# Patient Record
Sex: Female | Born: 1945
Health system: Southern US, Community
[De-identification: ages and names within clinical notes are randomized; demographics above are authoritative.]

## PROBLEM LIST (undated history)

## (undated) DIAGNOSIS — K649 Unspecified hemorrhoids: Secondary | ICD-10-CM

## (undated) DIAGNOSIS — T148XXA Other injury of unspecified body region, initial encounter: Secondary | ICD-10-CM

## (undated) DIAGNOSIS — M549 Dorsalgia, unspecified: Secondary | ICD-10-CM

## (undated) DIAGNOSIS — I10 Essential (primary) hypertension: Secondary | ICD-10-CM

## (undated) DIAGNOSIS — I639 Cerebral infarction, unspecified: Secondary | ICD-10-CM

## (undated) DIAGNOSIS — Z9889 Other specified postprocedural states: Secondary | ICD-10-CM

## (undated) DIAGNOSIS — E785 Hyperlipidemia, unspecified: Secondary | ICD-10-CM

## (undated) DIAGNOSIS — I959 Hypotension, unspecified: Secondary | ICD-10-CM

## (undated) DIAGNOSIS — R52 Pain, unspecified: Secondary | ICD-10-CM

## (undated) DIAGNOSIS — Z9289 Personal history of other medical treatment: Secondary | ICD-10-CM

## (undated) DIAGNOSIS — Z950 Presence of cardiac pacemaker: Secondary | ICD-10-CM

## (undated) DIAGNOSIS — S42309A Unspecified fracture of shaft of humerus, unspecified arm, initial encounter for closed fracture: Secondary | ICD-10-CM

## (undated) DIAGNOSIS — K8020 Calculus of gallbladder without cholecystitis without obstruction: Secondary | ICD-10-CM

## (undated) DIAGNOSIS — O24419 Gestational diabetes mellitus in pregnancy, unspecified control: Secondary | ICD-10-CM

## (undated) DIAGNOSIS — I447 Left bundle-branch block, unspecified: Secondary | ICD-10-CM

## (undated) HISTORY — DX: Hyperlipidemia, unspecified: E78.5

## (undated) HISTORY — DX: Unspecified hemorrhoids: K64.9

## (undated) HISTORY — PX: GALLBLADDER SURGERY: SHX652

## (undated) HISTORY — PX: COLONOSCOPY: SHX174

## (undated) HISTORY — DX: Essential (primary) hypertension: I10

## (undated) HISTORY — DX: Calculus of gallbladder without cholecystitis without obstruction: K80.20

---

## 2005-03-24 ENCOUNTER — Ambulatory Visit: Payer: Self-pay | Admitting: Obstetrics and Gynecology

## 2005-03-24 ENCOUNTER — Other Ambulatory Visit: Payer: Self-pay

## 2005-03-25 ENCOUNTER — Ambulatory Visit: Payer: Self-pay | Admitting: Obstetrics and Gynecology

## 2005-03-31 ENCOUNTER — Ambulatory Visit: Payer: Self-pay

## 2010-12-11 ENCOUNTER — Emergency Department (HOSPITAL_COMMUNITY)
Admission: EM | Admit: 2010-12-11 | Discharge: 2010-12-11 | Disposition: A | Discharge status: Home / Self Care | Payer: Medicare Other | Attending: Emergency Medicine | Admitting: Emergency Medicine

## 2010-12-11 DIAGNOSIS — I10 Essential (primary) hypertension: Secondary | ICD-10-CM | POA: Insufficient documentation

## 2010-12-11 DIAGNOSIS — H9209 Otalgia, unspecified ear: Secondary | ICD-10-CM | POA: Insufficient documentation

## 2010-12-11 DIAGNOSIS — K089 Disorder of teeth and supporting structures, unspecified: Secondary | ICD-10-CM | POA: Insufficient documentation

## 2013-05-30 ENCOUNTER — Ambulatory Visit (INDEPENDENT_AMBULATORY_CARE_PROVIDER_SITE_OTHER): Payer: Commercial Managed Care - HMO | Admitting: Cardiovascular Disease

## 2013-05-30 ENCOUNTER — Encounter: Payer: Self-pay | Admitting: Cardiovascular Disease

## 2013-05-30 VITALS — BP 132/78 | HR 74 | Ht 62.0 in | Wt 189.8 lb

## 2013-05-30 DIAGNOSIS — R079 Chest pain, unspecified: Secondary | ICD-10-CM | POA: Insufficient documentation

## 2013-05-30 DIAGNOSIS — R9431 Abnormal electrocardiogram [ECG] [EKG]: Secondary | ICD-10-CM | POA: Insufficient documentation

## 2013-05-30 DIAGNOSIS — R011 Cardiac murmur, unspecified: Secondary | ICD-10-CM

## 2013-05-30 DIAGNOSIS — I447 Left bundle-branch block, unspecified: Secondary | ICD-10-CM

## 2013-05-30 DIAGNOSIS — E785 Hyperlipidemia, unspecified: Secondary | ICD-10-CM

## 2013-05-30 DIAGNOSIS — I10 Essential (primary) hypertension: Secondary | ICD-10-CM | POA: Insufficient documentation

## 2013-05-30 NOTE — Patient Instructions (Addendum)
You are doing well. No medication changes were made.  We will schedule a lexiscan myoview for abn EKG and chest pain. Hold amlodipine the night before and morning of the test No food the morning of the test  Please call us if you have new issues that need to be addressed before your next appt.  We will call you with the results  [facility] MYOVIEW  Your caregiver has ordered a Stress Test with nuclear imaging. The purpose of this test is to evaluate the blood supply to your heart muscle. This procedure is referred to as a "Non-Invasive Stress Test." This is because other than having an IV started in your vein, nothing is inserted or "invades" your body. Cardiac stress tests are done to find areas of poor blood flow to the heart by determining the extent of coronary artery disease (CAD). Some patients exercise on a treadmill, which naturally increases the blood flow to your heart, while others who are  unable to walk on a treadmill due to physical limitations have a pharmacologic/chemical stress agent called Lexiscan . This medicine will mimic walking on a treadmill by temporarily increasing your coronary blood flow.   Please note: these test may take anywhere between 2-4 hours to complete  PLEASE REPORT TO [facility]TR[facility]  THE VOLUNTEERS AT THE FIRST DESK WILL DIRECT YOU WHERE TO GO  Date of Procedure:___________Tuesday, Nov 25__________________________  Arrival Time for Procedure:__________7:30____________________  Hold amlodipine the night before and morning of the test  PLEASE NOTIFY THE OFFICE AT LEAST 24 HOURS IN ADVANCE IF YOU ARE UNABLE TO KEEP YOUR APPOINTMENT.  [phone] AND  PLEASE NOTIFY NUCLEAR MEDICINE AT [facility] AT LEAST 24 HOURS IN ADVANCE IF YOU ARE UNABLE TO KEEP YOUR APPOINTMENT. [phone]  How to prepare for your Myoview test:  1. Do not eat or drink after midnight 2. No caffeine for 24 hours prior to test 3. No smoking 24 hours prior to test. 4. Your  medication may be taken with water.  If your doctor stopped a medication because of this test, do not take that medication. 5. Ladies, please do not wear dresses.  Skirts or pants are appropriate. Please wear a short sleeve shirt. 6. No perfume, cologne or lotion. 7. Wear comfortable walking shoes. No heels!

## 2013-05-30 NOTE — Progress Notes (Signed)
Patient ID: Elaine Ford, female    DOB: [birth year], 67 y.o.   MRN: [id number]  HPI Comments: Ms Ecklund is a pleasant 67 year old woman with history of hypertension, strong family history of CAD, presenting by referral for symptoms of chest pain.  She reports that for the past several months, she has been waking at nighttime with left-sided chest pain. It is a deep, burning pain lasting for several minutes at a time. Pain is 6/10 in intensity. Sometimes symptoms occurred during the daytime. Predominantly at night. She has a recliner/hospital bed. Sometimes if she lays flat, rolls on her side symptoms might have some relief. She's not particularly active at baseline but able to do her ADLs. No PND or orthopnea. No lightheadedness or dizziness.  She denies any smoking history, no diabetes She reports her father had coronary disease and bypass in his 22s, brother also with CAD  EKG done by Dr. Lahoma Rocker shows normal sinus rhythm with rate 72 beats per minute, left bundle branch block EKG today shows normal sinus rhythm with rate 74 beats per minute, intraventricular conduction delay, ST and T wave abnormality Prior EKG in September 2006 was essentially normal, narrow complex rhythm   Outpatient Encounter Prescriptions as of 05/30/2013  Medication Sig  . amLODipine (NORVASC) 5 MG tablet Take 5 mg by mouth daily.  Marland Kitchen aspirin 81 MG tablet Take 81 mg by mouth daily.  Marland Kitchen losartan-hydrochlorothiazide (HYZAAR) 100-25 MG per tablet Take 1 tablet by mouth daily.  Marland Kitchen lovastatin (MEVACOR) 20 MG tablet Take 20 mg by mouth at bedtime.  . nitroGLYCERIN (NITROSTAT) 0.4 MG SL tablet Place 0.4 mg under the tongue every 5 (five) minutes as needed for chest pain.  Marland Kitchen nystatin cream (MYCOSTATIN) Apply 1 application topically as needed.   Marland Kitchen PROCTOSOL HC 2.5 % rectal cream Place 1 application rectally as needed.      Review of Systems  Constitutional: Negative.   HENT: Negative.   Eyes: Negative.   Respiratory:  Negative.   Cardiovascular: Positive for chest pain.  Gastrointestinal: Negative.   Endocrine: Negative.   Musculoskeletal: Negative.   Skin: Negative.   Allergic/Immunologic: Negative.   Neurological: Negative.   Hematological: Negative.   Psychiatric/Behavioral: Negative.   All other systems reviewed and are negative.    BP 132/78  Pulse 74  Ht 5\' 2"  (1.575 m)  Wt 189 lb 12 oz (86.07 kg)  BMI 34.70 kg/m2  Physical Exam  Nursing note and vitals reviewed. Constitutional: She is oriented to person, place, and time. She appears well-developed and well-nourished.  HENT:  Head: Normocephalic.  Nose: Nose normal.  Mouth/Throat: Oropharynx is clear and moist.  Eyes: Conjunctivae are normal. Pupils are equal, round, and reactive to light.  Neck: Normal range of motion. Neck supple. No JVD present.  Cardiovascular: Normal rate, regular rhythm, S1 normal, S2 normal and intact distal pulses.  Exam reveals no gallop and no friction rub.   Murmur heard.  Systolic murmur is present with a grade of 2/6  Pulmonary/Chest: Effort normal and breath sounds normal. No respiratory distress. She has no wheezes. She has no rales. She exhibits no tenderness.  Abdominal: Soft. Bowel sounds are normal. She exhibits no distension. There is no tenderness.  Musculoskeletal: Normal range of motion. She exhibits no edema and no tenderness.  Lymphadenopathy:    She has no cervical adenopathy.  Neurological: She is alert and oriented to person, place, and time. Coordination normal.  Skin: Skin is warm and dry. No rash noted. No  erythema.  Psychiatric: She has a normal mood and affect. Her behavior is normal. Judgment and thought content normal.    Assessment and Plan

## 2013-05-30 NOTE — Assessment & Plan Note (Signed)
Encouraged her to stay on her statin 

## 2013-05-30 NOTE — Assessment & Plan Note (Signed)
Etiology of her chest pain is unclear. She's not a good candidate for treadmill testing given underlying intraventricular conduction delay/left bundle branch block seen on EKG. Given this, pharmacologic Myoview will be ordered

## 2013-05-30 NOTE — Assessment & Plan Note (Signed)
Blood pressure is well controlled on today's visit. No changes made to the medications. 

## 2013-05-30 NOTE — Assessment & Plan Note (Signed)
EKG a primary care showed up on the right block. Our EKG today shows intraventricular conduction delay.

## 2013-05-30 NOTE — Assessment & Plan Note (Signed)
Suspect she has underlying aortic valve sclerosis. Would monitor for now. If murmur gets louder, echocardiogram could be ordered

## 2013-06-04 ENCOUNTER — Ambulatory Visit: Payer: Self-pay | Admitting: Cardiovascular Disease

## 2013-06-04 DIAGNOSIS — R079 Chest pain, unspecified: Secondary | ICD-10-CM

## 2013-06-14 ENCOUNTER — Telehealth: Payer: Self-pay

## 2013-06-14 NOTE — Telephone Encounter (Signed)
Message copied by Marilynne Halsted on Fri Jun 14, 2013 11:07 AM ------      Message from: Antonieta Iba      Created: Thu Jun 13, 2013 11:14 PM       Normal stress test ------

## 2013-06-14 NOTE — Telephone Encounter (Signed)
Left detailed message on pt's voicemail w/ results. Asked pt to call with any questions or concerns.

## 2013-06-26 ENCOUNTER — Encounter: Payer: Self-pay | Admitting: *Deleted

## 2014-01-22 ENCOUNTER — Ambulatory Visit (INDEPENDENT_AMBULATORY_CARE_PROVIDER_SITE_OTHER): Payer: Commercial Managed Care - HMO | Admitting: Cardiovascular Disease

## 2014-01-22 ENCOUNTER — Encounter: Payer: Self-pay | Admitting: Cardiovascular Disease

## 2014-01-22 VITALS — BP 140/80 | HR 59 | Ht 64.0 in | Wt 193.2 lb

## 2014-01-22 DIAGNOSIS — E669 Obesity, unspecified: Secondary | ICD-10-CM

## 2014-01-22 DIAGNOSIS — E785 Hyperlipidemia, unspecified: Secondary | ICD-10-CM

## 2014-01-22 DIAGNOSIS — I10 Essential (primary) hypertension: Secondary | ICD-10-CM | POA: Diagnosis not present

## 2014-01-22 DIAGNOSIS — R011 Cardiac murmur, unspecified: Secondary | ICD-10-CM | POA: Diagnosis not present

## 2014-01-22 DIAGNOSIS — I447 Left bundle-branch block, unspecified: Secondary | ICD-10-CM | POA: Diagnosis not present

## 2014-01-22 NOTE — Assessment & Plan Note (Signed)
Currently not on lovastatin. Recommended she follow up with primary care with annual lab work

## 2014-01-22 NOTE — Progress Notes (Addendum)
   Patient ID: Elaine Ford, female    DOB: 1946-01-21, 68 y.o.   MRN: [id number]  HPI Comments: Elaine Ford is a pleasant 68 year old woman with history of hypertension, obesity, strong family history of CAD, previous symptoms of chest pain who presents for routine followup.  In followup today, she reports that many of her medications have been changed, but she is taking atenolol 25 mg daily. Several of the medications were held secondary to headache .She was previously on amlodipine and losartan HCTZ. Also on lovastatin. Now no longer taking these. She does not check her blood pressure. She tries to stay active, but does not do any regular exercise. Weight has been a major issue. She denies any significant chest pain or shortness of breath with exertion. Overall she feels well  No PND or orthopnea. No lightheadedness or dizziness.  She denies smoking.  She reports her father had coronary disease and bypass in his 97s, brother also with CAD  EKG today shows normal sinus rhythm with rate 59 beats per minute, left bundle branch block    Outpatient Encounter Prescriptions as of 01/22/2014  Medication Sig  . aspirin 81 MG tablet Take 81 mg by mouth daily.  Marland Kitchen atenolol (TENORMIN) 25 MG tablet Take 25 mg by mouth daily.   . nitroGLYCERIN (NITROSTAT) 0.4 MG SL tablet Place 0.4 mg under the tongue every 5 (five) minutes as needed for chest pain.    Review of Systems  Constitutional: Negative.   HENT: Negative.   Eyes: Negative.   Respiratory: Negative.   Cardiovascular: Negative.   Gastrointestinal: Negative.   Endocrine: Negative.   Musculoskeletal: Negative.   Skin: Negative.   Allergic/Immunologic: Negative.   Neurological: Negative.   Hematological: Negative.   Psychiatric/Behavioral: Negative.   All other systems reviewed and are negative.   BP 140/80  Pulse 59  Ht 5\' 4"  (1.626 m)  Wt 193 lb 4 oz (87.658 kg)  BMI 33.16 kg/m2  Physical Exam  Nursing note and vitals  reviewed. Constitutional: She is oriented to person, place, and time. She appears well-developed and well-nourished.  HENT:  Head: Normocephalic.  Nose: Nose normal.  Mouth/Throat: Oropharynx is clear and moist.  Eyes: Conjunctivae are normal. Pupils are equal, round, and reactive to light.  Neck: Normal range of motion. Neck supple. No JVD present.  Cardiovascular: Normal rate, regular rhythm, S1 normal, S2 normal and intact distal pulses.  Exam reveals no gallop and no friction rub.   Murmur heard.  Systolic murmur is present with a grade of 2/6  Pulmonary/Chest: Effort normal and breath sounds normal. No respiratory distress. She has no wheezes. She has no rales. She exhibits no tenderness.  Abdominal: Soft. Bowel sounds are normal. She exhibits no distension. There is no tenderness.  Musculoskeletal: Normal range of motion. She exhibits no edema and no tenderness.  Lymphadenopathy:    She has no cervical adenopathy.  Neurological: She is alert and oriented to person, place, and time. Coordination normal.  Skin: Skin is warm and dry. No rash noted. No erythema.  Psychiatric: She has a normal mood and affect. Her behavior is normal. Judgment and thought content normal.    Assessment and Plan

## 2014-01-22 NOTE — Assessment & Plan Note (Signed)
Likely has aortic valve sclerosis. If murmur gets louder, we'll order echocardiogram

## 2014-01-22 NOTE — Patient Instructions (Signed)
You are doing well. No medication changes were made.  Please start a walking program   Please call us if you have new issues that need to be addressed before your next appt.  Your physician wants you to follow-up in: 75months.  You will receive a reminder letter in the mail two months in advance. If you don't receive a letter, please call our office to schedule the follow-up appointment.

## 2014-01-22 NOTE — Assessment & Plan Note (Signed)
Prior stress test at the end of 2014 showing no ischemia. No further workup needed at this time

## 2014-01-22 NOTE — Assessment & Plan Note (Signed)
Recommended she closely monitor her diet, start a regular walking program. She reports that she has access to a gym membership

## 2014-01-22 NOTE — Assessment & Plan Note (Addendum)
Blood pressure borderline elevated. We have suggested she start to monitor her blood pressure closely at home and call our office if this runs high. Parameters given for blood pressure control. Recommended diet and exercise

## 2014-07-11 DIAGNOSIS — Z950 Presence of cardiac pacemaker: Secondary | ICD-10-CM

## 2014-07-11 HISTORY — DX: Presence of cardiac pacemaker: Z95.0

## 2014-07-22 ENCOUNTER — Ambulatory Visit: Payer: Self-pay

## 2014-07-22 ENCOUNTER — Inpatient Hospital Stay: Payer: Self-pay | Admitting: Internal Medicine

## 2014-07-22 DIAGNOSIS — I252 Old myocardial infarction: Secondary | ICD-10-CM | POA: Diagnosis not present

## 2014-07-22 DIAGNOSIS — R740 Nonspecific elevation of levels of transaminase and lactic acid dehydrogenase [LDH]: Secondary | ICD-10-CM | POA: Diagnosis not present

## 2014-07-22 DIAGNOSIS — I447 Left bundle-branch block, unspecified: Secondary | ICD-10-CM | POA: Diagnosis not present

## 2014-07-22 DIAGNOSIS — R079 Chest pain, unspecified: Secondary | ICD-10-CM | POA: Diagnosis not present

## 2014-07-22 DIAGNOSIS — I442 Atrioventricular block, complete: Secondary | ICD-10-CM | POA: Diagnosis not present

## 2014-07-22 DIAGNOSIS — R0602 Shortness of breath: Secondary | ICD-10-CM | POA: Diagnosis not present

## 2014-07-22 DIAGNOSIS — I10 Essential (primary) hypertension: Secondary | ICD-10-CM | POA: Diagnosis not present

## 2014-07-22 DIAGNOSIS — F419 Anxiety disorder, unspecified: Secondary | ICD-10-CM | POA: Diagnosis not present

## 2014-07-22 DIAGNOSIS — R001 Bradycardia, unspecified: Secondary | ICD-10-CM | POA: Diagnosis not present

## 2014-07-22 DIAGNOSIS — I351 Nonrheumatic aortic (valve) insufficiency: Secondary | ICD-10-CM | POA: Diagnosis not present

## 2014-07-22 DIAGNOSIS — Z7982 Long term (current) use of aspirin: Secondary | ICD-10-CM | POA: Diagnosis not present

## 2014-07-22 DIAGNOSIS — Z9049 Acquired absence of other specified parts of digestive tract: Secondary | ICD-10-CM | POA: Diagnosis not present

## 2014-07-22 DIAGNOSIS — M179 Osteoarthritis of knee, unspecified: Secondary | ICD-10-CM | POA: Diagnosis not present

## 2014-07-22 DIAGNOSIS — R0789 Other chest pain: Secondary | ICD-10-CM | POA: Diagnosis not present

## 2014-07-22 DIAGNOSIS — Z79899 Other long term (current) drug therapy: Secondary | ICD-10-CM | POA: Diagnosis not present

## 2014-07-22 DIAGNOSIS — R9431 Abnormal electrocardiogram [ECG] [EKG]: Secondary | ICD-10-CM | POA: Diagnosis not present

## 2014-07-22 LAB — CBC WITH DIFFERENTIAL/PLATELET
BASOS ABS: 0.1 10*3/uL (ref 0.0–0.1)
Basophil %: 0.6 %
Eosinophil #: 0 10*3/uL (ref 0.0–0.7)
Eosinophil %: 0.2 %
HCT: 43.2 % (ref 35.0–47.0)
HGB: 14 g/dL (ref 12.0–16.0)
LYMPHS ABS: 1.6 10*3/uL (ref 1.0–3.6)
LYMPHS PCT: 15.6 %
MCH: 27.5 pg (ref 26.0–34.0)
MCHC: 32.5 g/dL (ref 32.0–36.0)
MCV: 85 fL (ref 80–100)
MONO ABS: 0.5 x10 3/mm (ref 0.2–0.9)
Monocyte %: 5.2 %
Neutrophil #: 7.9 10*3/uL — ABNORMAL HIGH (ref 1.4–6.5)
Neutrophil %: 78.4 %
Platelet: 261 10*3/uL (ref 150–440)
RBC: 5.12 10*6/uL (ref 3.80–5.20)
RDW: 14.1 % (ref 11.5–14.5)
WBC: 10.1 10*3/uL (ref 3.6–11.0)

## 2014-07-22 LAB — LIPASE, BLOOD: Lipase: 62 U/L — ABNORMAL LOW (ref 73–393)

## 2014-07-22 LAB — COMPREHENSIVE METABOLIC PANEL
ALK PHOS: 83 U/L
ALT: 123 U/L — AB
Albumin: 3.1 g/dL — ABNORMAL LOW (ref 3.4–5.0)
Anion Gap: 11 (ref 7–16)
BILIRUBIN TOTAL: 0.7 mg/dL (ref 0.2–1.0)
BUN: 11 mg/dL (ref 7–18)
CHLORIDE: 105 mmol/L (ref 98–107)
Calcium, Total: 8.8 mg/dL (ref 8.5–10.1)
Co2: 22 mmol/L (ref 21–32)
Creatinine: 0.87 mg/dL (ref 0.60–1.30)
EGFR (African American): >60
EGFR (Non-African Amer.): >60
GLUCOSE: 119 mg/dL — AB (ref 65–99)
OSMOLALITY: 276 (ref 275–301)
Potassium: 4.4 mmol/L (ref 3.5–5.1)
SGOT(AST): 89 U/L — ABNORMAL HIGH (ref 15–37)
SODIUM: 138 mmol/L (ref 136–145)
TOTAL PROTEIN: 7.8 g/dL (ref 6.4–8.2)

## 2014-07-22 LAB — CK TOTAL AND CKMB (NOT AT ARMC)
CK, Total: 87 U/L (ref 26–192)
CK, Total: 51 U/L (ref 26–192)
CK-MB: 0.7 ng/mL (ref 0.5–3.6)
CK-MB: 0.7 ng/mL (ref 0.5–3.6)

## 2014-07-22 LAB — TROPONIN I: Troponin-I: <0.02 ng/mL

## 2014-07-22 LAB — URINALYSIS, COMPLETE
BILIRUBIN, UR: NEGATIVE
Blood: NEGATIVE
GLUCOSE, UR: NEGATIVE mg/dL (ref 0–75)
KETONE: NEGATIVE
Leukocyte Esterase: NEGATIVE
Nitrite: NEGATIVE
PROTEIN: NEGATIVE
Ph: 8 (ref 4.5–8.0)
Specific Gravity: 1.005 (ref 1.003–1.030)
Squamous Epithelial: 1

## 2014-07-22 LAB — APTT: ACTIVATED PTT: 31.7 s (ref 23.6–35.9)

## 2014-07-22 LAB — TSH: Thyroid Stimulating Horm: 3.43 u[IU]/mL

## 2014-07-22 LAB — MAGNESIUM: Magnesium: 2.1 mg/dL

## 2014-07-22 LAB — ETHANOL: Ethanol: <3 mg/dL

## 2014-07-22 LAB — HEMOGLOBIN A1C: Hemoglobin A1C: 5.6 % (ref 4.2–6.3)

## 2014-07-22 LAB — PROTIME-INR
INR: 1.1
PROTHROMBIN TIME: 13.5 s (ref 11.5–14.7)

## 2014-07-23 DIAGNOSIS — I10 Essential (primary) hypertension: Secondary | ICD-10-CM

## 2014-07-23 DIAGNOSIS — I351 Nonrheumatic aortic (valve) insufficiency: Secondary | ICD-10-CM

## 2014-07-23 DIAGNOSIS — R001 Bradycardia, unspecified: Secondary | ICD-10-CM

## 2014-07-23 LAB — BASIC METABOLIC PANEL
ANION GAP: 9 (ref 7–16)
BUN: 12 mg/dL (ref 7–18)
CALCIUM: 8.6 mg/dL (ref 8.5–10.1)
CHLORIDE: 106 mmol/L (ref 98–107)
CO2: 26 mmol/L (ref 21–32)
CREATININE: 0.87 mg/dL (ref 0.60–1.30)
EGFR (African American): >60
EGFR (Non-African Amer.): >60
Glucose: 87 mg/dL (ref 65–99)
OSMOLALITY: 280 (ref 275–301)
Potassium: 3.9 mmol/L (ref 3.5–5.1)
Sodium: 141 mmol/L (ref 136–145)

## 2014-07-23 LAB — CBC WITH DIFFERENTIAL/PLATELET
BASOS ABS: 0.1 10*3/uL (ref 0.0–0.1)
BASOS PCT: 0.6 %
EOS ABS: 0 10*3/uL (ref 0.0–0.7)
Eosinophil %: 0.5 %
HCT: 42.7 % (ref 35.0–47.0)
HGB: 13.6 g/dL (ref 12.0–16.0)
Lymphocyte #: 1.9 10*3/uL (ref 1.0–3.6)
Lymphocyte %: 21.5 %
MCH: 27.2 pg (ref 26.0–34.0)
MCHC: 31.9 g/dL — AB (ref 32.0–36.0)
MCV: 85 fL (ref 80–100)
Monocyte #: 0.5 x10 3/mm (ref 0.2–0.9)
Monocyte %: 6 %
NEUTROS PCT: 71.4 %
Neutrophil #: 6.4 10*3/uL (ref 1.4–6.5)
Platelet: 246 10*3/uL (ref 150–440)
RBC: 5.01 10*6/uL (ref 3.80–5.20)
RDW: 14.2 % (ref 11.5–14.5)
WBC: 8.9 10*3/uL (ref 3.6–11.0)

## 2014-07-23 LAB — TROPONIN I: Troponin-I: <0.02 ng/mL

## 2014-07-23 LAB — CK TOTAL AND CKMB (NOT AT ARMC)
CK, Total: 46 U/L (ref 26–192)
CK-MB: 0.7 ng/mL (ref 0.5–3.6)

## 2014-07-23 LAB — LIPID PANEL
Cholesterol: 147 mg/dL (ref 0–200)
HDL: 38 mg/dL — AB (ref 40–60)
Ldl Cholesterol, Calc: 84 mg/dL (ref 0–100)
Triglycerides: 127 mg/dL (ref 0–200)
VLDL Cholesterol, Calc: 25 mg/dL (ref 5–40)

## 2014-07-24 ENCOUNTER — Encounter (HOSPITAL_COMMUNITY)
Admission: EM | Disposition: A | Discharge status: Home / Self Care | Payer: Self-pay | Source: Other Acute Inpatient Hospital | Attending: Internal Medicine

## 2014-07-24 ENCOUNTER — Inpatient Hospital Stay (HOSPITAL_COMMUNITY)
Admission: EM | Admit: 2014-07-24 | Discharge: 2014-07-27 | DRG: 243 | Disposition: A | Discharge status: Home / Self Care | Payer: Commercial Managed Care - HMO | Source: Other Acute Inpatient Hospital | Attending: Internal Medicine | Admitting: Internal Medicine

## 2014-07-24 ENCOUNTER — Encounter (HOSPITAL_COMMUNITY): Payer: Self-pay | Admitting: *Deleted

## 2014-07-24 DIAGNOSIS — Z7982 Long term (current) use of aspirin: Secondary | ICD-10-CM

## 2014-07-24 DIAGNOSIS — E785 Hyperlipidemia, unspecified: Secondary | ICD-10-CM | POA: Diagnosis present

## 2014-07-24 DIAGNOSIS — T82120A Displacement of cardiac electrode, initial encounter: Secondary | ICD-10-CM | POA: Diagnosis not present

## 2014-07-24 DIAGNOSIS — I447 Left bundle-branch block, unspecified: Secondary | ICD-10-CM | POA: Diagnosis present

## 2014-07-24 DIAGNOSIS — I10 Essential (primary) hypertension: Secondary | ICD-10-CM | POA: Diagnosis not present

## 2014-07-24 DIAGNOSIS — I442 Atrioventricular block, complete: Secondary | ICD-10-CM | POA: Diagnosis not present

## 2014-07-24 DIAGNOSIS — Z6836 Body mass index (BMI) 36.0-36.9, adult: Secondary | ICD-10-CM | POA: Diagnosis not present

## 2014-07-24 DIAGNOSIS — R001 Bradycardia, unspecified: Secondary | ICD-10-CM | POA: Diagnosis not present

## 2014-07-24 DIAGNOSIS — Z8249 Family history of ischemic heart disease and other diseases of the circulatory system: Secondary | ICD-10-CM

## 2014-07-24 DIAGNOSIS — T82110A Breakdown (mechanical) of cardiac electrode, initial encounter: Secondary | ICD-10-CM | POA: Diagnosis not present

## 2014-07-24 DIAGNOSIS — Y838 Other surgical procedures as the cause of abnormal reaction of the patient, or of later complication, without mention of misadventure at the time of the procedure: Secondary | ICD-10-CM | POA: Diagnosis not present

## 2014-07-24 DIAGNOSIS — T82190A Other mechanical complication of cardiac electrode, initial encounter: Secondary | ICD-10-CM | POA: Diagnosis not present

## 2014-07-24 DIAGNOSIS — I209 Angina pectoris, unspecified: Secondary | ICD-10-CM

## 2014-07-24 DIAGNOSIS — Z452 Encounter for adjustment and management of vascular access device: Secondary | ICD-10-CM | POA: Diagnosis not present

## 2014-07-24 DIAGNOSIS — Z4502 Encounter for adjustment and management of automatic implantable cardiac defibrillator: Secondary | ICD-10-CM | POA: Diagnosis not present

## 2014-07-24 DIAGNOSIS — Z959 Presence of cardiac and vascular implant and graft, unspecified: Secondary | ICD-10-CM

## 2014-07-24 DIAGNOSIS — Z950 Presence of cardiac pacemaker: Secondary | ICD-10-CM

## 2014-07-24 HISTORY — DX: Left bundle-branch block, unspecified: I44.7

## 2014-07-24 HISTORY — DX: Personal history of other medical treatment: Z92.89

## 2014-07-24 HISTORY — DX: Other specified postprocedural states: Z98.890

## 2014-07-24 HISTORY — DX: Presence of cardiac pacemaker: Z95.0

## 2014-07-24 HISTORY — PX: PERMANENT PACEMAKER INSERTION: SHX5480

## 2014-07-24 LAB — CBC
HCT: 40.6 % (ref 36.0–46.0)
Hemoglobin: 13.5 g/dL (ref 12.0–15.0)
MCH: 27.5 pg (ref 26.0–34.0)
MCHC: 33.3 g/dL (ref 30.0–36.0)
MCV: 82.7 fL (ref 78.0–100.0)
Platelets: 247 10*3/uL (ref 150–400)
RBC: 4.91 MIL/uL (ref 3.87–5.11)
RDW: 13.9 % (ref 11.5–15.5)
WBC: 10.5 10*3/uL (ref 4.0–10.5)

## 2014-07-24 LAB — CREATININE, SERUM
CREATININE: 0.81 mg/dL (ref 0.50–1.10)
GFR calc Af Amer: 85 mL/min — ABNORMAL LOW (ref >90)
GFR, EST NON AFRICAN AMERICAN: 73 mL/min — AB (ref >90)

## 2014-07-24 LAB — MRSA PCR SCREENING: MRSA by PCR: NEGATIVE

## 2014-07-24 SURGERY — PERMANENT PACEMAKER INSERTION
Anesthesia: LOCAL

## 2014-07-24 MED ORDER — CHLORHEXIDINE GLUCONATE 4 % EX LIQD
60.0000 mL | Freq: Once | CUTANEOUS | Status: AC
  Administered 2014-07-24: 4 via TOPICAL

## 2014-07-24 MED ORDER — ONDANSETRON HCL 4 MG/2ML IJ SOLN
4.0000 mg | Freq: Four times a day (QID) | INTRAMUSCULAR | Status: DC | PRN
Start: 2014-07-24 — End: 2014-07-24

## 2014-07-24 MED ORDER — ONDANSETRON HCL 4 MG/2ML IJ SOLN: 4.0000 mg | Freq: Four times a day (QID) | INTRAMUSCULAR | Status: DC | PRN

## 2014-07-24 MED ORDER — SODIUM CHLORIDE 0.9 % IJ SOLN: 3.0000 mL | INTRAMUSCULAR | Status: DC | PRN

## 2014-07-24 MED ORDER — ASPIRIN EC 81 MG PO TBEC
81.0000 mg | DELAYED_RELEASE_TABLET | Freq: Every day | ORAL | Status: DC
  Administered 2014-07-25 – 2014-07-27 (×3): 81 mg via ORAL
  Filled 2014-07-24 (×3): qty 1

## 2014-07-24 MED ORDER — CEFAZOLIN SODIUM 1-5 GM-% IV SOLN
1.0000 g | Freq: Four times a day (QID) | INTRAVENOUS | Status: AC
  Administered 2014-07-24 – 2014-07-25 (×3): 1 g via INTRAVENOUS
  Filled 2014-07-24 (×3): qty 50

## 2014-07-24 MED ORDER — CEFAZOLIN SODIUM-DEXTROSE 2-3 GM-% IV SOLR
2.0000 g | INTRAVENOUS | Status: AC
  Administered 2014-07-24: 2 g via INTRAVENOUS

## 2014-07-24 MED ORDER — SODIUM CHLORIDE 0.9 % IV SOLN: 250.0000 mL | INTRAVENOUS | Status: DC | PRN

## 2014-07-24 MED ORDER — SODIUM CHLORIDE 0.9 % IR SOLN
80.0000 mg | Status: AC
  Administered 2014-07-24: 80 mg

## 2014-07-24 MED ORDER — NITROGLYCERIN 0.4 MG SL SUBL
0.4000 mg | SUBLINGUAL_TABLET | SUBLINGUAL | Status: DC | PRN
Start: 2014-07-24 — End: 2014-07-24

## 2014-07-24 MED ORDER — HEPARIN SODIUM (PORCINE) 5000 UNIT/ML IJ SOLN
5000.0000 [IU] | Freq: Three times a day (TID) | INTRAMUSCULAR | Status: DC
  Administered 2014-07-24 – 2014-07-27 (×6): 5000 [IU] via SUBCUTANEOUS
  Filled 2014-07-24 (×11): qty 1

## 2014-07-24 MED ORDER — SODIUM CHLORIDE 0.9 % IJ SOLN
3.0000 mL | Freq: Two times a day (BID) | INTRAMUSCULAR | Status: DC
  Administered 2014-07-26 (×2): 3 mL via INTRAVENOUS

## 2014-07-24 MED ORDER — ATORVASTATIN CALCIUM 40 MG PO TABS
40.0000 mg | ORAL_TABLET | Freq: Every day | ORAL | Status: DC
  Administered 2014-07-24 – 2014-07-26 (×3): 40 mg via ORAL
  Filled 2014-07-24 (×4): qty 1

## 2014-07-24 MED ORDER — HEPARIN (PORCINE) IN NACL 2-0.9 UNIT/ML-% IJ SOLN
INTRAMUSCULAR | Status: AC
  Filled 2014-07-24: qty 500

## 2014-07-24 MED ORDER — SODIUM CHLORIDE 0.9 % IR SOLN
Status: DC
  Filled 2014-07-24: qty 2

## 2014-07-24 MED ORDER — ACETAMINOPHEN 325 MG PO TABS: 325.0000 mg | ORAL_TABLET | ORAL | Status: DC | PRN

## 2014-07-24 MED ORDER — FENTANYL CITRATE 0.05 MG/ML IJ SOLN
INTRAMUSCULAR | Status: AC
  Filled 2014-07-24: qty 2

## 2014-07-24 MED ORDER — MIDAZOLAM HCL 5 MG/5ML IJ SOLN
INTRAMUSCULAR | Status: AC
  Filled 2014-07-24: qty 5

## 2014-07-24 MED ORDER — ACETAMINOPHEN 325 MG PO TABS
650.0000 mg | ORAL_TABLET | ORAL | Status: DC | PRN
  Administered 2014-07-24 – 2014-07-26 (×3): 650 mg via ORAL
  Filled 2014-07-24 (×3): qty 2

## 2014-07-24 MED ORDER — NITROGLYCERIN 0.4 MG SL SUBL: 0.4000 mg | SUBLINGUAL_TABLET | SUBLINGUAL | Status: DC | PRN

## 2014-07-24 MED ORDER — SODIUM CHLORIDE 0.9 % IV SOLN
INTRAVENOUS | Status: DC
  Administered 2014-07-24 – 2014-07-25 (×2): via INTRAVENOUS

## 2014-07-24 MED ORDER — CARVEDILOL 12.5 MG PO TABS
12.5000 mg | ORAL_TABLET | Freq: Two times a day (BID) | ORAL | Status: DC
  Administered 2014-07-24 – 2014-07-26 (×4): 12.5 mg via ORAL
  Filled 2014-07-24 (×6): qty 1

## 2014-07-24 MED ORDER — CEFAZOLIN SODIUM-DEXTROSE 2-3 GM-% IV SOLR
INTRAVENOUS | Status: AC
  Filled 2014-07-24: qty 50

## 2014-07-24 MED ORDER — HYDRALAZINE HCL 20 MG/ML IJ SOLN
10.0000 mg | Freq: Once | INTRAMUSCULAR | Status: AC
  Administered 2014-07-24: 10 mg via INTRAVENOUS
  Filled 2014-07-24: qty 1

## 2014-07-24 MED ORDER — LIDOCAINE HCL (PF) 1 % IJ SOLN
INTRAMUSCULAR | Status: AC
  Filled 2014-07-24: qty 30

## 2014-07-24 NOTE — Consult Note (Addendum)
ELECTROPHYSIOLOGY CONSULT NOTE    Patient ID: Elaine Ford MRN: [id number], DOB/AGE: 1945-10-17 69 y.o.  Date of Consult: 07/24/2014  Primary Physician: LADA, Satira Anis, MD Primary Cardiologist: Rockey Situ  Reason for Consultation: heart block  HPI:  Elaine Ford is a 69 y.o. female with a past medical history significant for hypertension, hyperlipidemia, obesity, and LBBB.  She presented to [facility] on 07-21-14 with a 4 day history of intermittent fatigue, chest pain, and headaches. She was found to be in 2:1 AV block with ventricular rates in the 40's.  Home medications included Atenolol which has been held for >48 hours without improvement in heart rates.   Cardiac catheterization at [facility] demonstrated normal coronaries with normal EF.  Echocardiogram 07-23-14 demonstrated EF 65-70%, mildly dilated LA, mild MR, mild AR.  She currently denies recent fevers, chills, night sweats, orthopnea, palpitations, shortness of breath, cough, nausea or vomiting.  She has persistent fatigue and chest pain along with headache.   Lab work including TSH is reviewed and unremarkable for reversible causes of AV block.   She has been transferred to [facility] for EP evaluation.  ROS is negative except as outlined above.  Her first language is Spanish, but she understands some [place]. She requests an interpreter for physician conversations.    Past Medical History  Diagnosis Date  . Hypertension   . Hyperlipidemia   . Hemorrhoid   . Gall bladder stones      Surgical History:  Past Surgical History  Procedure Laterality Date  . Gallbladder surgery       Prescriptions prior to admission  Medication Sig Dispense Refill Last Dose  . aspirin 81 MG tablet Take 81 mg by mouth daily.   Taking  . atenolol (TENORMIN) 25 MG tablet Take 25 mg by mouth daily.    Taking  . nitroGLYCERIN (NITROSTAT) 0.4 MG SL tablet Place 0.4 mg under the tongue every 5 (five) minutes as needed for chest pain.   Taking     Inpatient Medications:   Allergies:  Allergies  Allergen Reactions  . Codeine     History   Social History  . Marital Status: Married    Spouse Name: N/A    Number of Children: N/A  . Years of Education: N/A   Occupational History  . Not on file.   Social History Main Topics  . Smoking status: Never Smoker   . Smokeless tobacco: Not on file  . Alcohol Use: No  . Drug Use: No  . Sexual Activity: Not on file   Other Topics Concern  . Not on file   Social History Narrative  . No narrative on file     Family History  Problem Relation Age of Onset  . Heart attack Father   . Heart disease Brother     Physical Exam Well appearing 69 yo woman, looking younger than stated age, NAD HEENT: Unremarkable,[place], AT Neck:  7 cm JVD, no thyromegally Back:  No CVA tenderness Lungs:  Clear with no wheezes, rales, or rhonchi HEART:  Regular rate rhythm, no murmurs, no rubs, no clicks Abd:  Soft, obese positive bowel sounds, no organomegally, no rebound, no guarding Ext:  2 plus pulses, no,edema, no cyanosis, no clubbing Skin:  No rashes no nodules Neuro:  CN II through XII intact, motor grossly intact    Labs: See paper chart, labs reviewed from [facility]   Radiology/Studies: see paper chart from [facility]  DJT:TSVXB brady with CHB, ventricular rate 34  TELEMETRY:  2:1 heart block, ventricular rate 34  A/P 1. Symptomatic 2:1 AV block 2. LBBB 3. HTN - she may go back on her beta blocker after PPM 4. Dyslipidemia - she needs to lose weight. Rec: I have discussed the treatment options with the patient and her husband via the interpreter. The risk/benefits/goals/expectations of PPM insertion have been reviewed and she wishes to proceed.  Mikle Bosworth.D.

## 2014-07-24 NOTE — Care Management Note (Signed)
    Page 1 of 1   07/24/2014     2:12:31 PM CARE MANAGEMENT NOTE 07/24/2014  Patient:  Elaine Ford, Elaine Ford   Account Number:  [id number]  Date Initiated:  07/24/2014  Documentation initiated by:  Elissa Hefty  Subjective/Objective Assessment:   adm w heart block and bradycardia     Action/Plan:   lives w husband, pcp dr Cinda Quest lada   Anticipated DC Date:     Anticipated DC Plan:  HOME/SELF CARE         Choice offered to / List presented to:             Status of service:   Medicare Important Message given?   (If response is "NO", the following Medicare IM given date fields will be blank) Date Medicare IM given:   Medicare IM given by:   Date Additional Medicare IM given:   Additional Medicare IM given by:    Discharge Disposition:    Per UR Regulation:  Reviewed for med. necessity/level of care/duration of stay  If discussed at [place] of Stay Meetings, dates discussed:    Comments:

## 2014-07-24 NOTE — H&P (Signed)
History and Physical  Patient ID: Elaine Ford MRN: [id number], DOB: 10-19-1945 Date of Encounter: 08/14/14, 10:57 AM Primary Physician: LADA, Satira Anis, MD Primary Cardiologist: Dr. Rockey Situ, MD  Chief Complaint: Fatigue, malaise, headache, and chest pain x 4 days Reason for Admission: Complete heart block with junctional escape, normal coronaries on cardiac cath 2014-08-14, HTN  HPI: 69 y.o. female with h/o HTN, HLD, LBBB, and morbid obesity who presented to [facility] on 07/22/14 with 4 day history of increased fatigue, intermittent chest pain, and headaches, on atenolol 25 mg and aspirin 81 mg daily, who was found to have bradycardia with rate of 30 and hypertension of 170s-200s/50s upon her admission.   Patient was initially seen by Dr. Rockey Situ in 05/2013 in with several month history of intermittent chest pain at that time. She has significant family history of CAD with a father and brother that have both had CAD s/p MI/CABG. She underwent a Lexiscan Myoview at that time that showed no significant ischemia, EF 69%. She was on Norvasc, lovastatin, and Hyzaar at that time. She did not follow up until 01/2014. At that time several of her medications had been changed by outside provider. She was now on atenolol, no longer on Hyzaar, Norvasc, or lovastatin. Pulse was 59, EKG was with LBBB at that time. She was doing well at that time.  Over the past 4 days she has noted increased fatigue, headaches and intermittent chest pains. She checks her BP daily at home and over the past 4 days her home BP cuff has not been able to get a pulse reading. Her BP readings were elevated, but she cannot reacall the readings. She continued to take both her aspirin 81 mg and atenolol 25 mg daily. She denies taking any extra atenolol. She called her PCP but they could not see her. She presented to a local urgent care nad was found to have a pulse of 30. EKG with AV dissociasion. She was sent to [facility] for further evaluation.    Upon her arrival to [facility] she was found to be bradycardic with HR in the 30s, BP initially in the 170s, followed by 200s/50s. Pacer pads in place. K+ 4.4-->3.9. Troponin negative. She was given HCTZ 25 mg by IM and enalapril 1.25 mg IV. Though want to keep SBP above 160 for perfusion given her bradycardia. She is asymptomatic currently and resting comfortably in the bed. Atrial rate is in the 60s with a ventricular rate in the 30s currently. EKG with 2:1 AV block.   This morning on telemetry she was found to have intermittent episodes of complete heart block on the monitor. Currently in 2:1 AVB with rates 20-40s. Pacer pads in place. She underwent diagnostic cardiac cath 08-14-2022 that showed normal coronary arteries. See detailed report below. Blood pressures remain stable around 892 systolic.   Past Medical History  Diagnosis Date  . Hypertension   . Hyperlipidemia   . Hemorrhoid   . Gall bladder stones      Most Recent Cardiac Studies: Cardiac catheterization 08-14-2014:   CORONARY CIRCULATION: The coronary circulation is co-dominant. Normal coronaries. She had severe radial artery spasm and thus could not cross the aortic valve. I was able to finish coronary angiography. Left main: Normal. LAD: The vessel was normal sized. Angiography showed no evidence of disease. 1st diagonal: The vessel was normal sized. Angiography showed no evidence of disease. 2nd diagonal: The vessel was very small sized. 3rd diagonal: The vessel was very small sized. Circumflex: The  vessel was normal sized. Angiography showed no evidence of disease. Left AV groove artery: The vessel was normal sized. Angiography showed no evidence of disease. 1st left posterolateral: The vessel was small sized. Angiography showed minor luminal irregularities. 2nd left posterolateral: The vessel was small sized. Angiography showed minor luminal irregularities. RCA: The vessel was normal sized. Angiography showed no evidence of  disease. Right PDA: The vessel was normal sized. Angiography showed no evidence of disease.   VENTRICLES: EF was not assessed and EF by echo was 60 %. RECOMMENDATIONS: -Recommend pacemaker placement for complete heart block.   Echo 07/23/2014:   Summary:  1. Left ventricular ejection fraction, by visual estimation, is 65 to  70%.  2. Normal global left ventricular systolic function.  3. Impaired relaxation pattern of LV diastolic filling.  4. Mildly dilated left atrium.  5. Mild mitral valve regurgitation.  6. Mild aortic regurgitation.  7. Mildly elevated pulmonary artery systolic pressure.  8. Not visualized inferior vena cava. 2D AND M-MODE MEASUREMENTS (normal ranges within parentheses): Left Ventricle:         Normal IVSd (2D):      1.15 cm (0.7-1.1) LVPWd (2D):     1.12 cm (0.7-1.1) Aorta/LA:                 Normal LVIDd (2D):     4.25 cm (3.4-5.7) Aortic Root (2D): 2.80 cm (2.4-3.7) LVIDs (2D):     3.06 cm           Left Atrium (2D): 3.90 cm (1.9-4.0) LV FS (2D):     28.0 %  (>25%) LV EF (2D):     54.5 %  (>50%)                                   Right Ventricle:                                   RVd (2D):        3.78 cm LV DIASTOLIC FUNCTION: MV Peak E: 1.13 m/s Decel Time: 187 msec MV Peak A: 1.50 m/s E/A Ratio: 0.75 SPECTRAL DOPPLER ANALYSIS (where applicable): Mitral Valve: MV P1/2 Time: 54.23 msec MV Area, PHT: 4.06 cm Aortic Valve: AoV Max Vel: 1.75 m/s AoV Peak PG: 12.2 mmHg AoV Mean PG: LVOT Vmax: 1.18 m/s LVOT VTI:  LVOT Diameter: 2.00 cm AoV Area, Vmax: 2.12 cm AoV Area, VTI:  AoV Area, Vmn: Tricuspid Valve and PA/RV Systolic Pressure: TR Max Velocity: 3.13 m/s RA  Pressure: 5 mmHg RVSP/PASP: 44.2 mmHg Pulmonic Valve: PV Max Velocity: 1.35 m/s PV Max PG: 7.3 mmHg PV Mean PG:   Surgical History:  Past Surgical History  Procedure Laterality Date  . Gallbladder surgery       Home Meds: Prior to Admission medications   Medication Sig Start Date End  Date Taking? Authorizing Provider  aspirin 81 MG tablet Take 81 mg by mouth daily.    Historical Provider, MD  atenolol (TENORMIN) 25 MG tablet Take 25 mg by mouth daily.  01/08/14   Historical Provider, MD  nitroGLYCERIN (NITROSTAT) 0.4 MG SL tablet Place 0.4 mg under the tongue every 5 (five) minutes as needed for chest pain.    Historical Provider, MD    Allergies:  Allergies  Allergen Reactions  . Codeine     History   Social  History  . Marital Status: Married    Spouse Name: N/A    Number of Children: N/A  . Years of Education: N/A   Occupational History  . Not on file.   Social History Main Topics  . Smoking status: Never Smoker   . Smokeless tobacco: Not on file  . Alcohol Use: No  . Drug Use: No  . Sexual Activity: Not on file   Other Topics Concern  . Not on file   Social History Narrative  . No narrative on file     Family History  Problem Relation Age of Onset  . Heart attack Father   . Heart disease Brother     Review of Systems: General: positive for fatigue. negative for chills, fever, night sweats or weight changes.  Cardiovascular: positive for chest pains. negative for edema, orthopnea, palpitations, paroxysmal nocturnal dyspnea, shortness of breath or dyspnea on exertion Dermatological: negative for rash Respiratory: negative for cough or wheezing Urologic: negative for hematuria Abdominal: negative for nausea, vomiting, diarrhea, bright red blood per rectum, melena, or hematemesis Neurologic: positive for headache. negative for visual changes, syncope, or dizziness All other systems reviewed and are otherwise negative except as noted above.  Labs: See packet  Radiology/Studies:  See packet   EKG: sinus rhythm with 2nd degree AV block, 38 bpm, right axis deviation   Physical Exam: BP: 232/60, Pulse: 34, Temp: 98.6, Resp: 20, Pulse ox: 99% on 2L nasal cannula:  General: Well developed, well nourished, in no acute distress. Head:  Normocephalic, atraumatic, sclera non-icteric, no xanthomas, nares are without discharge.  Neck: Negative for carotid bruits. JVD not elevated. Lungs: Clear bilaterally to auscultation without wheezes, rales, or rhonchi. Breathing is unlabored. Heart: Bradycardic with S1 S2. 2/6 systolic murmur, rubs, or gallops appreciated. Abdomen: Soft, non-tender, non-distended with normoactive bowel sounds. No hepatomegaly. No rebound/guarding. No obvious abdominal masses. Msk:  Strength and tone appear normal for age. Extremities: No clubbing or cyanosis. No edema.  Distal pedal pulses are 2+ and equal bilaterally. Neuro: Alert and oriented X 3. No focal deficit. No facial asymmetry. Moves all extremities spontaneously. Psych:  Responds to questions appropriately with a normal affect.    ASSESSMENT AND PLAN:  1. Bradycardia: -Currently with 2:1 AV block with HR varying between mid 20s-40s, with intermittent complete heart block with junctional escape rhythm, asymptomatic -Pacer pads are in place -Should she decline would need to either tanscutaneously pace or take to cath lab for venous temp wire placement -Continue to hold all rate limting agents -Cardiac cath as above (normal coronaries) -Dr. Fletcher Anon has discussed the case with Dr. Lovena Le for placement of PPM today given the above  -Transfer to [facility] for the above  2. HTN: -This is compensatory given her significant bradycardia (see pulse pressures) -Would use afterload reducing agents (ACEi or hydralazine) to keep SBP around 160-180 as she needs this for perfusion  Signed, Catrina Fellenz PA-C 07/24/2014, 10:57 AM

## 2014-07-24 NOTE — CV Procedure (Signed)
SURGEON:  Cristopher Peru, MD     PREPROCEDURE DIAGNOSIS:  Symptomatic Bradycardia due to complete heart block    POSTPROCEDURE DIAGNOSIS:  Same as preprocedure diagnosis     PROCEDURES:   1. Pacemaker implantation.     INTRODUCTION: Elaine Ford is a 69 y.o. female  with a history of bradycardia who presents today for pacemaker implantation.  The patient reports intermittent episodes of dizziness over the past few months.  No reversible causes have been identified.  The patient therefore presents today for pacemaker implantation.     DESCRIPTION OF PROCEDURE:  Informed written consent was obtained, and   the patient was brought to the electrophysiology lab in a fasting state.  The patient required no sedation for the procedure today.  The patients left chest was prepped and draped in the usual sterile fashion by the EP lab staff. The skin overlying the left deltopectoral region was infiltrated with lidocaine for local analgesia.  A 4-cm incision was made over the left deltopectoral region.  A left subcutaneous pacemaker pocket was fashioned using a combination of sharp and blunt dissection. Electrocautery was required to assure hemostasis.     RA/RV Lead Placement: The left axillary vein was therefore directly visualized and cannulated.  Through the left axillary vein, a St. Jude [id number] (serial number [id number]) right atrial lead and a St. Jude (serial number [id number]) right ventricular lead were advanced with fluoroscopic visualization into the right atrial appendage and right ventricular apical septal positions respectively.  Initial atrial lead P- waves measured 3 mV with impedance of 523 ohms and a threshold of 1.4 V at 0.5 msec.  Right ventricular lead R-waves measured 7 mV with an impedance of 622 ohms and a threshold of 1 V at 0.5 msec.  Both leads were secured to the pectoralis fascia using #2-0 silk over the suture sleeves.   Device Placement:  The leads were then connected to a St. Jude  (serial number [id number]) pacemaker.  The pocket was irrigated with copious gentamicin solution.  The pacemaker was then placed into the pocket.  The pocket was then closed in 2 layers with 2.0 Vicryl suture for the subcutaneous and subcuticular layers.  Steri-Strips and a sterile dressing were then applied.  There were no early apparent complications.     CONCLUSIONS:   1. Successful implantation of a St. Jude dual-chamber pacemaker for symptomatic bradycardia due to complete heart block  2. No early apparent complications.           Cristopher Peru, MD 07/24/2014 4:39 PM

## 2014-07-25 ENCOUNTER — Inpatient Hospital Stay (HOSPITAL_COMMUNITY): Payer: Commercial Managed Care - HMO

## 2014-07-25 ENCOUNTER — Encounter (HOSPITAL_COMMUNITY): Payer: Self-pay | Admitting: Internal Medicine

## 2014-07-25 ENCOUNTER — Encounter (HOSPITAL_COMMUNITY)
Admission: EM | Disposition: A | Discharge status: Home / Self Care | Payer: Self-pay | Source: Other Acute Inpatient Hospital | Attending: Internal Medicine

## 2014-07-25 DIAGNOSIS — T82110A Breakdown (mechanical) of cardiac electrode, initial encounter: Secondary | ICD-10-CM

## 2014-07-25 DIAGNOSIS — T82120A Displacement of cardiac electrode, initial encounter: Secondary | ICD-10-CM

## 2014-07-25 DIAGNOSIS — I442 Atrioventricular block, complete: Secondary | ICD-10-CM

## 2014-07-25 HISTORY — PX: LEAD REVISION: SHX5945

## 2014-07-25 LAB — CBC WITH DIFFERENTIAL/PLATELET
BASOS ABS: 0 10*3/uL (ref 0.0–0.1)
BASOS PCT: 0 % (ref 0–1)
Eosinophils Absolute: 0.1 10*3/uL (ref 0.0–0.7)
Eosinophils Relative: 2 % (ref 0–5)
HEMATOCRIT: 40 % (ref 36.0–46.0)
Hemoglobin: 13.2 g/dL (ref 12.0–15.0)
LYMPHS PCT: 16 % (ref 12–46)
Lymphs Abs: 1.2 10*3/uL (ref 0.7–4.0)
MCH: 27.7 pg (ref 26.0–34.0)
MCHC: 33 g/dL (ref 30.0–36.0)
MCV: 83.9 fL (ref 78.0–100.0)
Monocytes Absolute: 0.5 10*3/uL (ref 0.1–1.0)
Monocytes Relative: 7 % (ref 3–12)
NEUTROS ABS: 5.6 10*3/uL (ref 1.7–7.7)
Neutrophils Relative %: 75 % (ref 43–77)
Platelets: 249 10*3/uL (ref 150–400)
RBC: 4.77 MIL/uL (ref 3.87–5.11)
RDW: 14.1 % (ref 11.5–15.5)
WBC: 7.3 10*3/uL (ref 4.0–10.5)

## 2014-07-25 LAB — TSH: TSH: 4.577 u[IU]/mL — ABNORMAL HIGH (ref 0.350–4.500)

## 2014-07-25 LAB — COMPREHENSIVE METABOLIC PANEL
ALT: 39 U/L — ABNORMAL HIGH (ref 0–35)
ANION GAP: 6 (ref 5–15)
AST: 32 U/L (ref 0–37)
Albumin: 3 g/dL — ABNORMAL LOW (ref 3.5–5.2)
Alkaline Phosphatase: 71 U/L (ref 39–117)
BILIRUBIN TOTAL: 1.2 mg/dL (ref 0.3–1.2)
BUN: 13 mg/dL (ref 6–23)
CO2: 26 mmol/L (ref 19–32)
CREATININE: 1.01 mg/dL (ref 0.50–1.10)
Calcium: 8.5 mg/dL (ref 8.4–10.5)
Chloride: 106 mEq/L (ref 96–112)
GFR, EST AFRICAN AMERICAN: 65 mL/min — AB (ref >90)
GFR, EST NON AFRICAN AMERICAN: 56 mL/min — AB (ref >90)
Glucose, Bld: 101 mg/dL — ABNORMAL HIGH (ref 70–99)
Potassium: 3.5 mmol/L (ref 3.5–5.1)
Sodium: 138 mmol/L (ref 135–145)
Total Protein: 6.6 g/dL (ref 6.0–8.3)

## 2014-07-25 LAB — MAGNESIUM: MAGNESIUM: 2 mg/dL (ref 1.5–2.5)

## 2014-07-25 SURGERY — LEAD REVISION
Anesthesia: LOCAL

## 2014-07-25 MED ORDER — CEFAZOLIN SODIUM-DEXTROSE 2-3 GM-% IV SOLR
2.0000 g | INTRAVENOUS | Status: DC
  Filled 2014-07-25: qty 50

## 2014-07-25 MED ORDER — CHLORHEXIDINE GLUCONATE 4 % EX LIQD
60.0000 mL | Freq: Once | CUTANEOUS | Status: AC
  Administered 2014-07-25: 4 via TOPICAL

## 2014-07-25 MED ORDER — SODIUM CHLORIDE 0.9 % IV SOLN
INTRAVENOUS | Status: DC
  Administered 2014-07-25: 50 mL/h via INTRAVENOUS

## 2014-07-25 MED ORDER — FENTANYL CITRATE 0.05 MG/ML IJ SOLN
INTRAMUSCULAR | Status: AC
  Filled 2014-07-25: qty 2

## 2014-07-25 MED ORDER — SODIUM CHLORIDE 0.9 % IV SOLN
INTRAVENOUS | Status: DC
Start: 2014-07-25 — End: 2014-07-25
  Administered 2014-07-25: 50 mL/h via INTRAVENOUS

## 2014-07-25 MED ORDER — MIDAZOLAM HCL 5 MG/5ML IJ SOLN
INTRAMUSCULAR | Status: AC
  Filled 2014-07-25: qty 5

## 2014-07-25 MED ORDER — TRAMADOL HCL 50 MG PO TABS
50.0000 mg | ORAL_TABLET | Freq: Four times a day (QID) | ORAL | Status: DC | PRN
Start: 2014-07-25 — End: 2014-07-27
  Administered 2014-07-25 – 2014-07-26 (×2): 50 mg via ORAL
  Filled 2014-07-25 (×2): qty 1

## 2014-07-25 MED ORDER — DIAZEPAM 5 MG PO TABS
5.0000 mg | ORAL_TABLET | ORAL | Status: AC
  Filled 2014-07-25: qty 1

## 2014-07-25 MED ORDER — ALPRAZOLAM 0.25 MG PO TABS
0.2500 mg | ORAL_TABLET | Freq: Two times a day (BID) | ORAL | Status: DC | PRN
  Administered 2014-07-25: 0.25 mg via ORAL
  Filled 2014-07-25: qty 1

## 2014-07-25 MED ORDER — BENZONATATE 100 MG PO CAPS
200.0000 mg | ORAL_CAPSULE | Freq: Three times a day (TID) | ORAL | Status: DC | PRN
  Administered 2014-07-25 – 2014-07-26 (×3): 200 mg via ORAL
  Filled 2014-07-25 (×3): qty 2

## 2014-07-25 MED ORDER — ONDANSETRON HCL 4 MG/2ML IJ SOLN: 4.0000 mg | Freq: Four times a day (QID) | INTRAMUSCULAR | Status: DC | PRN

## 2014-07-25 MED ORDER — SODIUM CHLORIDE 0.9 % IV SOLN
INTRAVENOUS | Status: AC
  Administered 2014-07-25: 19:00:00 via INTRAVENOUS

## 2014-07-25 MED ORDER — DIAZEPAM 5 MG PO TABS
5.0000 mg | ORAL_TABLET | ORAL | Status: AC
  Administered 2014-07-25: 5 mg via ORAL

## 2014-07-25 MED ORDER — ZOLPIDEM TARTRATE 5 MG PO TABS
5.0000 mg | ORAL_TABLET | Freq: Every evening | ORAL | Status: DC | PRN
Start: 2014-07-25 — End: 2014-07-27
  Administered 2014-07-25: 5 mg via ORAL
  Filled 2014-07-25: qty 1

## 2014-07-25 MED ORDER — ACETAMINOPHEN 325 MG PO TABS: 325.0000 mg | ORAL_TABLET | ORAL | Status: DC | PRN

## 2014-07-25 MED ORDER — CEFAZOLIN SODIUM 1-5 GM-% IV SOLN
1.0000 g | Freq: Four times a day (QID) | INTRAVENOUS | Status: AC
  Administered 2014-07-25 – 2014-07-26 (×3): 1 g via INTRAVENOUS
  Filled 2014-07-25 (×3): qty 50

## 2014-07-25 MED ORDER — LIDOCAINE HCL (PF) 1 % IJ SOLN
INTRAMUSCULAR | Status: AC
  Filled 2014-07-25: qty 60

## 2014-07-25 MED ORDER — SODIUM CHLORIDE 0.9 % IR SOLN
80.0000 mg | Status: DC
  Filled 2014-07-25: qty 2

## 2014-07-25 NOTE — CV Procedure (Signed)
Elaine Ford [id number]  [id number]  Preop Dx: microperforation  Lead failure Postop Dx same/   Procedure: implant new RV lead, repositioning of RA lead, explantation of RV lead repair of RA  lead  Cx: None   EBL: Minimal    Dictation number [id number]  Virl Axe, MD 07/25/2014 6:26 PM

## 2014-07-25 NOTE — Progress Notes (Addendum)
       Patient Name: Elaine Ford      SUBJECTIVE: complaining of pleuritic pain both with cough and breathing, developed during the night and became worse folowing CXR  No difficulty with breathing  Past Medical History  Diagnosis Date  . Hypertension   . Hyperlipidemia   . Hemorrhoid   . Gall bladder stones     Scheduled Meds:  Scheduled Meds: . aspirin EC  81 mg Oral Daily  . atorvastatin  40 mg Oral q1800  . carvedilol  12.5 mg Oral BID WC  .  ceFAZolin (ANCEF) IV  1 g Intravenous Q6H  . heparin  5,000 Units Subcutaneous 3 times per day  . sodium chloride  3 mL Intravenous Q12H   Continuous Infusions: . sodium chloride 50 mL/hr at 07/25/14 0647   sodium chloride, acetaminophen, nitroGLYCERIN, ondansetron (ZOFRAN) IV, sodium chloride   Medication List    ASK your doctor about these medications        aspirin 81 MG tablet  Take 81 mg by mouth daily.     atenolol 25 MG tablet  Commonly known as:  TENORMIN  Take 25 mg by mouth daily.     nitroGLYCERIN 0.4 MG SL tablet  Commonly known as:  NITROSTAT  Place 0.4 mg under the tongue every 5 (five) minutes as needed for chest pain.         PHYSICAL EXAM Filed Vitals:   07/25/14 0200 07/25/14 0300 07/25/14 0400 07/25/14 0500  BP: 148/46 158/56 143/43 149/60  Pulse: 65 63 59 60  Temp:   99 F (37.2 C)   TempSrc:   Oral   Resp: 26 25 16 19   SpO2: 93% 93% 94% 91%    Well developed and nourished in no acute distress HENT normal Neck supple with JVP-flat No rub NO hematoma Clear Regular rate and rhythm, no murmurs or gallops Abd-soft with active BS No Clubbing cyanosis edema Skin-warm and dry A & Oriented  Grossly normal sensory and motor function   TELEMETRY: Reviewed telemetry pt in  P-synchronous/ AV  pacing :    Intake/Output Summary (Last 24 hours) at 07/25/14 0737 Last data filed at 07/25/14 0700  Gross per 24 hour  Intake   1000 ml  Output      0 ml  Net   1000 ml     LABS: Basic Metabolic Panel:  Recent Labs Lab 07/24/14 1935  CREATININE 0.81   Cardiac Enzymes: No results for input(s): CKTOTAL, CKMB, CKMBINDEX, TROPONINI in the last 72 hours. CBC:  Recent Labs Lab 07/24/14 1935  WBC 10.5  HGB 13.5  HCT 40.6  MCV 82.7  PLT 247     Device Interrogation normla device function CXR good lead position without PTX  ASSESSMENT AND PLAN:  Active Problems:   Essential hypertension   Complete heart block  Post pacemaker implantation pleuritic pain most consistent with microperforation  Its midline location makes it difficult to know the source so will need to move both leads  She is device dependent so will favor putting in new v lead and repositioning old Atrial lead BP elevated will resume atenolol and increase to 5o mg at discharge  Hemodynamically stable so no evidence of tamponade  Reviewed with family  Signed, Virl Axe MD  07/25/2014

## 2014-07-26 ENCOUNTER — Encounter (HOSPITAL_COMMUNITY): Payer: Self-pay | Admitting: Internal Medicine

## 2014-07-26 DIAGNOSIS — I10 Essential (primary) hypertension: Secondary | ICD-10-CM

## 2014-07-26 DIAGNOSIS — T82110A Breakdown (mechanical) of cardiac electrode, initial encounter: Secondary | ICD-10-CM

## 2014-07-26 DIAGNOSIS — I442 Atrioventricular block, complete: Principal | ICD-10-CM

## 2014-07-26 DIAGNOSIS — Z950 Presence of cardiac pacemaker: Secondary | ICD-10-CM

## 2014-07-26 MED ORDER — LABETALOL HCL 200 MG PO TABS
200.0000 mg | ORAL_TABLET | Freq: Two times a day (BID) | ORAL | Status: DC
  Administered 2014-07-26 – 2014-07-27 (×2): 200 mg via ORAL
  Filled 2014-07-26 (×3): qty 1

## 2014-07-26 MED ORDER — LABETALOL HCL 5 MG/ML IV SOLN
20.0000 mg | Freq: Once | INTRAVENOUS | Status: AC
  Administered 2014-07-26: 20 mg via INTRAVENOUS
  Filled 2014-07-26: qty 4

## 2014-07-26 MED ORDER — HYDROCORTISONE 1 % EX CREA
TOPICAL_CREAM | Freq: Four times a day (QID) | CUTANEOUS | Status: DC | PRN
  Filled 2014-07-26: qty 28

## 2014-07-26 MED ORDER — LISINOPRIL 10 MG PO TABS
10.0000 mg | ORAL_TABLET | Freq: Every day | ORAL | Status: DC
  Administered 2014-07-26 – 2014-07-27 (×2): 10 mg via ORAL
  Filled 2014-07-26 (×2): qty 1

## 2014-07-26 MED ORDER — LABETALOL HCL 5 MG/ML IV SOLN
40.0000 mg | Freq: Once | INTRAVENOUS | Status: AC
  Administered 2014-07-26: 40 mg via INTRAVENOUS
  Filled 2014-07-26: qty 8

## 2014-07-26 MED ORDER — HYDROCHLOROTHIAZIDE 12.5 MG PO CAPS
12.5000 mg | ORAL_CAPSULE | Freq: Every day | ORAL | Status: DC
  Administered 2014-07-26 – 2014-07-27 (×2): 12.5 mg via ORAL
  Filled 2014-07-26 (×2): qty 1

## 2014-07-26 NOTE — Op Note (Signed)
Elaine Ford, Elaine Ford                ACCOUNT NO.:  [id number]  MEDICAL RECORD NO.:  [id number]  LOCATION:  2H13C                        FACILITY:  [facility]  PHYSICIAN:  Deboraha Sprang, MD, FACCDATE OF BIRTH:  [birth year]  DATE OF PROCEDURE:  07/25/2014 DATE OF DISCHARGE:                              OPERATIVE REPORT   PREOPERATIVE DIAGNOSIS:  Complete heart block, previously implanted pacemaker with micro perforation manifested by pleuritic chest pain.  POSTOPERATIVE DIAGNOSIS:  Complete heart block, previously implanted pacemaker with micro perforation manifested by pleuritic chest pain.  PROCEDURE:  Implantation of a new right ventricular lead, explantation of the previous right ventricular lead, repositioning of the right atrial lead, repair of the right atrial lead.  PROCEDURE DESCRIPTION:  Following obtaining consent, the patient was brought to electrophysiology laboratory and placed on the fluoroscopic table in supine position.  After routine prep and drape, lidocaine was infiltrated in the line of the previous incision and carried down to the layer of device pocket using sharp dissection.  The pocket was opened. The leads were left in position.  At this point, we attempted to gain access to the extrathoracic left subclavian vein which was accomplished without difficulty without the aspiration or puncture of the artery. This having been accomplished, we placed a 7-French sheath and through this, passed a St. Jude 1688 TC 52-cm lead, serial # [id number].  Under fluoroscopic guidance, to the right ventricular apex where there was no intrinsic sensed beats but the sensed beats from the previously implanted paced leads were about 10 mV, the current of injury was brisk, the impedance was 932 with threshold 1 V at 0.4 milliseconds, current threshold 1.0 mA.  This lead was secured to the prepectoral fascia.  We then loosened up the anchoring sutures for the atrial and ventricular lead,  temporarily secured the ventricular lead as the lead had been secured in tandem.  We then were able to free up the atrial lead with little difficulty with only about 4 rotations.  We then re-deployed the atrial lead and this took about 7 rotations.  This lead was secured to the prepectoral fascia also.  Because of the way the leads were sewn there was a suture against the lead and without the anchoring suture it was my concern that when I nicked the suture around the lead I may have disrupted the insulation of the atrial lead and so medical adhesive was placed over the span of the atrial lead to cover this area.  At this point, a straight stylet was placed into the right ventricular lead and the right ventricular lead was removed without difficulty.  The patient was hemodynamically stable.  The pocket was copiously irrigated with antibiotic containing saline solution.  Hemostasis was assured. Fluoroscopy 15 minutes after the procedure was completed demonstrated good lateral margin movement.  The pocket was copiously irrigated with antibiotic containing saline solution.  Hemostasis was assured.  The leads and the pulse generator were placed in the pocket and secured to the prepectoral fascia. Surgicel was placed along the posterior to cephalad and lateral ramus. The wound was closed in 3 layers in normal fashion.  The wound was washed, dried, and a  Dermabond dressing was applied.  Needle counts, sponge counts, and instrument counts were correct at the end of the procedure according to the staff.  The patient tolerated the procedure without apparent complication apart from some discomfort.     Deboraha Sprang, MD, [facility]     SCK/MEDQ  D:  07/25/2014  T:  07/26/2014  Job:  [id number]

## 2014-07-26 NOTE — Progress Notes (Addendum)
Patient Name: Elaine Ford      SUBJECTIVE:cough resolved some soreness but superficial as opposed to deep Past Medical History  Diagnosis Date  . Hypertension   . Hyperlipidemia   . Hemorrhoid   . Gall bladder stones   . Pacemaker-St Jude 07/26/2014    Scheduled Meds:  Scheduled Meds: . aspirin EC  81 mg Oral Daily  . atorvastatin  40 mg Oral q1800  . carvedilol  12.5 mg Oral BID WC  .  ceFAZolin (ANCEF) IV  1 g Intravenous Q6H  . heparin  5,000 Units Subcutaneous 3 times per day  . sodium chloride  3 mL Intravenous Q12H   Continuous Infusions:  sodium chloride, acetaminophen, ALPRAZolam, benzonatate, nitroGLYCERIN, ondansetron (ZOFRAN) IV, sodium chloride, traMADol, zolpidem    PHYSICAL EXAM Filed Vitals:   07/26/14 0600 07/26/14 0700 07/26/14 0750 07/26/14 0800  BP: 170/62 223/69 229/80 212/65  Pulse: 60 59 67 62  Temp:   98.7 F (37.1 C)   TempSrc:   Oral   Resp: 14 15 21 22   SpO2: 97% 97% 97% 96%   Well developed and nourished in no acute distress HENT normal Neck supple with JVP-flat Clear pocet without hematoma Regular rate and rhythm, no murmurs or gallops Abd-soft with active BS No Clubbing cyanosis edema Skin-warm and dry A & Oriented  Grossly normal sensory and motor function  TELEMETRY: Reviewed telemetry pt in *P-synchronous/ AV  pacing     Intake/Output Summary (Last 24 hours) at 07/26/14 0856 Last data filed at 07/26/14 0800  Gross per 24 hour  Intake   1380 ml  Output    852 ml  Net    528 ml    LABS: Basic Metabolic Panel:  Recent Labs Lab 07/24/14 1935 07/25/14 1626  NA  --  138  K  --  3.5  CL  --  106  CO2  --  26  GLUCOSE  --  101*  BUN  --  13  CREATININE 0.81 1.01  CALCIUM  --  8.5  MG  --  2.0   Cardiac Enzymes: No results for input(s): CKTOTAL, CKMB, CKMBINDEX, TROPONINI in the last 72 hours. CBC:  Recent Labs Lab 07/24/14 1935 07/25/14 1626  WBC 10.5 7.3  NEUTROABS  --  5.6  HGB 13.5  13.2  HCT 40.6 40.0  MCV 82.7 83.9  PLT 247 249   PROTIME: No results for input(s): LABPROT, INR in the last 72 hours. Liver Function Tests:  Recent Labs  07/25/14 1626  AST 32  ALT 39*  ALKPHOS 71  BILITOT 1.2  PROT 6.6  ALBUMIN 3.0*   No results for input(s): LIPASE, AMYLASE in the last 72 hours. BNP: BNP (last 3 results) No results for input(s): PROBNP in the last 8760 hours. D-Dimer: No results for input(s): DDIMER in the last 72 hours. Hemoglobin A1C: No results for input(s): HGBA1C in the last 72 hours. Fasting Lipid Panel: No results for input(s): CHOL, HDL, LDLCALC, TRIG, CHOLHDL, LDLDIRECT in the last 72 hours. Thyroid Function Tests:  Recent Labs  07/25/14 1625  TSH 4.577*   Anemia Panel: No results for input(s): VITAMINB12, FOLATE, FERRITIN, TIBC, IRON, RETICCTPCT in the last 72 hours.   Device Interrogation normal device function   ASSESSMENT AND PLAN:  Active Problems:   Essential hypertension   Complete heart block   Pacemaker-St Jude   Pacemaker lead microperforation   Poorly controlled hypertenison  Will change carvedilol to labetolol  and add lisinopril 10/12.5  Transfer to floor hiome in am  F/u GT  Signed, Virl Axe MD  07/26/2014

## 2014-07-27 ENCOUNTER — Encounter (HOSPITAL_COMMUNITY): Payer: Self-pay | Admitting: Physician Assistant

## 2014-07-27 ENCOUNTER — Other Ambulatory Visit: Payer: Self-pay | Admitting: Physician Assistant

## 2014-07-27 DIAGNOSIS — I10 Essential (primary) hypertension: Secondary | ICD-10-CM

## 2014-07-27 DIAGNOSIS — Z950 Presence of cardiac pacemaker: Secondary | ICD-10-CM

## 2014-07-27 MED ORDER — LISINOPRIL-HYDROCHLOROTHIAZIDE 20-12.5 MG PO TABS: 1.0000 | ORAL_TABLET | Freq: Every day | ORAL | Status: DC

## 2014-07-27 MED ORDER — TRAMADOL HCL 50 MG PO TABS: 50.0000 mg | ORAL_TABLET | Freq: Two times a day (BID) | ORAL | Status: DC | PRN

## 2014-07-27 MED ORDER — BENZONATATE 200 MG PO CAPS: 200.0000 mg | ORAL_CAPSULE | Freq: Three times a day (TID) | ORAL | Status: DC | PRN

## 2014-07-27 MED ORDER — LABETALOL HCL 200 MG PO TABS: 200.0000 mg | ORAL_TABLET | Freq: Two times a day (BID) | ORAL | Status: DC

## 2014-07-27 MED ORDER — LISINOPRIL 20 MG PO TABS: 20.0000 mg | ORAL_TABLET | Freq: Every day | ORAL | Status: DC

## 2014-07-27 MED ORDER — ACETAMINOPHEN 325 MG PO TABS: 650.0000 mg | ORAL_TABLET | ORAL | Status: DC | PRN

## 2014-07-27 NOTE — Discharge Instructions (Signed)
° ° °  Supplemental Discharge Instructions for  Pacemaker Patients  Activity No heavy lifting or vigorous activity with your left  arm for 6 to 8 weeks.  Do not raise your left  arm above your head for one week.  Gradually raise your affected arm as drawn below.          1/23                          1/24                            1/25                     1/26       NO DRIVING for   2 weeks   ; you may begin driving on  6/55/37        . WOUND CARE - Keep the wound area clean and dry.  Do not get this area wet for one week. No showers for one week; you may shower on    1/23          . - The tape/steri-strips on your wound will fall off; do not pull them off.  No bandage is needed on the site.  DO  NOT apply any creams, oils, or ointments to the wound area. - If you notice any drainage or discharge from the wound, any swelling or bruising at the site, or you develop a fever > 101? F after you are discharged home, call the office at once.  Special Instructions - You are still able to use cellular telephones; use the ear opposite the side where you have your pacemaker/defibrillator.  Avoid carrying your cellular phone near your device. - When traveling through airports, show security personnel your identification card to avoid being screened in the metal detectors.  Ask the security personnel to use the hand wand. - Avoid arc welding equipment, MRI testing (magnetic resonance imaging), TENS units (transcutaneous nerve stimulators).  Call the office for questions about other devices. - Avoid electrical appliances that are in poor condition or are not properly grounded. - Microwave ovens are safe to be near or to operate.  Additional information for defibrillator patients should your device go off: - If your device goes off ONCE and you feel fine afterward, notify the device clinic nurses. - If your device goes off ONCE and you do not feel well afterward, call 911. - If your device goes off  TWICE, call 911. - If your device goes off THREE times in one day, call 911.  DO NOT DRIVE YOURSELF OR A FAMILY MEMBER WITH A DEFIBRILLATOR TO THE HOSPITAL--CALL 911.

## 2014-07-27 NOTE — Discharge Summary (Signed)
Discharge Summary   Patient ID: JANERA PEUGH, MRN: [id number], DOB/AGE: 69-Nov-1947 69 y.o.  Admit date: 07/24/2014 Discharge date: 07/27/2014   Primary Care Physician:  Arnetha Courser   Primary Cardiologist:  Dr. Ida Rogue >> prefers to FU in [facility] Primary Electrophysiologist:  Dr. Cristopher Peru   Reason for Admission:  Symptomatic Bradycardia.   Primary Discharge Diagnoses:  Principal Problem:   Complete heart block Active Problems:   Pacemaker lead microperforation    Pacemaker-St Jude   Essential hypertension     Wt Readings from Last 3 Encounters:  07/27/14 211 lb 11.2 oz (96.026 kg)  01/22/14 193 lb 4 oz (87.658 kg)  05/30/13 189 lb 12 oz (86.07 kg)    Secondary Discharge Diagnoses:   Past Medical History  Diagnosis Date  . Hypertension   . Hyperlipidemia   . Hemorrhoid   . Gall bladder stones   . Pacemaker-St Jude 07/26/2014  . Hx of cardiovascular stress test     Myoview 11/14:  No ischemia  . Hx of cardiac cath     [facility] 07/24/14 at [facility] - no significant CAD  . Hx of echocardiogram     Echo 07/23/14 - at [facility]:  EF 65-70%, impaired relaxation, mild LAE, mild MR, mild AI, mild elevated PASP (44.2 mmHg)  . S/P cardiac pacemaker procedure 07/2014    St. Jude - Dr. Lovena Le  . LBBB (left bundle branch block)       Allergies:    Allergies  Allergen Reactions  . Codeine     unknown      Procedures Performed This Admission:    1.  Dual chamber Pacemaker implantation by Dr. Lovena Le 07/24/14 2.  Explantation of RV lead with implant of new RV lead and repositioning of our a lead secondary to microperforation and lead failure by Dr. Caryl Comes 07/25/14   Hospital Course:  HETVI SHAWHAN is a 69 y.o. female with a history of hypertension, Hyperlipidemia, LBBB. She was admitted to [facility] with symptomatic 2:1 high grade AV heart block. Cardiac catheterization demonstrated normal coronary arteries. Echocardiogram demonstrated normal  LV function. She was transferred to [facility] for pacemaker implantation. She underwent implantation of a dual-chamber pacemaker by Dr. Lovena Le 07/24/14. Procedure was, complicated by microperforation. The patient underwent explantation of the RV lead with implantation of a new RV lead and repositioning of RA lead by Dr. Caryl Comes on 07/25/14. Her symptoms of cough and pleuritic chest pain improved after her second procedure. She had difficulty with uncontrolled blood pressure. Her medications were adjusted to include lisinopril/HCTZ 20/12.5 mg daily, labetalol 200 mg twice a day.  She was seen by Dr. Marlou Porch this morning and felt to be stable for discharge to home. She prefers to follow-up in our [place] office.  She will need close follow-up with her primary care physician for blood pressure. A BMET will be drawn at her wound check.    Discharge Vitals:   Blood pressure 172/60, pulse 64, temperature 98.6 F (37 C), temperature source Oral, resp. rate 18, height 5\' 4"  (1.626 m), weight 211 lb 11.2 oz (96.026 kg), SpO2 93 %.   Labs:   Recent Labs  07/24/14 1935 07/25/14 1626  WBC 10.5 7.3  HGB 13.5 13.2  HCT 40.6 40.0  MCV 82.7 83.9  PLT 247 249     Recent Labs  07/24/14 1935 07/25/14 1626  NA  --  138  K  --  3.5  CL  --  106  CO2  --  26  BUN  --  13  CREATININE 0.81 1.01  CALCIUM  --  8.5  PROT  --  6.6  BILITOT  --  1.2  ALKPHOS  --  71  ALT  --  39*  AST  --  32    No results for input(s): CKTOTAL, CKMB, TROPONINI in the last 72 hours.  No results found for: CHOL, HDL, LDLCALC, TRIG  No results found for: DDIMER  Lab Results  Component Value Date   TSH 4.577* 07/25/2014    No results for input(s): INR in the last 72 hours.   Diagnostic Procedures and Studies:  Dg Chest 2 View  07/25/2014   CLINICAL DATA:  Initial encounter for permanent pacemaker placement  EXAM: CHEST  2 VIEW  COMPARISON:  None.  FINDINGS: Left-sided duly permanent pacemaker  placement is evident with lead tips overlying the expected location of the right atrial appendage and right ventricular apex. No evidence for pneumothorax. Interstitial markings are diffusely coarsened with chronic features. No focal airspace consolidation or substantial pleural effusion. The cardio pericardial silhouette is enlarged. Telemetry leads overlie the chest.  IMPRESSION: No evidence for pneumothorax or substantial pleural effusion after permanent pacemaker placement.   Electronically Signed   By: Misty Stanley M.D.   On: 07/25/2014 08:06   Dg Chest Port 1 View  07/25/2014   CLINICAL DATA:  Postop, lead revision of cardiac device.  EXAM: PORTABLE CHEST - 1 VIEW  COMPARISON:  Frontal and lateral views earlier this day.  FINDINGS: Left-sided pacemaker remains in place, leads projecting over the right atrium and ventricle. There are multiple overlying monitoring devices. Cardiomediastinal contours are unchanged with stable cardiomegaly. Coarse interstitial markings are stable from prior. There is no pleural effusion or pneumothorax.  IMPRESSION: Left-sided pacemaker in place. No acute process. There are multiple overlying monitoring devices in place.   Electronically Signed   By: Jeb Levering M.D.   On: 07/25/2014 21:24    Disposition:   Pt is being discharged home today in good condition.  Follow-up Plans & Appointments      Follow-up Information    Follow up with [facility] On 08/05/2014.   Specialty:  Cardiology   Why:  For wound re-check - we will try to get this rescheduled for [facility]; you will need labs drawn at this visit: BMET   Contact information:   [address], [place] [phone]      Follow up with Virl Axe, MD On 10/28/2014.   Specialty:  Cardiology   Why:  we will try to get this rescheduled for Dr. Lovena Le in [facility] information:   [place]  [id number] [phone]       Follow up with LADA, MELINDA P, MD In 1 week.   Specialty:  Family Medicine   Why:  please arrange follow up for a BP check   Contact information:   [place] [address] [phone]       Discharge Medications    Medication List    STOP taking these medications        atenolol 25 MG tablet  Commonly known as:  TENORMIN     nitroGLYCERIN 0.4 MG SL tablet  Commonly known as:  NITROSTAT      TAKE these medications        acetaminophen 325 MG tablet  Commonly known  as:  TYLENOL  Take 2 tablets (650 mg total) by mouth every 4 (four) hours as needed for headache or mild pain.     aspirin 81 MG tablet  Take 81 mg by mouth daily.     benzonatate 200 MG capsule  Commonly known as:  TESSALON  Take 1 capsule (200 mg total) by mouth 3 (three) times daily as needed for cough.     labetalol 200 MG tablet  Commonly known as:  NORMODYNE  Take 1 tablet (200 mg total) by mouth 2 (two) times daily.     lisinopril-hydrochlorothiazide 20-12.5 MG per tablet  Commonly known as:  PRINZIDE,ZESTORETIC  Take 1 tablet by mouth daily.     traMADol 50 MG tablet  Commonly known as:  ULTRAM  Take 1 tablet (50 mg total) by mouth every 12 (twelve) hours as needed for moderate pain or severe pain.         Outstanding Labs/Studies  1. BMET at wound check on 08/05/14  Duration of Discharge Encounter: Greater than 30 minutes including physician and PA time.  Signed, Richardson Dopp, PA-C   07/27/2014 11:18 AM

## 2014-07-27 NOTE — Progress Notes (Signed)
Primary cardiologist:  Dr. Ida Rogue   Subjective:    69 year old female admitted to [facility] with symptomatic 2:1 high-grade AV block. Cardiac catheterization demonstrated normal coronary arteries.  Echocardiogram demonstrated normal LV function. She was transferred to [facility] for pacemaker implantation.   She underwent implantation of a dual-chamber pacemaker by Dr. Lovena Le 07/24/14.   patient developed pleuritic chest pain post procedure. Chest x-ray demonstrated no pneumothorax. She was seen by Dr. Caryl Comes and noted to have microperforation lead failure. She therefore underwent new RV lead implantation and repositioning of RA lead and explantation of the old RV lead.  Seen by Dr. Caryl Comes yesterday. Cough and pleuritic chest discomfort were improved. Anticipated discharge 07/27/14.   This morning, she denies any further pleuritic chest pain. She continues to note a cough. However, this is improved. She is ready for discharge.  Objective:   Temp:  [98.2 F (36.8 C)-99.2 F (37.3 C)] 98.6 F (37 C) (01/17 0518) Pulse Rate:  [60-72] 65 (01/17 0518) Resp:  [13-28] 18 (01/17 0518) BP: (135-229)/(48-75) 135/52 mmHg (01/17 0518) SpO2:  [88 %-96 %] 93 % (01/17 0518) Weight:  [211 lb 11.2 oz (96.026 kg)] 211 lb 11.2 oz (96.026 kg) (01/17 0600) Last BM Date: 07/26/14  Filed Weights   07/27/14 0600  Weight: 211 lb 11.2 oz (96.026 kg)    Intake/Output Summary (Last 24 hours) at 07/27/14 0853 Last data filed at 07/26/14 2157  Gross per 24 hour  Intake    653 ml  Output      0 ml  Net    653 ml    Telemetry:  AV paced.  Exam:  General:  NAD  HEENT:  unremarkable  Lungs:  CTA  Cardiac:  RRR, no JVD, no edema  Chest:  Pacer site dressing dry and intact  Abdomen:  Soft, NT  MSK:  No deformity  Neuro:  No deficit  Psych:  Normal affect  Lab Results:  Basic Metabolic Panel:  Recent Labs Lab 07/24/14 1935 07/25/14 1626  NA  --  138  K  --  3.5  CL  --   106  CO2  --  26  GLUCOSE  --  101*  BUN  --  13  CREATININE 0.81 1.01  CALCIUM  --  8.5  MG  --  2.0    Liver Function Tests:  Recent Labs Lab 07/25/14 1626  AST 32  ALT 39*  ALKPHOS 71  BILITOT 1.2  PROT 6.6  ALBUMIN 3.0*    CBC:  Recent Labs Lab 07/24/14 1935 07/25/14 1626  WBC 10.5 7.3  HGB 13.5 13.2  HCT 40.6 40.0  MCV 82.7 83.9  PLT 247 249    Cardiac Enzymes: No results for input(s): CKTOTAL, CKMB, CKMBINDEX, TROPONINI in the last 168 hours.  BNP: No results for input(s): PROBNP in the last 8760 hours.  Coagulation: No results for input(s): INR in the last 168 hours.   Medications:   Scheduled Medications: . aspirin EC  81 mg Oral Daily  . atorvastatin  40 mg Oral q1800  . heparin  5,000 Units Subcutaneous 3 times per day  . hydrochlorothiazide  12.5 mg Oral Daily  . labetalol  200 mg Oral BID  . lisinopril  10 mg Oral Daily  . sodium chloride  3 mL Intravenous Q12H     Infusions:     PRN Medications:  sodium chloride, acetaminophen, ALPRAZolam, benzonatate, hydrocortisone cream, nitroGLYCERIN, ondansetron (ZOFRAN) IV, sodium chloride, traMADol, zolpidem  Assessment:   Principal Problem:   Complete heart block Active Problems:   Pacemaker lead microperforation    Pacemaker-St Jude   Essential hypertension      Plan/Discussion:    Pleuritic chest pain is improved. She continues to have a cough. However, this is overall improved. Blood pressure uncontrolled. However, it is much better this morning. Adjust lisinopril to 20/12.5 mg daily. Patient prefers to follow-up in [place]. Appointments have already been made in [place]. I will ask the office to change her appointments to [facility]. I have asked her to follow-up with her primary care physician the next 1-2 weeks to recheck her blood pressure. She will need a BMET when she comes in for her wound check. Anticipate discharge once seen by attending cardiologist.  Richardson Dopp, PA-C   07/27/2014 8:53 AM Pager [phone]     Personally seen and examined. Agree with above. Pain improved BP meds adjusted as above She is ready to go No Rub on exam.   Candee Furbish, MD

## 2014-07-29 ENCOUNTER — Encounter: Payer: Self-pay | Admitting: Cardiovascular Disease

## 2014-07-30 ENCOUNTER — Telehealth: Payer: Self-pay | Admitting: *Deleted

## 2014-07-30 DIAGNOSIS — I10 Essential (primary) hypertension: Secondary | ICD-10-CM

## 2014-07-30 NOTE — Telephone Encounter (Signed)
----- Message from Rise Mu, PA-C sent at 07/28/2014  5:02 PM EST ----- Regarding: RE: post hosp follow up Below is the in basket message I got from [employer] over the weekend. We just need to know where she is going to follow up at. Her only scheduled follow up so far is with Dr. Caryl Comes in April. If she plans to follow up with [place] that's great and we can schedule her. If she wants to follow up in [place] then she needs to be scheduled with one of the APPs there within 1-2 weeks.   Thanks,   Ryan   Patient has follow-up with the EP clinic for wound check 1/26 in [place] and Dr. Caryl Comes 4/19 in [place].  She prefers to follow-up in [place].  Please try to reschedule her appointments in [place] if possible.  She would need to see Dr. Lovena Le in [place].  Please call patient to notify her when her appointments will be.  She needs a BMET drawn at the time of her wound check.  If possible, schedule a FU with the PA or NP on the day of her wound check to FU on her BP.  ----- Message -----    From: Tracie Harrier, RN    Sent: 07/28/2014   2:53 PM      To: Rise Mu, PA-C Subject: RE: post hosp follow up                        Guerry Bruin sent me this and Im happy yo help but im not 100% what you needed. Please advise.  ----- Message -----    From: Stana Bunting, RN    Sent: 07/28/2014  12:16 PM      To: Tracie Harrier, RN Subject: FW: post hosp follow up                        Not sure if Ryan sent this to you. Caryl Comes.Marland KitchenMarland KitchenGreensboro...what???? ; )  ----- Message -----    From: Rise Mu, PA-C    Sent: 07/28/2014  12:09 PM      To: Stana Bunting, RN Subject: FW: post hosp follow up                        It appears she is going to follow up in [place], though only appointment scheduled so far is for Dr. Caryl Comes in April. Ca we find out what her plans are and schedule her accordingly?  Thanks,   Ryan ----- Message -----    From: Liliane Shi, PA-C    Sent: 07/27/2014    9:58 AM      To: Liliane Shi, PA-C, Rise Mu, PA-C, # Subject: post hosp follow up                            Patient has follow-up with the EP clinic for wound check 1/26 in [place] and Dr. Caryl Comes 4/19 in [place].  She prefers to follow-up in [place].  Please try to reschedule her appointments in [place] if possible. She would need to see Dr. Lovena Le in [place].  Please call patient to notify her when her appointments will be.  She needs a BMET drawn at the time of her wound check.  If possible, schedule a FU with the PA or NP on  the day of her wound check to FU on her BP. Richardson Dopp, PA-C   07/27/2014 10:00 AM

## 2014-07-30 NOTE — Telephone Encounter (Signed)
Patients husband stated they are not monitoring blood pressure or heart rate at home He wanted to know if they need to go purchase a home bp machine or if the nurse visit on 08/04/14 will be sufficient

## 2014-07-30 NOTE — Telephone Encounter (Signed)
[place] for patient to have nurse visit on 1/26 with follow up the following week if BP and pulse are ok at home. If she is still noting SBP in the 230s that was present prior to transfer to [facility] she needs follow up sooner. She should not be bradycardic at this time as she now has dual chamber PPM.

## 2014-07-30 NOTE — Telephone Encounter (Signed)
Scheduled patient for lab after her would check on 1/26

## 2014-07-31 NOTE — Telephone Encounter (Signed)
Patient's SBP was in the 230s while at [facility]. Discharge SBP was 172. Yes, please have her buy a BP cuff to monitor at home.

## 2014-07-31 NOTE — Telephone Encounter (Signed)
Instructed patient and patients husband to purchase blood pressure cuff and start monitoring blood pressure today  They verbalized understanding  He stated they also have appt with PCP tomorrow to check blood pressure  I instructed patient and husband to call with blood pressure tomorrow  They verbalized understanding   I called [facility] and scheduled patient for lab and BP check at the time of her pacer check  Patient is aware

## 2014-08-04 ENCOUNTER — Ambulatory Visit: Payer: Commercial Managed Care - HMO

## 2014-08-05 ENCOUNTER — Ambulatory Visit: Payer: Commercial Managed Care - HMO

## 2014-08-05 ENCOUNTER — Other Ambulatory Visit: Payer: Commercial Managed Care - HMO

## 2014-08-06 ENCOUNTER — Ambulatory Visit (INDEPENDENT_AMBULATORY_CARE_PROVIDER_SITE_OTHER): Payer: Commercial Managed Care - HMO | Admitting: *Deleted

## 2014-08-06 ENCOUNTER — Ambulatory Visit: Payer: Commercial Managed Care - HMO | Admitting: Physician Assistant

## 2014-08-06 ENCOUNTER — Other Ambulatory Visit (INDEPENDENT_AMBULATORY_CARE_PROVIDER_SITE_OTHER): Payer: Commercial Managed Care - HMO | Admitting: *Deleted

## 2014-08-06 DIAGNOSIS — Z950 Presence of cardiac pacemaker: Secondary | ICD-10-CM

## 2014-08-06 DIAGNOSIS — I442 Atrioventricular block, complete: Secondary | ICD-10-CM | POA: Diagnosis not present

## 2014-08-06 DIAGNOSIS — I10 Essential (primary) hypertension: Secondary | ICD-10-CM | POA: Diagnosis not present

## 2014-08-06 LAB — BASIC METABOLIC PANEL
BUN: 17 mg/dL (ref 6–23)
CO2: 29 meq/L (ref 19–32)
Calcium: 9.7 mg/dL (ref 8.4–10.5)
Chloride: 100 mEq/L (ref 96–112)
Creatinine, Ser: 0.87 mg/dL (ref 0.40–1.20)
GFR: 68.66 mL/min (ref >60.00)
Glucose, Bld: 106 mg/dL — ABNORMAL HIGH (ref 70–99)
Potassium: 4.7 mEq/L (ref 3.5–5.1)
Sodium: 137 mEq/L (ref 135–145)

## 2014-08-06 LAB — MDC_IDC_ENUM_SESS_TYPE_INCLINIC
Battery Voltage: 3.01 V
Brady Statistic RA Percent Paced: 63 %
Brady Statistic RV Percent Paced: 99.98 %
Date Time Interrogation Session: 20160127163900
Implantable Pulse Generator Model: 2240
Implantable Pulse Generator Serial Number: 7706772
Lead Channel Impedance Value: 575 Ohm
Lead Channel Pacing Threshold Amplitude: 0.75 V
Lead Channel Pacing Threshold Amplitude: 1.5 V
Lead Channel Pacing Threshold Pulse Width: 0.4 ms
Lead Channel Setting Pacing Amplitude: 1.75 V
Lead Channel Setting Pacing Amplitude: 3.5 V
Lead Channel Setting Pacing Pulse Width: 0.4 ms
Lead Channel Setting Sensing Sensitivity: 4 mV
MDC IDC MSMT BATTERY REMAINING LONGEVITY: 99.6 mo
MDC IDC MSMT LEADCHNL RA IMPEDANCE VALUE: 562.5 Ohm
MDC IDC MSMT LEADCHNL RA PACING THRESHOLD AMPLITUDE: 0.75 V
MDC IDC MSMT LEADCHNL RA PACING THRESHOLD PULSEWIDTH: 0.4 ms
MDC IDC MSMT LEADCHNL RA PACING THRESHOLD PULSEWIDTH: 0.4 ms
MDC IDC MSMT LEADCHNL RA SENSING INTR AMPL: 5 mV

## 2014-08-06 NOTE — Progress Notes (Signed)
Wound check appointment. Removed tegaderm. No steri-strips, dermabond present. Wound without redness or edema (redness only at point of contact w/ adhesive). Incision edges approximated, wound well healed. Normal device function. Thresholds, sensing, and impedances consistent with implant measurements. Device programmed at 3.5V/auto capture programmed on for extra safety margin until 3 month visit---turned Acap confirm from off to monitor. Histogram distribution appropriate for patient and level of activity. No mode switches or high ventricular rates noted. Patient educated about wound care, arm mobility, lifting restrictions. ROV w/ Dr. Lovena Le 10/28/14.

## 2014-08-06 NOTE — Telephone Encounter (Signed)
LVM to follow up with patient in her blood pressure 08/06/14

## 2014-08-06 NOTE — Addendum Note (Signed)
Addended by: Eulis Foster on: 08/06/2014 02:57 PM   Modules accepted: Orders

## 2014-08-07 ENCOUNTER — Telehealth: Payer: Self-pay | Admitting: *Deleted

## 2014-08-07 NOTE — Telephone Encounter (Signed)
Noted  

## 2014-08-07 NOTE — Telephone Encounter (Signed)
both pt and husband have been notified of lab results with verbal understanding. Confirmed 4/19 appt w/Dr. Lovena Le.

## 2014-08-07 NOTE — Telephone Encounter (Signed)
Patients husband stated that patient does not wish to talk with me at this time  He stated she declines follow up at our practice  He stated that patient will keep following up with Dr. Lovena Le

## 2014-08-20 ENCOUNTER — Encounter: Payer: Self-pay | Admitting: Internal Medicine

## 2014-08-26 DIAGNOSIS — R079 Chest pain, unspecified: Secondary | ICD-10-CM | POA: Diagnosis not present

## 2014-08-29 ENCOUNTER — Encounter (HOSPITAL_COMMUNITY): Payer: Self-pay

## 2014-08-29 ENCOUNTER — Emergency Department (HOSPITAL_COMMUNITY)
Admission: EM | Admit: 2014-08-29 | Discharge: 2014-08-29 | Disposition: A | Discharge status: Home / Self Care | Payer: Commercial Managed Care - HMO | Source: Home / Self Care | Attending: Family Medicine | Admitting: Family Medicine

## 2014-08-29 ENCOUNTER — Emergency Department (HOSPITAL_COMMUNITY)
Admission: EM | Admit: 2014-08-29 | Discharge: 2014-08-29 | Discharge status: Absent without leave | Payer: Commercial Managed Care - HMO | Source: Home / Self Care

## 2014-08-29 DIAGNOSIS — I10 Essential (primary) hypertension: Secondary | ICD-10-CM

## 2014-08-29 MED ORDER — LABETALOL HCL 200 MG PO TABS: 200.0000 mg | ORAL_TABLET | Freq: Two times a day (BID) | ORAL | Status: DC

## 2014-08-29 NOTE — Discharge Instructions (Signed)
Hipertensin (Hypertension) La hipertensin, conocida comnmente como presin arterial alta, se produce cuando la sangre bombea en las arterias con mucha fuerza. Las arterias son los vasos sanguneos que transportan la sangre desde el corazn hacia todas las partes del cuerpo. Una lectura de la presin arterial consiste en un nmero ms alto sobre un nmero ms bajo, por ejemplo, 110/72. El nmero ms alto (presin sistlica) corresponde a la presin interna de las arterias cuando el corazn [place]. El nmero ms bajo (presin diastlica) corresponde a la presin interna de las arterias cuando el corazn se relaja. En condiciones ideales, la presin arterial debe ser inferior a 120/80. La hipertensin fuerza al corazn a trabajar ms para Herbalist. Las arterias pueden estrecharse o ponerse rgidas. La hipertensin conlleva el riesgo de enfermedad cardaca, ictus y otros problemas.  Solis de riesgo de hipertensin son controlables, pero otros no lo son.  [employer] factores de riesgo que usted no puede Chief Technology Officer, se incluyen:   Manufacturing systems engineer. El riesgo es mayor para las Retail banker.  La edad. Los riesgos aumentan con la edad.  El sexo. Antes de los 45aos, los hombres corren ms [employer]. Despus de los 65aos, las mujeres corren ms [employer]. Entre los factores de riesgo que usted puede Chief Technology Officer, se incluyen:  No hacer la cantidad suficiente de actividad fsica o ejercicio.  Tener sobrepeso.  Consumir mucha grasa, azcar, caloras o sal en la dieta.  Beber alcohol en exceso. SIGNOS Y SNTOMAS Por lo general, la hipertensin no causa signos o sntomas. La hipertensin demasiado alta (crisis hipertensiva) puede causar dolor de cabeza, ansiedad, falta de aire y hemorragia nasal. DIAGNSTICO  Para detectar si usted tiene hipertensin, el mdico le medir la presin arterial mientras est sentado, con el brazo  levantado a la altura del corazn. Debe medirla al [facility] veces en el mismo brazo. Determinadas condiciones pueden causar una diferencia de presin arterial entre el brazo izquierdo y Insurance underwriter. El hecho de tener una sola lectura de la presin arterial ms alta que lo normal no significa que Stage manager. En el caso de tener una lectura de la presin arterial con un valor alto, pdale al mdico que la verifique nuevamente. [employer] hipertensin arterial incluye hacer cambios en el estilo de vida y, posiblemente, tomar medicamentos. Un estilo de vida saludable puede ayudar a bajar la presin arterial alta. Quiz deba cambiar algunos hbitos. Los [employer] en el estilo de vida pueden incluir:  Seguir la dieta DASH. Esta dieta tiene un alto contenido de frutas, verduras y Psychologist, prison and probation services. Incluye poca cantidad de sal, carnes rojas y azcares agregados.  Hacer al menos 2horas de actividad fsica enrgica todas las semanas.  Perder peso, si es necesario.  No fumar.  Limitar el consumo de bebidas alcohlicas.  Aprender formas de reducir el estrs. Si los cambios en el estilo de vida no son suficientes para Child psychotherapist la presin arterial, el mdico puede recetarle medicamentos. Quiz necesite tomar ms de uno. Trabaje en conjunto con su mdico para comprender los riesgos y los beneficios. INSTRUCCIONES PARA EL CUIDADO EN EL HOGAR  Haga que le midan de nuevo la presin arterial segn las indicaciones del [place] los medicamentos solamente como se lo haya indicado el mdico. Siga cuidadosamente las indicaciones. Los medicamentos para la presin arterial deben tomarse segn las indicaciones. Los medicamentos pierden eficacia al omitir las dosis. El hecho de omitir  las dosis también aumenta el riesgo de otros problemas. °· No fume. °· Contrólese la presión arterial en su casa según las indicaciones del médico. °SOLICITE ATENCIÓN MÉDICA SI:  °· Piensa  que tiene una reacción alérgica a los medicamentos. °· Tiene mareos o dolores de cabeza con recurrencia. °· Tiene hinchazón en los tobillos. °· Tiene problemas de visión. °SOLICITE ATENCIÓN MÉDICA DE INMEDIATO SI: °· Siente un dolor de cabeza intenso o confusión. °· Siente debilidad inusual, adormecimiento o que se desmayará. °· Siente dolor intenso en el pecho o en el abdomen. °· Vomita repetidas veces. °· Tiene dificultad para respirar. °ASEGÚRESE DE QUE:  °· Comprende estas instrucciones. °· Controlará su afección. °· Recibirá ayuda de inmediato si no mejora o si empeora. °Document Released: 06/27/2005 Document Revised: 11/11/2013 °ExitCare® Patient Information ©2015 [employer]. This information is not intended to replace advice given to you by your health care provider. Make sure you discuss any questions you have with your health care provider. ° °

## 2014-08-29 NOTE — ED Provider Notes (Signed)
CSN: [id number]     Arrival date & time 08/29/14  1522 History   First MD Initiated Contact with Patient 08/29/14 1601     Chief Complaint  Patient presents with  . Medication Refill   (Consider location/radiation/quality/duration/timing/severity/associated sxs/prior Treatment) HPI Comments: Patient was recently scheduled to see her cardiologist, however, on the day of her appointment the office was closed for inclement weather and her appointment has now been rescheduled for April 2015 and she has run out of her blood pressure medication. Comes to [facility] requesting Rx for BP med until she is able to see her cardiologist on 10/28/2014. Needs Rx for Labetalol 200 mg po BID.   The history is provided by the patient and the spouse.    Past Medical History  Diagnosis Date  . Hypertension   . Hyperlipidemia   . Hemorrhoid   . Gall bladder stones   . Pacemaker-St Jude 07/26/2014  . Hx of cardiovascular stress test     Myoview 11/14:  No ischemia  . Hx of cardiac cath     [facility] 07/24/14 at [facility] - no significant CAD  . Hx of echocardiogram     Echo 07/23/14 - at [facility]:  EF 65-70%, impaired relaxation, mild LAE, mild MR, mild AI, mild elevated PASP (44.2 mmHg)  . S/P cardiac pacemaker procedure 07/2014    St. Jude - Dr. Lovena Le  . LBBB (left bundle branch block)    Past Surgical History  Procedure Laterality Date  . Gallbladder surgery    . Permanent pacemaker insertion N/A 07/24/2014    Procedure: PERMANENT PACEMAKER INSERTION;  Surgeon: [name], MD;  Location: [facility] CATH LAB;  Service: Cardiovascular;  Laterality: N/A;  . Lead revision N/A 07/25/2014    Procedure: LEAD REVISION;  Surgeon: Deboraha Sprang, MD;  Location: [facility] CATH LAB;  Service: Cardiovascular;  Laterality: N/A;   Family History  Problem Relation Age of Onset  . Heart attack Father   . Heart disease Brother   . Stroke Father    History  Substance Use Topics  . Smoking status: Never Smoker   . Smokeless tobacco: Not on file   . Alcohol Use: No   OB History    No data available     Review of Systems  All other systems reviewed and are negative.   Allergies  Codeine  Home Medications   Prior to Admission medications   Medication Sig Start Date End Date Taking? Authorizing Provider  acetaminophen (TYLENOL) 325 MG tablet Take 2 tablets (650 mg total) by mouth every 4 (four) hours as needed for headache or mild pain. 07/27/14   Liliane Shi, PA-C  aspirin 81 MG tablet Take 81 mg by mouth daily.    Historical Provider, MD  benzonatate (TESSALON) 200 MG capsule Take 1 capsule (200 mg total) by mouth 3 (three) times daily as needed for cough. 07/27/14   Liliane Shi, PA-C  labetalol (NORMODYNE) 200 MG tablet Take 1 tablet (200 mg total) by mouth 2 (two) times daily. 08/29/14   Audelia Hives Jordell Outten, PA  lisinopril-hydrochlorothiazide (PRINZIDE,ZESTORETIC) 20-12.5 MG per tablet Take 1 tablet by mouth daily. 07/27/14   Liliane Shi, PA-C  traMADol (ULTRAM) 50 MG tablet Take 1 tablet (50 mg total) by mouth every 12 (twelve) hours as needed for moderate pain or severe pain. 07/27/14   Liliane Shi, PA-C   BP 187/103 mmHg  Pulse 70  Temp(Src) 99.3 F (37.4 C) (Oral)  Resp 18  SpO2 96% Physical Exam  Constitutional: She is oriented to person, place, and time. She appears well-developed and well-nourished. No distress.  HENT:  Head: Normocephalic and atraumatic.  Cardiovascular: Normal rate, regular rhythm and normal heart sounds.   Pulmonary/Chest: Effort normal and breath sounds normal.  Musculoskeletal: Normal range of motion.  Neurological: She is alert and oriented to person, place, and time.  Skin: Skin is warm and dry.  Psychiatric: She has a normal mood and affect. Her behavior is normal.  Nursing note and vitals reviewed.   ED Course  Procedures (including critical care time) Labs Review Labs Reviewed - No data to display  Imaging Review No results found.   MDM   1. Essential  hypertension   Rx for two month supply of labetalol provided to patient.    Lutricia Feil, [place] 08/29/14 [phone]

## 2014-08-29 NOTE — ED Notes (Signed)
Came to [facility] for new rx, as she was not able to get into her regular MD office today. She has taken the last dose of her BP pill earlier today. States she has a better BP reading at home, and it always goes up when she is at MD office. Husband concerned about her cough, and thinks it may be due to her lisinopril

## 2014-10-28 ENCOUNTER — Encounter: Payer: Commercial Managed Care - HMO | Admitting: Internal Medicine

## 2014-10-28 ENCOUNTER — Encounter: Payer: Self-pay | Admitting: Internal Medicine

## 2014-10-28 ENCOUNTER — Ambulatory Visit (INDEPENDENT_AMBULATORY_CARE_PROVIDER_SITE_OTHER): Payer: Commercial Managed Care - HMO | Admitting: Internal Medicine

## 2014-10-28 VITALS — BP 132/80 | HR 68 | Ht 64.0 in | Wt 204.0 lb

## 2014-10-28 DIAGNOSIS — I442 Atrioventricular block, complete: Secondary | ICD-10-CM

## 2014-10-28 DIAGNOSIS — Z950 Presence of cardiac pacemaker: Secondary | ICD-10-CM

## 2014-10-28 DIAGNOSIS — I447 Left bundle-branch block, unspecified: Secondary | ICD-10-CM | POA: Diagnosis not present

## 2014-10-28 DIAGNOSIS — E669 Obesity, unspecified: Secondary | ICD-10-CM | POA: Diagnosis not present

## 2014-10-28 DIAGNOSIS — I10 Essential (primary) hypertension: Secondary | ICD-10-CM

## 2014-10-28 LAB — MDC_IDC_ENUM_SESS_TYPE_INCLINIC
Date Time Interrogation Session: 20160419170752
Lead Channel Impedance Value: 750 Ohm
Lead Channel Pacing Threshold Amplitude: 0.5 V
Lead Channel Pacing Threshold Amplitude: 0.75 V
Lead Channel Pacing Threshold Pulse Width: 0.4 ms
Lead Channel Pacing Threshold Pulse Width: 0.4 ms
Lead Channel Sensing Intrinsic Amplitude: 5 mV
Lead Channel Setting Pacing Pulse Width: 0.4 ms
Lead Channel Setting Sensing Sensitivity: 4 mV
MDC IDC MSMT BATTERY REMAINING LONGEVITY: 132 mo
MDC IDC MSMT BATTERY VOLTAGE: 3.01 V
MDC IDC MSMT LEADCHNL RA IMPEDANCE VALUE: 587.5 Ohm
MDC IDC PG SERIAL: 7706772
MDC IDC SET LEADCHNL RA PACING AMPLITUDE: 1.5 V
MDC IDC SET LEADCHNL RV PACING AMPLITUDE: 1 V
MDC IDC STAT BRADY RA PERCENT PACED: 49 %
MDC IDC STAT BRADY RV PERCENT PACED: 99.94 %

## 2014-10-28 NOTE — Assessment & Plan Note (Signed)
Through the help of an interpreter, we discussed the causes of weight gain, and what she might do to lose weight. I've encouraged her to eat less and exercise more.

## 2014-10-28 NOTE — Progress Notes (Signed)
HPI Elaine Ford returns today for followup. She is a very pleasant 69 year old woman with a history of hypertension and complete heart block, status post permanent pacemaker insertion. Postoperatively, she has been bothered by pain around her pacemaker insertion site. She also notes very mild swelling of her upper left arm. She denies chest pain or shortness of breath otherwise. No syncope. Allergies  Allergen Reactions  . Codeine     unknown  . Lisinopril-Hydrochlorothiazide Cough     Current Outpatient Prescriptions  Medication Sig Dispense Refill  . acetaminophen (TYLENOL) 325 MG tablet Take 2 tablets (650 mg total) by mouth every 4 (four) hours as needed for headache or mild pain.    Marland Kitchen aspirin 81 MG tablet Take 81 mg by mouth daily.    Marland Kitchen labetalol (NORMODYNE) 200 MG tablet Take 1 tablet (200 mg total) by mouth 2 (two) times daily. 60 tablet 1  . benzonatate (TESSALON) 200 MG capsule Take 1 capsule (200 mg total) by mouth 3 (three) times daily as needed for cough. (Patient not taking: Reported on 10/28/2014) 20 capsule 1  . traMADol (ULTRAM) 50 MG tablet Take 1 tablet (50 mg total) by mouth every 12 (twelve) hours as needed for moderate pain or severe pain. (Patient not taking: Reported on 10/28/2014) 30 tablet 0   No current facility-administered medications for this visit.     Past Medical History  Diagnosis Date  . Hypertension   . Hyperlipidemia   . Hemorrhoid   . Gall bladder stones   . Pacemaker-St Jude 07/26/2014  . Hx of cardiovascular stress test     Myoview 11/14:  No ischemia  . Hx of cardiac cath     [facility] 07/24/14 at [facility] - no significant CAD  . Hx of echocardiogram     Echo 07/23/14 - at [facility]:  EF 65-70%, impaired relaxation, mild LAE, mild MR, mild AI, mild elevated PASP (44.2 mmHg)  . S/P cardiac pacemaker procedure 07/2014    St. Jude - Dr. Lovena Le  . LBBB (left bundle branch block)     ROS:   All systems reviewed and negative except as noted in the  HPI.   Past Surgical History  Procedure Laterality Date  . Gallbladder surgery    . Permanent pacemaker insertion N/A 07/24/2014    Procedure: PERMANENT PACEMAKER INSERTION;  Surgeon: [name], MD;  Location: [facility] CATH LAB;  Service: Cardiovascular;  Laterality: N/A;  . Lead revision N/A 07/25/2014    Procedure: LEAD REVISION;  Surgeon: Deboraha Sprang, MD;  Location: [facility] CATH LAB;  Service: Cardiovascular;  Laterality: N/A;     Family History  Problem Relation Age of Onset  . Heart attack Father   . Heart disease Brother   . Stroke Father      History   Social History  . Marital Status: Married    Spouse Name: N/A  . Number of Children: N/A  . Years of Education: N/A   Occupational History  . Not on file.   Social History Main Topics  . Smoking status: Never Smoker   . Smokeless tobacco: Not on file  . Alcohol Use: No  . Drug Use: No  . Sexual Activity: Yes    Birth Control/ Protection: Post-menopausal   Other Topics Concern  . Not on file   Social History Narrative     BP 132/80 mmHg  Pulse 68  Ht 5\' 4"  (1.626 m)  Wt 204 lb (92.534 kg)  BMI 35.00  kg/m2  Physical Exam:  Well appearing 69 year old woman, NAD HEENT: Unremarkable Neck:  No JVD, no thyromegally Back:  No CVA tenderness Lungs:  Clear, with no wheezes, rales, or rhonchi. Pacemaker incision is well healed HEART:  Regular rate rhythm, no murmurs, no rubs, no clicks Abd:  soft, positive bowel sounds, no organomegally, no rebound, no guarding Ext:  2 plus pulses, no edema, no cyanosis, no clubbing Skin:  No rashes no nodules Neuro:  CN II through XII intact, motor grossly intact   DEVICE  Normal device function.  See PaceArt for details.   Assess/Plan:

## 2014-10-28 NOTE — Assessment & Plan Note (Signed)
Her St. Jude dual-chamber pacemaker is working normally. We'll plan to recheck in several months. 

## 2014-10-28 NOTE — Patient Instructions (Addendum)
Your physician recommends that you continue on your current medications as directed. Please refer to the Current Medication list given to you today.  Remote monitoring is used to monitor your Pacemaker of ICD from home. This monitoring reduces the number of office visits required to check your device to one time per year. It allows [place] to keep an eye on the functioning of your device to ensure it is working properly. You are scheduled for a device check from home on 01/27/2015. You may send your transmission at any time that day. If you have a wireless device, the transmission will be sent automatically. After your physician reviews your transmission, you will receive a postcard with your next transmission date.  Your physician wants you to follow-up in: January 2016 WITH DR Lovena Le.  You will receive a reminder letter in the mail two months in advance. If you don't receive a letter, please call our office to schedule the follow-up appointment.

## 2014-10-28 NOTE — Assessment & Plan Note (Signed)
Her blood pressure is well controlled. She is encouraged to lose weight and maintain a low-sodium diet. No change in medications today.

## 2014-11-09 NOTE — Discharge Summary (Signed)
PATIENT NAME:  Elaine Ford, Elaine Ford MR#:  [id number] DATE OF BIRTH:  1945-07-29  DATE OF ADMISSION:  07/22/2014 DATE OF DISCHARGE:    DISPOSITION: To [facility].   REASON FOR TRANSFER TO [place]: The patient had a complete heart block and needs to have a permanent pacemaker placement as soon as possible. The patient is going to [facility], accepting physician is Dr. Lovena Le and Dr. Fletcher Anon has arranged the transfer.   DISCHARGE DIAGNOSES: 1.  Third degree arteriovenous block with a junctional escape rhythm.  2.  Hypertension.   HOSPITAL COURSE: A 69 year old female with hypertension, hyperlipidemia, morbid obesity comes in because of 4 day history of fatigue, intermittent chest pain, and headache. The patient was on atenolol 25 mg at home. She went to urgent care because of the fatigue and she was found to have a heart rate of 30s. The patient found to have a second degree AV block and she was admitted to ICU for bradycardia. The patient continued to have a 2:1 AV block with heart rate ranging 20s to 40s. The patient's  troponins have been negative. We monitored in the ICU for a day and has her pacer (pads stand by,we, stopped the atenolol. The patient was taken to cardiac catheterization by Dr. Fletcher Anon and cardiac catheterization was normal without any ischemia. The patient found to be in complete heart block at this time. The patient is going to [facility] for a permanent pacemaker placement. The patient's other diagnosis include hypertension. The patient does have hypertension at this time with BP in 160s/80s with compensatory mechanism. The patient's blood pressure has been evaluated by Dr. Fletcher Anon and he is not concerned about the blood pressure at this time because of the compensatory mechanism. She is going to [facility] for a  permanent pacemaker and EP studies.    PERTINENT LABORATORY DATA: Echocardiogram showed EF 65% to 70%  with normal LV function. The patient's LDL was 85. WBC 8.9, hemoglobin 13.6, hematocrit 42.7,  platelets 246,000. Electrolytes: Sodium is 141, potassium 3.9, chloride 106, bicarbonate 26, BUN 12, creatinine 0.87, glucose 87. The patient's troponins have been negative x 3. The patient's UA was clear.   TIME SPENT ON THIS DISCHARGE SUMMARY: More than 30 minutes.   The patient will go to [facility] and the arrangements are made.    ____________________________ Epifanio Lesches, MD sk:at D: 07/24/2014 10:45:30 ET T: 07/24/2014 10:54:36 ET JOB#: [id number]  cc: Epifanio Lesches, MD, <Dictator> Epifanio Lesches MD ELECTRONICALLY SIGNED 08/12/2014 12:01

## 2014-11-09 NOTE — Consult Note (Signed)
General Aspect Primary Cardiologist: Dr. Rockey Situ, MD ________________  69 year old female with history of HTN, HLD, LBBB, and morbid obesity who presented to [facility] on 07/22/14 with 4 day history of increased fatigue, intermittent chest pain, and headaches, on atenolol 25 mg and aspirin 81 mg daily, who was found to have bradycardia of 30 and hypertension of 170s-200s/50s upon her admission.  ________________ PMH: 1. HTN 2. HLD 3. LBBB 4. Morbid obesity 5. Family history of CAD ________________   Present Illness 69 year old female with the above problem list who presented to [facility] on 07/22/14 with 4 day history of increased fatigue, intermittent chest pain, and headaches, on atenolol 25 mg and aspirin 81 mg daily, who was found to have bradycardia of 30 and hypertension of 170s-200s/50s upon her admission.   Patient was initially seen by Dr. Rockey Situ in 05/2013 in with several month history of intermittent chest pain at that time. She has significant family history of CAD with a father and brother that have both had CAD s/p MI/CABG. She underwent a Lexiscan Myoview at that time that showed no significant ischemia, EF 69%. She was on Norvasc, lovastatin, and Hyzaar at that time. She did not follow up until 01/2014. At that time several of her medications had been changed by outside provider. She was now on atenolol, no longer on Hyzaar, Norvasc, or lovastatin. Pulse was 59, EKG was with LBBB at that time. She was doing well at that time.  Over the past 4 days she has noted increased fatigue, headaches and intermittent chest pains. She checks her BP daily at home and over the past 4 days her home BP cuff has not been able to get a pulse reading. Her BP readings were elevated, but she cannot reacall the readings. She continued to take both her aspirin 81 mg and atenolol 25 mg daily. She denies taking any extra atenolol. She called her PCP but they could not see her. She presented to a local urgent care nad  was found to have a pulse of 30. EKG with AV dissociasion. She was sent to [facility] for further evaluation.   Upon her arrival to [facility] she was found to be bradycardic with HR in the 30s, BP initially in the 170s, currently in the 200s/50s. Pacer pads in place. K+ 4.4-->3.9. Troponin negative. She was given HCTZ 25 mg by IM and enalapril 1.25 mg IV. She is asymptomatic currently and resting comfortably in the bed. Atrial rate is in the 60s with a ventricular rate in the 30s currently. EKG with 2:1 AV block.   Physical Exam:  GEN no acute distress, obese   HEENT hearing intact to voice   RESP normal resp effort  clear BS   CARD Bradycardic  Murmur   Murmur Systolic   ABD denies tenderness  soft   EXTR negative edema   SKIN normal to palpation   NEURO cranial nerves intact   PSYCH alert   Review of Systems:  General: Fatigue   Skin: No Complaints   ENT: No Complaints   Eyes: No Complaints   Neck: No Complaints   Respiratory: No Complaints   Cardiovascular: Chest pain or discomfort   Gastrointestinal: No Complaints   Genitourinary: No Complaints   Vascular: No Complaints   Musculoskeletal: No Complaints   Neurologic: [place]  Headache   Hematologic: No Complaints   Endocrine: No Complaints   Psychiatric: No Complaints   Review of Systems: All other systems were reviewed and found  to be negative   Medications/Allergies Reviewed Medications/Allergies reviewed   Family & Social History:  Family and Social History:  Family History father: CAD s/p CABG; brother: CAD, mother: good health   Social History negative tobacco, negative ETOH, negative Illicit drugs   Place of Living Home     HTN:    Cholecystectomy:     ECG, -Interpreted by-heart station physician-Reason for ecg -Chest Pain, [place], 23-Jul-2014, Performed, Standard  Home Medications: Medication Instructions Status  aspirin 81 mg oral tablet 1 tab(s) orally once a day Active  atenolol 25 mg  oral tablet 1 tab(s) orally once a day Active   Lab Results:  Routine Chem:  12-Jan-16 17:34   Creatinine (comp) 0.87  Potassium, Serum 4.4  13-Jan-16 02:00   Cholesterol, Serum 147  Triglycerides, Serum 127  HDL (INHOUSE)  38  VLDL Cholesterol Calculated 25  LDL Cholesterol Calculated 84 (Result(s) reported on 23 Jul 2014 at 03:10AM.)  Glucose, Serum 87  BUN 12  Creatinine (comp) 0.87  Sodium, Serum 141  Potassium, Serum 3.9  Chloride, Serum 106  CO2, Serum 26  Calcium (Total), Serum 8.6  Anion Gap 9  Osmolality (calc) 280  eGFR (African American) >60  eGFR (Non-African American) >60 (eGFR values <74m/min/1.73 m2 may be an indication of chronic kidney disease (CKD). Calculated eGFR, using the MRDR Study equation, is useful in  patients with stable renal function. The eGFR calculation will not be reliable in acutely ill patients when serum creatinine is changing rapidly. It is not useful in patients on dialysis. The eGFR calculation may not be applicable to patients at the low and high extremes of body sizes, pregnant women, and vegetarians.)  Cardiac:  12-Jan-16 17:34   Troponin I < 0.02 (0.00-0.05 0.05 ng/mL or less: NEGATIVE  Repeat testing in 3-6 hrs  if clinically indicated. >0.05 ng/mL: POTENTIAL  MYOCARDIAL INJURY. Repeat  testing in 3-6 hrs if  clinically indicated. NOTE: An increase or decrease  of 30% or more on serial  testing suggests a  clinically important change)    21:44   Troponin I < 0.02 (0.00-0.05 0.05 ng/mL or less: NEGATIVE  Repeat testing in 3-6 hrs  if clinically indicated. >0.05 ng/mL: POTENTIAL  MYOCARDIAL INJURY. Repeat  testing in 3-6 hrs if  clinically indicated. NOTE: An increase or decrease  of 30% or more on serial  testing suggests a  clinically important change)  13-Jan-16 02:00   Troponin I < 0.02 (0.00-0.05 0.05 ng/mL or less: NEGATIVE  Repeat testing in 3-6 hrs  if clinically indicated. >0.05 ng/mL: POTENTIAL   MYOCARDIAL INJURY. Repeat  testing in 3-6 hrs if  clinically indicated. NOTE: An increase or decrease  of 30% or more on serial  testing suggests a  clinically important change)  CK, Total 46  CPK-MB, Serum 0.7 (Result(s) reported on 23 Jul 2014 at 03:04AM.)  Routine Hem:  13-Jan-16 02:00   WBC (CBC) 8.9  RBC (CBC) 5.01  Hemoglobin (CBC) 13.6  Hematocrit (CBC) 42.7  Platelet Count (CBC) 246  MCV 85  MCH 27.2  MCHC  31.9  RDW 14.2  Neutrophil % 71.4  Lymphocyte % 21.5  Monocyte % 6.0  Eosinophil % 0.5  Basophil % 0.6  Neutrophil # 6.4  Lymphocyte # 1.9  Monocyte # 0.5  Eosinophil # 0.0  Basophil # 0.1 (Result(s) reported on 23 Jul 2014 at 02:49AM.)   Radiology Results: XRay:    12-Jan-16 18:28, Chest Portable Single View  Chest Portable Single View  REASON FOR EXAM:    chest pain, bradycardia  COMMENTS:       PROCEDURE: DXR - DXR PORTABLE CHEST SINGLE VIEW  - Jul 22 2014  6:28PM     CLINICAL DATA:  Shortness of breath, chest pain.    EXAM:  PORTABLE CHEST - 1 VIEW    COMPARISON:  None.    FINDINGS:  The heart size and mediastinal contours are within normal limits.  Both lungs are clear. No pneumothorax or pleural effusion is noted.  Defibrillator pads overlie the left chest. The visualized skeletal  structures are unremarkable.     IMPRESSION:  No acute cardiopulmonary abnormality seen.      Electronically Signed    By: Sabino Dick M.D.    On: 07/22/2014 18:30         Verified By: Marveen Reeks, M.D.,    No Known Allergies:   Vital Signs/Nurse's Notes: **Vital Signs.:   13-Jan-16 09:00  Vital Signs Type Routine  Pulse Pulse 34  Pulse source if not from Vital Sign Device per cardiac monitor  Respirations Respirations 20  Systolic BP Systolic BP 063  Diastolic BP (mmHg) Diastolic BP (mmHg) 60  Mean BP 117  BP Source  if not from Vital Sign Device non-invasive  Pulse Ox % Pulse Ox % 99  Pulse Ox Activity Level  At rest  Oxygen Delivery  2L; Nasal Cannula  Pulse Ox Heart Rate 79    Impression 69 year old female with history of HTN, HLD, LBBB, and morbid obesity who presented to [facility] on 07/22/14 with 4 day history of increased fatigue, intermittent chest pain, and headaches, on atenolol 25 mg and aspirin 81 mg daily, who was found to have bradycardia of 30 and hypertension of 170s-200s/50s upon her admission.   1. Bradycardia: -Currently with 2:1 AV block with HR varying between mid 30s-40s, asymptomatic -Pacer pads are in place -Should she decline would need to either tanscutaneously pace or take to cath lab for venous temp wire placement -Continue to hold all rate limting agents -Discuss case with Dr. Rockey Situ for possible cardiac cath on 07/24/14 to evaluate for ischemia (RCA) as a possible etiology of her bradycardia -Continue to watch her over the next 24 hours hours, if cath is pursued and nondiagnostic and HR remains bradycardic would need transfer to [facility] for EP evaluation for possible PPM  2. HTN: -This is compensatory given her significant bradycardia (see pulse pressures) -Would use afterload reducing agents to keep SBP around 160-180 as she needs this for perfusion   Electronic Signatures for Addendum Section:  Leonie Man (MD) (Signed Addendum 13-Jan-16 19:08)  I have seen & examined the patient this AM & discussed with Mr. Idolina Primer (as well as Dr. Mortimer Fries from [facility]).  Very pleasant woman with a relatively negative cardiac work-up to date who is on Atenolol for HTN.  Admitted with HTN & 2:1 AV Block -- cannot determine Type 1 or 2 2nd Degree block.   At this point, cannot determine if this is BB related or a primary AV nodal block.  HTN with high systolic pressure may well be compensatory for 2:1 AVB --would not be overly aggressive with Rx (target SBP < 180).  For now, continue to hold BB (use ACE-I or Hydralazine for BP control) -- if rate recovers, then would simply hold BB. However, if she remains in 2:1 AVB - would  need to consider primary bradyarrythmia.  Would then need to consider excluding an ischemic etiology &  then likely EP consultation for PPM.  DH   Electronic Signatures: Rise Mu (PA-C)  (Signed 13-Jan-16 10:59)  Authored: General Aspect/Present Illness, History and Physical Exam, Review of System, Family & Social History, Past Medical History, Orders, Home Medications, Labs, EKG , Radiology, Allergies, Vital Signs/Nurse's Notes, Impression/Plan Leonie Man (MD)  (Signed 13-Jan-16 19:08)  Co-Signer: General Aspect/Present Illness, History and Physical Exam, Review of System, Family & Social History, Past Medical History, Orders, Home Medications, Labs, EKG , Radiology, Allergies, Vital Signs/Nurse's Notes, Impression/Plan   Last Updated: 13-Jan-16 19:08 by Leonie Man (MD)

## 2014-11-09 NOTE — H&P (Signed)
PATIENT NAME:  Elaine Ford, Elaine Ford MR#:  [id number] DATE OF BIRTH:  1945-11-26  DATE OF ADMISSION:  07/22/2014  REFERRING EMERGENCY ROOM PHYSICIAN: Hinda Kehr, MD.    PRIMARY CARE PHYSICIAN: Enid Derry, MD.   PRIMARY CARDIOLOGIST: Ida Rogue, MD.    CHIEF COMPLAINT: Fatigue.   HISTORY OF PRESENT ILLNESS: This very pleasant 69 year old woman with past medical history of hypertension presents today at from the [facility] urgent care clinic with bradycardia. She reports that she has been feeling poorly for the past 4 days. She has had a headache, mild chest pain, numbness in both hands, and a dry cough with sore throat. She denies fevers, has had occasional chills. No nausea, vomiting, or diarrhea. She describes a chest pressure which feels like a band around her lower chest which is present at all times, nothing makes it better or worse. She does have insomnia chronically, but recently has had to sit up to sleep. She has been feeling very weak, no presyncope or syncope. Her home blood pressure cuff has been reading normal to high blood pressures with heart rates too low to read for the past 2-3 days. On presentation to the Emergency Room her heart rate is in the 30s. Hospitalist services are asked to admit for further evaluation and treatment.   PAST MEDICAL HISTORY:  1.  Hypertension.  2.  Osteoarthritis of the right knee.   SOCIAL HISTORY: The patient lives with her husband. She does not smoke cigarettes or drink alcohol or use illicit substances. She does not use a cane, walker, or supplemental oxygen at home.   FAMILY MEDICAL HISTORY: Positive for coronary artery disease in her father and multiple uncles. Positive for stroke in her father.   ALLERGIES: No known allergies.   HOME MEDICATIONS:  1.  Atenolol 25 mg 1 tablet daily.  2.  Aspirin 81 mg 1 tablet daily.   REVIEW OF SYSTEMS:  CONSTITUTIONAL: Positive for weakness, fatigue. No fever, pain, change in weight.  HEENT: Negative for  change in her hearing or vision, no pain in eyes or ears. No sinus congestion or difficulty swallowing. She does have a sore throat from coughing.  RESPIRATORY: Positive for cough, no wheezing, hemoptysis, or asthma, no painful respirations or COPD.   CARDIOVASCULAR: Positive for chest pain as described above, positive for orthopnea, mild edema, arrhythmia. No syncope.  GASTROINTESTINAL: Negative for nausea, vomiting, diarrhea, abdominal pain, hematemesis, melena, constipation, or reflux.  GENITOURINARY: Negative for dysuria or frequency.  HEMATOLOGIC: No easy bruising or bleeding.  SKIN: No recent rash, skin lesions, or skin changes.  MUSCULOSKELETAL: No new pain in the neck, back, shoulders, knees, or hips, she does have arthritis in the right knee, no gout.  No recent joint swelling.  NEUROLOGIC: No focal numbness or weakness, no seizures, stroke, dementia or memory loss. Positive for headache.  PSYCHIATRIC: No bipolar disorder, schizophrenia, she does have white coat hypertension and anxiety in healthcare setting.   PHYSICAL EXAMINATION:  VITAL SIGNS: Temperature 98.8, pulse 35, respirations 16, blood pressure 184/59, oxygenation 98% on room air.  GENERAL: No acute distress.  HEENT: Pupils are equal, round, and reactive, she does have some haziness possibly suggesting bilateral cataracts, conjunctivae are clear, extraocular motion is intact, oral mucous membranes are pink and moist, posterior oropharynx is clear with no exudate, edema, or erythema, good dentition, neck is supple, no cervical lymphadenopathy, trachea midline, thyroid nontender.  RESPIRATORY: Lungs are clear to auscultation bilaterally with good air movement.  CARDIOVASCULAR: Bradycardic with a rate  in the 30s, no murmurs, rubs, or gallops, no carotid bruit, there is trace pitting edema bilaterally, peripheral pulses are 2 +.  ABDOMEN: Soft, nontender, nondistended, no hepatosplenomegaly, no guarding, no rebound, bowel sounds are  normal.  MUSCULOSKELETAL: No joint effusions, range of motion is normal, strength is 5 out of 5 throughout.  SKIN: No rashes, diaphoresis, open wounds, or lesions.  NEUROLOGIC: Cranial nerves II through XII grossly intact, strength and sensation intact, tone is normal.  PSYCHIATRIC: The patient is very anxious at this time, she is alert and oriented x 4 with fairly good insight into her clinical condition.   LABORATORY DATA: Sodium 138, potassium 4.4, chloride 105, bicarbonate 22, BUN 11, creatinine 0.87, glucose 119, calcium 8.8, magnesium 2.0, lipase 62. Ethanol less than 3. Total protein 7.8, albumin 3.1, bilirubin 0.7, alkaline phosphatase 83, AST is 89, ALT is 123. Troponin less than 0.02. White blood cells 10.1, hemoglobin 14.0, platelets 261,000, MCV 85. INR is 1.1. UA is negative for signs of infection, no proteinuria.   EKG shows sinus bradycardia with second degree AV block, 2:1 AV conduction. Right axis deviation, left bundle branch block.   Chest x-ray, no acute cardiopulmonary abnormality seen.    ASSESSMENT AND PLAN:  1.  Bradycardia: Second degree AV block with 2:1 conduction and a left bundle branch block. Dr. Karma Greaser has discussed this case with Dr. Percival Spanish.  For now the patient has Zoll pads in place. Her blood pressure is robust, she is mentating well and has no overt laboratory abnormalities. We will cycle cardiac enzymes. We will admit to the CCU.  Should she become symptomatic due to bradycardia would start a dopamine drip. We will order a 2-D echocardiogram. We will certainly hold her beta blocker and hopefully this will improve her bradycardia somewhat.  2.  Hypertension: Her blood pressure is fairly elevated. Stop atenolol. Would not treat her blood pressure at this time as she needs the added pressure for perfusion in the setting of profound bradycardia. Once her bradycardia has improved she would benefit from an additional antihypertensive such as lisinopril.  3.   Transaminitis: We will add a hepatitis panel to her laboratories. Once she is more stable she will need an abdominal ultrasound for further evaluation. She is not having any right upper quadrant pain to suggest acute cholecystitis at this time.  4.  Prophylaxis: Heparin for DVT prophylaxis. No GI prophylaxis at this time.  5.  Anxiety: The patient reports that she has significant anxiety in healthcare settings. At this time I will not prescribe any anxiolytics as I would not want to sedate this patient.  TIME SPENT ON ADMISSION: 50 minutes.   ____________________________ Earleen Newport. Volanda Napoleon, MD cpw:bu D: 07/22/2014 19:41:08 ET T: 07/22/2014 20:15:34 ET JOB#: [id number]  cc: Barnetta Chapel P. Volanda Napoleon, MD, <Dictator> Aldean Jewett MD ELECTRONICALLY SIGNED 07/22/2014 22:20

## 2014-12-15 ENCOUNTER — Encounter: Payer: Self-pay | Admitting: Family Medicine

## 2015-01-27 ENCOUNTER — Telehealth: Payer: Self-pay | Admitting: Cardiology

## 2015-01-27 ENCOUNTER — Ambulatory Visit (INDEPENDENT_AMBULATORY_CARE_PROVIDER_SITE_OTHER): Payer: Commercial Managed Care - HMO | Admitting: *Deleted

## 2015-01-27 DIAGNOSIS — I442 Atrioventricular block, complete: Secondary | ICD-10-CM | POA: Diagnosis not present

## 2015-01-27 NOTE — Telephone Encounter (Signed)
Confirmed remote transmission w/ pt husband.   

## 2015-01-27 NOTE — Progress Notes (Signed)
Remote pacemaker transmission.   

## 2015-02-11 LAB — CUP PACEART REMOTE DEVICE CHECK
Battery Remaining Longevity: 128 mo
Battery Voltage: 3.01 V
Brady Statistic AP VS Percent: 1 %
Brady Statistic AS VP Percent: 85 %
Brady Statistic RA Percent Paced: 15 %
Lead Channel Pacing Threshold Amplitude: 0.375 V
Lead Channel Pacing Threshold Amplitude: 0.75 V
Lead Channel Pacing Threshold Pulse Width: 0.4 ms
Lead Channel Sensing Intrinsic Amplitude: 11 mV
Lead Channel Setting Pacing Amplitude: 1 V
Lead Channel Setting Pacing Amplitude: 1.375
Lead Channel Setting Sensing Sensitivity: 4 mV
MDC IDC MSMT BATTERY REMAINING PERCENTAGE: 95.5 %
MDC IDC MSMT LEADCHNL RA IMPEDANCE VALUE: 460 Ohm
MDC IDC MSMT LEADCHNL RA SENSING INTR AMPL: 5 mV
MDC IDC MSMT LEADCHNL RV IMPEDANCE VALUE: 740 Ohm
MDC IDC MSMT LEADCHNL RV PACING THRESHOLD PULSEWIDTH: 0.4 ms
MDC IDC SESS DTM: 20160719172115
MDC IDC SET LEADCHNL RV PACING PULSEWIDTH: 0.4 ms
MDC IDC STAT BRADY AP VP PERCENT: 15 %
MDC IDC STAT BRADY AS VS PERCENT: 1 %
MDC IDC STAT BRADY RV PERCENT PACED: 99 %
Pulse Gen Model: 2240
Pulse Gen Serial Number: 7706772

## 2015-02-24 ENCOUNTER — Encounter: Payer: Self-pay | Admitting: Cardiology

## 2015-03-05 ENCOUNTER — Encounter: Payer: Self-pay | Admitting: Internal Medicine

## 2015-05-04 ENCOUNTER — Ambulatory Visit (INDEPENDENT_AMBULATORY_CARE_PROVIDER_SITE_OTHER): Payer: Commercial Managed Care - HMO | Admitting: *Deleted

## 2015-05-04 DIAGNOSIS — I442 Atrioventricular block, complete: Secondary | ICD-10-CM | POA: Diagnosis not present

## 2015-05-04 NOTE — Progress Notes (Signed)
Remote pacemaker transmission.   

## 2015-05-07 ENCOUNTER — Encounter: Payer: Self-pay | Admitting: Cardiology

## 2015-05-07 LAB — CUP PACEART REMOTE DEVICE CHECK
Battery Remaining Longevity: 127 mo
Battery Remaining Percentage: 95.5 %
Battery Voltage: 3.01 V
Brady Statistic AP VS Percent: 1 %
Brady Statistic AS VS Percent: 1 %
Brady Statistic RV Percent Paced: 99 %
Implantable Lead Implant Date: 20160114
Implantable Lead Implant Date: 20160114
Implantable Lead Location: 753859
Lead Channel Impedance Value: 480 Ohm
Lead Channel Pacing Threshold Amplitude: 0.375 V
Lead Channel Pacing Threshold Amplitude: 0.75 V
Lead Channel Pacing Threshold Pulse Width: 0.4 ms
Lead Channel Sensing Intrinsic Amplitude: 5 mV
Lead Channel Setting Pacing Amplitude: 1 V
Lead Channel Setting Pacing Pulse Width: 0.4 ms
Lead Channel Setting Sensing Sensitivity: 4 mV
MDC IDC LEAD LOCATION: 753860
MDC IDC MSMT LEADCHNL RA PACING THRESHOLD PULSEWIDTH: 0.4 ms
MDC IDC MSMT LEADCHNL RV IMPEDANCE VALUE: 740 Ohm
MDC IDC MSMT LEADCHNL RV SENSING INTR AMPL: 11 mV
MDC IDC PG SERIAL: 7706772
MDC IDC SESS DTM: 20161024065836
MDC IDC SET LEADCHNL RA PACING AMPLITUDE: 1.375
MDC IDC STAT BRADY AP VP PERCENT: 21 %
MDC IDC STAT BRADY AS VP PERCENT: 79 %
MDC IDC STAT BRADY RA PERCENT PACED: 21 %

## 2015-05-15 DIAGNOSIS — I10 Essential (primary) hypertension: Secondary | ICD-10-CM | POA: Diagnosis not present

## 2015-05-15 DIAGNOSIS — E785 Hyperlipidemia, unspecified: Secondary | ICD-10-CM | POA: Diagnosis not present

## 2015-05-15 DIAGNOSIS — I459 Conduction disorder, unspecified: Secondary | ICD-10-CM | POA: Diagnosis not present

## 2015-05-15 DIAGNOSIS — L309 Dermatitis, unspecified: Secondary | ICD-10-CM | POA: Diagnosis not present

## 2015-07-28 ENCOUNTER — Encounter: Payer: Self-pay | Admitting: Internal Medicine

## 2015-07-28 ENCOUNTER — Ambulatory Visit (INDEPENDENT_AMBULATORY_CARE_PROVIDER_SITE_OTHER): Payer: Commercial Managed Care - HMO | Admitting: Internal Medicine

## 2015-07-28 VITALS — BP 230/90 | HR 80 | Ht 64.0 in | Wt 196.8 lb

## 2015-07-28 DIAGNOSIS — E669 Obesity, unspecified: Secondary | ICD-10-CM

## 2015-07-28 DIAGNOSIS — I442 Atrioventricular block, complete: Secondary | ICD-10-CM | POA: Diagnosis not present

## 2015-07-28 DIAGNOSIS — Z950 Presence of cardiac pacemaker: Secondary | ICD-10-CM | POA: Diagnosis not present

## 2015-07-28 LAB — CUP PACEART INCLINIC DEVICE CHECK
Battery Voltage: 3.01 V
Brady Statistic RA Percent Paced: 24 %
Brady Statistic RV Percent Paced: 99.99 %
Implantable Lead Implant Date: 20160114
Implantable Lead Implant Date: 20160114
Implantable Lead Location: 753859
Lead Channel Impedance Value: 450 Ohm
Lead Channel Impedance Value: 737.5 Ohm
Lead Channel Pacing Threshold Amplitude: 0.5 V
Lead Channel Pacing Threshold Amplitude: 0.75 V
Lead Channel Pacing Threshold Pulse Width: 0.4 ms
Lead Channel Pacing Threshold Pulse Width: 0.4 ms
Lead Channel Pacing Threshold Pulse Width: 0.4 ms
Lead Channel Setting Pacing Amplitude: 1 V
Lead Channel Setting Pacing Amplitude: 1.375
Lead Channel Setting Pacing Pulse Width: 0.4 ms
Lead Channel Setting Sensing Sensitivity: 4 mV
MDC IDC LEAD LOCATION: 753860
MDC IDC MSMT BATTERY REMAINING LONGEVITY: 124.8
MDC IDC MSMT LEADCHNL RA PACING THRESHOLD AMPLITUDE: 0.5 V
MDC IDC MSMT LEADCHNL RA SENSING INTR AMPL: 4.5 mV
MDC IDC MSMT LEADCHNL RV PACING THRESHOLD AMPLITUDE: 0.75 V
MDC IDC MSMT LEADCHNL RV PACING THRESHOLD PULSEWIDTH: 0.4 ms
MDC IDC SESS DTM: 20170117160927
Pulse Gen Serial Number: 7706772

## 2015-07-28 MED ORDER — CARVEDILOL 25 MG PO TABS: 25.0000 mg | ORAL_TABLET | Freq: Two times a day (BID) | ORAL | Status: DC

## 2015-07-28 NOTE — Progress Notes (Signed)
HPI Elaine Ford returns today for followup. She is a very pleasant 70 year old woman with a history of hypertension and complete heart block, status post permanent pacemaker insertion. She had some very mild swelling of her upper left arm. She denies chest pain or shortness of breath otherwise. No syncope. Her blood pressure has been a little high at home but she also has white coat syndrome. She c/o numbness in the fingers of her right hand which has been present since her cardiac cath which was performed from the right radial approach.  Allergies  Allergen Reactions  . Codeine     unknown  . Lisinopril-Hydrochlorothiazide Cough     Current Outpatient Prescriptions  Medication Sig Dispense Refill  . acetaminophen (TYLENOL) 325 MG tablet Take 2 tablets (650 mg total) by mouth every 4 (four) hours as needed for headache or mild pain.    Marland Kitchen aspirin 81 MG tablet Take 81 mg by mouth daily.    . benzonatate (TESSALON) 200 MG capsule Take 1 capsule (200 mg total) by mouth 3 (three) times daily as needed for cough. 20 capsule 1  . labetalol (NORMODYNE) 200 MG tablet Take 1 tablet (200 mg total) by mouth 2 (two) times daily. 60 tablet 1  . traMADol (ULTRAM) 50 MG tablet Take 1 tablet (50 mg total) by mouth every 12 (twelve) hours as needed for moderate pain or severe pain. 30 tablet 0   No current facility-administered medications for this visit.     Past Medical History  Diagnosis Date  . Hypertension   . Hyperlipidemia   . Hemorrhoid   . Gall bladder stones   . Pacemaker-St Jude 07/26/2014  . Hx of cardiovascular stress test     Myoview 11/14:  No ischemia  . Hx of cardiac cath     [facility] 07/24/14 at [facility] - no significant CAD  . Hx of echocardiogram     Echo 07/23/14 - at [facility]:  EF 65-70%, impaired relaxation, mild LAE, mild MR, mild AI, mild elevated PASP (44.2 mmHg)  . S/P cardiac pacemaker procedure 07/2014    [employer]. Jude - Dr. Lovena Le  . LBBB (left bundle branch block)     ROS:   All systems reviewed and negative except as noted in the HPI.   Past Surgical History  Procedure Laterality Date  . Gallbladder surgery    . Permanent pacemaker insertion N/A 07/24/2014    Procedure: PERMANENT PACEMAKER INSERTION;  Surgeon: [name], MD;  Location: [facility] CATH LAB;  Service: Cardiovascular;  Laterality: N/A;  . Lead revision N/A 07/25/2014    Procedure: LEAD REVISION;  Surgeon: Deboraha Sprang, MD;  Location: [facility] CATH LAB;  Service: Cardiovascular;  Laterality: N/A;     Family History  Problem Relation Age of Onset  . Heart attack Father   . Heart disease Brother   . Stroke Father      Social History   Social History  . Marital Status: Married    Spouse Name: N/A  . Number of Children: N/A  . Years of Education: N/A   Occupational History  . Not on file.   Social History Main Topics  . Smoking status: Never Smoker   . Smokeless tobacco: Not on file  . Alcohol Use: No  . Drug Use: No  . Sexual Activity: Yes    Birth Control/ Protection: Post-menopausal   Other Topics Concern  . Not on file   Social History Narrative     BP  230/90 mmHg  Pulse 80  Ht 5\' 4"  (1.626 m)  Wt 196 lb 12.8 oz (89.268 kg)  BMI 33.76 kg/m2 204/92 by me Physical Exam:  Well appearing 70 year old woman, NAD HEENT: Unremarkable Neck:  7 cm JVD, no thyromegally Back:  No CVA tenderness Lungs:  Clear, with no wheezes, rales, or rhonchi. Pacemaker incision is well healed HEART:  Regular rate rhythm, no murmurs, no rubs, no clicks, soft S4 Abd:  soft, positive bowel sounds, no organomegally, no rebound, no guarding Ext:  2 plus pulses, trace peripheral edema, no cyanosis, no clubbing Skin:  No rashes no nodules Neuro:  CN II through XII intact, motor grossly intact   DEVICE  Normal device function.  See PaceArt for details.   Assess/Plan:

## 2015-07-28 NOTE — Assessment & Plan Note (Signed)
Her blood pressure is not well controlled but she also has a component of white coat HTN. I have asked the patient to switch to coreg from labetolol. She is instructed to chest her bp at home and to return for a bp check in the office in 2 weeks.

## 2015-07-28 NOTE — Assessment & Plan Note (Signed)
She is encouraged to lose weight.  

## 2015-07-28 NOTE — Assessment & Plan Note (Signed)
Her St. Jude DDD PM is working normally. Will recheck in several months.  

## 2015-07-28 NOTE — Patient Instructions (Signed)
Medication Instructions:  Your physician has recommended you make the following change in your medication:  1) Stop Labetalol  2) Start Carvedilol 25 mg twice daily   Labwork: None ordered   Testing/Procedures: None ordered   Follow-Up: Your physician recommends that you schedule a follow-up appointment in: 2 weeks for a BP check in nurse room  Your physician wants you to follow-up in: 12 months with Dr Knox Saliva will receive a reminder letter in the mail two months in advance. If you don't receive a letter, please call our office to schedule the follow-up appointment.    Any Other Special Instructions Will Be Listed Below (If Applicable).     If you need a refill on your cardiac medications before your next appointment, please call your pharmacy.

## 2015-08-11 ENCOUNTER — Ambulatory Visit (INDEPENDENT_AMBULATORY_CARE_PROVIDER_SITE_OTHER): Payer: Commercial Managed Care - HMO | Admitting: Pharmacist

## 2015-08-11 VITALS — BP 142/84 | HR 63

## 2015-08-11 DIAGNOSIS — I10 Essential (primary) hypertension: Secondary | ICD-10-CM

## 2015-08-11 NOTE — Progress Notes (Signed)
Patient ID: LACANDICE GOGA                 DOB: 04-28-2046, 70 yo                         MRN: [id number]     HPI: Elaine Ford is a 70 y.o. female referred by Dr. Lovena Le to HTN clinic. PMH is significant for HTN and complete heart block s/p permanent pacemaker insertion. Pt has white coat HTN and at last visit with Dr. Lovena Le 2 weeks ago, BP was elevated to 230/90. Her labetalol was switched to carvedilol at that time. Pt presents today for BP f/u with her husband and a [place] interpreter.  Pt reports that she has been tolerating her Coreg well. She had some dizziness for a day or so early on but this has resolved. Her husband reports that she also had a headache for a few days as she got used to the carvedilol but that this is gone as well.  She checks her BP daily at home using a wrist cuff which she brings to clinic today along with a list of home BP readings from the past 2 weeks since med change: Most systolic readings are [id number] with an occasional reading in the 140s. Diastolic readings stable in the 60s, HR stable in the 60s.  Calibrated home BP cuff with clinic cuff: Home reading: 138/76, HR 61. Clinic reading: 142/84, HR 63 - wrist cuff more accurate than expected.  Current HTN meds: Coreg 25mg  BID  Previously tried: lisinopril-HCTZ - cough d/t ACEi BP goal: <150/37mmHg  Family History: Father with heart attack and stroke, brother with heart disease.  Social History: Pt reports that she does not smoke cigarettes, drink alcohol, or use illicit drugs.   Wt Readings from Last 3 Encounters:  07/28/15 196 lb 12.8 oz (89.268 kg)  10/28/14 204 lb (92.534 kg)  07/27/14 211 lb 11.2 oz (96.026 kg)   BP Readings from Last 3 Encounters:  07/28/15 230/90  10/28/14 132/80  08/29/14 121/83   Pulse Readings from Last 3 Encounters:  07/28/15 80  10/28/14 68  08/29/14 67    Renal function: CrCl cannot be calculated (Unknown ideal weight.).  Past Medical History  Diagnosis Date    . Hypertension   . Hyperlipidemia   . Hemorrhoid   . Gall bladder stones   . Pacemaker-St Jude 07/26/2014  . Hx of cardiovascular stress test     Myoview 11/14:  No ischemia  . Hx of cardiac cath     [facility] 07/24/14 at [facility] - no significant CAD  . Hx of echocardiogram     Echo 07/23/14 - at [facility]:  EF 65-70%, impaired relaxation, mild LAE, mild MR, mild AI, mild elevated PASP (44.2 mmHg)  . S/P cardiac pacemaker procedure 07/2014    [employer]. Jude - Dr. Lovena Le  . LBBB (left bundle branch block)     Current Outpatient Prescriptions on File Prior to Visit  Medication Sig Dispense Refill  . acetaminophen (TYLENOL) 325 MG tablet Take 2 tablets (650 mg total) by mouth every 4 (four) hours as needed for headache or mild pain.    Marland Kitchen aspirin 81 MG tablet Take 81 mg by mouth daily.    . benzonatate (TESSALON) 200 MG capsule Take 1 capsule (200 mg total) by mouth 3 (three) times daily as needed for cough. 20 capsule 1  . carvedilol (COREG) 25 MG tablet Take 1 tablet (25 mg  total) by mouth 2 (two) times daily. 180 tablet 3  . traMADol (ULTRAM) 50 MG tablet Take 1 tablet (50 mg total) by mouth every 12 (twelve) hours as needed for moderate pain or severe pain. 30 tablet 0   No current facility-administered medications on file prior to visit.    Allergies  Allergen Reactions  . Codeine     unknown  . Lisinopril-Hydrochlorothiazide Cough     Assessment/Plan:  1. Hypertension - BP much improved at today's visit and office BP plus all home BP readings are at goal <150/55mmHg. Will continue on carvedilol 25mg  BID. Advised pt that if she starts to see her BP consistently elevated above 150 to call clinic. Otherwise, she will plan to follow up with Dr. Lovena Le in a year.   Shoni Quijas E. Karsen Fellows, PharmD, [place] [id number] [address] [place], [place] [address] Phone: [phone]; Fax: [phone] 08/11/2015 4:20 PM

## 2015-08-21 DIAGNOSIS — Z1231 Encounter for screening mammogram for malignant neoplasm of breast: Secondary | ICD-10-CM | POA: Diagnosis not present

## 2015-08-25 ENCOUNTER — Encounter: Payer: Self-pay | Admitting: Family Medicine

## 2015-08-25 ENCOUNTER — Ambulatory Visit (INDEPENDENT_AMBULATORY_CARE_PROVIDER_SITE_OTHER): Payer: Commercial Managed Care - HMO | Admitting: Family Medicine

## 2015-08-25 VITALS — Ht 64.0 in | Wt 197.8 lb

## 2015-08-25 DIAGNOSIS — E785 Hyperlipidemia, unspecified: Secondary | ICD-10-CM | POA: Diagnosis not present

## 2015-08-25 DIAGNOSIS — I10 Essential (primary) hypertension: Secondary | ICD-10-CM

## 2015-08-25 DIAGNOSIS — E669 Obesity, unspecified: Secondary | ICD-10-CM

## 2015-08-25 NOTE — Patient Instructions (Addendum)
-   Check the dose of vitamin A you are using, and email [email or web address].   - Ask your ophthalmologist for her suggestion for a dietary supplement for eye health.   - If you are sleep-deprived, losing weight will be MUCH harder for you.  You may want to see Sleep Medicine specialist Dr. Carlena Sax.   - Cereal:  Look for ones that have at least 5 grams of fiber per serving.  - TASTE PREFERENCES ARE LEARNED.  This means that it will get easier to choose foods you know are good for you if you are exposed to them enough.    Goals: 1. Eat at least 3 REAL meals and 1-2 snacks per day.  Aim for no more than 5 hours between eating.  Eat breakfast within one hour of getting up.   - A REAL meal includes protein, starch, and vegetables and/or fruit.  (Some of the best fruits - low in sugar and calories, and high in fiber - are berries, peaches, plums, pears, apples, cantaloupe).  2. At least 48 oz water per day.  Reduce intake of juice; try using a smaller glass; mix half and half with seltzer.  Aim for no more than 8 oz juice per day for next 4 weeks; then decrease to 4 oz per day.  3. Physical activity:  At least 20 min 3 times a week for the next 4 weeks.  Check into downloading some podcasts onto your smartphone to help with this.    Bring your Goals Sheet to follow-up.

## 2015-08-25 NOTE — Progress Notes (Signed)
Medical Nutrition Therapy:  Appt start time: 1430 end time:  [id number]. PCP:  [name], MD; Sadie Haber at [place]:  Marijean Niemann  Assessment:  Primary concerns today: Weight management and cardiac health.    Ms. Ridens would like to lose weight, and was referred by Dr. [name] for wt mgmt and heart healthy diet.  She has struggled with wt loss, despite making strong efforts at times.  Ms. Kirschbaum has a pacemaker, following complete heart block a little more than a yr ago.  Also has hyperlipidemia, hypertension, and osteoporosis.    Ms. Laidler has been taking vit A supplement for years; not sure of dosage.  She takes it for eye health, but I asked that they send me the dosage info; excessive vitamin A can raise risk of osteoporosis.    Also concerning for Ms. Gabrielsen's weight (and BP) management is her insomnia.    Learning Readiness: Ready  Usual eating pattern includes 2 meals and 1-2 snacks per day. Frequent foods and beverages include 30 oz fruit juices/d, tea, seltzer/ginger ale, veg's, brn rice, beans, nuts, bananas, grapes, berries, chs.  Avoided foods include most desserts, beef, most fish, collards, grn beans.   Usual physical activity includes none currently.  24-hr recall: (Up at 6 AM; bk to bed at 10 AM) B ( AM)-   --- Snk ( AM)-   --- L (12 PM)-  1 c cornflakes w/ almonds, 1/2 c soymilk, 2 slc toast, lemon tea, 1 tsp sugar, 10 oz o.j.  Snk (4 PM)-  1 banana, 10 oz cranberry jc D (6:30 PM)-  1 c spaghetti, primavera sauce, 2 pcs garlic toast, 10 oz juice Snk (10:30)-  1 grpfruit Typical day? Yes.    Progress Towards Goal(s):  In progress.   Nutritional Diagnosis:  [place]-3.3 Overweight/obesity As related to energy imbalance.  As evidenced by BMI >30.    Intervention:  Nutrition education.  Handouts given during visit include:  AVS  Goals Sheet  Demonstrated degree of understanding via:  Teach Back  Barriers to learning/adherence to lifestyle change: Long h/o being  sedentary; also making wt loss more difficult is poor sleep.  Monitoring/Evaluation:  Dietary intake, exercise, and body weight in 4 week(s).

## 2015-09-22 ENCOUNTER — Ambulatory Visit (INDEPENDENT_AMBULATORY_CARE_PROVIDER_SITE_OTHER): Payer: Commercial Managed Care - HMO | Admitting: Family Medicine

## 2015-09-22 ENCOUNTER — Encounter: Payer: Self-pay | Admitting: Family Medicine

## 2015-09-22 VITALS — Ht 64.0 in | Wt 195.4 lb

## 2015-09-22 DIAGNOSIS — E785 Hyperlipidemia, unspecified: Secondary | ICD-10-CM | POA: Diagnosis not present

## 2015-09-22 DIAGNOSIS — E669 Obesity, unspecified: Secondary | ICD-10-CM | POA: Diagnosis not present

## 2015-09-22 DIAGNOSIS — I10 Essential (primary) hypertension: Secondary | ICD-10-CM | POA: Diagnosis not present

## 2015-09-22 DIAGNOSIS — Z713 Dietary counseling and surveillance: Secondary | ICD-10-CM

## 2015-09-22 NOTE — Progress Notes (Signed)
Medical Nutrition Therapy:  Appt start time: 1430 end time:  [id number]. PCP:  [name], MD; Sadie Haber at [place]:  Marijean Niemann  Assessment:  Primary concerns today: Weight management and cardiac health.   Ms. Hilde found that eating 3 meals, which she did on 4 days, was too much; she felt too full.  On most days, she gets up very early, prays for a period of time, then goes back to bed until late morning.  This is b/c she seldom sleeps well at night, although she did say sleep has improved slightly, speculating that exercise has been helpful.  Ilena has been walking ~20 min on the TM 1-3 X wk; started 25 min on TM this week.  She has reduced juice intake to 8 oz per day, and has been getting 32 oz of water most days, with 48 oz sometimes.  Has been eating only cereals with at least 6 g fiber per serving.    24-hr recall:  (Up at 5 AM) B (11 AM)-  Lemon water, 1 c Shr Wht n Bran, 1 banana, 1/2 c blueberries, 1 c almond milk Snk ( AM)-  --- L ( PM)-  --- Snk ( PM)-  --- D (5:30 PM)-  4 oz sauteed chx, bell pep, onion, 1 c brn rice, 1c  Lentils, 1 c blndr drink: apple jc, cranber jc w/ celery, strwber,  Snk ( PM)-  5 oz Grk blueberry yogurt, 1 grapefruit Typical day? Yes.     Progress Towards Goal(s):  In progress.   Nutritional Diagnosis:  [place]-3.3 Overweight/obesity As related to energy imbalance.  As evidenced by BMI >30.    Intervention:  Nutrition education.  Handouts given during visit include:  AVS  Goals Sheet  Demonstrated degree of understanding via:  Teach Back  Barriers to learning/adherence to lifestyle change: Long h/o being sedentary; also making wt loss more difficult is poor sleep.  Monitoring/Evaluation:  Dietary intake, exercise, and body weight in 4 week(s).

## 2015-09-22 NOTE — Patient Instructions (Addendum)
-   Reminder:  Ask your ophthalmologist about the vitamin A supplement.  The concern about high dosage is that too much vitamin A is bad for your bones.    - Ask her if she has a recommendation for a supplement that provides Beta-carotene in lieu of vitamin A.   Goals: 1. Eat at least 3 REAL meals and 1-2 snacks per day.  Aim for no more than 5 hours between eating.  Eat breakfast within one hour of getting up.   - Try having a smaller breakfast than you had before, i.e., 1 egg, 4 oz o.j.  - For lunch, try a half-turkey sandwich with carrots.   2. At least 48 oz water per day.  Continue to mix juice half and half with seltzer: No more than 4 oz juice per day.    - Drink some water (at least 12 oz) first thing in the morning.   3. Physical activity:  At least 25 min 4 times a week for the next 4 weeks.

## 2015-10-27 ENCOUNTER — Ambulatory Visit (INDEPENDENT_AMBULATORY_CARE_PROVIDER_SITE_OTHER): Payer: Commercial Managed Care - HMO | Admitting: *Deleted

## 2015-10-27 DIAGNOSIS — I442 Atrioventricular block, complete: Secondary | ICD-10-CM

## 2015-10-27 NOTE — Progress Notes (Signed)
Remote pacemaker transmission.   

## 2015-11-04 DIAGNOSIS — Z1211 Encounter for screening for malignant neoplasm of colon: Secondary | ICD-10-CM | POA: Diagnosis not present

## 2015-11-10 ENCOUNTER — Ambulatory Visit: Payer: Commercial Managed Care - HMO | Admitting: Family Medicine

## 2015-11-17 DIAGNOSIS — Z0000 Encounter for general adult medical examination without abnormal findings: Secondary | ICD-10-CM | POA: Diagnosis not present

## 2015-11-17 DIAGNOSIS — Z950 Presence of cardiac pacemaker: Secondary | ICD-10-CM | POA: Diagnosis not present

## 2015-11-17 DIAGNOSIS — I459 Conduction disorder, unspecified: Secondary | ICD-10-CM | POA: Diagnosis not present

## 2015-11-17 DIAGNOSIS — L309 Dermatitis, unspecified: Secondary | ICD-10-CM | POA: Diagnosis not present

## 2015-11-17 DIAGNOSIS — E785 Hyperlipidemia, unspecified: Secondary | ICD-10-CM | POA: Diagnosis not present

## 2015-11-17 DIAGNOSIS — E669 Obesity, unspecified: Secondary | ICD-10-CM | POA: Diagnosis not present

## 2015-11-17 DIAGNOSIS — I10 Essential (primary) hypertension: Secondary | ICD-10-CM | POA: Diagnosis not present

## 2015-11-17 DIAGNOSIS — Z6835 Body mass index (BMI) 35.0-35.9, adult: Secondary | ICD-10-CM | POA: Diagnosis not present

## 2015-12-04 ENCOUNTER — Encounter: Payer: Self-pay | Admitting: Cardiology

## 2015-12-04 LAB — CUP PACEART REMOTE DEVICE CHECK
Battery Voltage: 3.01 V
Brady Statistic AP VP Percent: 41 %
Brady Statistic AS VP Percent: 59 %
Brady Statistic AS VS Percent: 1 %
Date Time Interrogation Session: 20170418080013
Implantable Lead Implant Date: 20160114
Implantable Lead Location: 753859
Implantable Lead Location: 753860
Lead Channel Impedance Value: 740 Ohm
Lead Channel Pacing Threshold Amplitude: 0.75 V
Lead Channel Pacing Threshold Pulse Width: 0.4 ms
Lead Channel Setting Pacing Pulse Width: 0.4 ms
Lead Channel Setting Sensing Sensitivity: 4 mV
MDC IDC LEAD IMPLANT DT: 20160114
MDC IDC MSMT BATTERY REMAINING LONGEVITY: 132 mo
MDC IDC MSMT BATTERY REMAINING PERCENTAGE: 95.5 %
MDC IDC MSMT LEADCHNL RA IMPEDANCE VALUE: 490 Ohm
MDC IDC MSMT LEADCHNL RA PACING THRESHOLD AMPLITUDE: 0.5 V
MDC IDC MSMT LEADCHNL RA PACING THRESHOLD PULSEWIDTH: 0.4 ms
MDC IDC MSMT LEADCHNL RA SENSING INTR AMPL: 5 mV
MDC IDC SET LEADCHNL RA PACING AMPLITUDE: 1.5 V
MDC IDC SET LEADCHNL RV PACING AMPLITUDE: 1 V
MDC IDC STAT BRADY AP VS PERCENT: 1 %
MDC IDC STAT BRADY RA PERCENT PACED: 41 %
MDC IDC STAT BRADY RV PERCENT PACED: 99 %
Pulse Gen Model: 2240
Pulse Gen Serial Number: 7706772

## 2015-12-09 DIAGNOSIS — D122 Benign neoplasm of ascending colon: Secondary | ICD-10-CM | POA: Diagnosis not present

## 2015-12-09 DIAGNOSIS — D126 Benign neoplasm of colon, unspecified: Secondary | ICD-10-CM | POA: Diagnosis not present

## 2015-12-09 DIAGNOSIS — D123 Benign neoplasm of transverse colon: Secondary | ICD-10-CM | POA: Diagnosis not present

## 2015-12-09 DIAGNOSIS — Z1211 Encounter for screening for malignant neoplasm of colon: Secondary | ICD-10-CM | POA: Diagnosis not present

## 2015-12-09 DIAGNOSIS — D490 Neoplasm of unspecified behavior of digestive system: Secondary | ICD-10-CM | POA: Diagnosis not present

## 2015-12-09 DIAGNOSIS — K5730 Diverticulosis of large intestine without perforation or abscess without bleeding: Secondary | ICD-10-CM | POA: Diagnosis not present

## 2015-12-15 DIAGNOSIS — Z780 Asymptomatic menopausal state: Secondary | ICD-10-CM | POA: Diagnosis not present

## 2015-12-15 DIAGNOSIS — M8589 Other specified disorders of bone density and structure, multiple sites: Secondary | ICD-10-CM | POA: Diagnosis not present

## 2016-01-04 ENCOUNTER — Ambulatory Visit: Payer: Self-pay | Admitting: Surgery

## 2016-01-04 DIAGNOSIS — Z01818 Encounter for other preprocedural examination: Secondary | ICD-10-CM | POA: Diagnosis not present

## 2016-01-04 DIAGNOSIS — K642 Third degree hemorrhoids: Secondary | ICD-10-CM | POA: Diagnosis not present

## 2016-01-04 DIAGNOSIS — K643 Fourth degree hemorrhoids: Secondary | ICD-10-CM | POA: Diagnosis not present

## 2016-01-04 NOTE — H&P (Signed)
Elaine Ford 01/04/2016 3:15 PM Location: [facility] Patient #: [id number] DOB: 1946/06/25 Married / Language: Elaine Ford / Race: White Female  Patient Care Team: [name], Ford as PCP - General (Family Medicine) [name], RD as Dietitian (Family Medicine) [name], Ford as Consulting Physician (Cardiology) [name], Ford as Consulting Physician (General Surgery) [name], Ford as Consulting Physician (Gastroenterology)   History of Present Illness Elaine Ford; 01/04/2016 5:18 PM) The patient is a 70 year old female who presents with anal lesions. Note for "Anal lesions": Patient sent for surgical consultation by her gastroenterologist, Dr. [name] with [facility] gastroenterology. Concern for renal mass.  Pleasant elderly woman. Originally from [place]. Here today with her husband. Colonoscopy this year. Multiple tubular adenomas removed. Prolapse anal canal mass noted. Surgical consultation requested. Patient and she gets some bleeding in anal pain and discomfort, especially with sitting. Occasionally feels a lump when she wipes. Usually moves her bowels twice a day. Had an open cholecystectomy decades ago but no other surgery. Can walk a half hour without difficulty. She does have a St Jude pacemaker for complete heart block followed by Dr. Lovena Le with the [facility]. she does have chronic soreness at the pacemaker site and skin sensitivity. Note she has a small lump along her right subcostal incision. Occasionally has drained but has not done for decades. Does not hurt. No fall or trauma.  No personal nor family history of GI/colon cancer, inflammatory bowel disease, irritable bowel syndrome, allergy such as Celiac Sprue, dietary/dairy problems, colitis, ulcers nor gastritis. No recent sick contacts/gastroenteritis. No travel outside the country. No changes in diet. No dysphagia to solids or liquids. No significant heartburn or  reflux. No hematochezia, hematemesis, coffee ground emesis. No evidence of prior gastric/peptic ulceration.   Other Problems Elaine Ford, CMA; 01/04/2016 3:15 PM) Back Pain Cerebrovascular Accident Cholelithiasis Congestive Heart Failure High blood pressure  Past Surgical History Elaine Ford, CMA; 01/04/2016 3:15 PM) Colon Polyp Removal - Colonoscopy Gallbladder Surgery - Open  Diagnostic Studies History Elaine Ford, CMA; 01/04/2016 3:15 PM) Colonoscopy within last year Mammogram within last year Pap Smear 1-5 years ago  Allergies Elaine Ford, CMA; 01/04/2016 3:16 PM) Codeine Sulfate *ANALGESICS - OPIOID* Hydrocodone-Acetaminophen *ANALGESICS - OPIOID* Lisinopril *CHEMICALS* Losartan Potassium-HCTZ *ANTIHYPERTENSIVES* OxyCODONE HCl *ANALGESICS - OPIOID*  Medication History Elaine Ford, CMA; 01/04/2016 3:16 PM) Carvedilol (25MG  Tablet, Oral) Active. Medications Reconciled  Family History Elaine Ford, [place]; 01/04/2016 3:15 PM) Heart Disease Brother, Father. Hypertension Brother, Mother, Son.  Pregnancy / Birth History Elaine Ford, CMA; 01/04/2016 3:15 PM) Age at menarche 62 years. Age of menopause 53-60 Gravida 74 Maternal age 68-25 Para 2 Regular periods     Review of Systems Elaine Ford CMA; 01/04/2016 3:15 PM) General Not Present- Appetite Loss, Chills, Fatigue, Fever, Night Sweats, Weight Gain and Weight Loss. Skin Present- Non-Healing Wounds. Not Present- Change in Wart/Mole, Dryness, Hives, Jaundice, New Lesions, Rash and Ulcer. HEENT Present- Wears glasses/contact lenses. Not Present- Earache, Hearing Loss, Hoarseness, Nose Bleed, Oral Ulcers, Ringing in the Ears, Seasonal Allergies, Sinus Pain, Sore Throat, Visual Disturbances and Yellow Eyes. Respiratory Present- Snoring. Not Present- Bloody sputum, Chronic Cough, Difficulty Breathing and Wheezing. Breast Not Present- Breast Mass, Breast Pain, Nipple Discharge and Skin  Changes. Cardiovascular Present- Swelling of Extremities. Not Present- Chest Pain, Difficulty Breathing Lying Down, Leg Cramps, Palpitations, Rapid Heart Rate and Shortness of Breath. Gastrointestinal Present- Abdominal Pain and Rectal Pain. Not Present- Bloating, Bloody Stool, Change in  Bowel Habits, Chronic diarrhea, Constipation, Difficulty Swallowing, Excessive gas, Gets full quickly at meals, Hemorrhoids, Indigestion, Nausea and Vomiting. Female Genitourinary Present- Frequency and Nocturia. Not Present- Painful Urination, Pelvic Pain and Urgency. Musculoskeletal Present- Back Pain. Not Present- Joint Pain, Joint Stiffness, Muscle Pain, Muscle Weakness and Swelling of Extremities. Neurological Present- Numbness. Not Present- Decreased Memory, Fainting, Headaches, Seizures, Tingling, Tremor, Trouble walking and Weakness. Psychiatric Not Present- Anxiety, Bipolar, Change in Sleep Pattern, Depression, Fearful and Frequent crying. Endocrine Not Present- Cold Intolerance, Excessive Hunger, Hair Changes, Heat Intolerance, Hot flashes and New Diabetes. Hematology Present- Easy Bruising. Not Present- Excessive bleeding, Gland problems, HIV and Persistent Infections.  Vitals Elaine Ford CMA; 01/04/2016 3:17 PM) 01/04/2016 3:16 PM Weight: 190 lb Height: 64in Body Surface Area: 1.91 m Body Mass Index: 32.61 kg/m  Temp.: 97.46F(Temporal)  Pulse: 68 (Regular)  BP: 158/92 (Sitting, Left Arm, Standard)      Physical Exam Elaine Ford; 01/04/2016 3:43 PM)  General Mental Status-Alert. General Appearance-Not in acute distress, Not Sickly. Orientation-Oriented X3. Hydration-Well hydrated. Voice-Normal.  Integumentary Global Assessment Upon inspection and palpation of skin surfaces of the - Axillae: non-tender, no inflammation or ulceration, no drainage. and Distribution of scalp and body hair is normal. General Characteristics Temperature - normal warmth is  noted.  Head and Neck Head-normocephalic, atraumatic with no lesions or palpable masses. Face Global Assessment - atraumatic, no absence of expression. Neck Global Assessment - no abnormal movements, no bruit auscultated on the right, no bruit auscultated on the left, no decreased range of motion, non-tender. Trachea-midline. Thyroid Gland Characteristics - non-tender.  Eye Eyeball - Left-Extraocular movements intact, No Nystagmus. Eyeball - Right-Extraocular movements intact, No Nystagmus. Cornea - Left-No Hazy. Cornea - Right-No Hazy. Sclera/Conjunctiva - Left-No scleral icterus, No Discharge. Sclera/Conjunctiva - Right-No scleral icterus, No Discharge. Pupil - Left-Direct reaction to light normal. Pupil - Right-Direct reaction to light normal.  ENMT Ears Pinna - Left - no drainage observed, no generalized tenderness observed. Right - no drainage observed, no generalized tenderness observed. Nose and Sinuses External Inspection of the Nose - no destructive lesion observed. Inspection of the nares - Left - quiet respiration. Right - quiet respiration. Mouth and Throat Lips - Upper Lip - no fissures observed, no pallor noted. Lower Lip - no fissures observed, no pallor noted. Nasopharynx - no discharge present. Oral Cavity/Oropharynx - Tongue - no dryness observed. Oral Mucosa - no cyanosis observed. Hypopharynx - no evidence of airway distress observed.  Chest and Lung Exam Inspection Movements - Normal and Symmetrical. Accessory muscles - No use of accessory muscles in breathing. Palpation Palpation of the chest reveals - Non-tender. Auscultation Breath sounds - Normal and Clear. Note: Left infraclavicular pacemaker on anterior chest wall. No infection but sensitive to light and deep touch. No cellulitis.  Cardiovascular Auscultation Rhythm - Regular. Murmurs & Other Heart Sounds - Auscultation of the heart reveals - No Murmurs and No Systolic  Clicks.  Abdomen Inspection Inspection of the abdomen reveals - No Visible peristalsis and No Abnormal pulsations. Umbilicus - No Bleeding, No Urine drainage. Palpation/Percussion Palpation and Percussion of the abdomen reveal - Soft, Non Tender, No Rebound tenderness, No Rigidity (guarding) and No Cutaneous hyperesthesia. Note: Obese but soft. Right subcostal incision. 3 x 3 cm subdermal subcutaneous mass consistent with sebaceous cyst. Soft. Not painful. Not inflamed.  Female Genitourinary Sexual Maturity Tanner 5 - Adult hair pattern. Note: No vaginal bleeding nor discharge  Rectal Note: Chronically Prolapsed left lateral hemorrhoid = Grade 4.  Inflamed right anterior internal hemorrhoid grade 3 pile. Right posterior more Gr2.    Exam done with assistance of female Medical Assistant in the room. Perianal skin clean with good hygiene. mild pruritis ani. No pilonidal disease. No fissure. No abscess/fistula. Normal sphincter tone. Tolerates digital and anoscopic rectal exam. No other rectal masses.  Peripheral Vascular Upper Extremity Inspection - Left - No Cyanotic nailbeds, Not Ischemic. Right - No Cyanotic nailbeds, Not Ischemic.  Neurologic Neurologic evaluation reveals -normal attention span and ability to concentrate, able to name objects and repeat phrases. Appropriate fund of knowledge , normal sensation and normal coordination. Mental Status Affect - not angry, not paranoid. Cranial Nerves-Normal Bilaterally. Gait-Normal.  Neuropsychiatric Mental status exam performed with findings of-able to articulate well with normal speech/language, rate, volume and coherence, thought content normal with ability to perform basic computations and apply abstract reasoning and no evidence of hallucinations, delusions, obsessions or homicidal/suicidal ideation.  Musculoskeletal Global Assessment Spine, Ribs and Pelvis - no instability, subluxation or laxity. Right Upper  Extremity - no instability, subluxation or laxity.  Lymphatic Head & Neck  General Head & Neck Lymphatics: Bilateral - Description - No Localized lymphadenopathy. Axillary  General Axillary Region: Bilateral - Description - No Localized lymphadenopathy. Femoral & Inguinal  Generalized Femoral & Inguinal Lymphatics: Left - Description - No Localized lymphadenopathy. Right - Description - No Localized lymphadenopathy.   Results Elaine Ford; 01/04/2016 5:21 PM) Procedures  Name Value Date Hemorrhoids Procedure Anal exam: prolapse Internal exam: Internal Hemorroids ( non-bleeding) prolapse Other: Please see rectal examination section of physical exam. Left lateral chronically prolapsed grade 2 internal hemorrhoid. Right anterior grade 3. Right posterior grade 2.  Performed: 01/04/2016 3:44 PM    Assessment & Plan Elaine Ford; 01/04/2016 4:01 PM)  PROLAPSED INTERNAL HEMORRHOIDS, GRADE 4 (K64.3) Impression: Chronically prolapsed painful and occasionally bleeding hemorrhoid. Requires removal. I would do internal hemorrhoidal ligation of remaining piles as well. Probably do not need to [facility] system, noticed absorbable 2-0 Vicryl suture. Surgery. She definitely wants anesthesia to be comfortable.  Would like cardiac clearance from Dr. Lovena Le. Has a pacemaker for complete heart block. Most likely won't be much of an issue. Make sure there are no special interventions aside from placing a magnet over the [facility]. Jude 1688 TC 52-cm lead) pacemaker during the time of surgery.  Current Plans ANOSCOPY, DIAGNOSTIC [id number]) Pt Education - [facility] Hemorrhoids (Salisa Broz): discussed with patient and provided information. Pt Education - Pamphlet Given - The Hemorrhoid Book: discussed with patient and provided information. PROLAPSED INTERNAL HEMORRHOIDS, GRADE 3 (K64.2)  ENCOUNTER FOR PREOPERATIVE EXAMINATION FOR GENERAL SURGICAL PROCEDURE (Z01.818)  Current Plans You are  being scheduled for surgery - Our schedulers will call you.  You should hear from our office's scheduling department within 5 working days about the location, date, and time of surgery. We try to make accommodations for patient's preferences in scheduling surgery, but sometimes the OR schedule or the surgeon's schedule prevents [place] from making those accommodations.  If you have not heard from our office [phone]) in 5 working days, call the office and ask for your surgeon's nurse.  If you have other questions about your diagnosis, plan, or surgery, call the office and ask for your surgeon's nurse.  Pt Education - CCS Rectal Prep for Anorectal outpatient/office surgery: discussed with patient and provided information. Pt Education - [facility] Rectal Surgery HCI (Jalaya Sarver): discussed with patient and provided information. Pt Education - CCS Pelvic Floor Exercises (Kegels) and  Dysfunction HCI (Dyanna Seiter) I recommended obtaining preoperative cardiac clearance make sure no special interventions for the pacemaker. I am concerned about the health of the patient and the ability to tolerate the operation. Therefore, we will request clearance by cardiology to better assess operative risk & see if a reevaluation, further workup, etc is needed. Also recommendations on how medications such as for anticoagulation and blood pressure should be managed/held/restarted after surgery.  Elaine Hector, M.D., F.A.C.S. Gastrointestinal and Minimally Invasive Surgery [facility] [address], [place], [place] [address] [phone] Main / Paging

## 2016-01-05 ENCOUNTER — Ambulatory Visit: Payer: Self-pay | Admitting: Surgery

## 2016-01-06 ENCOUNTER — Other Ambulatory Visit: Payer: Self-pay | Admitting: Family Medicine

## 2016-01-06 ENCOUNTER — Other Ambulatory Visit (HOSPITAL_COMMUNITY)
Admission: RE | Admit: 2016-01-06 | Discharge: 2016-01-06 | Disposition: A | Discharge status: Home / Self Care | Payer: Commercial Managed Care - HMO | Source: Ambulatory Visit | Attending: Family Medicine | Admitting: Family Medicine

## 2016-01-06 DIAGNOSIS — R102 Pelvic and perineal pain: Secondary | ICD-10-CM

## 2016-01-06 DIAGNOSIS — N950 Postmenopausal bleeding: Secondary | ICD-10-CM

## 2016-01-06 DIAGNOSIS — Z01411 Encounter for gynecological examination (general) (routine) with abnormal findings: Secondary | ICD-10-CM | POA: Diagnosis not present

## 2016-01-08 ENCOUNTER — Ambulatory Visit
Admission: RE | Admit: 2016-01-08 | Discharge: 2016-01-08 | Disposition: A | Discharge status: Home / Self Care | Payer: Commercial Managed Care - HMO | Source: Ambulatory Visit | Attending: Family Medicine | Admitting: Family Medicine

## 2016-01-08 DIAGNOSIS — R102 Pelvic and perineal pain: Secondary | ICD-10-CM

## 2016-01-08 DIAGNOSIS — N950 Postmenopausal bleeding: Secondary | ICD-10-CM

## 2016-01-08 DIAGNOSIS — N858 Other specified noninflammatory disorders of uterus: Secondary | ICD-10-CM | POA: Diagnosis not present

## 2016-01-13 LAB — CYTOLOGY - PAP

## 2016-01-14 ENCOUNTER — Other Ambulatory Visit: Payer: Self-pay | Admitting: Obstetrics & Gynecology

## 2016-01-14 DIAGNOSIS — N8501 Benign endometrial hyperplasia: Secondary | ICD-10-CM | POA: Diagnosis not present

## 2016-01-14 DIAGNOSIS — N7689 Other specified inflammation of vagina and vulva: Secondary | ICD-10-CM | POA: Diagnosis not present

## 2016-01-14 DIAGNOSIS — N840 Polyp of corpus uteri: Secondary | ICD-10-CM | POA: Diagnosis not present

## 2016-01-14 DIAGNOSIS — N950 Postmenopausal bleeding: Secondary | ICD-10-CM | POA: Diagnosis not present

## 2016-01-26 ENCOUNTER — Ambulatory Visit (INDEPENDENT_AMBULATORY_CARE_PROVIDER_SITE_OTHER): Payer: Commercial Managed Care - HMO | Admitting: *Deleted

## 2016-01-26 ENCOUNTER — Telehealth: Payer: Self-pay | Admitting: Cardiology

## 2016-01-26 DIAGNOSIS — I442 Atrioventricular block, complete: Secondary | ICD-10-CM | POA: Diagnosis not present

## 2016-01-26 NOTE — Telephone Encounter (Signed)
LMOVM reminding pt to send remote transmission.   

## 2016-01-27 NOTE — Progress Notes (Signed)
Remote pacemaker transmission.   

## 2016-01-28 ENCOUNTER — Encounter: Payer: Self-pay | Admitting: Cardiology

## 2016-01-29 LAB — CUP PACEART REMOTE DEVICE CHECK
Battery Remaining Longevity: 132 mo
Brady Statistic AP VP Percent: 42 %
Brady Statistic AP VS Percent: 1 %
Brady Statistic AS VS Percent: 1 %
Brady Statistic RV Percent Paced: 99 %
Date Time Interrogation Session: 20170719085522
Implantable Lead Implant Date: 20160114
Implantable Lead Location: 753859
Lead Channel Impedance Value: 450 Ohm
Lead Channel Pacing Threshold Amplitude: 0.75 V
Lead Channel Pacing Threshold Pulse Width: 0.4 ms
Lead Channel Sensing Intrinsic Amplitude: 4.6 mV
Lead Channel Setting Pacing Amplitude: 1 V
Lead Channel Setting Pacing Pulse Width: 0.4 ms
Lead Channel Setting Sensing Sensitivity: 4 mV
MDC IDC LEAD IMPLANT DT: 20160114
MDC IDC LEAD LOCATION: 753860
MDC IDC MSMT BATTERY REMAINING PERCENTAGE: 95.5 %
MDC IDC MSMT BATTERY VOLTAGE: 3.01 V
MDC IDC MSMT LEADCHNL RA PACING THRESHOLD AMPLITUDE: 0.375 V
MDC IDC MSMT LEADCHNL RA PACING THRESHOLD PULSEWIDTH: 0.4 ms
MDC IDC MSMT LEADCHNL RV IMPEDANCE VALUE: 680 Ohm
MDC IDC MSMT LEADCHNL RV SENSING INTR AMPL: 12 mV
MDC IDC SET LEADCHNL RA PACING AMPLITUDE: 1.375
MDC IDC STAT BRADY AS VP PERCENT: 58 %
MDC IDC STAT BRADY RA PERCENT PACED: 42 %
Pulse Gen Model: 2240
Pulse Gen Serial Number: 7706772

## 2016-02-23 ENCOUNTER — Encounter (HOSPITAL_COMMUNITY): Payer: Self-pay

## 2016-02-23 ENCOUNTER — Encounter (HOSPITAL_COMMUNITY)
Admission: RE | Admit: 2016-02-23 | Discharge: 2016-02-23 | Disposition: A | Discharge status: Home / Self Care | Payer: Commercial Managed Care - HMO | Source: Ambulatory Visit | Attending: Obstetrics & Gynecology | Admitting: Obstetrics & Gynecology

## 2016-02-23 ENCOUNTER — Other Ambulatory Visit: Payer: Self-pay

## 2016-02-23 DIAGNOSIS — Z01818 Encounter for other preprocedural examination: Secondary | ICD-10-CM | POA: Insufficient documentation

## 2016-02-23 HISTORY — DX: Cerebral infarction, unspecified: I63.9

## 2016-02-23 HISTORY — DX: Gestational diabetes mellitus in pregnancy, unspecified control: O24.419

## 2016-02-23 HISTORY — DX: Dorsalgia, unspecified: M54.9

## 2016-02-23 HISTORY — DX: Hypotension, unspecified: I95.9

## 2016-02-23 HISTORY — DX: Unspecified fracture of shaft of humerus, unspecified arm, initial encounter for closed fracture: S42.309A

## 2016-02-23 HISTORY — DX: Other injury of unspecified body region, initial encounter: T14.8XXA

## 2016-02-23 HISTORY — DX: Pain, unspecified: R52

## 2016-02-23 LAB — CBC
HCT: 42.3 % (ref 36.0–46.0)
HEMOGLOBIN: 14.5 g/dL (ref 12.0–15.0)
MCH: 28 pg (ref 26.0–34.0)
MCHC: 34.3 g/dL (ref 30.0–36.0)
MCV: 81.8 fL (ref 78.0–100.0)
PLATELETS: 280 10*3/uL (ref 150–400)
RBC: 5.17 MIL/uL — AB (ref 3.87–5.11)
RDW: 14.4 % (ref 11.5–15.5)
WBC: 8.7 10*3/uL (ref 4.0–10.5)

## 2016-02-23 LAB — BASIC METABOLIC PANEL
ANION GAP: 7 (ref 5–15)
BUN: 12 mg/dL (ref 6–20)
CALCIUM: 8.9 mg/dL (ref 8.9–10.3)
CO2: 26 mmol/L (ref 22–32)
CREATININE: 0.87 mg/dL (ref 0.44–1.00)
Chloride: 103 mmol/L (ref 101–111)
Glucose, Bld: 94 mg/dL (ref 65–99)
Potassium: 4.8 mmol/L (ref 3.5–5.1)
Sodium: 136 mmol/L (ref 135–145)

## 2016-02-23 NOTE — Patient Instructions (Signed)
Your procedure is scheduled on:  Monday, February 29, 2016  Enter through the Main Entrance of [facility] at:  11:00 am  Pick up the phone at the desk and dial [phone].  Call this number if you have problems the morning of surgery: [phone].  Remember: Do NOT eat food:  After Midnight Sunday  Do NOT drink clear liquids after:  8:30 AM day of surgery  Take these medicines the morning of surgery with a SIP OF WATER:  Carvedilol  Stop taking Vitamin E and Naproxen  Do NOT wear jewelry (body piercing), metal hair clips/bobby pins, make-up, or nail polish. Do NOT wear lotions, powders, or perfumes.  You may wear deodorant. Do NOT shave for 48 hours prior to surgery. Do NOT bring valuables to the hospital. Contacts, dentures, or bridgework may not be worn into surgery.  Have a responsible adult drive you home and stay with you for 24 hours after your procedure

## 2016-02-23 NOTE — Pre-Procedure Instructions (Signed)
Dr. Jillyn Hidden reviewed Elaine Ford's EKG and medical history.  No further information requested for the surgery.  Dr. Jillyn Hidden cleared Elaine Ford for surgery February 29, 2016

## 2016-02-24 DIAGNOSIS — N950 Postmenopausal bleeding: Secondary | ICD-10-CM | POA: Diagnosis not present

## 2016-02-24 DIAGNOSIS — R102 Pelvic and perineal pain: Secondary | ICD-10-CM | POA: Diagnosis not present

## 2016-02-24 DIAGNOSIS — Z01818 Encounter for other preprocedural examination: Secondary | ICD-10-CM | POA: Diagnosis not present

## 2016-02-24 NOTE — Pre-Procedure Instructions (Signed)
I notified Elaine Ford and Dr. Neoma Laming of [name]'s day of surgery, patient's pacemaker and that orders where on the patient's chart from cardiologist.

## 2016-02-28 MED ORDER — METRONIDAZOLE IN NACL 5-0.79 MG/ML-% IV SOLN
500.0000 mg | INTRAVENOUS | Status: AC
Start: 2016-02-29 — End: 2016-02-29
  Administered 2016-02-29: 500 mg via INTRAVENOUS
  Filled 2016-02-28: qty 100

## 2016-02-28 MED ORDER — GABAPENTIN 300 MG PO CAPS
300.0000 mg | ORAL_CAPSULE | ORAL | Status: AC
  Administered 2016-02-29: 300 mg via ORAL

## 2016-02-28 MED ORDER — CEFAZOLIN SODIUM-DEXTROSE 2-4 GM/100ML-% IV SOLN: 2.0000 g | INTRAVENOUS | Status: DC

## 2016-02-28 NOTE — H&P (Signed)
70yo PM female who presents for [facility], D&C, possible myosure polypectomy due to simple and complex hyperplasia with worsening bleeding. She has been taking progesterone, which initially improved her bleeding; however, the bleeding returned. She reports old dark blood that requires about 8-10 pads per day. She also notes significant bilaterally pelvic discomfort. She takes tylenol and Naproxen which does help, but without the NSAID (due to upcoming surgery) she is miserable. Pain is intermittent and describes it as significant cramping. Denies nausea/vomiting. No fever or chills. No other acute complaints. She does reports occasional SUI, denies urgency or nocturia. Denies vaginal discharge, itching or irritation.  Work up has included the following: US performed on 6/30: 5.6x6.9x12.4cm uterus with 81mm endometrial lining and focal echogenic mass measuring about 2cm surrounded by fluid suggestive of polyp. Normal ovaries bilaterally.  EMB 01/14/16: Simple and complex hyperplasia no atypia.   Current Medications  Taking   Carvedilol 25 MG Tablet 1 tablet Orally twice a day   Not-Taking/PRN   Tylenol(APAP) 325 MG Tablet 2 tablets as needed Orally   Triamcinolone Acetonide 0.1 % Ointment 1 application to affected area Externally Daily   Discontinued   MedroxyPROGESTERone Acetate 10 MG Tablet 1 tablet with food Orally Once a day   Medication List reviewed and reconciled with the patient      Past Medical History:  Diagnosis Date  . Back pain   . Broken arm    age 70 years old brokee right arm after a fall  . Gall bladder stones   . Gestational diabetes   . Hemorrhoid   . Hx of cardiac cath    [facility] 07/24/14 at [facility] - no significant CAD  . Hx of cardiovascular stress test    Myoview 11/14:  No ischemia  . Hx of echocardiogram    Echo 07/23/14 - at [facility]:  EF 65-70%, impaired relaxation, mild LAE, mild MR, mild AI, mild elevated PASP (44.2 mmHg)  . Hyperlipidemia   . Hypertension   . LBBB (left  bundle branch block)   . Low blood pressure   . Nerve damage    right arm  . Pacemaker-St Jude 07/26/2014  . Pain    Left upper chest s/p pacer maker insertion site 07/2014  . S/P cardiac pacemaker procedure 07/2014   St. Jude - Dr. Lovena Le  . Stroke [facility])    patient was unaware noted prior to pacermaker placement unknown when it occurred  . Wound of skin    old gallbladder surgical site, raised area on skin, wound drainage periodically    Past Surgical History:  Procedure Laterality Date  . COLONOSCOPY    . GALLBLADDER SURGERY    . LEAD REVISION N/A 07/25/2014   Procedure: LEAD REVISION;  Surgeon: Deboraha Sprang, MD;  Location[facility] C[facility] LAB;  Service: Cardiovascular;  Laterality: N/A;  . PERMANENT PACEMAKER INSERTION N/A 07/24/2014   Procedure: PERMANENT PACEMAKER INSERTION;  Surgeon: [name], MD;  Location: [facility] CATH LAB;  Service: Cardiovascular;  Laterality: N/A;    Family History  Problem Relation Age of Onset  . Heart attack Father   . Stroke Father   . Heart disease Brother     Family History  Father: deceased 31 yrs, CAD, CABG, CVA,dyslipidemia, diagnosed with CVA, CAD  Mother: alive, well  Paternal [place] Mother: Diabetes, diagnosed with DM  Brother 1: CAD, diagnosed with CAD  Sister 36: well  Paternal uncle: Diabetes, diagnosed with DM  2 brother(s) , 1 sister(s) . 1 son(s) , 1  daughter(s) .   neg fam hx for colon ca./polys and liver Dz?Marland Kitchen   Social History  General:  Tobacco use  cigarettes: Never smoked Tobacco history last updated 02/24/2016 no EXPOSURE TO PASSIVE SMOKE.  no Alcohol.  no Caffeine.  no Recreational drug use.  Exercise: yes, regularly, treadmill.    Gyn History  Sexual activity not currently sexually active.  Periods : postmenopausal.  Denies H/O LMP.  Denies H/O Birth control.  Last pap smear date 01/06/2016 Negative.  Last mammogram date 08/21/2015.    OB History  Number of pregnancies 5.  Pregnancy # 1 miscarriage.  Pregnancy #  2 miscarriage.  Pregnancy # 3 stillborn.  Pregnancy # 4: vaginal delivery.  Pregnancy # 5: vaginal delivery.    Allergies  Vicodin ES: dizzy : Allergy  Percocet: N/V: Allergy  ACE inhibitor: cough: Side Effects   Hospitalization/Major Diagnostic Procedure  No Hospitalization History.   Review of Systems  CONSTITUTIONAL:  no Chills. no Fever. no Night sweats.  HEENT:  Blurrred vision no. no Double vision.  CARDIOLOGY:  no Chest pain. Edema yes.  RESPIRATORY:  no Shortness of breath. no Cough.  UROLOGY:  no Urinary frequency. no Urinary incontinence. no Urinary urgency.  GASTROENTEROLOGY:  Abdominal pain yes, see HPI. no Appetite change. no Change in bowel movements.  FEMALE REPRODUCTIVE:  no Breast lumps or discharge. no Breast pain.  NEUROLOGY:  no Dizziness. no Headache. no Loss of consciousness.  PSYCHOLOGY:  no Anxiety. no Depression. no Confusion.  SKIN:  no Rash. no Hives.  HEMATOLOGY/LYMPH:  no Anemia. no Fatigue. Using Blood Thinners no.     Vital Signs  Wt 194, Wt change 1 lb, Ht 62, BMI 35.48, Pulse sitting 72, BP sitting 168/85.   Examination  General Examination: GENERAL APPEARANCE well developed, well nourished .  SKIN: warm and dry, no rashes .  NECK: supple, normal appearance .  LUNGS: regular breathing rate and effort .  HEART: no murmurs, regular rate and rhythm.  ABDOMEN: obese, soft and diffuse tenderness in lower abdomen, no rebound, no guarding .  FEMALE GENITOURINARY: normal external genitalia, labia - unremarkable, vagina - pink moist mucosa, no lesions, ~20cc of dark blood noted in vault cervix - no lesions or CMT, no active bleeding noted, adnexa - no masses or tenderness, uterus - nontender and normal size on palpation .  MUSCULOSKELETAL no calf tenderness bilaterally .  EXTREMITIES: 1+ edema .  PSYCH appropriate mood and affect .     A/P: 70 yo PM female who presents for hysteroscopy, D&C, possible myosure polypectomy due to  postmenopausal bleeding and simple/complex hyperplasia -NPO -LR @ 125cc/hr -SCDs to OR -Risk/benefit and alternatives reviewed with patient and her husband including risk of bleeding, infection and potential injury including uterine perforation.  Pt aware and wishes to proceed.

## 2016-02-29 ENCOUNTER — Encounter (HOSPITAL_COMMUNITY): Payer: Self-pay | Admitting: *Deleted

## 2016-02-29 ENCOUNTER — Ambulatory Visit (HOSPITAL_COMMUNITY): Payer: Commercial Managed Care - HMO | Admitting: Anesthesiology

## 2016-02-29 ENCOUNTER — Encounter (HOSPITAL_COMMUNITY)
Admission: RE | Disposition: A | Discharge status: Home / Self Care | Payer: Self-pay | Source: Ambulatory Visit | Attending: Obstetrics & Gynecology

## 2016-02-29 ENCOUNTER — Ambulatory Visit (HOSPITAL_COMMUNITY)
Admission: RE | Admit: 2016-02-29 | Discharge: 2016-02-29 | Disposition: A | Discharge status: Home / Self Care | Payer: Commercial Managed Care - HMO | Source: Ambulatory Visit | Attending: Obstetrics & Gynecology | Admitting: Obstetrics & Gynecology

## 2016-02-29 DIAGNOSIS — Z8249 Family history of ischemic heart disease and other diseases of the circulatory system: Secondary | ICD-10-CM | POA: Diagnosis not present

## 2016-02-29 DIAGNOSIS — N950 Postmenopausal bleeding: Secondary | ICD-10-CM | POA: Diagnosis not present

## 2016-02-29 DIAGNOSIS — I447 Left bundle-branch block, unspecified: Secondary | ICD-10-CM | POA: Insufficient documentation

## 2016-02-29 DIAGNOSIS — I10 Essential (primary) hypertension: Secondary | ICD-10-CM | POA: Diagnosis not present

## 2016-02-29 DIAGNOSIS — Z823 Family history of stroke: Secondary | ICD-10-CM | POA: Diagnosis not present

## 2016-02-29 DIAGNOSIS — N840 Polyp of corpus uteri: Secondary | ICD-10-CM | POA: Diagnosis not present

## 2016-02-29 DIAGNOSIS — Z8719 Personal history of other diseases of the digestive system: Secondary | ICD-10-CM | POA: Insufficient documentation

## 2016-02-29 DIAGNOSIS — N8501 Benign endometrial hyperplasia: Secondary | ICD-10-CM | POA: Insufficient documentation

## 2016-02-29 DIAGNOSIS — Z950 Presence of cardiac pacemaker: Secondary | ICD-10-CM | POA: Diagnosis not present

## 2016-02-29 DIAGNOSIS — Z8673 Personal history of transient ischemic attack (TIA), and cerebral infarction without residual deficits: Secondary | ICD-10-CM | POA: Insufficient documentation

## 2016-02-29 DIAGNOSIS — N8500 Endometrial hyperplasia, unspecified: Secondary | ICD-10-CM | POA: Diagnosis not present

## 2016-02-29 HISTORY — PX: DILATATION & CURETTAGE/HYSTEROSCOPY WITH MYOSURE: SHX6511

## 2016-02-29 SURGERY — DILATATION & CURETTAGE/HYSTEROSCOPY WITH MYOSURE
Anesthesia: General | Site: Vagina

## 2016-02-29 MED ORDER — ACETAMINOPHEN 500 MG PO TABS
ORAL_TABLET | ORAL | Status: AC
  Filled 2016-02-29: qty 1

## 2016-02-29 MED ORDER — EPHEDRINE SULFATE 50 MG/ML IJ SOLN
INTRAMUSCULAR | Status: DC | PRN
  Administered 2016-02-29: 10 mg via INTRAVENOUS

## 2016-02-29 MED ORDER — FENTANYL CITRATE (PF) 100 MCG/2ML IJ SOLN
INTRAMUSCULAR | Status: AC
  Filled 2016-02-29: qty 2

## 2016-02-29 MED ORDER — LACTATED RINGERS IV SOLN
INTRAVENOUS | Status: DC
  Administered 2016-02-29: 1000 mL via INTRAVENOUS

## 2016-02-29 MED ORDER — SODIUM CHLORIDE 0.9 % IR SOLN
Status: DC | PRN
  Administered 2016-02-29: 3000 mL

## 2016-02-29 MED ORDER — BUPIVACAINE LIPOSOME 1.3 % IJ SUSP
20.0000 mL | INTRAMUSCULAR | Status: DC
  Filled 2016-02-29: qty 20

## 2016-02-29 MED ORDER — ONDANSETRON HCL 4 MG/2ML IJ SOLN
INTRAMUSCULAR | Status: AC
  Filled 2016-02-29: qty 2

## 2016-02-29 MED ORDER — CHLORHEXIDINE GLUCONATE CLOTH 2 % EX PADS: 6.0000 | MEDICATED_PAD | Freq: Once | CUTANEOUS | Status: DC

## 2016-02-29 MED ORDER — GLYCOPYRROLATE 0.2 MG/ML IJ SOLN
INTRAMUSCULAR | Status: AC
  Filled 2016-02-29: qty 1

## 2016-02-29 MED ORDER — DEXAMETHASONE SODIUM PHOSPHATE 4 MG/ML IJ SOLN
INTRAMUSCULAR | Status: AC
  Filled 2016-02-29: qty 1

## 2016-02-29 MED ORDER — LIDOCAINE HCL (CARDIAC) 20 MG/ML IV SOLN
INTRAVENOUS | Status: AC
  Filled 2016-02-29: qty 5

## 2016-02-29 MED ORDER — PROPOFOL 10 MG/ML IV BOLUS
INTRAVENOUS | Status: DC | PRN
  Administered 2016-02-29: 120 mg via INTRAVENOUS
  Administered 2016-02-29: 30 mg via INTRAVENOUS

## 2016-02-29 MED ORDER — PROPOFOL 10 MG/ML IV BOLUS
INTRAVENOUS | Status: AC
  Filled 2016-02-29: qty 20

## 2016-02-29 MED ORDER — LIDOCAINE HCL (CARDIAC) 20 MG/ML IV SOLN
INTRAVENOUS | Status: DC | PRN
  Administered 2016-02-29: 60 mg via INTRAVENOUS

## 2016-02-29 MED ORDER — KETOROLAC TROMETHAMINE 30 MG/ML IJ SOLN
INTRAMUSCULAR | Status: AC
  Filled 2016-02-29: qty 1

## 2016-02-29 MED ORDER — LACTATED RINGERS IV SOLN: INTRAVENOUS | Status: DC

## 2016-02-29 MED ORDER — FENTANYL CITRATE (PF) 100 MCG/2ML IJ SOLN
INTRAMUSCULAR | Status: DC | PRN
  Administered 2016-02-29 (×2): 25 ug via INTRAVENOUS
  Administered 2016-02-29: 50 ug via INTRAVENOUS

## 2016-02-29 MED ORDER — ONDANSETRON HCL 4 MG/2ML IJ SOLN
INTRAMUSCULAR | Status: DC | PRN
  Administered 2016-02-29: 4 mg via INTRAVENOUS

## 2016-02-29 MED ORDER — ACETAMINOPHEN 500 MG PO TABS
1000.0000 mg | ORAL_TABLET | ORAL | Status: AC
  Administered 2016-02-29: 1000 mg via ORAL

## 2016-02-29 SURGICAL SUPPLY — 17 items
CANISTERS HI-FLOW 3000CC (CANNISTER) ×3 IMPLANT
CATH ROBINSON RED A/P 16FR (CATHETERS) ×3 IMPLANT
CLOTH BEACON ORANGE TIMEOUT ST (SAFETY) ×3 IMPLANT
CONTAINER PREFILL 10% NBF 60ML (FORM) ×6 IMPLANT
DEVICE MYOSURE LITE (MISCELLANEOUS) IMPLANT
DEVICE MYOSURE REACH (MISCELLANEOUS) IMPLANT
DILATOR CANAL MILEX (MISCELLANEOUS) IMPLANT
GLOVE BIOGEL PI IND STRL 6.5 (GLOVE) ×2 IMPLANT
GLOVE BIOGEL PI IND STRL 7.0 (GLOVE) ×1 IMPLANT
GLOVE BIOGEL PI INDICATOR 6.5 (GLOVE) ×4
GLOVE BIOGEL PI INDICATOR 7.0 (GLOVE) ×2
GLOVE ECLIPSE 6.5 STRL STRAW (GLOVE) ×3 IMPLANT
GOWN STRL REUS W/TWL LRG LVL3 (GOWN DISPOSABLE) ×6 IMPLANT
PACK VAGINAL MINOR WOMEN LF (CUSTOM PROCEDURE TRAY) ×3 IMPLANT
PAD OB MATERNITY 4.3X12.25 (PERSONAL CARE ITEMS) ×3 IMPLANT
SEAL ROD LENS SCOPE MYOSURE (ABLATOR) ×3 IMPLANT
TOWEL OR 17X24 6PK STRL BLUE (TOWEL DISPOSABLE) ×6 IMPLANT

## 2016-02-29 NOTE — Anesthesia Procedure Notes (Signed)
Procedure Name: LMA Insertion Date/Time: 02/29/2016 12:48 PM Performed by: Lyndle Herrlich Pre-anesthesia Checklist: Patient identified, Emergency Drugs available, Suction available, Patient being monitored and Timeout performed Patient Re-evaluated:Patient Re-evaluated prior to inductionOxygen Delivery Method: Circle system utilized Preoxygenation: Pre-oxygenation with 100% oxygen Intubation Type: IV induction Ventilation: Mask ventilation without difficulty LMA: LMA inserted LMA Size: 4.0 Number of attempts: 1 Placement Confirmation: positive ETCO2 and breath sounds checked- equal and bilateral

## 2016-02-29 NOTE — Discharge Instructions (Addendum)
°  Post Anesthesia Home Care Instructions  Activity: Get plenty of rest for the remainder of the day. A responsible adult should stay with you for 24 hours following the procedure.  For the next 24 hours, DO NOT: -Drive a car -Paediatric nurse -Drink alcoholic beverages -Take any medication unless instructed by your physician -Make any legal decisions or sign important papers.  Meals: Start with liquid foods such as gelatin or soup. Progress to regular foods as tolerated. Avoid greasy, spicy, heavy foods. If nausea and/or vomiting occur, drink only clear liquids until the nausea and/or vomiting subsides. Call your physician if vomiting continues.  Special Instructions/Symptoms: Your throat may feel dry or sore from the anesthesia or the breathing tube placed in your throat during surgery. If this causes discomfort, gargle with warm salt water. The discomfort should disappear within 24 hours.  If you had a scopolamine patch placed behind your ear for the management of post- operative nausea and/or vomiting:  1. The medication in the patch is effective for 72 hours, after which it should be removed.  Wrap patch in a tissue and discard in the trash. Wash hands thoroughly with soap and water. 2. You may remove the patch earlier than 72 hours if you experience unpleasant side effects which may include dry mouth, dizziness or visual disturbances. 3. Avoid touching the patch. Wash your hands with soap and water after contact with the patch.   HOME INSTRUCTIONS  Please note any unusual or excessive bleeding, pain, swelling. Mild dizziness or drowsiness are normal for about 24 hours after surgery.   Shower when comfortable  Restrictions: No driving for 24 hours or while taking pain medications.  Activity:  No heavy lifting (> 10 lbs), nothing in vagina (no tampons, douching, or intercourse) x 2 weeks; no tub baths for 2 weeks Vaginal spotting is expected but if your bleeding is heavy, period  like,  please call the office    Diet:  You may return to your regular diet.  Do not eat large meals.  Eat small frequent meals throughout the day.  Continue to drink a good amount of water at least 6-8 glasses of water per day, hydration is very important for the healing process.  Pain Management: Take Motrin and/or Tylenol as prescribed/needed for pain.  Always take prescription pain medication with food, it may cause constipation, increase fluids and fiber and you may want to take an over-the-counter stool softener like Colace as needed up to 2x a day.    Alcohol -- Avoid for 24 hours and while taking pain medications.  Nausea: Take sips of ginger ale or soda  Fever -- Call physician if temperature over 101 degrees  Follow up:  If you do not already have a follow up appointment scheduled, please call the office at [phone].  If you experience fever (a temperature greater than 100.4), pain unrelieved by pain medication, shortness of breath, swelling of a single leg, or any other symptoms which are concerning to you please the office immediately.

## 2016-02-29 NOTE — Transfer of Care (Signed)
Immediate Anesthesia Transfer of Care Note  Patient: Elaine Ford  Procedure(s) Performed: Procedure(s) with comments: DILATATION & CURETTAGE/HYSTEROSCOPY WITH MYOSURE (N/A) - patient has a pacemaker   Patient Location: PACU  Anesthesia Type:General  Level of Consciousness: awake, alert  and oriented  Airway & Oxygen Therapy: Patient Spontanous Breathing and Patient connected to nasal cannula oxygen  Post-op Assessment: Report given to RN, Post -op Vital signs reviewed and stable and Patient moving all extremities X 4  Post vital signs: Reviewed and stable  Last Vitals:  Vitals:   02/29/16 1129  BP: (!) 186/77  Pulse: 66  Resp: 16  Temp: 37.6 C    Last Pain:  Vitals:   02/29/16 1129  TempSrc: Oral  PainSc: 3       Patients Stated Pain Goal: 4 ([id number])  Complications: No apparent anesthesia complications

## 2016-02-29 NOTE — Anesthesia Preprocedure Evaluation (Addendum)
Anesthesia Evaluation  Patient identified by MRN, date of birth, ID band Patient awake    Reviewed: Allergy & Precautions, H&P , Patient's Chart, lab work & pertinent test results, reviewed documented beta blocker date and time   Airway Mallampati: IV  TM Distance: >3 FB Neck ROM: full    Dental no notable dental hx. (+)    Pulmonary    Pulmonary exam normal breath sounds clear to auscultation       Cardiovascular hypertension,  Rhythm:regular Rate:Normal     Neuro/Psych    GI/Hepatic   Endo/Other  diabetes  Renal/GU      Musculoskeletal   Abdominal   Peds  Hematology   Anesthesia Other Findings Hypertension       Pacemaker-St Jude 07/26/2014   Hx of cardiovascular stress test   Myoview 11/14: No ischemia Hx of cardiac cath   [facility] 07/24/14 at [facility] - no significant CAD Hx of echocardiogram   Echo 07/23/14 - at [facility]: EF 65-70%, impaired relaxation, mild LAE, mild MR, mild AI, mild elevated PASP (44.2 mmHg) S/P cardiac pacemaker procedure  07/2014 St. Jude - Dr. Lovena Le LBBB (left bundle branch block)   Stroke [facility])  patient was unaware noted prior to pacermaker placement  Gestational diabetes   Back pain         Reproductive/Obstetrics                            Anesthesia Physical Anesthesia Plan  ASA: III  Anesthesia Plan:    Post-op Pain Management:    Induction: Intravenous  Airway Management Planned: LMA  Additional Equipment:   Intra-op Plan:   Post-operative Plan:   Informed Consent: I have reviewed the patients History and Physical, chart, labs and discussed the procedure including the risks, benefits and alternatives for the proposed anesthesia with the patient or authorized representative who has indicated his/her understanding and acceptance.   Dental Advisory Given and Dental advisory given  Plan Discussed with: CRNA and Surgeon  Anesthesia Plan Comments:  (Discussed GA with LMA, possible sore throat, potential need to switch to ETT, N/V, pulmonary aspiration. Questions answered. )        Anesthesia Quick Evaluation

## 2016-02-29 NOTE — Op Note (Signed)
Operative Report  PreOp: Postmenopausal bleeding, Simple and complex uterine hyperplasia PostOp: same Procedure:  Hysteroscopy, Dilation and Curettage Surgeon: Dr. [name] Anesthesia: General Complications:none EBL: [id number] UOP: 50cc IVF:600cc  Findings:Anteverted uterus with proliferative endometrium, both ostia visualized  Specimens: 1) endocervical curettings 2) endometrial curettings  Procedure: The patient was taken to the operating room where she underwent general anesthesia without difficulty. The patient was placed in a low lithotomy position using Allen stirrups. The patient was examined with the findings as noted above.  She was then prepped and draped in the normal sterile fashion. The bladder was drained using a red rubber urethral catheter. A sterile speculum was inserted into the vagina. A single tooth tenaculum was placed on the anterior lip of the cervix. The endocervical canal was then serially dilated to 16French using Hank dilators.  The diagnostic hysteroscope was then inserted without difficulty and noted to have the findings as listed above. Visualization was achieved using NS as a distending medium. The hysteroscope was removed and sharp curettage was performed. The tissue was sent to pathology. The hysteroscope was reinserted, no perforation was seen.  All instrument were then removed. Hemostasis was observed at the cervical site. The patient was repositioned to the supine position. The patient tolerated the procedure without any complications and taken to recovery in stable condition.   [name], DO [phone] (pager) [phone] (office)

## 2016-02-29 NOTE — Interval H&P Note (Signed)
History and Physical Interval Note:  02/29/2016 12:09 PM  Elaine Ford  has presented today for surgery, with the diagnosis of  Uterine Polyp Endometrial Hyperplasia PMB  The various methods of treatment have been discussed with the patient and family. After consideration of risks, benefits and other options for treatment, the patient has consented to  Procedure(s) with comments: Klemme (N/A) - patient has a pacemaker  as a surgical intervention .  The patient's history has been reviewed, patient examined, no change in status, stable for surgery.  I have reviewed the patient's chart and labs.  Questions were answered to the patient's satisfaction.     [name], M

## 2016-02-29 NOTE — Anesthesia Postprocedure Evaluation (Signed)
Anesthesia Post Note  Patient: CORENA DEBOSE  Procedure(s) Performed: Procedure(s) (LRB): DILATATION & CURETTAGE/HYSTEROSCOPY WITH MYOSURE (N/A)  Patient location during evaluation: PACU Anesthesia Type: General Level of consciousness: sedated Pain management: satisfactory to patient Vital Signs Assessment: post-procedure vital signs reviewed and stable Respiratory status: spontaneous breathing Cardiovascular status: stable Anesthetic complications: no     Last Vitals:  Vitals:   02/29/16 1500 02/29/16 1515  BP: (!) 144/64 (!) 146/65  Pulse: 60 62  Resp: 15 (!) 21  Temp:  37.5 C    Last Pain:  Vitals:   02/29/16 1500  TempSrc:   PainSc: 0-No pain   Pain Goal: Patients Stated Pain Goal: 4 (02/29/16 1129)               Lyndle Herrlich EDWARD

## 2016-03-01 ENCOUNTER — Encounter (HOSPITAL_COMMUNITY): Payer: Self-pay | Admitting: Obstetrics & Gynecology

## 2016-03-16 DIAGNOSIS — R102 Pelvic and perineal pain: Secondary | ICD-10-CM | POA: Diagnosis not present

## 2016-03-16 DIAGNOSIS — I10 Essential (primary) hypertension: Secondary | ICD-10-CM | POA: Diagnosis not present

## 2016-03-16 DIAGNOSIS — N950 Postmenopausal bleeding: Secondary | ICD-10-CM | POA: Diagnosis not present

## 2016-03-16 DIAGNOSIS — I459 Conduction disorder, unspecified: Secondary | ICD-10-CM | POA: Diagnosis not present

## 2016-03-17 ENCOUNTER — Telehealth: Payer: Self-pay | Admitting: Internal Medicine

## 2016-03-17 NOTE — Telephone Encounter (Signed)
New message  Diane wants to know if the pt needs another appt besides the one scheduled in October for the clearance. Please call.     Request for surgical clearance:  What type of surgery is being performed? hysteretomy 1. When is this surgery scheduled? no  2. Are there any medications that need to be held prior to surgery and how long? not sure  3. Name of physician performing surgery? Dr. Nelda Marseille  What is your office phone and fax number? [phone], [phone]

## 2016-03-17 NOTE — Telephone Encounter (Signed)
May proceed with surgery per Dr Lovena Le.  Low risk

## 2016-03-17 NOTE — Telephone Encounter (Signed)
Follow up call   Beverlee Nims from Dr. Nelda Marseille is wanting to do the surgery by end of September , beginning of October

## 2016-03-31 ENCOUNTER — Inpatient Hospital Stay (HOSPITAL_COMMUNITY)
Admission: AD | Admit: 2016-03-31 | Discharge: 2016-03-31 | Disposition: A | Discharge status: Home / Self Care | Payer: Commercial Managed Care - HMO | Source: Ambulatory Visit | Attending: Obstetrics & Gynecology | Admitting: Obstetrics & Gynecology

## 2016-03-31 ENCOUNTER — Encounter (HOSPITAL_COMMUNITY): Payer: Self-pay

## 2016-03-31 DIAGNOSIS — Z9889 Other specified postprocedural states: Secondary | ICD-10-CM | POA: Insufficient documentation

## 2016-03-31 DIAGNOSIS — Z888 Allergy status to other drugs, medicaments and biological substances status: Secondary | ICD-10-CM | POA: Insufficient documentation

## 2016-03-31 DIAGNOSIS — Z79899 Other long term (current) drug therapy: Secondary | ICD-10-CM | POA: Insufficient documentation

## 2016-03-31 DIAGNOSIS — I10 Essential (primary) hypertension: Secondary | ICD-10-CM | POA: Diagnosis not present

## 2016-03-31 DIAGNOSIS — I447 Left bundle-branch block, unspecified: Secondary | ICD-10-CM | POA: Insufficient documentation

## 2016-03-31 DIAGNOSIS — N950 Postmenopausal bleeding: Secondary | ICD-10-CM | POA: Diagnosis not present

## 2016-03-31 DIAGNOSIS — N939 Abnormal uterine and vaginal bleeding, unspecified: Secondary | ICD-10-CM | POA: Diagnosis not present

## 2016-03-31 DIAGNOSIS — Z8673 Personal history of transient ischemic attack (TIA), and cerebral infarction without residual deficits: Secondary | ICD-10-CM | POA: Insufficient documentation

## 2016-03-31 DIAGNOSIS — Z950 Presence of cardiac pacemaker: Secondary | ICD-10-CM | POA: Diagnosis not present

## 2016-03-31 LAB — CBC
HCT: 37.5 % (ref 36.0–46.0)
HEMOGLOBIN: 12.4 g/dL (ref 12.0–15.0)
MCH: 27.1 pg (ref 26.0–34.0)
MCHC: 33.1 g/dL (ref 30.0–36.0)
MCV: 82.1 fL (ref 78.0–100.0)
PLATELETS: 316 10*3/uL (ref 150–400)
RBC: 4.57 MIL/uL (ref 3.87–5.11)
RDW: 14.3 % (ref 11.5–15.5)
WBC: 7.2 10*3/uL (ref 4.0–10.5)

## 2016-03-31 MED ORDER — NORETHINDRONE ACETATE 5 MG PO TABS: 10.0000 mg | ORAL_TABLET | Freq: Every day | ORAL | 0 refills | Status: DC

## 2016-03-31 NOTE — MAU Note (Signed)
Pt reports she is having increased vaginal bleeding. Had D&c on 8/21. Given  Tranexamic Acid to help control bleeding. Finished Rx yesterday and still bleeding.

## 2016-03-31 NOTE — MAU Provider Note (Signed)
History     CSN: [id number]  Arrival date and time: 03/31/16 1352   First Provider Initiated Contact with Patient 03/31/16 1708      Chief Complaint  Patient presents with  . Vaginal Bleeding   HPI Elaine Ford is a 70 y.o. female who presents with vaginal bleeding. Patient being managed by Dr. Nelda Marseille for post menopausal bleeding. Is scheduled for hysterectomy next month. Just finished tranexamic acid yesterday and reports increase in vaginal bleeding since then. Has not saturated pads and no clots. States they called her PCP & gyn, were told to come to MAU.  PMH significant for hypertension. Pt normally takes Coreg but hasn't taken it in 3 days d/t low BPs at home. Spouse took BP today at home, 130/80. Denies chest pain or SOB.   Past Medical History:  Diagnosis Date  . Back pain   . Broken arm    age 58 years old broke right arm after a fall  . Gall bladder stones   . Gestational diabetes   . Hemorrhoid   . Hx of cardiac cath    [facility] 07/24/14 at [facility] - no significant CAD  . Hx of cardiovascular stress test    Myoview 11/14:  No ischemia  . Hx of echocardiogram    Echo 07/23/14 - at [facility]:  EF 65-70%, impaired relaxation, mild LAE, mild MR, mild AI, mild elevated PASP (44.2 mmHg)  . Hyperlipidemia   . Hypertension   . LBBB (left bundle branch block)   . Low blood pressure   . Nerve damage    right arm  . Pain    Left upper chest s/p pacer maker insertion site 07/2014  . S/P cardiac pacemaker procedure 07/2014   St. Jude - Dr. Lovena Le  . Stroke [facility])    patient was unaware noted prior to pacermaker placement unknown when it occurred    Past Surgical History:  Procedure Laterality Date  . COLONOSCOPY    . DILATATION & CURETTAGE/HYSTEROSCOPY WITH MYOSURE N/A 02/29/2016   Procedure: DILATATION & CURETTAGE/HYSTEROSCOPY WITH MYOSURE;  Surgeon: [name], DO;  Location: [facility];  Service: Gynecology;  Laterality: N/A;  patient has a pacemaker   . GALLBLADDER SURGERY    . LEAD  REVISION N/A 07/25/2014   Procedure: LEAD REVISION;  Surgeon: Deboraha Sprang, MD;  Location: [facility] C[facility] LAB;  Service: Cardiovascular;  Laterality: N/A;  . PERMANENT PACEMAKER INSERTION N/A 07/24/2014   Procedure: PERMANENT PACEMAKER INSERTION;  Surgeon: [name], MD;  Location: [facility] CATH LAB;  Service: Cardiovascular;  Laterality: N/A;    Family History  Problem Relation Age of Onset  . Heart attack Father   . Stroke Father   . Heart disease Brother     Social History  Substance Use Topics  . Smoking status: Never Smoker  . Smokeless tobacco: Never Used  . Alcohol use No    Allergies:  Allergies  Allergen Reactions  . Ace Inhibitors Cough  . Codeine     unknown  . Hydrocodone-Acetaminophen Other (See Comments)  . Lisinopril-Hydrochlorothiazide Cough  . Oxycodone-Acetaminophen Nausea And Vomiting    Prescriptions Prior to Admission  Medication Sig Dispense Refill Last Dose  . acetaminophen (TYLENOL) 500 MG tablet Take 500 mg by mouth 2 (two) times daily as needed for moderate pain.   03/30/2016 at Unknown time  . calcium-vitamin D 250-100 MG-UNIT tablet Take 1 tablet by mouth 2 (two) times daily.   Past Week at Unknown time  . carvedilol (COREG)  25 MG tablet Take 1 tablet (25 mg total) by mouth 2 (two) times daily. 180 tablet 3 Past Week at Unknown time  . Coenzyme Q10 (COQ-10 PO) Take 1 capsule by mouth daily.    Past Week at Unknown time  . Multiple Vitamins-Minerals (MULTIVITAMIN & MINERAL PO) Take by mouth.   Past Week at Unknown time  . tranexamic acid (LYSTEDA) 650 MG TABS tablet Take 2 tablets by mouth 3 (three) times daily.   03/31/2016 at Unknown time  . VITAMIN E PO Take 1 tablet by mouth daily.    Past Week at Unknown time  . aspirin 81 MG tablet Take 81 mg by mouth daily.   july    Review of Systems  Constitutional: Negative for chills and fever.  Respiratory: Negative for shortness of breath.   Cardiovascular: Negative for chest pain.  Gastrointestinal:  Positive for abdominal pain.  Genitourinary:       Vaginal bleeding   Physical Exam   Blood pressure 175/66, pulse 100, temperature 99.3 F (37.4 C), temperature source Oral, resp. rate 16, height 5\' 3"  (1.6 m), weight 187 lb 1.6 oz (84.9 kg), SpO2 100 %.  Physical Exam  Nursing note and vitals reviewed. Constitutional: She is oriented to person, place, and time. She appears well-developed and well-nourished. No distress.  HENT:  Head: Normocephalic and atraumatic.  Eyes: Conjunctivae are normal. Right eye exhibits no discharge. Left eye exhibits no discharge. No scleral icterus.  Neck: Normal range of motion.  Cardiovascular: Normal rate, regular rhythm and normal heart sounds.   No murmur heard. Respiratory: Effort normal and breath sounds normal. No respiratory distress. She has no wheezes.  GI: Soft. There is no tenderness.  Genitourinary:  Genitourinary Comments: Pad that pt put on this morning had area of bright red blood ~3x5 in.  Current pad has smear of bright red blood ~1x3 in.   Neurological: She is alert and oriented to person, place, and time.  Skin: Skin is warm and dry. She is not diaphoretic.  Psychiatric: She has a normal mood and affect. Her behavior is normal. Judgment and thought content normal.    MAU Course  Procedures Results for orders placed or performed during the hospital encounter of 03/31/16 (from the past 24 hour(s))  CBC     Status: None   Collection Time: 03/31/16  4:08 PM  Result Value Ref Range   WBC 7.2 4.0 - 10.5 K/uL   RBC 4.57 3.87 - 5.11 MIL/uL   Hemoglobin 12.4 12.0 - 15.0 g/dL   HCT 37.5 36.0 - 46.0 %   MCV 82.1 78.0 - 100.0 fL   MCH 27.1 26.0 - 34.0 pg   MCHC 33.1 30.0 - 36.0 g/dL   RDW 14.3 11.5 - 15.5 %   Platelets 316 150 - 400 K/uL    MDM CBC -- hemoglobin stable S/w Dr. Landry Mellow regarding patient -- will start on aygestin 10 mg daily. Pt to call office tomorrow for f/u with Dr. Nelda Marseille  Assessment and Plan  A; 1.  Postmenopausal bleeding     P; Discharge home Rx aygestin 10 mg PO daily Call office in AM to schedule f/u with Dr. Nelda Marseille Continue taking Coreg as prescribed  Jorje Guild 03/31/2016, 5:55 PM

## 2016-03-31 NOTE — MAU Note (Signed)
Urine in lab 

## 2016-03-31 NOTE — Discharge Instructions (Signed)
Abnormal Uterine Bleeding Abnormal uterine bleeding means bleeding from the vagina that is not your normal menstrual period. This can be:  Bleeding or spotting between periods.  Bleeding after sex (sexual intercourse).  Bleeding that is heavier or more than normal.  Periods that last longer than usual.  Bleeding after menopause. There are many problems that may cause this. Treatment will depend on the cause of the bleeding. Any kind of bleeding that is not normal should be reviewed by your doctor.  HOME CARE Watch your condition for any changes. These actions may lessen any discomfort you are having:  Do not use tampons or douches as told by your doctor.  Change your pads often. You should get regular pelvic exams and Pap tests. Keep all appointments for tests as told by your doctor. GET HELP IF:  You are bleeding for more than 1 week.  You feel dizzy at times. GET HELP RIGHT AWAY IF:   You pass out.  You have to change pads every 15 to 30 minutes.  You have belly pain.  You have a fever.  You become sweaty or weak.  You are passing large blood clots from the vagina.  You feel sick to your stomach (nauseous) and throw up (vomit). MAKE SURE YOU:  Understand these instructions.  Will watch your condition.  Will get help right away if you are not doing well or get worse.   This information is not intended to replace advice given to you by your health care provider. Make sure you discuss any questions you have with your health care provider.   Document Released: 04/24/2009 Document Revised: 07/02/2013 Document Reviewed: 01/24/2013 Elsevier Interactive Patient Education [employer].

## 2016-04-06 NOTE — Patient Instructions (Signed)
Your procedure is scheduled on:  Wednesday, Oct. 11, 2017  Enter through the [employer] [facility] at:  10:15 AM  Pick up the phone at the desk and dial [phone].  Call this number if you have problems the morning of surgery: [phone].  Remember: Do NOT eat food or drink after:  Midnight Tuesday, Oct. 10, 2017  Take these medicines the morning of surgery with a SIP OF WATER:  Carvedilol  Stop taking Vitamin E at this time  Do NOT wear jewelry (body piercing), metal hair clips/bobby pins, make-up, or nail polish. Do NOT wear lotions, powders, or perfumes.  You may wear deodorant. Do NOT shave for 48 hours prior to surgery. Do NOT bring valuables to the hospital. Contacts, dentures, or bridgework may not be worn into surgery.  Leave suitcase in car.  After surgery it may be brought to your room.  For patients admitted to the hospital, checkout time is 11:00 AM the day of discharge.

## 2016-04-07 ENCOUNTER — Encounter (HOSPITAL_COMMUNITY): Payer: Self-pay

## 2016-04-07 ENCOUNTER — Encounter (HOSPITAL_COMMUNITY)
Admission: RE | Admit: 2016-04-07 | Discharge: 2016-04-07 | Disposition: A | Discharge status: Home / Self Care | Payer: Commercial Managed Care - HMO | Source: Ambulatory Visit | Attending: Obstetrics & Gynecology | Admitting: Obstetrics & Gynecology

## 2016-04-07 DIAGNOSIS — I10 Essential (primary) hypertension: Secondary | ICD-10-CM | POA: Insufficient documentation

## 2016-04-07 DIAGNOSIS — Z79899 Other long term (current) drug therapy: Secondary | ICD-10-CM | POA: Insufficient documentation

## 2016-04-07 DIAGNOSIS — Z01812 Encounter for preprocedural laboratory examination: Secondary | ICD-10-CM | POA: Diagnosis not present

## 2016-04-07 DIAGNOSIS — I459 Conduction disorder, unspecified: Secondary | ICD-10-CM | POA: Diagnosis not present

## 2016-04-07 LAB — COMPREHENSIVE METABOLIC PANEL
ALBUMIN: 4 g/dL (ref 3.5–5.0)
ALT: 40 U/L (ref 14–54)
ANION GAP: 8 (ref 5–15)
AST: 31 U/L (ref 15–41)
Alkaline Phosphatase: 49 U/L (ref 38–126)
BUN: 6 mg/dL (ref 6–20)
CHLORIDE: 108 mmol/L (ref 101–111)
CO2: 24 mmol/L (ref 22–32)
Calcium: 9.4 mg/dL (ref 8.9–10.3)
Creatinine, Ser: 0.73 mg/dL (ref 0.44–1.00)
GFR calc Af Amer: >60 mL/min (ref >60)
GFR calc non Af Amer: >60 mL/min (ref >60)
GLUCOSE: 143 mg/dL — AB (ref 65–99)
POTASSIUM: 3.7 mmol/L (ref 3.5–5.1)
SODIUM: 140 mmol/L (ref 135–145)
TOTAL PROTEIN: 7.8 g/dL (ref 6.5–8.1)
Total Bilirubin: 0.4 mg/dL (ref 0.3–1.2)

## 2016-04-07 LAB — CBC
HCT: 38 % (ref 36.0–46.0)
Hemoglobin: 12.2 g/dL (ref 12.0–15.0)
MCH: 27.2 pg (ref 26.0–34.0)
MCHC: 32.1 g/dL (ref 30.0–36.0)
MCV: 84.8 fL (ref 78.0–100.0)
PLATELETS: 319 10*3/uL (ref 150–400)
RBC: 4.48 MIL/uL (ref 3.87–5.11)
RDW: 14.8 % (ref 11.5–15.5)
WBC: 6.3 10*3/uL (ref 4.0–10.5)

## 2016-04-18 DIAGNOSIS — I10 Essential (primary) hypertension: Secondary | ICD-10-CM | POA: Diagnosis not present

## 2016-04-18 DIAGNOSIS — Z01818 Encounter for other preprocedural examination: Secondary | ICD-10-CM | POA: Diagnosis not present

## 2016-04-18 DIAGNOSIS — N950 Postmenopausal bleeding: Secondary | ICD-10-CM | POA: Diagnosis not present

## 2016-04-18 DIAGNOSIS — I459 Conduction disorder, unspecified: Secondary | ICD-10-CM | POA: Diagnosis not present

## 2016-04-18 DIAGNOSIS — R102 Pelvic and perineal pain: Secondary | ICD-10-CM | POA: Diagnosis not present

## 2016-04-18 NOTE — H&P (Signed)
70yo PM female who presents for preop appt for TVH, BSO, possible LAVH due to recurrent PMB and pelvic pain. In review, initial EMB showed simple and complex hyperplasia with a follow up [facility], D&C with benign pathology. She has been treated with several types of oral progesterone with intermittent improvement of her bleeding. Sometimes it would improve, but then some weeks it would come back like a regular period. At this point, she wishes to proceed with surgical intervention. Since her last visit, the bleeding finally stopped on the 27th; however, she is is still having intermittent pelvic pain. Work up has included the following: US performed on 6/30: 5.6x6.9x12.4cm uterus with 63mm endometrial lining and focal echogenic mass measuring about 2cm surrounded by fluid suggestive of polyp. Normal ovaries bilaterally.  EMB 01/14/16: Simple and complex hyperplasia no atypia 8/21: Final surgical pathology- negative for hyperplasia or malignancy. Benign endometriod-type polyp.   Current Medications  Taking   Tylenol(APAP) 325 MG Tablet 2 tablets as needed Orally   Carvedilol 25 MG Tablet 1 tablet Orally twice a day   Triamcinolone Acetonide 0.1 % Ointment 1 application to affected area Externally Daily   Norethindrone Acetate 5 MG Tablet 1 tablet Orally daily   Discontinued   Megestrol Acetate 40 MG Tablet 1 tablet Orally Once a day   Lysteda 650 MG Tablet 2 tablets Orally Three times daily   Medication List reviewed and reconciled with the patient    Past Medical History  heart block - has pacer 07/2014 per Dr. Lovena Le.   Hypertension.   Obesity.   Dyslipidemia.   Insomnia.   Hemorrhoids.   Heart murmur.   Osteoporosis.           Surgical History  D&C 2005  cholecystectomy 1976  Pacemaker 07/25/2014  colonoscopy 11/2015  hysteroscopy for Postmenopausal bleeding 02/29/2016   Family History  Father: deceased 50 yrs, CAD, CABG, CVA,dyslipidemia, diagnosed with CVA, CAD  Mother:  alive, well  Paternal [place] Mother: Diabetes, diagnosed with DM  Brother 1: CAD, diagnosed with CAD  Sister 44: well  Paternal uncle: Diabetes, diagnosed with DM  2 brother(s) , 1 sister(s) . 1 son(s) , 1 daughter(s) .   neg fam hx for colon ca./polys and liver Dz?Marland Kitchen   Social History  General:  Tobacco use  cigarettes: Never smoked Tobacco history last updated 04/18/2016 no EXPOSURE TO PASSIVE SMOKE.  no Alcohol.  no Caffeine.  no Recreational drug use.  Exercise: yes, regularly, treadmill.    Gyn History  Sexual activity not currently sexually active.  Periods : postmenopausal.  Denies H/O LMP.  Denies H/O Birth control.  Last pap smear date 01/06/2016 Negative.  Last mammogram date 08/21/2015.    OB History  Number of pregnancies 5.  Pregnancy # 1 miscarriage.  Pregnancy # 2 miscarriage.  Pregnancy # 3 stillborn.  Pregnancy # 4: vaginal delivery.  Pregnancy # 5: vaginal delivery.    Allergies  Vicodin ES: dizzy : Allergy  Percocet: N/V: Allergy  ACE inhibitor: cough: Side Effects   Hospitalization/Major Diagnostic Procedure  No Hospitalization History.   Review of Systems  CONSTITUTIONAL:  no Chills. no Fever. no Night sweats.  HEENT:  Blurrred vision no. no Double vision.  CARDIOLOGY:  no Chest pain. Edema yes.  RESPIRATORY:  no Shortness of breath. no Cough.  UROLOGY:  no Urinary urgency. no Urinary frequency. Urinary incontinence yes, see HPI.  GASTROENTEROLOGY:  Abdominal pain yes, see HPI. no Appetite change. no Change in bowel movements.  FEMALE REPRODUCTIVE:  no Breast lumps or discharge. no Breast pain.  NEUROLOGY:  no Dizziness. no Headache. no Loss of consciousness.  PSYCHOLOGY:  no Anxiety. no Depression. no Confusion.  SKIN:  no Rash. no Hives.  HEMATOLOGY/LYMPH:  no Anemia. no Fatigue. Using Blood Thinners no.     Vital Signs  Wt 196, Wt change 5 lb, Ht 62, BMI 35.84, Pulse sitting 63, BP sitting 164/75.   Examination  General  Examination: GENERAL APPEARANCE well developed, well nourished .  SKIN: warm and dry, no rashes .  NECK: supple, normal appearance .  LUNGS: regular breathing rate and effort .  HEART: regular rate and rhythm.  ABDOMEN: obese, soft and diffuse tenderness in lower abdomen, no rebound, no guarding .  FEMALE GENITOURINARY: deferred.  MUSCULOSKELETAL no calf tenderness bilaterally .  EXTREMITIES: 1+ edema .  PSYCH appropriate mood and affect .     A/P: 70yo PM female with recurrent postmenopausal bleeding and endometrial hyperplasia -NPO -LR @ 125cc/hr -Ancef 2g IV -SCDs to OR -Prior cardiac clearance obtained -Pt scheduled for TVH, BSO on Wednesday with plans for possible LAVH to ensure removal of adnexal. Discussed R/B and indications for surgery. Reviewed risk of bleeding infection and injury to surrounding organs. Reviewed recovery and hospital stay. Questions and concerns were addressed and pt wishes to proceed  Elaine Pupa, DO [phone] (pager) [phone] (office)

## 2016-04-20 ENCOUNTER — Observation Stay (HOSPITAL_COMMUNITY)
Admission: RE | Admit: 2016-04-20 | Discharge: 2016-04-21 | Disposition: A | Discharge status: Home / Self Care | Payer: Commercial Managed Care - HMO | Source: Ambulatory Visit | Attending: Obstetrics & Gynecology | Admitting: Obstetrics & Gynecology

## 2016-04-20 ENCOUNTER — Encounter (HOSPITAL_COMMUNITY): Payer: Self-pay

## 2016-04-20 ENCOUNTER — Ambulatory Visit (HOSPITAL_COMMUNITY): Payer: Commercial Managed Care - HMO | Admitting: Anesthesiology

## 2016-04-20 ENCOUNTER — Encounter (HOSPITAL_COMMUNITY)
Admission: RE | Disposition: A | Discharge status: Home / Self Care | Payer: Self-pay | Source: Ambulatory Visit | Attending: Obstetrics & Gynecology

## 2016-04-20 DIAGNOSIS — Y836 Removal of other organ (partial) (total) as the cause of abnormal reaction of the patient, or of later complication, without mention of misadventure at the time of the procedure: Secondary | ICD-10-CM | POA: Insufficient documentation

## 2016-04-20 DIAGNOSIS — Z6834 Body mass index (BMI) 34.0-34.9, adult: Secondary | ICD-10-CM | POA: Insufficient documentation

## 2016-04-20 DIAGNOSIS — D251 Intramural leiomyoma of uterus: Secondary | ICD-10-CM | POA: Diagnosis not present

## 2016-04-20 DIAGNOSIS — N840 Polyp of corpus uteri: Secondary | ICD-10-CM | POA: Diagnosis not present

## 2016-04-20 DIAGNOSIS — I10 Essential (primary) hypertension: Secondary | ICD-10-CM | POA: Insufficient documentation

## 2016-04-20 DIAGNOSIS — N800 Endometriosis of uterus: Secondary | ICD-10-CM | POA: Diagnosis not present

## 2016-04-20 DIAGNOSIS — M810 Age-related osteoporosis without current pathological fracture: Secondary | ICD-10-CM | POA: Insufficient documentation

## 2016-04-20 DIAGNOSIS — Z888 Allergy status to other drugs, medicaments and biological substances status: Secondary | ICD-10-CM | POA: Insufficient documentation

## 2016-04-20 DIAGNOSIS — E669 Obesity, unspecified: Secondary | ICD-10-CM | POA: Diagnosis not present

## 2016-04-20 DIAGNOSIS — N950 Postmenopausal bleeding: Secondary | ICD-10-CM | POA: Diagnosis not present

## 2016-04-20 DIAGNOSIS — E119 Type 2 diabetes mellitus without complications: Secondary | ICD-10-CM | POA: Insufficient documentation

## 2016-04-20 DIAGNOSIS — R011 Cardiac murmur, unspecified: Secondary | ICD-10-CM | POA: Diagnosis not present

## 2016-04-20 DIAGNOSIS — N9971 Accidental puncture and laceration of a genitourinary system organ or structure during a genitourinary system procedure: Secondary | ICD-10-CM | POA: Insufficient documentation

## 2016-04-20 DIAGNOSIS — Z950 Presence of cardiac pacemaker: Secondary | ICD-10-CM | POA: Diagnosis not present

## 2016-04-20 DIAGNOSIS — I459 Conduction disorder, unspecified: Secondary | ICD-10-CM | POA: Insufficient documentation

## 2016-04-20 DIAGNOSIS — Z885 Allergy status to narcotic agent status: Secondary | ICD-10-CM | POA: Insufficient documentation

## 2016-04-20 DIAGNOSIS — E785 Hyperlipidemia, unspecified: Secondary | ICD-10-CM | POA: Insufficient documentation

## 2016-04-20 DIAGNOSIS — R102 Pelvic and perineal pain: Secondary | ICD-10-CM | POA: Diagnosis not present

## 2016-04-20 DIAGNOSIS — D259 Leiomyoma of uterus, unspecified: Secondary | ICD-10-CM | POA: Diagnosis not present

## 2016-04-20 HISTORY — PX: VAGINAL HYSTERECTOMY: SHX2639

## 2016-04-20 HISTORY — PX: LAPAROSCOPIC BILATERAL SALPINGO OOPHERECTOMY: SHX5890

## 2016-04-20 LAB — TYPE AND SCREEN
ABO/RH(D): O POS
Antibody Screen: NEGATIVE

## 2016-04-20 LAB — ABO/RH: ABO/RH(D): O POS

## 2016-04-20 LAB — GLUCOSE, CAPILLARY: GLUCOSE-CAPILLARY: 111 mg/dL — AB (ref 65–99)

## 2016-04-20 SURGERY — HYSTERECTOMY, VAGINAL
Laterality: Bilateral

## 2016-04-20 MED ORDER — KETOROLAC TROMETHAMINE 30 MG/ML IJ SOLN: 30.0000 mg | Freq: Four times a day (QID) | INTRAMUSCULAR | Status: DC

## 2016-04-20 MED ORDER — FENTANYL CITRATE (PF) 100 MCG/2ML IJ SOLN
INTRAMUSCULAR | Status: AC
  Filled 2016-04-20: qty 2

## 2016-04-20 MED ORDER — LACTATED RINGERS IV SOLN: INTRAVENOUS | Status: DC

## 2016-04-20 MED ORDER — DEXTROSE 5 % IV SOLN
INTRAVENOUS | Status: DC | PRN
  Administered 2016-04-20: 20 ug/min via INTRAVENOUS

## 2016-04-20 MED ORDER — SUGAMMADEX SODIUM 200 MG/2ML IV SOLN
INTRAVENOUS | Status: DC | PRN
  Administered 2016-04-20: 174.6 mg via INTRAVENOUS

## 2016-04-20 MED ORDER — LIDOCAINE-EPINEPHRINE (PF) 1 %-1:200000 IJ SOLN
INTRAMUSCULAR | Status: DC | PRN
  Administered 2016-04-20: 26 mL

## 2016-04-20 MED ORDER — HYDROMORPHONE HCL 1 MG/ML IJ SOLN
INTRAMUSCULAR | Status: AC
  Filled 2016-04-20: qty 1

## 2016-04-20 MED ORDER — PROPOFOL 10 MG/ML IV BOLUS
INTRAVENOUS | Status: AC
  Filled 2016-04-20: qty 20

## 2016-04-20 MED ORDER — DEXAMETHASONE SODIUM PHOSPHATE 4 MG/ML IJ SOLN
INTRAMUSCULAR | Status: DC | PRN
  Administered 2016-04-20: 4 mg via INTRAVENOUS

## 2016-04-20 MED ORDER — HYDROMORPHONE HCL 1 MG/ML IJ SOLN: 0.2000 mg | INTRAMUSCULAR | Status: DC | PRN

## 2016-04-20 MED ORDER — OXYCODONE HCL 5 MG PO TABS: 5.0000 mg | ORAL_TABLET | Freq: Once | ORAL | Status: DC | PRN

## 2016-04-20 MED ORDER — SCOPOLAMINE 1 MG/3DAYS TD PT72: 1.0000 | MEDICATED_PATCH | Freq: Once | TRANSDERMAL | Status: DC

## 2016-04-20 MED ORDER — MIDAZOLAM HCL 2 MG/2ML IJ SOLN
INTRAMUSCULAR | Status: DC | PRN
  Administered 2016-04-20: 1 mg via INTRAVENOUS

## 2016-04-20 MED ORDER — CEFAZOLIN SODIUM-DEXTROSE 2-4 GM/100ML-% IV SOLN
2.0000 g | INTRAVENOUS | Status: AC
  Administered 2016-04-20: 2 g via INTRAVENOUS

## 2016-04-20 MED ORDER — LACTATED RINGERS IR SOLN
Status: DC | PRN
  Administered 2016-04-20: 3000 mL

## 2016-04-20 MED ORDER — FENTANYL CITRATE (PF) 250 MCG/5ML IJ SOLN
INTRAMUSCULAR | Status: DC | PRN
  Administered 2016-04-20 (×2): 50 ug via INTRAVENOUS

## 2016-04-20 MED ORDER — OXYCODONE HCL 5 MG/5ML PO SOLN: 5.0000 mg | Freq: Once | ORAL | Status: DC | PRN

## 2016-04-20 MED ORDER — LIDOCAINE HCL (CARDIAC) 20 MG/ML IV SOLN
INTRAVENOUS | Status: DC | PRN
  Administered 2016-04-20: 100 mg via INTRAVENOUS

## 2016-04-20 MED ORDER — ONDANSETRON HCL 4 MG PO TABS: 4.0000 mg | ORAL_TABLET | Freq: Four times a day (QID) | ORAL | Status: DC | PRN

## 2016-04-20 MED ORDER — LIDOCAINE HCL (CARDIAC) 20 MG/ML IV SOLN
INTRAVENOUS | Status: AC
  Filled 2016-04-20: qty 5

## 2016-04-20 MED ORDER — HYDROMORPHONE HCL 1 MG/ML IJ SOLN
INTRAMUSCULAR | Status: DC | PRN
  Administered 2016-04-20: 0.5 mg via INTRAVENOUS

## 2016-04-20 MED ORDER — LACTATED RINGERS IV SOLN
INTRAVENOUS | Status: DC
  Administered 2016-04-20 – 2016-04-21 (×3): via INTRAVENOUS

## 2016-04-20 MED ORDER — ROCURONIUM BROMIDE 100 MG/10ML IV SOLN
INTRAVENOUS | Status: AC
  Filled 2016-04-20: qty 1

## 2016-04-20 MED ORDER — KETOROLAC TROMETHAMINE 15 MG/ML IJ SOLN
15.0000 mg | Freq: Four times a day (QID) | INTRAMUSCULAR | Status: DC
  Administered 2016-04-21 (×2): 15 mg via INTRAVENOUS
  Filled 2016-04-20 (×4): qty 1

## 2016-04-20 MED ORDER — BUPIVACAINE HCL (PF) 0.25 % IJ SOLN
INTRAMUSCULAR | Status: DC | PRN
  Administered 2016-04-20: 30 mL

## 2016-04-20 MED ORDER — PROPOFOL 10 MG/ML IV BOLUS
INTRAVENOUS | Status: DC | PRN
  Administered 2016-04-20: 150 mg via INTRAVENOUS

## 2016-04-20 MED ORDER — SIMETHICONE 80 MG PO CHEW: 80.0000 mg | CHEWABLE_TABLET | Freq: Four times a day (QID) | ORAL | Status: DC | PRN

## 2016-04-20 MED ORDER — PROMETHAZINE HCL 25 MG/ML IJ SOLN: 6.2500 mg | INTRAMUSCULAR | Status: DC | PRN

## 2016-04-20 MED ORDER — OXYCODONE-ACETAMINOPHEN 5-325 MG PO TABS
1.0000 | ORAL_TABLET | ORAL | Status: DC | PRN
  Administered 2016-04-21: 1 via ORAL
  Filled 2016-04-20: qty 1

## 2016-04-20 MED ORDER — DEXAMETHASONE SODIUM PHOSPHATE 10 MG/ML IJ SOLN
INTRAMUSCULAR | Status: AC
  Filled 2016-04-20: qty 1

## 2016-04-20 MED ORDER — SENNA 8.6 MG PO TABS
1.0000 | ORAL_TABLET | Freq: Two times a day (BID) | ORAL | Status: DC
Start: 2016-04-20 — End: 2016-04-21
  Administered 2016-04-21 (×2): 8.6 mg via ORAL
  Filled 2016-04-20 (×4): qty 1

## 2016-04-20 MED ORDER — LIDOCAINE-EPINEPHRINE (PF) 1 %-1:200000 IJ SOLN
INTRAMUSCULAR | Status: AC
  Filled 2016-04-20: qty 30

## 2016-04-20 MED ORDER — FENTANYL CITRATE (PF) 100 MCG/2ML IJ SOLN
25.0000 ug | INTRAMUSCULAR | Status: DC | PRN
  Administered 2016-04-20 (×4): 50 ug via INTRAVENOUS

## 2016-04-20 MED ORDER — MENTHOL 3 MG MT LOZG: 1.0000 | LOZENGE | OROMUCOSAL | Status: DC | PRN

## 2016-04-20 MED ORDER — GLYCOPYRROLATE 0.2 MG/ML IJ SOLN
INTRAMUSCULAR | Status: DC | PRN
  Administered 2016-04-20: .05 mg via INTRAVENOUS

## 2016-04-20 MED ORDER — PANTOPRAZOLE SODIUM 40 MG PO TBEC
40.0000 mg | DELAYED_RELEASE_TABLET | Freq: Every day | ORAL | Status: DC
  Administered 2016-04-21: 40 mg via ORAL
  Filled 2016-04-20: qty 1

## 2016-04-20 MED ORDER — FENTANYL CITRATE (PF) 250 MCG/5ML IJ SOLN
INTRAMUSCULAR | Status: AC
  Filled 2016-04-20: qty 5

## 2016-04-20 MED ORDER — PHENYLEPHRINE 8 MG IN D5W 100 ML (0.08MG/ML) PREMIX OPTIME
INJECTION | INTRAVENOUS | Status: AC
  Filled 2016-04-20: qty 100

## 2016-04-20 MED ORDER — MIDAZOLAM HCL 2 MG/2ML IJ SOLN
INTRAMUSCULAR | Status: AC
  Filled 2016-04-20: qty 2

## 2016-04-20 MED ORDER — ONDANSETRON HCL 4 MG/2ML IJ SOLN: 4.0000 mg | Freq: Four times a day (QID) | INTRAMUSCULAR | Status: DC | PRN

## 2016-04-20 MED ORDER — LACTATED RINGERS IV SOLN
INTRAVENOUS | Status: DC
  Administered 2016-04-20 (×2): via INTRAVENOUS
  Administered 2016-04-20: 125 mL/h via INTRAVENOUS

## 2016-04-20 MED ORDER — BISACODYL 5 MG PO TBEC
5.0000 mg | DELAYED_RELEASE_TABLET | Freq: Every day | ORAL | Status: DC | PRN
  Filled 2016-04-20: qty 1

## 2016-04-20 MED ORDER — FENTANYL CITRATE (PF) 100 MCG/2ML IJ SOLN: 50.0000 ug | INTRAMUSCULAR | Status: DC | PRN

## 2016-04-20 MED ORDER — KETOROLAC TROMETHAMINE 15 MG/ML IJ SOLN
15.0000 mg | Freq: Four times a day (QID) | INTRAMUSCULAR | Status: DC
  Filled 2016-04-20 (×4): qty 1

## 2016-04-20 MED ORDER — BUPIVACAINE HCL (PF) 0.25 % IJ SOLN
INTRAMUSCULAR | Status: AC
  Filled 2016-04-20: qty 30

## 2016-04-20 MED ORDER — CARVEDILOL 25 MG PO TABS
25.0000 mg | ORAL_TABLET | Freq: Two times a day (BID) | ORAL | Status: DC
  Administered 2016-04-20 – 2016-04-21 (×2): 25 mg via ORAL
  Filled 2016-04-20 (×4): qty 1

## 2016-04-20 MED ORDER — ONDANSETRON HCL 4 MG/2ML IJ SOLN
INTRAMUSCULAR | Status: DC | PRN
  Administered 2016-04-20: 4 mg via INTRAVENOUS

## 2016-04-20 MED ORDER — ROCURONIUM BROMIDE 100 MG/10ML IV SOLN
INTRAVENOUS | Status: DC | PRN
  Administered 2016-04-20: 15 mg via INTRAVENOUS
  Administered 2016-04-20: 40 mg via INTRAVENOUS
  Administered 2016-04-20: 20 mg via INTRAVENOUS
  Administered 2016-04-20: 10 mg via INTRAVENOUS

## 2016-04-20 SURGICAL SUPPLY — 41 items
APPLICATOR ARISTA FLEXITIP XL (MISCELLANEOUS) ×4 IMPLANT
CLOTH BEACON ORANGE TIMEOUT ST (SAFETY) ×4 IMPLANT
DEVICE TROCAR PUNCTURE CLOSURE (ENDOMECHANICALS) ×4 IMPLANT
DRSG OPSITE POSTOP 3X4 (GAUZE/BANDAGES/DRESSINGS) IMPLANT
DURAPREP 26ML APPLICATOR (WOUND CARE) ×4 IMPLANT
ELECT BLADE 6.5 EXT (BLADE) ×4 IMPLANT
ELECT LIGASURE LONG (ELECTRODE) ×4 IMPLANT
ELECT LIGASURE SHORT 9 REUSE (ELECTRODE) IMPLANT
GAUZE PACKING IODOFORM 2 (PACKING) ×4 IMPLANT
GLOVE BIOGEL PI IND STRL 6.5 (GLOVE) ×6 IMPLANT
GLOVE BIOGEL PI IND STRL 7.0 (GLOVE) ×2 IMPLANT
GLOVE BIOGEL PI INDICATOR 6.5 (GLOVE) ×6
GLOVE BIOGEL PI INDICATOR 7.0 (GLOVE) ×2
GLOVE ECLIPSE 6.5 STRL STRAW (GLOVE) ×8 IMPLANT
HEMOSTAT ARISTA ABSORB 3G PWDR (MISCELLANEOUS) ×4 IMPLANT
LEGGING LITHOTOMY PAIR STRL (DRAPES) IMPLANT
LIQUID BAND (GAUZE/BANDAGES/DRESSINGS) IMPLANT
NEEDLE INSUFFLATION 120MM (ENDOMECHANICALS) ×4 IMPLANT
PACK LAVH (CUSTOM PROCEDURE TRAY) ×4 IMPLANT
PACK ROBOTIC GOWN (GOWN DISPOSABLE) ×4 IMPLANT
PACK TRENDGUARD 450 HYBRID PRO (MISCELLANEOUS) ×2 IMPLANT
PACK TRENDGUARD 600 HYBRD PROC (MISCELLANEOUS) IMPLANT
PAD OB MATERNITY 4.3X12.25 (PERSONAL CARE ITEMS) ×4 IMPLANT
POUCH SPECIMEN RETRIEVAL 10MM (ENDOMECHANICALS) ×4 IMPLANT
PROTECTOR NERVE ULNAR (MISCELLANEOUS) ×8 IMPLANT
SET IRRIG TUBING LAPAROSCOPIC (IRRIGATION / IRRIGATOR) ×4 IMPLANT
SHEARS HARMONIC ACE PLUS 36CM (ENDOMECHANICALS) ×4 IMPLANT
SLEEVE XCEL OPT CAN 5 100 (ENDOMECHANICALS) ×4 IMPLANT
SUT MON AB 4-0 PS1 27 (SUTURE) IMPLANT
SUT VIC AB 0 CT1 18XCR BRD8 (SUTURE) ×2 IMPLANT
SUT VIC AB 0 CT1 36 (SUTURE) ×8 IMPLANT
SUT VIC AB 0 CT1 8-18 (SUTURE) ×2
SUT VICRYL 0 TIES 12 18 (SUTURE) ×4 IMPLANT
TOWEL OR 17X24 6PK STRL BLUE (TOWEL DISPOSABLE) ×8 IMPLANT
TRAY FOLEY CATH SILVER 14FR (SET/KITS/TRAYS/PACK) ×4 IMPLANT
TRENDGUARD 450 HYBRID PRO PACK (MISCELLANEOUS) ×4
TRENDGUARD 600 HYBRID PROC PK (MISCELLANEOUS)
TROCAR XCEL NON-BLD 11X100MML (ENDOMECHANICALS) ×4 IMPLANT
TROCAR XCEL NON-BLD 5MMX100MML (ENDOMECHANICALS) ×4 IMPLANT
WARMER LAPAROSCOPE (MISCELLANEOUS) ×4 IMPLANT
WATER STERILE IRR 1000ML POUR (IV SOLUTION) ×4 IMPLANT

## 2016-04-20 NOTE — Interval H&P Note (Signed)
History and Physical Interval Note:  04/20/2016 10:51 AM  [name]  has presented today for surgery, with the diagnosis of N95.0 PMB R10.2 Pelvic Pain  The various methods of treatment have been discussed with the patient and family. After consideration of risks, benefits and other options for treatment, the patient has consented to  Procedure(s):  VAGINAL HYSTERECTOMY WITH SALPINGO OOPHORECTOMY (Bilateral), possible laparoscopic bilateral salpingo oophorectomy as a surgical intervention .  The patient's history has been reviewed, patient examined, no change in status, stable for surgery.  I have reviewed the patient's chart and labs.  Questions were answered to the patient's satisfaction.     Elaine Ford, M

## 2016-04-20 NOTE — Transfer of Care (Signed)
Immediate Anesthesia Transfer of Care Note  Patient: Elaine Ford  Procedure(s) Performed: Procedure(s): HYSTERECTOMY VAGINAL LAPAROSCOPIC BILATERAL SALPINGO OOPHORECTOMY  Patient Location: PACU  Anesthesia Type:General  Level of Consciousness: awake, alert  and oriented  Airway & Oxygen Therapy: Patient Spontanous Breathing and Patient connected to nasal cannula oxygen  Post-op Assessment: Report given to RN, Post -op Vital signs reviewed and stable and Patient moving all extremities  Post vital signs: Reviewed and stable  Last Vitals:  Vitals:   04/20/16 1046  BP: (!) 178/76  Pulse: 62  Resp: 20  Temp: 37.3 C    Last Pain:  Vitals:   04/20/16 1046  TempSrc: Oral  PainSc: 8       Patients Stated Pain Goal: 4 ([id number])  Complications: No apparent anesthesia complications

## 2016-04-20 NOTE — Anesthesia Procedure Notes (Signed)
Procedure Name: Intubation Date/Time: 04/20/2016 12:47 PM Performed by: Flossie Dibble Pre-anesthesia Checklist: Suction available, Emergency Drugs available, Timeout performed, Patient identified and Patient being monitored Patient Re-evaluated:Patient Re-evaluated prior to inductionOxygen Delivery Method: Circle system utilized Preoxygenation: Pre-oxygenation with 100% oxygen Intubation Type: IV induction Ventilation: Mask ventilation with difficulty, Two handed mask ventilation required and Oral airway inserted - appropriate to patient size Laryngoscope Size: Glidescope and 3 Grade View: Grade I Tube type: Oral Tube size: 7.0 mm Number of attempts: 1 Airway Equipment and Method: Stylet and Video-laryngoscopy Placement Confirmation: ETT inserted through vocal cords under direct vision,  positive ETCO2 and breath sounds checked- equal and bilateral Secured at: 20 (At the teeth.) cm Tube secured with: Tape Dental Injury: Teeth and Oropharynx as per pre-operative assessment  Difficulty Due To: Difficulty was anticipated, Difficult Airway- due to limited oral opening and Difficult Airway- due to anterior larynx

## 2016-04-20 NOTE — Anesthesia Preprocedure Evaluation (Addendum)
Anesthesia Evaluation  Patient identified by MRN, date of birth, ID band Patient awake    Reviewed: Allergy & Precautions, H&P , Patient's Chart, lab work & pertinent test results, reviewed documented beta blocker date and time   Airway Mallampati: IV  TM Distance: >3 FB Neck ROM: full    Dental no notable dental hx. (+)    Pulmonary    Pulmonary exam normal breath sounds clear to auscultation       Cardiovascular hypertension, + pacemaker  Rhythm:regular Rate:Normal     Neuro/Psych CVA    GI/Hepatic negative GI ROS, Neg liver ROS,   Endo/Other  diabetes  Renal/GU negative Renal ROS     Musculoskeletal   Abdominal   Peds  Hematology   Anesthesia Other Findings Hypertension       Pacemaker-St Jude 07/26/2014   Hx of cardiovascular stress test   Myoview 11/14: No ischemia Hx of cardiac cath   [facility] 07/24/14 at [facility] - no significant CAD Hx of echocardiogram   Echo 07/23/14 - at [facility]: EF 65-70%, impaired relaxation, mild LAE, mild MR, mild AI, mild elevated PASP (44.2 mmHg) S/P cardiac pacemaker procedure  07/2014 [employer] Jude - Dr. Lovena Le LBBB (left bundle branch block)   Stroke [facility])  patient was unaware noted prior to pacermaker placement  Gestational diabetes   Back pain         Reproductive/Obstetrics                            Anesthesia Physical  Anesthesia Plan  ASA: III  Anesthesia Plan:    Post-op Pain Management:    Induction: Intravenous  Airway Management Planned: Oral ETT  Additional Equipment:   Intra-op Plan:   Post-operative Plan:   Informed Consent: I have reviewed the patients History and Physical, chart, labs and discussed the procedure including the risks, benefits and alternatives for the proposed anesthesia with the patient or authorized representative who has indicated his/her understanding and acceptance.   Dental Advisory Given and Dental advisory  given  Plan Discussed with: CRNA and Surgeon  Anesthesia Plan Comments: (Discussed GA with LMA, possible sore throat, potential need to switch to ETT, N/V, pulmonary aspiration. Questions answered. )        Anesthesia Quick Evaluation

## 2016-04-20 NOTE — Op Note (Signed)
Preoperative Diagnosis: Postmenopausal bleeding Postoperative Diagnosis: same  Procedure:  Vaginal hysterectomy, Laprascopic bilateral salpingo-oophorectomy  Surgeon: Dr. [name]  Assistant: Dr. Christophe Louis Anesthetic: General  IVF: 2500cc  EBL: 825cc  UOP: 475cc   Specimens: 1) Uterus with cervix 2) Bilateral fallopian tubes and ovaries.  Findings: No free fluid or omental studding appreciated. Normal appearing liver, gallbladder and bowel. Filmy adhesions noted between the left pelvic side wall and bowel.  Enlarged bulky uterus, no discrete mass appreciated.  Normal fallopian tubes and ovaries bilaterally.   Procedure: The patient was taken to the operating room where general anesthesia was found to be adequate. The patient's abdomen was prepped with ChloraPrep. The perineum and vagina were prepped with multiple layers of Betadine. The patient was sterilely draped. A Foley catheter was placed in the bladder.  The cervix was injected with half percent Marcaine with epinephrine. The cervix was circumferentially incised with the bovie and the bladder was dissected off the pubovesical cervical fascia. The posterior cul-de-sac was entered sharply.  In a similar fashion, the anterior cul-de-sac was entered sharply with some difficulty due to the patient's narrow introitus.  A heany clamp was placed over the uterosacral ligaments bilaterally. These were transected and suture ligated with 0 vicryl. The cardinal ligaments were then clamped bilaterally and transected and suture ligated in a similar fashion using the Ligasure.  Due to the bulky size of the uterus, visualization was difficult and close reinspection and assessment was performed throughout this portion of the procedure.  The uterine arteries were clamped, cut and ligated with the Ligasure.   Transection of both the cardinal and broad ligaments needed to be done in sections due to the bulkiness of the uterus.  Single tooth tenaculums were  used to flip the uterus, ligate remaining attachments and remove the uterus.  Due to the difficulty with visualization vaginally, plan was made to proceed with BSO laparoscopically.  Angle stitches were placed.  The vaginal cuff was closed using 0-vicryl in a running fashion.    Gown and gloves were changed and attention was turned to the abdomen.  Quarter percent Marcaine was injected in the supraumbilical area.  An incision was made and the Veress needle was inserted into the abdominal cavity without difficulty. Proper placement was confirmed using the saline drop test and opening pressure was 57mmHg. A pneumoperitoneum was obtained. The laparoscopic trocar and the laparoscope were placed under direct visualization. Two additional ports were placed in the right and left lower quadrants. In a similar fashion, each area was injected with quarter percent Marcaine.  The right lower quadrant was a 60mm and the left lower quadrant was a 2mm.  Each trocar was inserted under direct visualization. ~ 200cc of blood was noted in the abdomen, suction irrigation was performed.  The Kleppinger was used to obtain hemostasis.  Attention was turned to the right adnexa and the ureter was idenitifed. The infundibulopelvic ligament was clamped, elevated, ligated and divided using the Harmonic. Serial ligation was performed. Attention was turned to the left adnexa.  Due to the bowel adhesions the ureter was not identified; however care was taken to avoid proximity to the pelvic side wall.  The left infundibulopelvic ligament was clamped, elevated, ligated and divided using the Harmonic and serial ligation was performed to remove the tube and ovary. A laparoscopic bag was placed and both adnexa were removed in their entirety.  Suction/irrigation was performed and hemostasis was obtained.  Arista was placed in the pelvis.  The fascial  closure device was used to close the 10mm left lower quadrant incision.  The pneumoperitoneum was  allowed to escape and the trocars were removed.  The left lower quadrant incision was closed with monocryl and Dermabond was placed over the three trocar sites.  A final vaginal inspection was performed- a small perineal laceration was noted and repaired using 2-0 vicryl.  Iodoform vaginal packing was placed.  The patient tolerated her procedure well. She was awakened from her anesthetic without difficulty and then transported to the recovery room in stable condition. Sponge, needle, and instrument counts were correct. Dr. Landry Mellow assisted in completion of this surgery due to the complexity of the case and since resident's are not available to our service.   [name], DO  [phone] (pager)  [phone] (office)

## 2016-04-21 ENCOUNTER — Encounter (HOSPITAL_COMMUNITY): Payer: Self-pay | Admitting: Obstetrics & Gynecology

## 2016-04-21 DIAGNOSIS — M810 Age-related osteoporosis without current pathological fracture: Secondary | ICD-10-CM | POA: Diagnosis not present

## 2016-04-21 DIAGNOSIS — D251 Intramural leiomyoma of uterus: Secondary | ICD-10-CM | POA: Diagnosis not present

## 2016-04-21 DIAGNOSIS — N9971 Accidental puncture and laceration of a genitourinary system organ or structure during a genitourinary system procedure: Secondary | ICD-10-CM | POA: Diagnosis not present

## 2016-04-21 DIAGNOSIS — I10 Essential (primary) hypertension: Secondary | ICD-10-CM | POA: Diagnosis not present

## 2016-04-21 DIAGNOSIS — E785 Hyperlipidemia, unspecified: Secondary | ICD-10-CM | POA: Diagnosis not present

## 2016-04-21 DIAGNOSIS — N950 Postmenopausal bleeding: Secondary | ICD-10-CM | POA: Diagnosis not present

## 2016-04-21 DIAGNOSIS — I459 Conduction disorder, unspecified: Secondary | ICD-10-CM | POA: Diagnosis not present

## 2016-04-21 DIAGNOSIS — N800 Endometriosis of uterus: Secondary | ICD-10-CM | POA: Diagnosis not present

## 2016-04-21 DIAGNOSIS — N840 Polyp of corpus uteri: Secondary | ICD-10-CM | POA: Diagnosis not present

## 2016-04-21 LAB — CBC
HCT: 24.7 % — ABNORMAL LOW (ref 36.0–46.0)
Hemoglobin: 8.1 g/dL — ABNORMAL LOW (ref 12.0–15.0)
MCH: 25.8 pg — ABNORMAL LOW (ref 26.0–34.0)
MCHC: 32.8 g/dL (ref 30.0–36.0)
MCV: 78.7 fL (ref 78.0–100.0)
PLATELETS: 283 10*3/uL (ref 150–400)
RBC: 3.14 MIL/uL — AB (ref 3.87–5.11)
RDW: 14.4 % (ref 11.5–15.5)
WBC: 13 10*3/uL — AB (ref 4.0–10.5)

## 2016-04-21 LAB — BASIC METABOLIC PANEL
ANION GAP: 7 (ref 5–15)
BUN: 12 mg/dL (ref 6–20)
CO2: 25 mmol/L (ref 22–32)
Calcium: 7.7 mg/dL — ABNORMAL LOW (ref 8.9–10.3)
Chloride: 100 mmol/L — ABNORMAL LOW (ref 101–111)
Creatinine, Ser: 0.88 mg/dL (ref 0.44–1.00)
GFR calc Af Amer: >60 mL/min (ref >60)
GFR calc non Af Amer: >60 mL/min (ref >60)
Glucose, Bld: 152 mg/dL — ABNORMAL HIGH (ref 65–99)
POTASSIUM: 3.8 mmol/L (ref 3.5–5.1)
SODIUM: 132 mmol/L — AB (ref 135–145)

## 2016-04-21 MED ORDER — OXYCODONE-ACETAMINOPHEN 5-325 MG PO TABS: 1.0000 | ORAL_TABLET | Freq: Four times a day (QID) | ORAL | 0 refills | Status: DC | PRN

## 2016-04-21 MED ORDER — IBUPROFEN 600 MG PO TABS: 600.0000 mg | ORAL_TABLET | Freq: Four times a day (QID) | ORAL | 0 refills | Status: DC | PRN

## 2016-04-21 MED ORDER — IBUPROFEN 600 MG PO TABS
600.0000 mg | ORAL_TABLET | Freq: Four times a day (QID) | ORAL | Status: DC | PRN
  Administered 2016-04-21: 600 mg via ORAL
  Filled 2016-04-21: qty 1

## 2016-04-21 MED ORDER — DOCUSATE SODIUM 100 MG PO CAPS: 100.0000 mg | ORAL_CAPSULE | Freq: Two times a day (BID) | ORAL | 0 refills | Status: DC

## 2016-04-21 NOTE — Discharge Instructions (Signed)
Vaginal hysterectomy and laparoscopic removal of ovaries, After care Refer to this sheet in the next few weeks. These instructions provide you with information on caring for yourself after your procedure. Your health care provider may also give you more specific instructions. Your treatment has been planned according to current medical practices, but problems sometimes occur. Call your health care provider if you have any problems or questions after your procedure. WHAT TO EXPECT AFTER THE PROCEDURE After your procedure, it is typical to have the following:  Abdominal pain. You will be given pain medicine to control it.  Sore throat from the breathing tube that was inserted during surgery. HOME CARE INSTRUCTIONS  For pain management you can alternate between Ibuprofen (advil) or Tylenol as needed for pain.  If you need stronger medication you may take the prescribed Percocet (oxycodone) instead of the Tylenol.  Percocet may cause constipation, be sure to take the stool softener (colace) twice daily as needed.  Do not take aspirin. It can cause bleeding.  Do not drive when taking pain medicine.  Follow your health care provider's advice regarding diet, exercise, lifting, driving, and general activities.  Resume your usual diet as directed and allowed.  Get plenty of rest and sleep.  Do not douche, use tampons, or have sexual intercourse for at least 6 weeks, or until your health care provider gives you permission.  Change your bandages (dressings) as directed by your health care provider.  Monitor your temperature and notify your health care provider of a fever.  Take showers instead of baths for 2-3 weeks.  Do not drink alcohol until your health care provider gives you permission.  If you develop constipation, you may take a mild laxative with your health care provider's permission. Bran foods may help with constipation problems. Drinking enough fluids to keep your urine clear or  pale yellow may help as well.  Try to have someone home with you for 1-2 weeks to help around the house.  Keep all of your follow-up appointments as directed by your health care provider. SEEK MEDICAL CARE IF:   You have swelling, redness, or increasing pain around your incision sites.  You have pus coming from your incision.  You notice a bad smell coming from your incision.  Your incision breaks open.  You feel dizzy or lightheaded.  You have pain or bleeding when you urinate.  You have persistent diarrhea.  You have persistent nausea and vomiting.  You have abnormal vaginal discharge.  You have a rash.  You have any type of abnormal reaction or develop an allergy to your medicine.  You have poor pain control with your prescribed medicine. SEEK IMMEDIATE MEDICAL CARE IF:   You have a fever.  You have severe abdominal pain.  You have chest pain.  You have shortness of breath.  You faint.  You have pain, swelling, or redness in your leg.  You have heavy vaginal bleeding with blood clots. MAKE SURE YOU:  Understand these instructions.  Will watch your condition.  Will get help right away if you are not doing well or get worse.   This information is not intended to replace advice given to you by your health care provider. Make sure you discuss any questions you have with your health care provider.   Document Released: 06/16/2011 Document Revised: 07/02/2013 Document Reviewed: 01/10/2013 Elsevier Interactive Patient Education [employer].

## 2016-04-21 NOTE — Anesthesia Postprocedure Evaluation (Signed)
Anesthesia Post Note  Patient: Elaine Ford  Procedure(s) Performed: Procedure(s): HYSTERECTOMY VAGINAL LAPAROSCOPIC BILATERAL SALPINGO OOPHORECTOMY  Patient location during evaluation: [facility] Anesthesia Type: General Level of consciousness: awake and alert Pain management: pain level controlled Vital Signs Assessment: post-procedure vital signs reviewed and stable Respiratory status: spontaneous breathing, nonlabored ventilation and respiratory function stable Cardiovascular status: blood pressure returned to baseline and stable Postop Assessment: no signs of nausea or vomiting Anesthetic complications: no     Last Vitals:  Vitals:   04/21/16 0056 04/21/16 0541  BP: (!) 150/56 (!) 120/49  Pulse: 64 63  Resp: 18 18  Temp: 37.6 C 37.2 C    Last Pain:  Vitals:   04/21/16 0552  TempSrc:   PainSc: 3    Pain Goal: Patients Stated Pain Goal: 4 (04/21/16 0552)               Riki Sheer

## 2016-04-21 NOTE — Progress Notes (Signed)
Postoperative Note Day # 1  S:  Patient resting comfortable in bed.  Pain controlled.  Tolerating general diet. + flatus this am, no BM.  Vaginal packing in place, pad dry.  Pt did not ambulate or stand overnight.  She denies n/v/f/c, SOB, or CP.    O: Temp:  [97.7 F (36.5 C)-99.6 F (37.6 C)] 98.9 F (37.2 C) (10/12 0541) Pulse Rate:  [60-69] 63 (10/12 0541) Resp:  [8-20] 18 (10/12 0541) BP: (120-180)/(49-76) 120/49 (10/12 0541) SpO2:  [99 %-100 %] 100 % (10/12 0541) Weight:  [87.3 kg (192 lb 8 oz)] 87.3 kg (192 lb 8 oz) (10/12 0541)  ~250cc in bag this am since 6am  Gen: A&Ox3, NAD CV: RRR Resp: CTAB Abdomen: soft, ND, +BS, appropriately tender,  Trocar sites C/D/I with dermabond Ext: No edema, no calf tenderness bilaterally, SCDs in place  Labs:  CBC Latest Ref Rng & Units 04/21/2016 04/07/2016 03/31/2016  WBC 4.0 - 10.5 K/uL 13.0(H) 6.3 7.2  Hemoglobin 12.0 - 15.0 g/dL 8.1(L) 12.2 12.4  Hematocrit 36.0 - 46.0 % 24.7(L) 38.0 37.5  Platelets 150 - 400 K/uL 283 319 316    A/P: Pt is a 70 y.o. postmenopausal female s/p TVH, LSC BSO, POD#1  - Pain well controlled, will transition to oral medication -GU: UOP is adequate, plan to remove foley this am -GI: Tolerating general diet -Activity: encouraged sitting up to chair and ambulation as tolerated -Prophylaxis: SCDs while in bed, early ambulation -Labs: appropriate due to EBL, pt asymptomatic, will monitor closely this am  DISPO:  Pt to work on ambulation and will continue to monitor UOP.  If pain remains controlled and continues to meet postoperative milestones will consider discharge home later today.  [name], DO [phone] (pager) [phone] (office)

## 2016-04-21 NOTE — Progress Notes (Signed)
Pt discharge instructions explained to patient, husband and daughter, verbalized understanding and copy of instructions given to patient in [place] [facility] [place] and prescription given for percocet.  Pt d/c home with family and escorted out by RN by wheelchair.

## 2016-04-22 NOTE — Discharge Summary (Signed)
Physician Discharge Summary  Patient ID: Elaine Ford MRN: [id number] DOB/AGE: [birth year] 70 y.o.  Admit date: 04/20/2016 Discharge date: 04/21/2016  Admission Diagnosis: Postemenopausal bleeding, Pelvic pain  Discharge Diagnoses:  Active Problems:   Postmenopausal bleeding   Discharged Condition: stable  Hospital Course: 70yo postmenopausal female who presents for scheduled surgery due to postmenopausal bleeding and pelvic pain.  She underwent a TVH, laparascopic BSO.  Information regarding the procedure can be found in the operative report.  Her postoperative course was uncomplicated and she was discharged home in stable condition on POD#1.  Consults: None  Significant Diagnostic Studies: labs:  CBC Latest Ref Rng & Units 04/21/2016 04/07/2016 03/31/2016  WBC 4.0 - 10.5 K/uL 13.0(H) 6.3 7.2  Hemoglobin 12.0 - 15.0 g/dL 8.1(L) 12.2 12.4  Hematocrit 36.0 - 46.0 % 24.7(L) 38.0 37.5  Platelets 150 - 400 K/uL 283 319 316    Treatments: IV hydration, antibiotics: Ancef, analgesia: Toradol, Ibuprofen, Percocet and surgery: TVH, laparoscopic bilateral salpingo-oophorectomy  Discharge Exam: Blood pressure (!) 133/48, pulse 68, temperature 99.1 F (37.3 C), temperature source Oral, resp. rate 17, height 5\' 3"  (1.6 m), weight 87.3 kg (192 lb 8 oz), SpO2 100 %.  Gen: A&Ox3, NAD CV: RRR Resp: CTAB Abdomen: soft, ND, +BS, appropriately tender,  Trocar sites C/D/I with dermabond Ext: No edema, no calf tenderness bilaterally, SCDs in place Gen: A&Ox3, NAD CV: RRR Resp: CTAB Abdomen: soft, ND, +BS, appropriately tender,  Trocar sites C/D/I with dermabond Ext: No edema, no calf tenderness bilaterally, SCDs in place  Disposition: 01-Home or Self Care     Medication List    STOP taking these medications   acetaminophen 500 MG tablet Commonly known as:  TYLENOL     TAKE these medications   CALCIUM 600+D 600-400 MG-UNIT tablet Generic drug:  Calcium Carbonate-Vitamin D Take  1 tablet by mouth daily.   carvedilol 25 MG tablet Commonly known as:  COREG Take 1 tablet (25 mg total) by mouth 2 (two) times daily.   COQ-10 PO Take 1 capsule by mouth daily.   docusate sodium 100 MG capsule Commonly known as:  COLACE Take 1 capsule (100 mg total) by mouth 2 (two) times daily.   ibuprofen 600 MG tablet Commonly known as:  ADVIL,MOTRIN Take 1 tablet (600 mg total) by mouth every 6 (six) hours as needed for moderate pain.   multivitamin with minerals Tabs tablet Take 1 tablet by mouth daily.   oxyCODONE-acetaminophen 5-325 MG tablet Commonly known as:  PERCOCET/ROXICET Take 1 tablet by mouth every 6 (six) hours as needed (moderate to severe pain (when tolerating fluids)).   VITAMIN E PO Take 1 capsule by mouth daily.      Follow-up Information    [name], M, DO Follow up in 2 week(s).   Specialty:  Obstetrics and Gynecology Contact information: [id number] [place] [address] [phone]           Signed: Annalee Genta 04/22/2016, 10:02 AM

## 2016-04-27 ENCOUNTER — Ambulatory Visit (INDEPENDENT_AMBULATORY_CARE_PROVIDER_SITE_OTHER): Payer: Commercial Managed Care - HMO | Admitting: *Deleted

## 2016-04-27 DIAGNOSIS — I442 Atrioventricular block, complete: Secondary | ICD-10-CM

## 2016-04-27 NOTE — Progress Notes (Signed)
Remote pacemaker transmission.   

## 2016-04-28 ENCOUNTER — Encounter: Payer: Self-pay | Admitting: Cardiology

## 2016-05-26 LAB — CUP PACEART REMOTE DEVICE CHECK
Battery Voltage: 3.01 V
Brady Statistic AP VP Percent: 38 %
Brady Statistic AS VP Percent: 62 %
Brady Statistic AS VS Percent: 1 %
Brady Statistic RV Percent Paced: 99 %
Implantable Lead Implant Date: 20160114
Implantable Lead Implant Date: 20160114
Implantable Lead Location: 753859
Implantable Pulse Generator Implant Date: 20160114
Lead Channel Impedance Value: 660 Ohm
Lead Channel Pacing Threshold Amplitude: 0.5 V
Lead Channel Pacing Threshold Amplitude: 0.75 V
Lead Channel Pacing Threshold Pulse Width: 0.4 ms
Lead Channel Sensing Intrinsic Amplitude: 12 mV
Lead Channel Setting Pacing Amplitude: 1 V
Lead Channel Setting Pacing Amplitude: 1.5 V
Lead Channel Setting Pacing Pulse Width: 0.4 ms
Lead Channel Setting Sensing Sensitivity: 4 mV
MDC IDC LEAD LOCATION: 753860
MDC IDC MSMT BATTERY REMAINING LONGEVITY: 131 mo
MDC IDC MSMT BATTERY REMAINING PERCENTAGE: 95.5 %
MDC IDC MSMT LEADCHNL RA IMPEDANCE VALUE: 410 Ohm
MDC IDC MSMT LEADCHNL RA PACING THRESHOLD PULSEWIDTH: 0.4 ms
MDC IDC MSMT LEADCHNL RA SENSING INTR AMPL: 4.3 mV
MDC IDC PG SERIAL: 7706772
MDC IDC SESS DTM: 20171018133549
MDC IDC STAT BRADY AP VS PERCENT: 1 %
MDC IDC STAT BRADY RA PERCENT PACED: 37 %

## 2016-05-31 DIAGNOSIS — E785 Hyperlipidemia, unspecified: Secondary | ICD-10-CM | POA: Diagnosis not present

## 2016-05-31 DIAGNOSIS — I459 Conduction disorder, unspecified: Secondary | ICD-10-CM | POA: Diagnosis not present

## 2016-05-31 DIAGNOSIS — I10 Essential (primary) hypertension: Secondary | ICD-10-CM | POA: Diagnosis not present

## 2016-05-31 DIAGNOSIS — R7303 Prediabetes: Secondary | ICD-10-CM | POA: Diagnosis not present

## 2016-05-31 DIAGNOSIS — Z950 Presence of cardiac pacemaker: Secondary | ICD-10-CM | POA: Diagnosis not present

## 2016-07-28 ENCOUNTER — Encounter: Payer: Commercial Managed Care - HMO | Admitting: Internal Medicine

## 2016-08-12 ENCOUNTER — Ambulatory Visit (INDEPENDENT_AMBULATORY_CARE_PROVIDER_SITE_OTHER): Payer: Medicare HMO | Admitting: Internal Medicine

## 2016-08-12 ENCOUNTER — Encounter: Payer: Self-pay | Admitting: Internal Medicine

## 2016-08-12 VITALS — BP 186/90 | HR 87 | Ht 64.0 in | Wt 194.6 lb

## 2016-08-12 DIAGNOSIS — I442 Atrioventricular block, complete: Secondary | ICD-10-CM | POA: Diagnosis not present

## 2016-08-12 DIAGNOSIS — Z950 Presence of cardiac pacemaker: Secondary | ICD-10-CM

## 2016-08-12 LAB — CUP PACEART INCLINIC DEVICE CHECK
Brady Statistic RA Percent Paced: 32 %
Brady Statistic RV Percent Paced: 99.98 %
Implantable Lead Implant Date: 20160114
Lead Channel Impedance Value: 462.5 Ohm
Lead Channel Impedance Value: 775 Ohm
Lead Channel Pacing Threshold Pulse Width: 0.4 ms
Lead Channel Sensing Intrinsic Amplitude: 4.5 mV
MDC IDC LEAD IMPLANT DT: 20160114
MDC IDC LEAD LOCATION: 753859
MDC IDC LEAD LOCATION: 753860
MDC IDC MSMT BATTERY VOLTAGE: 3.01 V
MDC IDC MSMT LEADCHNL RA PACING THRESHOLD AMPLITUDE: 0.5 V
MDC IDC MSMT LEADCHNL RV PACING THRESHOLD AMPLITUDE: 0.75 V
MDC IDC MSMT LEADCHNL RV PACING THRESHOLD PULSEWIDTH: 0.4 ms
MDC IDC PG IMPLANT DT: 20160114
MDC IDC PG SERIAL: 7706772
MDC IDC SESS DTM: 20180202144150
MDC IDC SET LEADCHNL RA PACING AMPLITUDE: 1.5 V
MDC IDC SET LEADCHNL RV PACING AMPLITUDE: 1 V
MDC IDC SET LEADCHNL RV PACING PULSEWIDTH: 0.4 ms
MDC IDC SET LEADCHNL RV SENSING SENSITIVITY: 4 mV

## 2016-08-12 NOTE — Progress Notes (Signed)
HPI Elaine Ford returns today for followup. She is a very pleasant 71 year old woman with a history of hypertension and complete heart block, status post permanent pacemaker insertion. She denies chest pain or shortness of breath otherwise. No syncope. Her blood pressure has been a little high at home but she also has white coat syndrome. Her husband states that her pressures are in the 120--140 range at home. She c/o numbness in the fingers of her right hand which has been present since her cardiac cath which was performed from the right radial approach.  Allergies  Allergen Reactions  . Ace Inhibitors Cough  . Codeine Other (See Comments)    Reaction:  Unknown   . Hydrocodone-Acetaminophen Other (See Comments)    Reaction:  Unknown   . Lisinopril-Hydrochlorothiazide Cough  . Oxycodone-Acetaminophen Nausea And Vomiting     Current Outpatient Prescriptions  Medication Sig Dispense Refill  . Calcium Carbonate-Vitamin D (CALCIUM 600+D) 600-400 MG-UNIT tablet Take 1 tablet by mouth daily.    . carvedilol (COREG) 25 MG tablet Take 1 tablet (25 mg total) by mouth 2 (two) times daily. 180 tablet 3  . Coenzyme Q10 (COQ-10 PO) Take 1 capsule by mouth daily.     Marland Kitchen docusate sodium (COLACE) 100 MG capsule Take 1 capsule (100 mg total) by mouth 2 (two) times daily. 30 capsule 0  . ibuprofen (ADVIL,MOTRIN) 600 MG tablet Take 1 tablet (600 mg total) by mouth every 6 (six) hours as needed for moderate pain. 30 tablet 0  . Multiple Vitamin (MULTIVITAMIN WITH MINERALS) TABS tablet Take 1 tablet by mouth daily.    Marland Kitchen oxyCODONE-acetaminophen (PERCOCET/ROXICET) 5-325 MG tablet Take 1 tablet by mouth every 6 (six) hours as needed (moderate to severe pain (when tolerating fluids)). 30 tablet 0  . VITAMIN E PO Take 1 capsule by mouth daily.      No current facility-administered medications for this visit.      Past Medical History:  Diagnosis Date  . Back pain   . Broken arm    age 83 years old  broke right arm after a fall  . Gall bladder stones   . Gestational diabetes   . Hemorrhoid   . Hx of cardiac cath    [facility] 07/24/14 at [facility] - no significant CAD  . Hx of cardiovascular stress test    Myoview 11/14:  No ischemia  . Hx of echocardiogram    Echo 07/23/14 - at [facility]:  EF 65-70%, impaired relaxation, mild LAE, mild MR, mild AI, mild elevated PASP (44.2 mmHg)  . Hyperlipidemia   . Hypertension   . LBBB (left bundle branch block)   . Low blood pressure   . Nerve damage    right arm  . Pain    Left upper chest s/p pacer maker insertion site 07/2014  . S/P cardiac pacemaker procedure 07/2014   St. Jude - Dr. Lovena Le  . Stroke [facility])    patient was unaware noted prior to pacermaker placement unknown when it occurred    ROS:   All systems reviewed and negative except as noted in the HPI.   Past Surgical History:  Procedure Laterality Date  . COLONOSCOPY    . DILATATION & CURETTAGE/HYSTEROSCOPY WITH MYOSURE N/A 02/29/2016   Procedure: DILATATION & CURETTAGE/HYSTEROSCOPY WITH MYOSURE;  Surgeon: [name], DO;  Location: [place] [facility];  Service: Gynecology;  Laterality: N/A;  patient has a pacemaker   . GALLBLADDER SURGERY    . LAPAROSCOPIC BILATERAL SALPINGO OOPHERECTOMY  04/20/2016   Procedure: LAPAROSCOPIC BILATERAL SALPINGO OOPHORECTOMY;  Surgeon: [name], DO;  Location: [place] [facility];  Service: Gynecology;;  . LEAD REVISION N/A 07/25/2014   Procedure: LEAD REVISION;  Surgeon: Deboraha Sprang, MD;  Location: [facility] C[facility] LAB;  Service: Cardiovascular;  Laterality: N/A;  . PERMANENT PACEMAKER INSERTION N/A 07/24/2014   Procedure: PERMANENT PACEMAKER INSERTION;  Surgeon: [name], MD;  [facility] C[facility] LAB;  Service: Cardiovascular;  Laterality: N/A;  . VAGINAL HYSTERECTOMY  04/20/2016   Procedure: HYSTERECTOMY VAGINAL;  Surgeon: [name], DO;  Location: [place] [facility];  Service: Gynecology;;     Family History  Problem Relation Age of Onset  . Heart attack Father   . Stroke  Father   . Heart disease Brother      Social History   Social History  . Marital status: Married    Spouse name: N/A  . Number of children: N/A  . Years of education: N/A   Occupational History  . Not on file.   Social History Main Topics  . Smoking status: Never Smoker  . Smokeless tobacco: Never Used  . Alcohol use No  . Drug use: No  . Sexual activity: Yes    Birth control/ protection: Post-menopausal   Other Topics Concern  . Not on file   Social History Narrative  . No narrative on file     BP (!) 186/90   Pulse 87   Ht 5\' 4"  (1.626 m)   Wt 194 lb 9.6 oz (88.3 kg)   SpO2 98%   BMI 33.40 kg/m  204/92 by me Physical Exam:  Well appearing 71 year old woman, NAD HEENT: Unremarkable Neck:  7 cm JVD, no thyromegally Back:  No CVA tenderness Lungs:  Clear, with no wheezes, rales, or rhonchi. Pacemaker incision is well healed HEART:  Regular rate rhythm, no murmurs, no rubs, no clicks, soft S4 Abd:  soft, positive bowel sounds, no organomegally, no rebound, no guarding Ext:  2 plus pulses, trace peripheral edema, no cyanosis, no clubbing Skin:  No rashes no nodules Neuro:  CN II through XII intact, motor grossly intact  ECG  - NSR with ventricular pacing  DEVICE  Normal device function.  See PaceArt for details.   Assess/Plan: 1. CHB - she is doing well, s/p PPM insertion 2. PPM - her St. Jude device is working normally. Will recheck in several months. 3. HTN heart disease - she has a component of white count HTN. She will continue her current meds. Mikle Bosworth.D.

## 2016-08-12 NOTE — Patient Instructions (Signed)
Medication Instructions:  Your physician recommends that you continue on your current medications as directed. Please refer to the Current Medication list given to you today.   Labwork: None Ordered   Testing/Procedures: None Ordered   Follow-Up: Your physician wants you to follow-up in: 1 year with Dr. Lovena Le. You will receive a reminder letter in the mail two months in advance. If you don't receive a letter, please call our office to schedule the follow-up appointment.  Remote monitoring is used to monitor your Pacemaker from home. This monitoring reduces the number of office visits required to check your device to one time per year. It allows [place] to keep an eye on the functioning of your device to ensure it is working properly. You are scheduled for a device check from home on 11/14/16. You may send your transmission at any time that day. If you have a wireless device, the transmission will be sent automatically. After your physician reviews your transmission, you will receive a postcard with your next transmission date.    Any Other Special Instructions Will Be Listed Below (If Applicable).     If you need a refill on your cardiac medications before your next appointment, please call your pharmacy.

## 2016-08-31 ENCOUNTER — Other Ambulatory Visit: Payer: Self-pay | Admitting: Internal Medicine

## 2016-08-31 DIAGNOSIS — I442 Atrioventricular block, complete: Secondary | ICD-10-CM

## 2016-11-14 ENCOUNTER — Ambulatory Visit (INDEPENDENT_AMBULATORY_CARE_PROVIDER_SITE_OTHER): Payer: Medicare HMO | Admitting: *Deleted

## 2016-11-14 DIAGNOSIS — I442 Atrioventricular block, complete: Secondary | ICD-10-CM | POA: Diagnosis not present

## 2016-11-15 NOTE — Progress Notes (Signed)
Remote pacemaker transmission.   

## 2016-11-16 LAB — CUP PACEART REMOTE DEVICE CHECK
Battery Remaining Longevity: 133 mo
Battery Remaining Percentage: 95.5 %
Brady Statistic AP VS Percent: 1 %
Brady Statistic AS VS Percent: 1 %
Implantable Lead Implant Date: 20160114
Implantable Lead Location: 753859
Lead Channel Impedance Value: 460 Ohm
Lead Channel Pacing Threshold Amplitude: 0.625 V
Lead Channel Pacing Threshold Pulse Width: 0.4 ms
Lead Channel Sensing Intrinsic Amplitude: 12 mV
Lead Channel Sensing Intrinsic Amplitude: 5 mV
Lead Channel Setting Pacing Amplitude: 1.375
Lead Channel Setting Pacing Pulse Width: 0.4 ms
MDC IDC LEAD IMPLANT DT: 20160114
MDC IDC LEAD LOCATION: 753860
MDC IDC MSMT BATTERY VOLTAGE: 3.01 V
MDC IDC MSMT LEADCHNL RA PACING THRESHOLD AMPLITUDE: 0.375 V
MDC IDC MSMT LEADCHNL RA PACING THRESHOLD PULSEWIDTH: 0.4 ms
MDC IDC MSMT LEADCHNL RV IMPEDANCE VALUE: 680 Ohm
MDC IDC PG IMPLANT DT: 20160114
MDC IDC SESS DTM: 20180507060017
MDC IDC SET LEADCHNL RV PACING AMPLITUDE: 0.875
MDC IDC SET LEADCHNL RV SENSING SENSITIVITY: 4 mV
MDC IDC STAT BRADY AP VP PERCENT: 40 %
MDC IDC STAT BRADY AS VP PERCENT: 60 %
MDC IDC STAT BRADY RA PERCENT PACED: 40 %
MDC IDC STAT BRADY RV PERCENT PACED: 99 %
Pulse Gen Model: 2240
Pulse Gen Serial Number: 7706772

## 2016-11-18 ENCOUNTER — Encounter: Payer: Self-pay | Admitting: Cardiology

## 2017-01-26 DIAGNOSIS — Z01411 Encounter for gynecological examination (general) (routine) with abnormal findings: Secondary | ICD-10-CM | POA: Diagnosis not present

## 2017-01-26 DIAGNOSIS — L80 Vitiligo: Secondary | ICD-10-CM | POA: Diagnosis not present

## 2017-02-13 ENCOUNTER — Ambulatory Visit (INDEPENDENT_AMBULATORY_CARE_PROVIDER_SITE_OTHER): Payer: Medicare HMO | Admitting: *Deleted

## 2017-02-13 DIAGNOSIS — I442 Atrioventricular block, complete: Secondary | ICD-10-CM | POA: Diagnosis not present

## 2017-02-14 NOTE — Progress Notes (Signed)
Remote pacemaker transmission.   

## 2017-02-15 ENCOUNTER — Encounter: Payer: Self-pay | Admitting: Cardiology

## 2017-02-17 LAB — CUP PACEART REMOTE DEVICE CHECK
Battery Remaining Longevity: 133 mo
Battery Remaining Percentage: 95.5 %
Battery Voltage: 3.01 V
Brady Statistic AP VP Percent: 38 %
Brady Statistic AS VP Percent: 61 %
Brady Statistic RA Percent Paced: 38 %
Implantable Lead Implant Date: 20160114
Implantable Lead Location: 753859
Implantable Lead Location: 753860
Lead Channel Impedance Value: 490 Ohm
Lead Channel Pacing Threshold Amplitude: 0.5 V
Lead Channel Pacing Threshold Pulse Width: 0.4 ms
Lead Channel Sensing Intrinsic Amplitude: 12 mV
Lead Channel Sensing Intrinsic Amplitude: 4.9 mV
Lead Channel Setting Pacing Amplitude: 1.5 V
MDC IDC LEAD IMPLANT DT: 20160114
MDC IDC MSMT LEADCHNL RV IMPEDANCE VALUE: 700 Ohm
MDC IDC MSMT LEADCHNL RV PACING THRESHOLD AMPLITUDE: 0.625 V
MDC IDC MSMT LEADCHNL RV PACING THRESHOLD PULSEWIDTH: 0.4 ms
MDC IDC PG IMPLANT DT: 20160114
MDC IDC SESS DTM: 20180807074719
MDC IDC SET LEADCHNL RV PACING AMPLITUDE: 0.875
MDC IDC SET LEADCHNL RV PACING PULSEWIDTH: 0.4 ms
MDC IDC SET LEADCHNL RV SENSING SENSITIVITY: 4 mV
MDC IDC STAT BRADY AP VS PERCENT: 1 %
MDC IDC STAT BRADY AS VS PERCENT: 1 %
MDC IDC STAT BRADY RV PERCENT PACED: 99 %
Pulse Gen Model: 2240
Pulse Gen Serial Number: 7706772

## 2017-02-23 DIAGNOSIS — E669 Obesity, unspecified: Secondary | ICD-10-CM | POA: Diagnosis not present

## 2017-02-23 DIAGNOSIS — Z0000 Encounter for general adult medical examination without abnormal findings: Secondary | ICD-10-CM | POA: Diagnosis not present

## 2017-02-23 DIAGNOSIS — I459 Conduction disorder, unspecified: Secondary | ICD-10-CM | POA: Diagnosis not present

## 2017-02-23 DIAGNOSIS — E785 Hyperlipidemia, unspecified: Secondary | ICD-10-CM | POA: Diagnosis not present

## 2017-02-23 DIAGNOSIS — Z1389 Encounter for screening for other disorder: Secondary | ICD-10-CM | POA: Diagnosis not present

## 2017-02-23 DIAGNOSIS — I10 Essential (primary) hypertension: Secondary | ICD-10-CM | POA: Diagnosis not present

## 2017-02-23 DIAGNOSIS — Z950 Presence of cardiac pacemaker: Secondary | ICD-10-CM | POA: Diagnosis not present

## 2017-02-23 DIAGNOSIS — L309 Dermatitis, unspecified: Secondary | ICD-10-CM | POA: Diagnosis not present

## 2017-05-15 ENCOUNTER — Ambulatory Visit (INDEPENDENT_AMBULATORY_CARE_PROVIDER_SITE_OTHER): Payer: Medicare HMO | Admitting: *Deleted

## 2017-05-15 DIAGNOSIS — I442 Atrioventricular block, complete: Secondary | ICD-10-CM

## 2017-05-15 NOTE — Progress Notes (Signed)
Remote pacemaker transmission.   

## 2017-05-17 ENCOUNTER — Encounter: Payer: Self-pay | Admitting: Cardiology

## 2017-05-30 LAB — CUP PACEART REMOTE DEVICE CHECK
Implantable Lead Implant Date: 20160114
Implantable Lead Location: 753860
Implantable Pulse Generator Implant Date: 20160114
Lead Channel Setting Pacing Amplitude: 1.5 V
MDC IDC LEAD IMPLANT DT: 20160114
MDC IDC LEAD LOCATION: 753859
MDC IDC PG SERIAL: 7706772
MDC IDC SESS DTM: 20181120163308
MDC IDC SET LEADCHNL RV PACING AMPLITUDE: 0.875
MDC IDC SET LEADCHNL RV PACING PULSEWIDTH: 0.4 ms
MDC IDC SET LEADCHNL RV SENSING SENSITIVITY: 4 mV

## 2017-08-14 ENCOUNTER — Ambulatory Visit (INDEPENDENT_AMBULATORY_CARE_PROVIDER_SITE_OTHER): Payer: Medicare HMO | Admitting: *Deleted

## 2017-08-14 DIAGNOSIS — I442 Atrioventricular block, complete: Secondary | ICD-10-CM | POA: Diagnosis not present

## 2017-08-14 NOTE — Progress Notes (Signed)
Remote pacemaker transmission.   

## 2017-08-15 ENCOUNTER — Encounter: Payer: Self-pay | Admitting: Cardiology

## 2017-08-23 ENCOUNTER — Ambulatory Visit: Payer: Medicare HMO | Admitting: Internal Medicine

## 2017-08-23 ENCOUNTER — Encounter: Payer: Self-pay | Admitting: Internal Medicine

## 2017-08-23 VITALS — BP 190/88 | HR 76 | Ht 64.0 in | Wt 198.4 lb

## 2017-08-23 DIAGNOSIS — I10 Essential (primary) hypertension: Secondary | ICD-10-CM | POA: Diagnosis not present

## 2017-08-23 DIAGNOSIS — I442 Atrioventricular block, complete: Secondary | ICD-10-CM

## 2017-08-23 DIAGNOSIS — Z950 Presence of cardiac pacemaker: Secondary | ICD-10-CM | POA: Diagnosis not present

## 2017-08-23 NOTE — Progress Notes (Signed)
HPI [name] returns today for ongoing evaluation and management of intermittent CHB, s/p PPM insertion. She has done well in the interim. She denies chest pain or sob. No syncope. She has traveled to [place] and [place]. The patient has known white coat hypertension and has blood pressures at home in the 120-140 range.  Allergies  Allergen Reactions  . Ace Inhibitors Cough  . Codeine Other (See Comments)    Reaction:  Unknown   . Hydrocodone-Acetaminophen Other (See Comments)    Reaction:  Unknown   . Lisinopril-Hydrochlorothiazide Cough  . Oxycodone-Acetaminophen Nausea And Vomiting     Current Outpatient Medications  Medication Sig Dispense Refill  . Calcium Carbonate-Vitamin D (CALCIUM 600+D) 600-400 MG-UNIT tablet Take 1 tablet by mouth daily.    . carvedilol (COREG) 25 MG tablet TAKE 1 TABLET (25 MG TOTAL) BY MOUTH 2 (TWO) TIMES DAILY. 180 tablet 3  . ibuprofen (ADVIL,MOTRIN) 600 MG tablet Take 1 tablet (600 mg total) by mouth every 6 (six) hours as needed for moderate pain. 30 tablet 0  . Multiple Vitamin (MULTIVITAMIN WITH MINERALS) TABS tablet Take 1 tablet by mouth daily.    Marland Kitchen VITAMIN E PO Take 1 capsule by mouth daily.      No current facility-administered medications for this visit.      Past Medical History:  Diagnosis Date  . Back pain   . Broken arm    age 72 years old broke right arm after a fall  . Gall bladder stones   . Gestational diabetes   . Hemorrhoid   . Hx of cardiac cath    [facility] 07[facility]14 at [facility] - no significant CAD  . Hx of cardiovascular stress test    Myoview 11/14:  No ischemia  . Hx of echocardiogram    Echo 07/23/14 - at [facility]:  EF 65-70%, impaired relaxation, mild LAE, mild MR, mild AI, mild elevated PASP (44.2 mmHg)  . Hyperlipidemia   . Hypertension   . LBBB (left bundle branch block)   . Low blood pressure   . Nerve damage    right arm  . Pain    Left upper chest s/p pacer maker insertion site 07/2014  . S/P cardiac  pacemaker procedure 07/2014   St. Jude - Dr. Lovena Le  . Stroke [facility])    patient was unaware noted prior to pacermaker placement unknown when it occurred    ROS:   All systems reviewed and negative except as noted in the HPI.   Past Surgical History:  Procedure Laterality Date  . COLONOSCOPY    . DILATATION & CURETTAGE/HYSTEROSCOPY WITH MYOSURE N/A 02/29/2016   Procedure: DILATATION & CURETTAGE/HYSTEROSCOPY WITH MYOSURE;  Surgeon: [name], DO;  Location: [place] [facility];  Service: Gynecology;  Laterality: N/A;  patient has a pacemaker   . GALLBLADDER SURGERY    . LAPAROSCOPIC BILATERAL SALPINGO OOPHERECTOMY  04/20/2016   Procedure: LAPAROSCOPIC BILATERAL SALPINGO OOPHORECTOMY;  Surgeon: [name], DO;  Location: [place] [facility];  Service: Gynecology;;  . LEAD REVISION N/A 07/25/2014   Procedure: LEAD REVISION;  Surgeon: Deboraha Sprang, MD;  Location: [facility] CATH LAB;  Service: Cardiovascular;  Laterality: N/A;  . PERMANENT PACEMAKER INSERTION N/A 07/24/2014   Procedure: PERMANENT PACEMAKER INSERTION;  Surgeon: [name], MD;  Location: [facility] CATH LAB;  Service: Cardiovascular;  Laterality: N/A;  . VAGINAL HYSTERECTOMY  04/20/2016   Procedure: HYSTERECTOMY VAGINAL;  Surgeon: [name], DO;  Location: [place] [facility];  Service: Gynecology;;  Family History  Problem Relation Age of Onset  . Heart attack Father   . Stroke Father   . Heart disease Brother      Social History   Socioeconomic History  . Marital status: Married    Spouse name: Not on file  . Number of children: Not on file  . Years of education: Not on file  . Highest education level: Not on file  Social Needs  . Financial resource strain: Not on file  . Food insecurity - worry: Not on file  . Food insecurity - inability: Not on file  . Transportation needs - medical: Not on file  . Transportation needs - non-medical: Not on file  Occupational History  . Not on file  Tobacco Use  . Smoking status: Never Smoker  .  Smokeless tobacco: Never Used  Substance and Sexual Activity  . Alcohol use: No  . Drug use: No  . Sexual activity: Yes    Birth control/protection: Post-menopausal  Other Topics Concern  . Not on file  Social History Narrative  . Not on file     BP (!) 190/88   Pulse 76   Ht 5\' 4"  (1.626 m)   Wt 198 lb 6.4 oz (90 kg)   SpO2 97%   BMI 34.06 kg/m   Physical Exam:  Well appearing 72 yo woman, NAD HEENT: Unremarkable Neck:  6 cm JVD, no thyromegally Lymphatics:  No adenopathy Back:  No CVA tenderness Lungs:  Clear with no wheezes HEART:  Regular rate rhythm, no murmurs, no rubs, no clicks, soft S4. Abd:  soft, positive bowel sounds, no organomegally, no rebound, no guarding Ext:  2 plus pulses, no edema, no cyanosis, no clubbing Skin:  No rashes no nodules Neuro:  CN II through XII intact, motor grossly intact  EKG- NSR with LBBB  DEVICE  Normal device function.  See PaceArt for details.   Assess/Plan: 1. HTN - her pressure is high again today but she notes that at home she is essentially normotensive. I have asked her to watch this and to call her if her pressures increase. 2. CHB - she is conducting today and feels well. She has had her PPM reprogrammed to allow for her intrinsic conduction to come through. 3. PPM - her St. Jude DDD PM is working normally. We reprogrammed her device today to allow for intrinsic conduction.  4. Obesity - she has not lost any weight in the past year. I have encouraged her to do so and to increase her physical activity.   Mikle Bosworth.D.

## 2017-08-23 NOTE — Patient Instructions (Addendum)
Medication Instructions:  Your physician recommends that you continue on your current medications as directed. Please refer to the Current Medication list given to you today.  Labwork: None ordered.  Testing/Procedures: None ordered.  Follow-Up: Your physician wants you to follow-up in: one year with Dr. Lovena Le.   You will receive a reminder letter in the mail two months in advance. If you don't receive a letter, please call our office to schedule the follow-up appointment.  Remote monitoring is used to monitor your ICD from home. This monitoring reduces the number of office visits required to check your device to one time per year. It allows [place] to keep an eye on the functioning of your device to ensure it is working properly. You are scheduled for a device check from home on 11/13/2017. You may send your transmission at any time that day. If you have a wireless device, the transmission will be sent automatically. After your physician reviews your transmission, you will receive a postcard with your next transmission date.  Any Other Special Instructions Will Be Listed Below (If Applicable).  If you need a refill on your cardiac medications before your next appointment, please call your pharmacy.

## 2017-08-25 LAB — CUP PACEART INCLINIC DEVICE CHECK
Battery Remaining Longevity: 135 mo
Battery Voltage: 2.99 V
Implantable Lead Implant Date: 20160114
Implantable Lead Implant Date: 20160114
Implantable Lead Location: 753859
Implantable Pulse Generator Implant Date: 20160114
Lead Channel Impedance Value: 525 Ohm
Lead Channel Pacing Threshold Amplitude: 0.5 V
Lead Channel Pacing Threshold Pulse Width: 0.4 ms
Lead Channel Setting Pacing Amplitude: 0.75 V
Lead Channel Setting Pacing Amplitude: 1.5 V
Lead Channel Setting Pacing Pulse Width: 0.4 ms
Lead Channel Setting Sensing Sensitivity: 4 mV
MDC IDC LEAD LOCATION: 753860
MDC IDC MSMT LEADCHNL RA PACING THRESHOLD AMPLITUDE: 0.5 V
MDC IDC MSMT LEADCHNL RA PACING THRESHOLD PULSEWIDTH: 0.4 ms
MDC IDC MSMT LEADCHNL RA SENSING INTR AMPL: 5 mV
MDC IDC MSMT LEADCHNL RV IMPEDANCE VALUE: 775 Ohm
MDC IDC MSMT LEADCHNL RV SENSING INTR AMPL: 12 mV
MDC IDC PG SERIAL: 7706772
MDC IDC SESS DTM: 20190213223241
MDC IDC STAT BRADY RA PERCENT PACED: 45 %
MDC IDC STAT BRADY RV PERCENT PACED: 83 %

## 2017-08-28 NOTE — Addendum Note (Signed)
Addended by: Patterson Hammersmith A on: 08/28/2017 02:36 PM   Modules accepted: Orders

## 2017-09-01 LAB — CUP PACEART REMOTE DEVICE CHECK
Implantable Lead Implant Date: 20160114
Implantable Lead Implant Date: 20160114
Implantable Lead Location: 753859
Implantable Pulse Generator Implant Date: 20160114
MDC IDC LEAD LOCATION: 753860
MDC IDC PG SERIAL: 7706772
MDC IDC SESS DTM: 20190222135109
Pulse Gen Model: 2240

## 2017-11-13 ENCOUNTER — Ambulatory Visit (INDEPENDENT_AMBULATORY_CARE_PROVIDER_SITE_OTHER): Payer: Medicare HMO | Admitting: *Deleted

## 2017-11-13 DIAGNOSIS — I442 Atrioventricular block, complete: Secondary | ICD-10-CM

## 2017-11-13 NOTE — Progress Notes (Signed)
Remote pacemaker transmission.   

## 2017-11-15 ENCOUNTER — Encounter: Payer: Self-pay | Admitting: Cardiology

## 2017-11-27 LAB — CUP PACEART REMOTE DEVICE CHECK
Battery Remaining Longevity: 134 mo
Battery Voltage: 3.01 V
Brady Statistic AP VP Percent: 32 %
Brady Statistic AS VP Percent: 34 %
Brady Statistic AS VS Percent: 13 %
Implantable Lead Implant Date: 20160114
Implantable Lead Location: 753860
Lead Channel Impedance Value: 730 Ohm
Lead Channel Pacing Threshold Pulse Width: 0.4 ms
Lead Channel Setting Pacing Amplitude: 1.5 V
Lead Channel Setting Sensing Sensitivity: 4 mV
MDC IDC LEAD IMPLANT DT: 20160114
MDC IDC LEAD LOCATION: 753859
MDC IDC MSMT BATTERY REMAINING PERCENTAGE: 95.5 %
MDC IDC MSMT LEADCHNL RA IMPEDANCE VALUE: 530 Ohm
MDC IDC MSMT LEADCHNL RA PACING THRESHOLD AMPLITUDE: 0.5 V
MDC IDC MSMT LEADCHNL RA PACING THRESHOLD PULSEWIDTH: 0.4 ms
MDC IDC MSMT LEADCHNL RA SENSING INTR AMPL: 5 mV
MDC IDC MSMT LEADCHNL RV PACING THRESHOLD AMPLITUDE: 0.75 V
MDC IDC MSMT LEADCHNL RV SENSING INTR AMPL: 12 mV
MDC IDC PG IMPLANT DT: 20160114
MDC IDC PG SERIAL: 7706772
MDC IDC SESS DTM: 20190506114643
MDC IDC SET LEADCHNL RV PACING AMPLITUDE: 1 V
MDC IDC SET LEADCHNL RV PACING PULSEWIDTH: 0.4 ms
MDC IDC STAT BRADY AP VS PERCENT: 22 %
MDC IDC STAT BRADY RA PERCENT PACED: 53 %
MDC IDC STAT BRADY RV PERCENT PACED: 65 %

## 2018-02-08 DIAGNOSIS — L80 Vitiligo: Secondary | ICD-10-CM | POA: Diagnosis not present

## 2018-02-12 ENCOUNTER — Encounter: Payer: Medicare HMO | Admitting: *Deleted

## 2018-02-14 ENCOUNTER — Telehealth: Payer: Self-pay

## 2018-02-14 NOTE — Telephone Encounter (Signed)
LMOVM reminding pt to send remote transmission.   

## 2018-02-16 ENCOUNTER — Ambulatory Visit (INDEPENDENT_AMBULATORY_CARE_PROVIDER_SITE_OTHER): Payer: Medicare HMO | Admitting: *Deleted

## 2018-02-16 DIAGNOSIS — I442 Atrioventricular block, complete: Secondary | ICD-10-CM

## 2018-02-16 NOTE — Progress Notes (Signed)
Remote pacemaker transmission.   

## 2018-03-01 DIAGNOSIS — R7309 Other abnormal glucose: Secondary | ICD-10-CM | POA: Diagnosis not present

## 2018-03-01 DIAGNOSIS — E785 Hyperlipidemia, unspecified: Secondary | ICD-10-CM | POA: Diagnosis not present

## 2018-03-01 DIAGNOSIS — Z1389 Encounter for screening for other disorder: Secondary | ICD-10-CM | POA: Diagnosis not present

## 2018-03-01 DIAGNOSIS — I10 Essential (primary) hypertension: Secondary | ICD-10-CM | POA: Diagnosis not present

## 2018-03-01 DIAGNOSIS — Z950 Presence of cardiac pacemaker: Secondary | ICD-10-CM | POA: Diagnosis not present

## 2018-03-01 DIAGNOSIS — Z0000 Encounter for general adult medical examination without abnormal findings: Secondary | ICD-10-CM | POA: Diagnosis not present

## 2018-03-01 DIAGNOSIS — Z1159 Encounter for screening for other viral diseases: Secondary | ICD-10-CM | POA: Diagnosis not present

## 2018-03-01 DIAGNOSIS — I459 Conduction disorder, unspecified: Secondary | ICD-10-CM | POA: Diagnosis not present

## 2018-03-08 LAB — CUP PACEART REMOTE DEVICE CHECK
Battery Remaining Percentage: 95.5 %
Battery Voltage: 3.01 V
Brady Statistic AS VS Percent: 7.9 %
Brady Statistic RA Percent Paced: 51 %
Brady Statistic RV Percent Paced: 80 %
Date Time Interrogation Session: 20190809061458
Implantable Lead Implant Date: 20160114
Implantable Lead Location: 753860
Implantable Pulse Generator Implant Date: 20160114
Lead Channel Pacing Threshold Amplitude: 0.5 V
Lead Channel Pacing Threshold Pulse Width: 0.4 ms
Lead Channel Pacing Threshold Pulse Width: 0.4 ms
Lead Channel Sensing Intrinsic Amplitude: 3.9 mV
Lead Channel Setting Pacing Amplitude: 0.875
Lead Channel Setting Pacing Amplitude: 1.5 V
Lead Channel Setting Sensing Sensitivity: 4 mV
MDC IDC LEAD IMPLANT DT: 20160114
MDC IDC LEAD LOCATION: 753859
MDC IDC MSMT BATTERY REMAINING LONGEVITY: 134 mo
MDC IDC MSMT LEADCHNL RA IMPEDANCE VALUE: 480 Ohm
MDC IDC MSMT LEADCHNL RV IMPEDANCE VALUE: 780 Ohm
MDC IDC MSMT LEADCHNL RV PACING THRESHOLD AMPLITUDE: 0.625 V
MDC IDC MSMT LEADCHNL RV SENSING INTR AMPL: 12 mV
MDC IDC PG SERIAL: 7706772
MDC IDC SET LEADCHNL RV PACING PULSEWIDTH: 0.4 ms
MDC IDC STAT BRADY AP VP PERCENT: 40 %
MDC IDC STAT BRADY AP VS PERCENT: 12 %
MDC IDC STAT BRADY AS VP PERCENT: 40 %
Pulse Gen Model: 2240

## 2018-03-29 DIAGNOSIS — M8588 Other specified disorders of bone density and structure, other site: Secondary | ICD-10-CM | POA: Diagnosis not present

## 2018-05-21 ENCOUNTER — Ambulatory Visit (INDEPENDENT_AMBULATORY_CARE_PROVIDER_SITE_OTHER): Payer: Medicare HMO | Admitting: *Deleted

## 2018-05-21 DIAGNOSIS — I442 Atrioventricular block, complete: Secondary | ICD-10-CM

## 2018-05-21 DIAGNOSIS — I10 Essential (primary) hypertension: Secondary | ICD-10-CM

## 2018-05-21 NOTE — Progress Notes (Signed)
Remote pacemaker transmission.   

## 2018-07-22 LAB — CUP PACEART REMOTE DEVICE CHECK
Battery Remaining Percentage: 95.5 %
Brady Statistic AP VP Percent: 37 %
Brady Statistic AP VS Percent: 13 %
Brady Statistic RV Percent Paced: 77 %
Date Time Interrogation Session: 20191111070012
Implantable Lead Implant Date: 20160114
Implantable Lead Location: 753859
Implantable Lead Location: 753860
Implantable Pulse Generator Implant Date: 20160114
Lead Channel Impedance Value: 790 Ohm
Lead Channel Pacing Threshold Amplitude: 0.625 V
Lead Channel Pacing Threshold Pulse Width: 0.4 ms
Lead Channel Sensing Intrinsic Amplitude: 11.2 mV
Lead Channel Sensing Intrinsic Amplitude: 4.5 mV
Lead Channel Setting Pacing Amplitude: 0.875
Lead Channel Setting Pacing Amplitude: 1.5 V
Lead Channel Setting Pacing Pulse Width: 0.4 ms
Lead Channel Setting Sensing Sensitivity: 4 mV
MDC IDC LEAD IMPLANT DT: 20160114
MDC IDC MSMT BATTERY REMAINING LONGEVITY: 133 mo
MDC IDC MSMT BATTERY VOLTAGE: 2.99 V
MDC IDC MSMT LEADCHNL RA IMPEDANCE VALUE: 490 Ohm
MDC IDC MSMT LEADCHNL RA PACING THRESHOLD AMPLITUDE: 0.5 V
MDC IDC MSMT LEADCHNL RA PACING THRESHOLD PULSEWIDTH: 0.4 ms
MDC IDC STAT BRADY AS VP PERCENT: 40 %
MDC IDC STAT BRADY AS VS PERCENT: 9.9 %
MDC IDC STAT BRADY RA PERCENT PACED: 50 %
Pulse Gen Model: 2240
Pulse Gen Serial Number: 7706772

## 2018-08-20 ENCOUNTER — Ambulatory Visit (INDEPENDENT_AMBULATORY_CARE_PROVIDER_SITE_OTHER): Payer: Medicare HMO

## 2018-08-20 DIAGNOSIS — R001 Bradycardia, unspecified: Secondary | ICD-10-CM

## 2018-08-20 DIAGNOSIS — I442 Atrioventricular block, complete: Secondary | ICD-10-CM

## 2018-08-22 LAB — CUP PACEART REMOTE DEVICE CHECK
Battery Remaining Longevity: 134 mo
Battery Remaining Percentage: 95.5 %
Battery Voltage: 2.99 V
Brady Statistic AP VS Percent: 15 %
Brady Statistic AS VS Percent: 13 %
Brady Statistic RV Percent Paced: 72 %
Date Time Interrogation Session: 20200211065024
Implantable Lead Implant Date: 20160114
Lead Channel Impedance Value: 480 Ohm
Lead Channel Pacing Threshold Amplitude: 0.5 V
Lead Channel Pacing Threshold Pulse Width: 0.4 ms
Lead Channel Sensing Intrinsic Amplitude: 4.9 mV
Lead Channel Setting Sensing Sensitivity: 4 mV
MDC IDC LEAD IMPLANT DT: 20160114
MDC IDC LEAD LOCATION: 753859
MDC IDC LEAD LOCATION: 753860
MDC IDC MSMT LEADCHNL RA PACING THRESHOLD PULSEWIDTH: 0.4 ms
MDC IDC MSMT LEADCHNL RV IMPEDANCE VALUE: 690 Ohm
MDC IDC MSMT LEADCHNL RV PACING THRESHOLD AMPLITUDE: 0.625 V
MDC IDC MSMT LEADCHNL RV SENSING INTR AMPL: 12 mV
MDC IDC PG IMPLANT DT: 20160114
MDC IDC SET LEADCHNL RA PACING AMPLITUDE: 1.5 V
MDC IDC SET LEADCHNL RV PACING AMPLITUDE: 0.875
MDC IDC SET LEADCHNL RV PACING PULSEWIDTH: 0.4 ms
MDC IDC STAT BRADY AP VP PERCENT: 35 %
MDC IDC STAT BRADY AS VP PERCENT: 37 %
MDC IDC STAT BRADY RA PERCENT PACED: 49 %
Pulse Gen Model: 2240
Pulse Gen Serial Number: 7706772

## 2018-08-31 NOTE — Progress Notes (Signed)
Remote pacemaker transmission.   

## 2018-09-07 DIAGNOSIS — E785 Hyperlipidemia, unspecified: Secondary | ICD-10-CM | POA: Diagnosis not present

## 2018-10-26 ENCOUNTER — Telehealth: Payer: Self-pay | Admitting: Internal Medicine

## 2018-10-26 NOTE — Telephone Encounter (Signed)
New message   Pt set up for doxemity call with Dr. Lovena Le on 04.22.20. Pt husband will call back with cell phone number.

## 2018-10-26 NOTE — Telephone Encounter (Signed)
New message   [facility] for pt to call back about appt on 04.22.20 with Dr. Lovena Le. Will offer doxemity video if pt has a smart phone.

## 2018-10-26 NOTE — Telephone Encounter (Signed)
Patient's spouse called back, he stated yes she would be interested in a video visit.

## 2018-10-30 NOTE — Telephone Encounter (Signed)
Verbal consent for virtual visit obtained.

## 2018-10-30 NOTE — Telephone Encounter (Signed)
Spouse returned call with mobile number. [phone]

## 2018-10-30 NOTE — Telephone Encounter (Signed)
No further action required

## 2018-10-31 ENCOUNTER — Other Ambulatory Visit: Payer: Self-pay

## 2018-10-31 ENCOUNTER — Telehealth (INDEPENDENT_AMBULATORY_CARE_PROVIDER_SITE_OTHER): Payer: Medicare HMO | Admitting: Internal Medicine

## 2018-10-31 DIAGNOSIS — Z950 Presence of cardiac pacemaker: Secondary | ICD-10-CM

## 2018-10-31 DIAGNOSIS — I442 Atrioventricular block, complete: Secondary | ICD-10-CM | POA: Diagnosis not present

## 2018-10-31 DIAGNOSIS — I10 Essential (primary) hypertension: Secondary | ICD-10-CM | POA: Diagnosis not present

## 2018-10-31 NOTE — Progress Notes (Signed)
Electrophysiology TeleHealth Note   Due to national recommendations of social distancing due to COVID 19, an audio/video telehealth visit is felt to be most appropriate for this patient at this time.  See MyChart message from today for the patient's consent to telehealth for [facility].   Date:  10/31/2018   ID:  Elaine Ford, DOB 13-Nov-1945, MRN [id number]  Location: patient's home  Provider location: [address], [place]  Evaluation Performed: Follow-up visit  PCP:  [name], MD  Cardiologist:  No primary care provider on file.  Electrophysiologist:  Dr Lovena Le  Chief Complaint:  "I been doing ok"  History of Present Illness:    Elaine Ford is a 73 y.o. female who presents via audio/video conferencing for a telehealth visit today.  She has a h/o CHB, s/p PPM insertion, as well as HTN. Since last being seen in our clinic, the patient reports doing very well.  Today, she denies symptoms of palpitations, chest pain, shortness of breath,  lower extremity edema, dizziness, presyncope, or syncope.  The patient is otherwise without complaint today.  The patient denies symptoms of fevers, chills, cough, or new SOB worrisome for COVID 19.  Past Medical History:  Diagnosis Date  . Back pain   . Broken arm    age 49 years old broke right arm after a fall  . Gall bladder stones   . Gestational diabetes   . Hemorrhoid   . Hx of cardiac cath    [facility] 07/24/14 at [facility] - no significant CAD  . Hx of cardiovascular stress test    Myoview 11/14:  No ischemia  . Hx of echocardiogram    Echo 07/23/14 - at [facility]:  EF 65-70%, impaired relaxation, mild LAE, mild MR, mild AI, mild elevated PASP (44.2 mmHg)  . Hyperlipidemia   . Hypertension   . LBBB (left bundle branch block)   . Low blood pressure   . Nerve damage    right arm  . Pain    Left upper chest s/p pacer maker insertion site 07/2014  . S/P cardiac pacemaker procedure 07/2014   St. Jude - Dr. Lovena Le  .  Stroke [facility])    patient was unaware noted prior to pacermaker placement unknown when it occurred    Past Surgical History:  Procedure Laterality Date  . COLONOSCOPY    . DILATATION & CURETTAGE/HYSTEROSCOPY WITH MYOSURE N/A 02/29/2016   Procedure: DILATATION & CURETTAGE/HYSTEROSCOPY WITH MYOSURE;  Surgeon: [name], DO;  Location: [place] [facility];  Service: Gynecology;  Laterality: N/A;  patient has a pacemaker   . GALLBLADDER SURGERY    . LAPAROSCOPIC BILATERAL SALPINGO OOPHERECTOMY  04/20/2016   Procedure: LAPAROSCOPIC BILATERAL SALPINGO OOPHORECTOMY;  Surgeon: [name], DO;  Location: [place] [facility];  Service: Gynecology;;  . LEAD REVISION N/A 07/25/2014   Procedure: LEAD REVISION;  Surgeon: Deboraha Sprang, MD;  Location[facility] CATH LAB;  Service: Cardiovascular;  Laterality: N/A;  . PERMANENT PACEMAKER INSERTION N/A 07/24/2014   Procedure: PERMANENT PACEMAKER INSERTION;  Surgeon: [name], MD;  Location: [facility] C[facility] LAB;  Service: Cardiovascular;  Laterality: N/A;  . VAGINAL HYSTERECTOMY  04/20/2016   Procedure: HYSTERECTOMY VAGINAL;  Surgeon: [name], DO;  Location: [place] [facility];  Service: Gynecology;;    Current Outpatient Medications  Medication Sig Dispense Refill  . Calcium Carbonate-Vitamin D (CALCIUM 600+D) 600-400 MG-UNIT tablet Take 1 tablet by mouth daily.    . carvedilol (COREG) 25 MG tablet TAKE 1 TABLET (25  MG TOTAL) BY MOUTH 2 (TWO) TIMES DAILY. 180 tablet 3  . ibuprofen (ADVIL,MOTRIN) 600 MG tablet Take 1 tablet (600 mg total) by mouth every 6 (six) hours as needed for moderate pain. 30 tablet 0  . Multiple Vitamin (MULTIVITAMIN WITH MINERALS) TABS tablet Take 1 tablet by mouth daily.    Marland Kitchen VITAMIN E PO Take 1 capsule by mouth daily.      No current facility-administered medications for this visit.     Allergies:   Ace inhibitors; Codeine; Hydrocodone-acetaminophen; Lisinopril-hydrochlorothiazide; and Oxycodone-acetaminophen   Social History:  The patient  reports that she has  never smoked. She has never used smokeless tobacco. She reports that she does not drink alcohol or use drugs.   Family History:  The patient's  family history includes Heart attack in her father; Heart disease in her brother; Stroke in her father.   ROS:  Please see the history of present illness.   All other systems are personally reviewed and negative.    Exam:    Vital Signs:  There were no vitals taken for this visit.  Well appearing, alert and conversant, regular work of breathing,  good skin color Eyes- anicteric, neuro- grossly intact, skin- no apparent rash or lesions or cyanosis, mouth- oral mucosa is pink   Labs/Other Tests and Data Reviewed:    Recent Labs: No results found for requested labs within last 8760 hours.   Wt Readings from Last 3 Encounters:  08/23/17 198 lb 6.4 oz (90 kg)  08/12/16 194 lb 9.6 oz (88.3 kg)  04/21/16 192 lb 8 oz (87.3 kg)     Other studies personally reviewed: Last device remote is reviewed from [place] PDF dated 08/21/18 which reveals normal device function, no arrhythmias    ASSESSMENT & PLAN:    1.  CHB - she is asymptomatic, s/p PPM insertion 2. PPM - her device was last checked in 08/22/18 and is working normally.  3. COVID 19 screen The patient denies symptoms of COVID 19 at this time.  The importance of social distancing was discussed today.  Follow-up:  1 year Next remote: 5/20  Current medicines are reviewed at length with the patient today.   The patient does not have concerns regarding her medicines.  The following changes were made today:  none  Labs/ tests ordered today include:  No orders of the defined types were placed in this encounter.    Patient Risk:  after full review of this patients clinical status, I feel that they are at moderate risk at this time.  Today, I have spent 15 minutes with the patient with telehealth technology discussing the above.    Signed, Cristopher Peru, MD  10/31/2018 2:44 PM     [facility] [address] [place] [address] [phone] (office) [phone] (fax)

## 2018-11-19 ENCOUNTER — Ambulatory Visit (INDEPENDENT_AMBULATORY_CARE_PROVIDER_SITE_OTHER): Payer: Medicare HMO | Admitting: *Deleted

## 2018-11-19 ENCOUNTER — Other Ambulatory Visit: Payer: Self-pay

## 2018-11-19 DIAGNOSIS — I442 Atrioventricular block, complete: Secondary | ICD-10-CM

## 2018-11-19 DIAGNOSIS — R001 Bradycardia, unspecified: Secondary | ICD-10-CM

## 2018-11-20 LAB — CUP PACEART REMOTE DEVICE CHECK
Date Time Interrogation Session: 20200512114858
Implantable Lead Implant Date: 20160114
Implantable Lead Implant Date: 20160114
Implantable Lead Location: 753859
Implantable Lead Location: 753860
Implantable Pulse Generator Implant Date: 20160114
Pulse Gen Model: 2240
Pulse Gen Serial Number: 7706772

## 2018-11-28 NOTE — Progress Notes (Signed)
Remote pacemaker transmission.   

## 2019-02-19 ENCOUNTER — Ambulatory Visit (INDEPENDENT_AMBULATORY_CARE_PROVIDER_SITE_OTHER): Payer: Medicare HMO | Admitting: *Deleted

## 2019-02-19 DIAGNOSIS — I442 Atrioventricular block, complete: Secondary | ICD-10-CM

## 2019-02-19 LAB — CUP PACEART REMOTE DEVICE CHECK
Battery Remaining Longevity: 136 mo
Battery Remaining Percentage: 95.5 %
Battery Voltage: 2.99 V
Brady Statistic AP VP Percent: 32 %
Brady Statistic AP VS Percent: 16 %
Brady Statistic AS VP Percent: 39 %
Brady Statistic AS VS Percent: 14 %
Brady Statistic RA Percent Paced: 48 %
Brady Statistic RV Percent Paced: 71 %
Date Time Interrogation Session: 20200810060015
Implantable Lead Implant Date: 20160114
Implantable Lead Implant Date: 20160114
Implantable Lead Location: 753859
Implantable Lead Location: 753860
Implantable Pulse Generator Implant Date: 20160114
Lead Channel Impedance Value: 490 Ohm
Lead Channel Impedance Value: 800 Ohm
Lead Channel Pacing Threshold Amplitude: 0.5 V
Lead Channel Pacing Threshold Amplitude: 0.5 V
Lead Channel Pacing Threshold Pulse Width: 0.4 ms
Lead Channel Pacing Threshold Pulse Width: 0.4 ms
Lead Channel Sensing Intrinsic Amplitude: 12 mV
Lead Channel Sensing Intrinsic Amplitude: 3.8 mV
Lead Channel Setting Pacing Amplitude: 0.75 V
Lead Channel Setting Pacing Amplitude: 1.5 V
Lead Channel Setting Pacing Pulse Width: 0.4 ms
Lead Channel Setting Sensing Sensitivity: 4 mV
Pulse Gen Model: 2240
Pulse Gen Serial Number: 7706772

## 2019-02-28 ENCOUNTER — Encounter: Payer: Self-pay | Admitting: Cardiology

## 2019-02-28 NOTE — Progress Notes (Signed)
Remote pacemaker transmission.   

## 2019-03-20 DIAGNOSIS — Z1389 Encounter for screening for other disorder: Secondary | ICD-10-CM | POA: Diagnosis not present

## 2019-03-20 DIAGNOSIS — I10 Essential (primary) hypertension: Secondary | ICD-10-CM | POA: Diagnosis not present

## 2019-03-20 DIAGNOSIS — Z0000 Encounter for general adult medical examination without abnormal findings: Secondary | ICD-10-CM | POA: Diagnosis not present

## 2019-03-20 DIAGNOSIS — R7303 Prediabetes: Secondary | ICD-10-CM | POA: Diagnosis not present

## 2019-03-20 DIAGNOSIS — E785 Hyperlipidemia, unspecified: Secondary | ICD-10-CM | POA: Diagnosis not present

## 2019-03-20 DIAGNOSIS — I459 Conduction disorder, unspecified: Secondary | ICD-10-CM | POA: Diagnosis not present

## 2019-03-20 DIAGNOSIS — Z950 Presence of cardiac pacemaker: Secondary | ICD-10-CM | POA: Diagnosis not present

## 2019-03-20 DIAGNOSIS — E669 Obesity, unspecified: Secondary | ICD-10-CM | POA: Diagnosis not present

## 2019-03-27 DIAGNOSIS — E785 Hyperlipidemia, unspecified: Secondary | ICD-10-CM | POA: Diagnosis not present

## 2019-03-27 DIAGNOSIS — I10 Essential (primary) hypertension: Secondary | ICD-10-CM | POA: Diagnosis not present

## 2019-03-27 DIAGNOSIS — R7303 Prediabetes: Secondary | ICD-10-CM | POA: Diagnosis not present

## 2019-05-21 ENCOUNTER — Ambulatory Visit (INDEPENDENT_AMBULATORY_CARE_PROVIDER_SITE_OTHER): Payer: Medicare HMO | Admitting: *Deleted

## 2019-05-21 DIAGNOSIS — I442 Atrioventricular block, complete: Secondary | ICD-10-CM | POA: Diagnosis not present

## 2019-05-21 DIAGNOSIS — I447 Left bundle-branch block, unspecified: Secondary | ICD-10-CM

## 2019-05-21 LAB — CUP PACEART REMOTE DEVICE CHECK
Battery Remaining Longevity: 133 mo
Battery Remaining Percentage: 95.5 %
Battery Voltage: 2.99 V
Brady Statistic AP VP Percent: 33 %
Brady Statistic AP VS Percent: 14 %
Brady Statistic AS VP Percent: 41 %
Brady Statistic AS VS Percent: 13 %
Brady Statistic RA Percent Paced: 47 %
Brady Statistic RV Percent Paced: 73 %
Date Time Interrogation Session: 20201109113629
Implantable Lead Implant Date: 20160114
Implantable Lead Implant Date: 20160114
Implantable Lead Location: 753859
Implantable Lead Location: 753860
Implantable Pulse Generator Implant Date: 20160114
Lead Channel Impedance Value: 450 Ohm
Lead Channel Impedance Value: 690 Ohm
Lead Channel Pacing Threshold Amplitude: 0.5 V
Lead Channel Pacing Threshold Amplitude: 0.75 V
Lead Channel Pacing Threshold Pulse Width: 0.4 ms
Lead Channel Pacing Threshold Pulse Width: 0.4 ms
Lead Channel Sensing Intrinsic Amplitude: 12 mV
Lead Channel Sensing Intrinsic Amplitude: 3.3 mV
Lead Channel Setting Pacing Amplitude: 1 V
Lead Channel Setting Pacing Amplitude: 1.5 V
Lead Channel Setting Pacing Pulse Width: 0.4 ms
Lead Channel Setting Sensing Sensitivity: 4 mV
Pulse Gen Model: 2240
Pulse Gen Serial Number: 7706772

## 2019-05-31 DIAGNOSIS — J209 Acute bronchitis, unspecified: Secondary | ICD-10-CM | POA: Diagnosis not present

## 2019-05-31 DIAGNOSIS — R05 Cough: Secondary | ICD-10-CM | POA: Diagnosis not present

## 2019-05-31 DIAGNOSIS — B349 Viral infection, unspecified: Secondary | ICD-10-CM | POA: Diagnosis not present

## 2019-06-10 NOTE — Progress Notes (Signed)
Remote pacemaker transmission.   

## 2019-08-20 ENCOUNTER — Ambulatory Visit (INDEPENDENT_AMBULATORY_CARE_PROVIDER_SITE_OTHER): Payer: Medicare HMO | Admitting: *Deleted

## 2019-08-20 DIAGNOSIS — I442 Atrioventricular block, complete: Secondary | ICD-10-CM | POA: Diagnosis not present

## 2019-08-21 LAB — CUP PACEART REMOTE DEVICE CHECK
Battery Remaining Longevity: 133 mo
Battery Remaining Longevity: 133 mo
Battery Remaining Percentage: 95.5 %
Battery Remaining Percentage: 95.5 %
Battery Voltage: 2.99 V
Battery Voltage: 2.99 V
Brady Statistic AP VP Percent: 33 %
Brady Statistic AP VP Percent: 33 %
Brady Statistic AP VS Percent: 13 %
Brady Statistic AP VS Percent: 13 %
Brady Statistic AS VP Percent: 43 %
Brady Statistic AS VP Percent: 43 %
Brady Statistic AS VS Percent: 12 %
Brady Statistic AS VS Percent: 12 %
Brady Statistic RA Percent Paced: 46 %
Brady Statistic RA Percent Paced: 46 %
Brady Statistic RV Percent Paced: 75 %
Brady Statistic RV Percent Paced: 75 %
Date Time Interrogation Session: 20210208020013
Date Time Interrogation Session: 20210208020013
Implantable Lead Implant Date: 20160114
Implantable Lead Implant Date: 20160114
Implantable Lead Implant Date: 20160114
Implantable Lead Implant Date: 20160114
Implantable Lead Location: 753859
Implantable Lead Location: 753859
Implantable Lead Location: 753860
Implantable Lead Location: 753860
Implantable Pulse Generator Implant Date: 20160114
Implantable Pulse Generator Implant Date: 20160114
Lead Channel Impedance Value: 460 Ohm
Lead Channel Impedance Value: 460 Ohm
Lead Channel Impedance Value: 730 Ohm
Lead Channel Impedance Value: 730 Ohm
Lead Channel Pacing Threshold Amplitude: 0.5 V
Lead Channel Pacing Threshold Amplitude: 0.5 V
Lead Channel Pacing Threshold Amplitude: 0.625 V
Lead Channel Pacing Threshold Amplitude: 0.625 V
Lead Channel Pacing Threshold Pulse Width: 0.4 ms
Lead Channel Pacing Threshold Pulse Width: 0.4 ms
Lead Channel Pacing Threshold Pulse Width: 0.4 ms
Lead Channel Pacing Threshold Pulse Width: 0.4 ms
Lead Channel Sensing Intrinsic Amplitude: 12 mV
Lead Channel Sensing Intrinsic Amplitude: 12 mV
Lead Channel Sensing Intrinsic Amplitude: 3.8 mV
Lead Channel Sensing Intrinsic Amplitude: 3.8 mV
Lead Channel Setting Pacing Amplitude: 0.875
Lead Channel Setting Pacing Amplitude: 0.875
Lead Channel Setting Pacing Amplitude: 1.5 V
Lead Channel Setting Pacing Amplitude: 1.5 V
Lead Channel Setting Pacing Pulse Width: 0.4 ms
Lead Channel Setting Pacing Pulse Width: 0.4 ms
Lead Channel Setting Sensing Sensitivity: 4 mV
Lead Channel Setting Sensing Sensitivity: 4 mV
Pulse Gen Model: 2240
Pulse Gen Model: 2240
Pulse Gen Serial Number: 7706772
Pulse Gen Serial Number: 7706772

## 2019-08-21 NOTE — Progress Notes (Signed)
PPM Remote  

## 2019-09-03 DIAGNOSIS — L650 Telogen effluvium: Secondary | ICD-10-CM | POA: Diagnosis not present

## 2019-09-03 DIAGNOSIS — L820 Inflamed seborrheic keratosis: Secondary | ICD-10-CM | POA: Diagnosis not present

## 2019-09-03 DIAGNOSIS — L821 Other seborrheic keratosis: Secondary | ICD-10-CM | POA: Diagnosis not present

## 2019-09-16 DIAGNOSIS — M8588 Other specified disorders of bone density and structure, other site: Secondary | ICD-10-CM | POA: Diagnosis not present

## 2019-09-16 DIAGNOSIS — I459 Conduction disorder, unspecified: Secondary | ICD-10-CM | POA: Diagnosis not present

## 2019-09-16 DIAGNOSIS — L659 Nonscarring hair loss, unspecified: Secondary | ICD-10-CM | POA: Diagnosis not present

## 2019-09-16 DIAGNOSIS — I10 Essential (primary) hypertension: Secondary | ICD-10-CM | POA: Diagnosis not present

## 2019-09-16 DIAGNOSIS — Z950 Presence of cardiac pacemaker: Secondary | ICD-10-CM | POA: Diagnosis not present

## 2019-09-16 DIAGNOSIS — R7303 Prediabetes: Secondary | ICD-10-CM | POA: Diagnosis not present

## 2019-09-16 DIAGNOSIS — E785 Hyperlipidemia, unspecified: Secondary | ICD-10-CM | POA: Diagnosis not present

## 2019-09-16 DIAGNOSIS — E669 Obesity, unspecified: Secondary | ICD-10-CM | POA: Diagnosis not present

## 2019-11-18 LAB — CUP PACEART REMOTE DEVICE CHECK
Battery Remaining Longevity: 133 mo
Battery Remaining Percentage: 95.5 %
Battery Voltage: 2.98 V
Brady Statistic AP VP Percent: 35 %
Brady Statistic AP VS Percent: 12 %
Brady Statistic AS VP Percent: 43 %
Brady Statistic AS VS Percent: 10 %
Brady Statistic RA Percent Paced: 47 %
Brady Statistic RV Percent Paced: 78 %
Date Time Interrogation Session: 20210510020015
Implantable Lead Implant Date: 20160114
Implantable Lead Implant Date: 20160114
Implantable Lead Location: 753859
Implantable Lead Location: 753860
Implantable Pulse Generator Implant Date: 20160114
Lead Channel Impedance Value: 490 Ohm
Lead Channel Impedance Value: 740 Ohm
Lead Channel Pacing Threshold Amplitude: 0.5 V
Lead Channel Pacing Threshold Amplitude: 0.625 V
Lead Channel Pacing Threshold Pulse Width: 0.4 ms
Lead Channel Pacing Threshold Pulse Width: 0.4 ms
Lead Channel Sensing Intrinsic Amplitude: 12 mV
Lead Channel Sensing Intrinsic Amplitude: 3.4 mV
Lead Channel Setting Pacing Amplitude: 0.875
Lead Channel Setting Pacing Amplitude: 1.5 V
Lead Channel Setting Pacing Pulse Width: 0.4 ms
Lead Channel Setting Sensing Sensitivity: 4 mV
Pulse Gen Model: 2240
Pulse Gen Serial Number: 7706772

## 2019-11-19 ENCOUNTER — Ambulatory Visit (INDEPENDENT_AMBULATORY_CARE_PROVIDER_SITE_OTHER): Payer: Medicare HMO | Admitting: *Deleted

## 2019-11-19 DIAGNOSIS — I442 Atrioventricular block, complete: Secondary | ICD-10-CM | POA: Diagnosis not present

## 2019-11-21 NOTE — Progress Notes (Signed)
Remote pacemaker transmission.   

## 2020-02-11 DIAGNOSIS — I10 Essential (primary) hypertension: Secondary | ICD-10-CM | POA: Diagnosis not present

## 2020-02-11 DIAGNOSIS — L80 Vitiligo: Secondary | ICD-10-CM | POA: Diagnosis not present

## 2020-02-11 DIAGNOSIS — L309 Dermatitis, unspecified: Secondary | ICD-10-CM | POA: Diagnosis not present

## 2020-02-18 ENCOUNTER — Ambulatory Visit (INDEPENDENT_AMBULATORY_CARE_PROVIDER_SITE_OTHER): Payer: Medicare HMO | Admitting: *Deleted

## 2020-02-18 DIAGNOSIS — I442 Atrioventricular block, complete: Secondary | ICD-10-CM | POA: Diagnosis not present

## 2020-02-18 LAB — CUP PACEART REMOTE DEVICE CHECK
Battery Remaining Longevity: 132 mo
Battery Remaining Percentage: 95.5 %
Battery Voltage: 2.98 V
Brady Statistic AP VP Percent: 36 %
Brady Statistic AP VS Percent: 10 %
Brady Statistic AS VP Percent: 44 %
Brady Statistic AS VS Percent: 9.4 %
Brady Statistic RA Percent Paced: 47 %
Brady Statistic RV Percent Paced: 80 %
Date Time Interrogation Session: 20210809020014
Implantable Lead Implant Date: 20160114
Implantable Lead Implant Date: 20160114
Implantable Lead Location: 753859
Implantable Lead Location: 753860
Implantable Pulse Generator Implant Date: 20160114
Lead Channel Impedance Value: 490 Ohm
Lead Channel Impedance Value: 690 Ohm
Lead Channel Pacing Threshold Amplitude: 0.5 V
Lead Channel Pacing Threshold Amplitude: 0.75 V
Lead Channel Pacing Threshold Pulse Width: 0.4 ms
Lead Channel Pacing Threshold Pulse Width: 0.4 ms
Lead Channel Sensing Intrinsic Amplitude: 12 mV
Lead Channel Sensing Intrinsic Amplitude: 4.2 mV
Lead Channel Setting Pacing Amplitude: 1 V
Lead Channel Setting Pacing Amplitude: 1.5 V
Lead Channel Setting Pacing Pulse Width: 0.4 ms
Lead Channel Setting Sensing Sensitivity: 4 mV
Pulse Gen Model: 2240
Pulse Gen Serial Number: 7706772

## 2020-02-19 NOTE — Progress Notes (Signed)
Remote pacemaker transmission.   

## 2020-03-11 ENCOUNTER — Encounter: Payer: Self-pay | Admitting: Internal Medicine

## 2020-03-11 ENCOUNTER — Other Ambulatory Visit: Payer: Self-pay

## 2020-03-11 ENCOUNTER — Ambulatory Visit: Payer: Medicare HMO | Admitting: Internal Medicine

## 2020-03-11 VITALS — BP 190/94 | HR 64 | Ht 64.0 in

## 2020-03-11 DIAGNOSIS — I10 Essential (primary) hypertension: Secondary | ICD-10-CM | POA: Diagnosis not present

## 2020-03-11 DIAGNOSIS — I442 Atrioventricular block, complete: Secondary | ICD-10-CM

## 2020-03-11 DIAGNOSIS — Z950 Presence of cardiac pacemaker: Secondary | ICD-10-CM

## 2020-03-11 MED ORDER — NITROGLYCERIN 0.4 MG SL SUBL: 0.4000 mg | SUBLINGUAL_TABLET | SUBLINGUAL | 3 refills | Status: DC | PRN

## 2020-03-11 NOTE — Patient Instructions (Signed)
Medication Instructions:  Your physician recommends that you continue on your current medications as directed. Please refer to the Current Medication list given to you today.  Labwork: None ordered.  Testing/Procedures: None ordered.  Follow-Up: Your physician wants you to follow-up in: one year with Dr. Lovena Le.   You will receive a reminder letter in the mail two months in advance. If you don't receive a letter, please call our office to schedule the follow-up appointment.  Remote monitoring is used to monitor your Pacemaker from home. This monitoring reduces the number of office visits required to check your device to one time per year. It allows [place] to keep an eye on the functioning of your device to ensure it is working properly. You are scheduled for a device check from home on 05/19/2020. You may send your transmission at any time that day. If you have a wireless device, the transmission will be sent automatically. After your physician reviews your transmission, you will receive a postcard with your next transmission date.  Any Other Special Instructions Will Be Listed Below (If Applicable).  If you need a refill on your cardiac medications before your next appointment, please call your pharmacy.

## 2020-03-11 NOTE — Progress Notes (Signed)
HPI Elaine Ford returns today for ongoing followup. She is a pleasant 74 yo woman with CHB, s/p PPM insertion. She has obesity and HTN. She developed left arm swelling after her PPM, presumably due to venous thrombosis. She has not had syncope.  Allergies  Allergen Reactions   Ace Inhibitors Cough   Codeine Other (See Comments)    Reaction:  Unknown    Hydrocodone-Acetaminophen Other (See Comments)    Reaction:  Unknown    Lisinopril-Hydrochlorothiazide Cough   Oxycodone-Acetaminophen Nausea And Vomiting     Current Outpatient Medications  Medication Sig Dispense Refill   Calcium Carbonate-Vitamin D (CALCIUM 600+D) 600-400 MG-UNIT tablet Take 1 tablet by mouth daily.     carvedilol (COREG) 25 MG tablet TAKE 1 TABLET (25 MG TOTAL) BY MOUTH 2 (TWO) TIMES DAILY. 180 tablet 3   ibuprofen (ADVIL,MOTRIN) 600 MG tablet Take 1 tablet (600 mg total) by mouth every 6 (six) hours as needed for moderate pain. 30 tablet 0   Multiple Vitamin (MULTIVITAMIN WITH MINERALS) TABS tablet Take 1 tablet by mouth daily.     VITAMIN E PO Take 1 capsule by mouth daily.      nitroGLYCERIN (NITROSTAT) 0.4 MG SL tablet Place 1 tablet (0.4 mg total) under the tongue every 5 (five) minutes as needed for chest pain. 30 tablet 3   No current facility-administered medications for this visit.     Past Medical History:  Diagnosis Date   Back pain    Broken arm    age 52 years old broke right arm after a fall   Gall bladder stones    Gestational diabetes    Hemorrhoid    Hx of cardiac cath    [facility] 07/24/14 at [facility] - no significant CAD   Hx of cardiovascular stress test    Myoview 11/14:  No ischemia   Hx of echocardiogram    Echo 07/23/14 - at [facility]:  EF 65-70%, impaired relaxation, mild LAE, mild MR, mild AI, mild elevated PASP (44.2 mmHg)   Hyperlipidemia    Hypertension    LBBB (left bundle branch block)    Low blood pressure    Nerve damage    right arm   Pain    Left  upper chest s/p pacer maker insertion site 07/2014   S/P cardiac pacemaker procedure 07/2014   St. Jude - Dr. Lovena Le   Stroke [facility])    patient was unaware noted prior to pacermaker placement unknown when it occurred    ROS:   All systems reviewed and negative except as noted in the HPI.   Past Surgical History:  Procedure Laterality Date   COLONOSCOPY     DILATATION & CURETTAGE/HYSTEROSCOPY WITH MYOSURE N/A 02/29/2016   Procedure: DILATATION & CURETTAGE/HYSTEROSCOPY WITH MYOSURE;  Surgeon: [name], DO;  Location: [place] [facility];  Service: Gynecology;  Laterality: N/A;  patient has a pacemaker    [place]  04/20/2016   Procedure: LAPAROSCOPIC BILATERAL SALPINGO OOPHORECTOMY;  Surgeon: [name], DO;  Location: [place] [facility];  Service: Gynecology;;   LEAD REVISION N/A 07/25/2014   Procedure: LEAD REVISION;  Surgeon: Deboraha Sprang, MD;  Location: [facility] CATH LAB;  Service: Cardiovascular;  Laterality: N/A;   PERMANENT PACEMAKER INSERTION N/A 07/24/2014   Procedure: PERMANENT PACEMAKER INSERTION;  Surgeon: [name], MD;  Location: [facility] CATH LAB;  Service: Cardiovascular;  Laterality: N/A;   VAGINAL HYSTERECTOMY  04/20/2016   Procedure: HYSTERECTOMY VAGINAL;  Surgeon: [name], DO;  Location: [place] [facility];  Service: Gynecology;;     Family History  Problem Relation Age of Onset   Heart attack Father    Stroke Father    Heart disease Brother      Social History   Socioeconomic History   Marital status: Married    Spouse name: Not on file   Number of children: Not on file   Years of education: Not on file   Highest education level: Not on file  Occupational History   Not on file  Tobacco Use   Smoking status: Never Smoker   Smokeless tobacco: Never Used  Substance and Sexual Activity   Alcohol use: No   Drug use: No   Sexual activity: Yes    Birth control/protection: Post-menopausal  Other Topics  Concern   Not on file  Social History Narrative   Not on file   Social Determinants of Health   Financial Resource Strain:    Difficulty of Paying Living Expenses: Not on file  Food Insecurity:    Worried About [place] in the Last Year: Not on file   [employer] of Food in the Last Year: Not on file  Transportation Needs:    Lack of Transportation (Medical): Not on file   Lack of Transportation (Non-Medical): Not on file  Physical Activity:    Days of Exercise per Week: Not on file   Minutes of Exercise per Session: Not on file  Stress:    Feeling of Stress : Not on file  Social Connections:    Frequency of Communication with Friends and Family: Not on file   Frequency of Social Gatherings with Friends and Family: Not on file   Attends Religious Services: Not on file   Active Member of Clubs or Organizations: Not on file   Attends Archivist Meetings: Not on file   Marital Status: Not on file  Intimate Partner Violence:    Fear of Current or Ex-Partner: Not on file   Emotionally Abused: Not on file   Physically Abused: Not on file   Sexually Abused: Not on file     BP (!) 190/94    Pulse 64    Ht 5\' 4"  (1.626 m)    SpO2 93%    BMI 34.06 kg/m   Physical Exam:  Well appearing NAD HEENT: Unremarkable Neck:  No JVD, no thyromegally Lymphatics:  No adenopathy Back:  No CVA tenderness Lungs:  Clear with no wheezes HEART:  Regular rate rhythm, no murmurs, no rubs, no clicks Abd:  soft, positive bowel sounds, no organomegally, no rebound, no guarding Ext:  2 plus pulses, no edema, no cyanosis, no clubbing Skin:  No rashes no nodules Neuro:  CN II through XII intact, motor grossly intact  EKG - nsr with ventricular pacing  DEVICE  Normal device function.  See PaceArt for details.   Assess/Plan: 1. CHB - she is asymptomatic s/p PPM insertion. 2. PPM - her St. Jude DDD PM is working normally. 3. Left arm swelling - she still has a  little bit of swelling and tenderness in her left arm. I encouraged her to keep the arm elevated.  4. Obesity - she is encouraged to lose weight.   Carleene Overlie Toshiro Hanken,MD

## 2020-03-20 DIAGNOSIS — M8588 Other specified disorders of bone density and structure, other site: Secondary | ICD-10-CM | POA: Diagnosis not present

## 2020-03-20 DIAGNOSIS — E669 Obesity, unspecified: Secondary | ICD-10-CM | POA: Diagnosis not present

## 2020-03-20 DIAGNOSIS — I10 Essential (primary) hypertension: Secondary | ICD-10-CM | POA: Diagnosis not present

## 2020-03-20 DIAGNOSIS — Z0000 Encounter for general adult medical examination without abnormal findings: Secondary | ICD-10-CM | POA: Diagnosis not present

## 2020-03-20 DIAGNOSIS — I459 Conduction disorder, unspecified: Secondary | ICD-10-CM | POA: Diagnosis not present

## 2020-03-20 DIAGNOSIS — E7800 Pure hypercholesterolemia, unspecified: Secondary | ICD-10-CM | POA: Diagnosis not present

## 2020-03-20 DIAGNOSIS — R7303 Prediabetes: Secondary | ICD-10-CM | POA: Diagnosis not present

## 2020-03-20 DIAGNOSIS — Z950 Presence of cardiac pacemaker: Secondary | ICD-10-CM | POA: Diagnosis not present

## 2020-03-20 DIAGNOSIS — Z1389 Encounter for screening for other disorder: Secondary | ICD-10-CM | POA: Diagnosis not present

## 2020-05-19 ENCOUNTER — Ambulatory Visit (INDEPENDENT_AMBULATORY_CARE_PROVIDER_SITE_OTHER): Payer: Medicare HMO

## 2020-05-19 DIAGNOSIS — I442 Atrioventricular block, complete: Secondary | ICD-10-CM | POA: Diagnosis not present

## 2020-05-19 LAB — CUP PACEART REMOTE DEVICE CHECK
Battery Remaining Longevity: 131 mo
Battery Remaining Percentage: 95.5 %
Battery Voltage: 2.98 V
Brady Statistic AP VP Percent: 63 %
Brady Statistic AP VS Percent: 1 %
Brady Statistic AS VP Percent: 37 %
Brady Statistic AS VS Percent: 1 %
Brady Statistic RA Percent Paced: 63 %
Brady Statistic RV Percent Paced: 99 %
Date Time Interrogation Session: 20211108010013
Implantable Lead Implant Date: 20160114
Implantable Lead Implant Date: 20160114
Implantable Lead Location: 753859
Implantable Lead Location: 753860
Implantable Pulse Generator Implant Date: 20160114
Lead Channel Impedance Value: 510 Ohm
Lead Channel Impedance Value: 680 Ohm
Lead Channel Pacing Threshold Amplitude: 0.5 V
Lead Channel Pacing Threshold Amplitude: 0.75 V
Lead Channel Pacing Threshold Pulse Width: 0.4 ms
Lead Channel Pacing Threshold Pulse Width: 0.4 ms
Lead Channel Sensing Intrinsic Amplitude: 12 mV
Lead Channel Sensing Intrinsic Amplitude: 3.7 mV
Lead Channel Setting Pacing Amplitude: 1 V
Lead Channel Setting Pacing Amplitude: 1.5 V
Lead Channel Setting Pacing Pulse Width: 0.4 ms
Lead Channel Setting Sensing Sensitivity: 4 mV
Pulse Gen Model: 2240
Pulse Gen Serial Number: 7706772

## 2020-05-20 NOTE — Progress Notes (Signed)
Remote pacemaker transmission.   

## 2020-08-18 ENCOUNTER — Ambulatory Visit (INDEPENDENT_AMBULATORY_CARE_PROVIDER_SITE_OTHER): Payer: Medicare HMO

## 2020-08-18 DIAGNOSIS — I442 Atrioventricular block, complete: Secondary | ICD-10-CM

## 2020-08-18 LAB — CUP PACEART REMOTE DEVICE CHECK
Battery Remaining Longevity: 130 mo
Battery Remaining Percentage: 95.5 %
Battery Voltage: 2.98 V
Brady Statistic AP VP Percent: 63 %
Brady Statistic AP VS Percent: 1 %
Brady Statistic AS VP Percent: 36 %
Brady Statistic AS VS Percent: 1 %
Brady Statistic RA Percent Paced: 64 %
Brady Statistic RV Percent Paced: 99 %
Date Time Interrogation Session: 20220208022543
Implantable Lead Implant Date: 20160114
Implantable Lead Implant Date: 20160114
Implantable Lead Location: 753859
Implantable Lead Location: 753860
Implantable Pulse Generator Implant Date: 20160114
Lead Channel Impedance Value: 490 Ohm
Lead Channel Impedance Value: 660 Ohm
Lead Channel Pacing Threshold Amplitude: 0.625 V
Lead Channel Pacing Threshold Amplitude: 0.875 V
Lead Channel Pacing Threshold Pulse Width: 0.4 ms
Lead Channel Pacing Threshold Pulse Width: 0.4 ms
Lead Channel Sensing Intrinsic Amplitude: 12 mV
Lead Channel Sensing Intrinsic Amplitude: 3.7 mV
Lead Channel Setting Pacing Amplitude: 1.125
Lead Channel Setting Pacing Amplitude: 1.625
Lead Channel Setting Pacing Pulse Width: 0.4 ms
Lead Channel Setting Sensing Sensitivity: 4 mV
Pulse Gen Model: 2240
Pulse Gen Serial Number: 7706772

## 2020-08-24 NOTE — Progress Notes (Signed)
Remote pacemaker transmission.   

## 2020-11-17 ENCOUNTER — Ambulatory Visit (INDEPENDENT_AMBULATORY_CARE_PROVIDER_SITE_OTHER): Payer: Medicare HMO

## 2020-11-17 DIAGNOSIS — I10 Essential (primary) hypertension: Secondary | ICD-10-CM | POA: Diagnosis not present

## 2020-11-17 DIAGNOSIS — L219 Seborrheic dermatitis, unspecified: Secondary | ICD-10-CM | POA: Diagnosis not present

## 2020-11-17 DIAGNOSIS — M1990 Unspecified osteoarthritis, unspecified site: Secondary | ICD-10-CM | POA: Diagnosis not present

## 2020-11-17 DIAGNOSIS — R7303 Prediabetes: Secondary | ICD-10-CM | POA: Diagnosis not present

## 2020-11-17 DIAGNOSIS — E785 Hyperlipidemia, unspecified: Secondary | ICD-10-CM | POA: Diagnosis not present

## 2020-11-17 DIAGNOSIS — I442 Atrioventricular block, complete: Secondary | ICD-10-CM | POA: Diagnosis not present

## 2020-11-17 DIAGNOSIS — L309 Dermatitis, unspecified: Secondary | ICD-10-CM | POA: Diagnosis not present

## 2020-11-17 LAB — CUP PACEART REMOTE DEVICE CHECK
Battery Remaining Longevity: 132 mo
Battery Remaining Percentage: 95.5 %
Battery Voltage: 2.98 V
Brady Statistic AP VP Percent: 67 %
Brady Statistic AP VS Percent: 1 %
Brady Statistic AS VP Percent: 32 %
Brady Statistic AS VS Percent: 1 %
Brady Statistic RA Percent Paced: 68 %
Brady Statistic RV Percent Paced: 99 %
Date Time Interrogation Session: 20220509020012
Implantable Lead Implant Date: 20160114
Implantable Lead Implant Date: 20160114
Implantable Lead Location: 753859
Implantable Lead Location: 753860
Implantable Pulse Generator Implant Date: 20160114
Lead Channel Impedance Value: 530 Ohm
Lead Channel Impedance Value: 660 Ohm
Lead Channel Pacing Threshold Amplitude: 0.5 V
Lead Channel Pacing Threshold Amplitude: 0.875 V
Lead Channel Pacing Threshold Pulse Width: 0.4 ms
Lead Channel Pacing Threshold Pulse Width: 0.4 ms
Lead Channel Sensing Intrinsic Amplitude: 12 mV
Lead Channel Sensing Intrinsic Amplitude: 5 mV
Lead Channel Setting Pacing Amplitude: 1.125
Lead Channel Setting Pacing Amplitude: 1.5 V
Lead Channel Setting Pacing Pulse Width: 0.4 ms
Lead Channel Setting Sensing Sensitivity: 4 mV
Pulse Gen Model: 2240
Pulse Gen Serial Number: 7706772

## 2020-11-19 DIAGNOSIS — L400 Psoriasis vulgaris: Secondary | ICD-10-CM | POA: Diagnosis not present

## 2020-11-19 DIAGNOSIS — L821 Other seborrheic keratosis: Secondary | ICD-10-CM | POA: Diagnosis not present

## 2020-12-02 DIAGNOSIS — H524 Presbyopia: Secondary | ICD-10-CM | POA: Diagnosis not present

## 2020-12-09 NOTE — Progress Notes (Signed)
Remote pacemaker transmission.   

## 2020-12-22 DIAGNOSIS — Z0100 Encounter for examination of eyes and vision without abnormal findings: Secondary | ICD-10-CM | POA: Diagnosis not present

## 2021-01-25 ENCOUNTER — Ambulatory Visit (HOSPITAL_COMMUNITY)
Admission: RE | Admit: 2021-01-25 | Discharge: 2021-01-25 | Disposition: A | Discharge status: Home / Self Care | Payer: Medicare HMO | Source: Ambulatory Visit | Attending: Urgent Care | Admitting: Urgent Care

## 2021-01-25 ENCOUNTER — Encounter (HOSPITAL_COMMUNITY): Payer: Self-pay

## 2021-01-25 ENCOUNTER — Ambulatory Visit (INDEPENDENT_AMBULATORY_CARE_PROVIDER_SITE_OTHER): Payer: Medicare HMO

## 2021-01-25 ENCOUNTER — Other Ambulatory Visit: Payer: Self-pay

## 2021-01-25 VITALS — BP 195/70 | HR 67 | Temp 98.2°F | Resp 18

## 2021-01-25 DIAGNOSIS — M1712 Unilateral primary osteoarthritis, left knee: Secondary | ICD-10-CM

## 2021-01-25 DIAGNOSIS — I10 Essential (primary) hypertension: Secondary | ICD-10-CM

## 2021-01-25 DIAGNOSIS — M25562 Pain in left knee: Secondary | ICD-10-CM | POA: Diagnosis not present

## 2021-01-25 MED ORDER — TRAMADOL HCL 50 MG PO TABS: 50.0000 mg | ORAL_TABLET | Freq: Four times a day (QID) | ORAL | 0 refills | Status: DC | PRN

## 2021-01-25 NOTE — ED Provider Notes (Addendum)
[place]   MRN: [id number] DOB: 11/01/1945  Subjective:   Elaine Ford is a 75 y.o. female presenting for 1 week history of acute onset left sided knee pain, swelling. No trauma, falls, erythema, warmth. No history of arthritis. No hx of diabetes. Has pacemaker, hx of LBBB, complete heart block, HTN.  No headache, confusion, dizziness, numbness or tingling, weakness on one side of the body.  No history of stroke.  Reports compliance with her blood pressure medications.  She also follows up regularly with her heart doctor and regular doctor.  No current facility-administered medications for this encounter.  Current Outpatient Medications:    Calcium Carbonate-Vitamin D (CALCIUM 600+D) 600-400 MG-UNIT tablet, Take 1 tablet by mouth daily., Disp: , Rfl:    carvedilol (COREG) 25 MG tablet, TAKE 1 TABLET (25 MG TOTAL) BY MOUTH 2 (TWO) TIMES DAILY., Disp: 180 tablet, Rfl: 3   ibuprofen (ADVIL,MOTRIN) 600 MG tablet, Take 1 tablet (600 mg total) by mouth every 6 (six) hours as needed for moderate pain., Disp: 30 tablet, Rfl: 0   Multiple Vitamin (MULTIVITAMIN WITH MINERALS) TABS tablet, Take 1 tablet by mouth daily., Disp: , Rfl:    nitroGLYCERIN (NITROSTAT) 0.4 MG SL tablet, Place 1 tablet (0.4 mg total) under the tongue every 5 (five) minutes as needed for chest pain., Disp: 30 tablet, Rfl: 3   VITAMIN E PO, Take 1 capsule by mouth daily. , Disp: , Rfl:    Allergies  Allergen Reactions   Ace Inhibitors Cough   Codeine Other (See Comments)    Reaction:  Unknown    Hydrocodone-Acetaminophen Other (See Comments)    Reaction:  Unknown    Lisinopril-Hydrochlorothiazide Cough   Oxycodone-Acetaminophen Nausea And Vomiting    Past Medical History:  Diagnosis Date   Back pain    Broken arm    age 82 years old broke right arm after a fall   Gall bladder stones    Gestational diabetes    Hemorrhoid    Hx of cardiac cath    [facility] 07/24/14 at [facility] - no significant CAD   Hx  of cardiovascular stress test    Myoview 11/14:  No ischemia   Hx of echocardiogram    Echo 07/23/14 - at [facility]:  EF 65-70%, impaired relaxation, mild LAE, mild MR, mild AI, mild elevated PASP (44.2 mmHg)   Hyperlipidemia    Hypertension    LBBB (left bundle branch block)    Low blood pressure    Nerve damage    right arm   Pain    Left upper chest s/p pacer maker insertion site 07/2014   S/P cardiac pacemaker procedure 07/2014   St. Jude - Dr. Lovena Le   Stroke [facility])    patient was unaware noted prior to pacermaker placement unknown when it occurred     Past Surgical History:  Procedure Laterality Date   COLONOSCOPY     DILATATION & CURETTAGE/HYSTEROSCOPY WITH MYOSURE N/A 02/29/2016   Procedure: DILATATION & CURETTAGE/HYSTEROSCOPY WITH MYOSURE;  Surgeon: [name], DO;  Location: [place] [facility];  Service: Gynecology;  Laterality: N/A;  patient has a pacemaker    [place]  04/20/2016   Procedure: LAPAROSCOPIC BILATERAL SALPINGO OOPHORECTOMY;  Surgeon: [name], DO;  Location: [place] [facility];  Service: Gynecology;;   LEAD REVISION N/A 07/25/2014   Procedure: LEAD REVISION;  Surgeon: Deboraha Sprang, MD;  Location: [facility] CATH LAB;  Service: Cardiovascular;  Laterality:  N/A;   PERMANENT PACEMAKER INSERTION N/A 07/24/2014   Procedure: PERMANENT PACEMAKER INSERTION;  Surgeon: [name], MD;  Location: [facility] CATH LAB;  Service: Cardiovascular;  Laterality: N/A;   VAGINAL HYSTERECTOMY  04/20/2016   Procedure: HYSTERECTOMY VAGINAL;  Surgeon: [name], DO;  Location: [place] [facility];  Service: Gynecology;;    Family History  Problem Relation Age of Onset   Heart attack Father    Stroke Father    Heart disease Brother     Social History   Tobacco Use   Smoking status: Never   Smokeless tobacco: Never  Substance Use Topics   Alcohol use: No   Drug use: No    ROS   Objective:   Vitals: BP (!) 195/70 (BP Location: Right Arm)   Pulse  67   Temp 98.2 F (36.8 C) (Oral)   Resp 18   SpO2 96%    BP Readings from Last 3 Encounters:  01/25/21 (!) 195/70  03/11/20 (!) 190/94  08/23/17 (!) 190/88   Physical Exam Constitutional:      General: She is not in acute distress.    Appearance: Normal appearance. She is well-developed. She is obese. She is not ill-appearing, toxic-appearing or diaphoretic.  HENT:     Head: Normocephalic and atraumatic.     Nose: Nose normal.     Mouth/Throat:     Mouth: Mucous membranes are moist.     Pharynx: Oropharynx is clear.  Eyes:     General: No scleral icterus.    Extraocular Movements: Extraocular movements intact.     Pupils: Pupils are equal, round, and reactive to light.  Cardiovascular:     Rate and Rhythm: Normal rate.  Pulmonary:     Effort: Pulmonary effort is normal.  Musculoskeletal:     Left knee: No deformity, effusion, erythema, ecchymosis or lacerations. Decreased range of motion. Tenderness present over the medial joint line. No lateral joint line, MCL, LCL or patellar tendon tenderness.  Skin:    General: Skin is warm and dry.  Neurological:     General: No focal deficit present.     Mental Status: She is alert and oriented to person, place, and time.     Cranial Nerves: No cranial nerve deficit.  Psychiatric:        Mood and Affect: Mood normal.        Behavior: Behavior normal.    DG Knee Complete 4 Views Left  Result Date: 01/25/2021 CLINICAL DATA:  left knee pain, swelling EXAM: LEFT KNEE - COMPLETE 4+ VIEW COMPARISON:  None. FINDINGS: There is tricompartment degenerative change of the left knee with mild medial joint space narrowing and possible small osteochondral defect of the medial femoral condyle. Dystrophic calcification at the proximal MCL insertion, consistent with prior injury. Trace joint fluid. No evidence of acute fracture. IMPRESSION: Tricompartment osteoarthritis of the left knee, worst in the medial compartment. Possible small osteochondral  defect of the medial femoral condyle. Electronically Signed   By: Maurine Simmering   On: 01/25/2021 12:58     Assessment and Plan :   PDMP not reviewed this encounter.  1. Tricompartment osteoarthritis of left knee   2. Acute pain of left knee   3. Essential hypertension     Unfortunately due to her severely elevated blood pressure I cannot risk the use of steroids for her.  Counseled that she needs to follow-up with her PCP as soon as possible for better blood pressure control.  Recommended Tylenol, tramadol and  close follow-up with an orthopedist for further management of her tricompartment osteoarthritis of the left knee.  I applied a 6 inch Ace wrap to her left knee. Counseled patient on potential for adverse effects with medications prescribed/recommended today, ER and return-to-clinic precautions discussed, patient verbalized understanding.    Jaynee Eagles, PA-C 01/25/21 1325

## 2021-01-25 NOTE — ED Notes (Signed)
BP reported to [place], [place]

## 2021-01-25 NOTE — ED Triage Notes (Signed)
Pt presents with swelling and pain in the left knee x 1 week. Denies fall, injury

## 2021-01-27 ENCOUNTER — Ambulatory Visit: Payer: Medicare HMO | Admitting: Family Medicine

## 2021-01-27 ENCOUNTER — Other Ambulatory Visit: Payer: Self-pay

## 2021-01-27 VITALS — BP 200/80 | Ht 64.0 in | Wt 199.0 lb

## 2021-01-27 DIAGNOSIS — M25562 Pain in left knee: Secondary | ICD-10-CM | POA: Diagnosis not present

## 2021-01-27 MED ORDER — METHYLPREDNISOLONE ACETATE 40 MG/ML IJ SUSP
40.0000 mg | Freq: Once | INTRAMUSCULAR | Status: AC
  Administered 2021-01-27: 40 mg via INTRA_ARTICULAR

## 2021-01-27 NOTE — Progress Notes (Signed)
PCP: [name], MD  Subjective:   HPI: Patient is a 75 y.o. female here for Evaluation of left knee pain.  The HPI was obtained using in person Patent attorney.  The patient was also accompanied by her husband.  The patient reports she has had knee pain in her left knee for the last 1-2 weeks. The knee pain is insidious; she denies any inciting injury or trauma.  The pain is located on the medial side of her knee.  She does report feeling some tightness within the medial part in the posterior aspect of her knee.  Her pain is worse with activity, specifically walking up and down steps.  She denies any redness, warmth to the knee; no numbness tingling or radiation down the lower extremity.  She reports her knee pain is quite severe that she has had difficulty walking over the last few days.  In the urgent care, she had an Ace wrap applied to the left knee and prescribed a short course of tramadol, which only provides moderate relief.  The patient denies any prior injury or pathology within this knee.  Of note, the patient does state she has a high blood pressure.  She does see a primary care physician for this.  Her blood pressure was also elevated but in the emergency room yesterday, and per the providers note this is why no steroid was provided.  Past Medical History:  Diagnosis Date   Back pain    Broken arm    age 65 years old broke right arm after a fall   Gall bladder stones    Gestational diabetes    Hemorrhoid    Hx of cardiac cath    [facility] 07/24/14 at [facility] - no significant CAD   Hx of cardiovascular stress test    Myoview 11/14:  No ischemia   Hx of echocardiogram    Echo 07/23/14 - at [facility]:  EF 65-70%, impaired relaxation, mild LAE, mild MR, mild AI, mild elevated PASP (44.2 mmHg)   Hyperlipidemia    Hypertension    LBBB (left bundle branch block)    Low blood pressure    Nerve damage    right arm   Pain    Left upper chest s/p pacer maker insertion site 07/2014   S/P  cardiac pacemaker procedure 07/2014   St. Jude - Dr. Lovena Le   Stroke [facility])    patient was unaware noted prior to pacermaker placement unknown when it occurred    Current Outpatient Medications on File Prior to Visit  Medication Sig Dispense Refill   Calcium Carbonate-Vitamin D (CALCIUM 600+D) 600-400 MG-UNIT tablet Take 1 tablet by mouth daily.     carvedilol (COREG) 25 MG tablet TAKE 1 TABLET (25 MG TOTAL) BY MOUTH 2 (TWO) TIMES DAILY. 180 tablet 3   ibuprofen (ADVIL,MOTRIN) 600 MG tablet Take 1 tablet (600 mg total) by mouth every 6 (six) hours as needed for moderate pain. 30 tablet 0   Multiple Vitamin (MULTIVITAMIN WITH MINERALS) TABS tablet Take 1 tablet by mouth daily.     nitroGLYCERIN (NITROSTAT) 0.4 MG SL tablet Place 1 tablet (0.4 mg total) under the tongue every 5 (five) minutes as needed for chest pain. 30 tablet 3   traMADol (ULTRAM) 50 MG tablet Take 1 tablet (50 mg total) by mouth every 6 (six) hours as needed. 15 tablet 0   VITAMIN E PO Take 1 capsule by mouth daily.      No current facility-administered medications on file prior  to visit.    Past Surgical History:  Procedure Laterality Date   COLONOSCOPY     DILATATION & CURETTAGE/HYSTEROSCOPY WITH MYOSURE N/A 02/29/2016   Procedure: DILATATION & CURETTAGE/HYSTEROSCOPY WITH MYOSURE;  Surgeon: [name], DO;  Location: [place] [facility];  Service: Gynecology;  Laterality: N/A;  patient has a pacemaker    [place]  04/20/2016   Procedure: LAPAROSCOPIC BILATERAL SALPINGO OOPHORECTOMY;  Surgeon: [name], DO;  Location: [place] [facility];  Service: Gynecology;;   LEAD REVISION N/A 07/25/2014   Procedure: LEAD REVISION;  Surgeon: Deboraha Sprang, MD;  Location: [facility] CATH LAB;  Service: Cardiovascular;  Laterality: N/A;   PERMANENT PACEMAKER INSERTION N/A 07/24/2014   Procedure: PERMANENT PACEMAKER INSERTION;  Surgeon: [name], MD;  Location: [facility] CATH LAB;  Service:  Cardiovascular;  Laterality: N/A;   VAGINAL HYSTERECTOMY  04/20/2016   Procedure: HYSTERECTOMY VAGINAL;  Surgeon: [name], DO;  Location: [place] [facility];  Service: Gynecology;;    Allergies  Allergen Reactions   Ace Inhibitors Cough   Codeine Other (See Comments)    Reaction:  Unknown    Hydrocodone-Acetaminophen Other (See Comments)    Reaction:  Unknown    Lisinopril-Hydrochlorothiazide Cough   Oxycodone-Acetaminophen Nausea And Vomiting    Social History   Socioeconomic History   Marital status: Married    Spouse name: Not on file   Number of children: Not on file   Years of education: Not on file   Highest education level: Not on file  Occupational History   Not on file  Tobacco Use   Smoking status: Never   Smokeless tobacco: Never  Substance and Sexual Activity   Alcohol use: No   Drug use: No   Sexual activity: Yes    Birth control/protection: Post-menopausal  Other Topics Concern   Not on file  Social History Narrative   Not on file   Social Determinants of Health   Financial Resource Strain: Not on file  Food Insecurity: Not on file  Transportation Needs: Not on file  Physical Activity: Not on file  Stress: Not on file  Social Connections: Not on file  Intimate Partner Violence: Not on file    Family History  Problem Relation Age of Onset   Heart attack Father    Stroke Father    Heart disease Brother     BP (!) 200/80   Ht 5\' 4"  (1.626 m)   Wt 199 lb (90.3 kg)   BMI 34.16 kg/m   No flowsheet data found.  No flowsheet data found.  Review of Systems: See HPI above.     Objective:  Physical Exam:  Gen: Well-appearing, in no acute distress CV: Regular Rate. Well-perfused. Warm.  Resp: Breathing unlabored on room air; no wheezing. Psych: Fluid speech in conversation; appropriate affect; normal thought process Neuro: Sensation intact throughout. No gross coordination deficits.  MSK: Antalgic gait  Knee, left. There is tenderness to  palpation over the medial joint line.  Inspection was negative for erythema, ecchymosis or notable effusion.  There are no obvious bony abnormalities or signs of osteophyte development; alignment appears physiologic.  There is minimal joint line tenderness in the lateral aspect.  No patellar tenderness.  Mild knee crepitus.  Patient has full extension of the knee, however is limited in flexion secondary to pain to approximately 110 degrees.  There is no instability to varus or valgus stress at 0 and 15, However valgus stress does reproduce patient's pain.  Strength 5/5 with knee flexion and extension.  Neurovascular intact.  Provocative testing: Negative anterior/posterior drawer, negative Lachman's.  Positive Apley's compression with external rotation, positive McMurray's on medial aspect with pain, however no clicking.    Assessment & Plan:  1. Left knee pain, acute - this is likely secondary to aggravation of her known tricompartmental osteoarthritis. I did review the patient's x-rays from the urgent care, which shows mild to moderate medial joint space narrowing.  There is also notable dystrophic calcification at the proximal MCL insertion, however this appears chronic.   2. Uncontrolled hypertension - Given the severity and acuity of her pain, we did provide the patient with a cortisone injection after discussing her options with her, see procedure note below - Given the patient's uncontrolled hypertension, we discussed the importance of avoiding NSAID therapy at this time; She may use ice for pain relief. - We also discussed the option using a knee sleeve to help with compression and support --> she may trial this after acute knee pain subsides  - Patient may follow-up as needed, if her pain continues, will likely undergo formal physical therapy at that time - discussed importance of following up with PCP for uncontrolled HTN  Procedure: After informed written consent timeout was performed,  patient was seated on exam table. The patient's left knee was prepped with alcohol swab and utilizing an anteromedial approach, the patient's knee was injected intraarticularly with 3:1 lidocaine: depomedrol. Patient tolerated the procedure well without immediate complications.   Elba Barman, DO PGY-4, Sports Medicine Fellow [place]

## 2021-01-27 NOTE — Patient Instructions (Signed)
It was great to see you today, thank you for letting me participate in your care!  Today, we discussed your knee arthritis, this is likely the cause of your knee pain. We gave you a cortisone injection which should help the pain. If the knee is still bothering you in a few weeks, feel free to call to setup another appointment. You made also try a knee sleeve/brace for compression.  You will follow-up as needed. Be sure to see your primary care doctor for your blood pressure.  If you have any further questions, please give the clinic a call [phone].  [place],  Elba Barman, DO PGY-4, Sports Medicine Fellow [place]

## 2021-01-28 ENCOUNTER — Encounter: Payer: Self-pay | Admitting: Family Medicine

## 2021-02-16 ENCOUNTER — Ambulatory Visit (INDEPENDENT_AMBULATORY_CARE_PROVIDER_SITE_OTHER): Payer: Medicare HMO

## 2021-02-16 DIAGNOSIS — I442 Atrioventricular block, complete: Secondary | ICD-10-CM

## 2021-02-16 LAB — CUP PACEART REMOTE DEVICE CHECK
Battery Remaining Longevity: 50 mo
Battery Remaining Percentage: 40 %
Battery Voltage: 2.98 V
Brady Statistic AP VP Percent: 58 %
Brady Statistic AP VS Percent: 4.5 %
Brady Statistic AS VP Percent: 32 %
Brady Statistic AS VS Percent: 4.7 %
Brady Statistic RA Percent Paced: 63 %
Brady Statistic RV Percent Paced: 91 %
Date Time Interrogation Session: 20220808020013
Implantable Lead Implant Date: 20160114
Implantable Lead Implant Date: 20160114
Implantable Lead Location: 753859
Implantable Lead Location: 753860
Implantable Pulse Generator Implant Date: 20160114
Lead Channel Impedance Value: 510 Ohm
Lead Channel Impedance Value: 630 Ohm
Lead Channel Pacing Threshold Amplitude: 0.375 V
Lead Channel Pacing Threshold Amplitude: 0.875 V
Lead Channel Pacing Threshold Pulse Width: 0.4 ms
Lead Channel Pacing Threshold Pulse Width: 0.4 ms
Lead Channel Sensing Intrinsic Amplitude: 12 mV
Lead Channel Sensing Intrinsic Amplitude: 5 mV
Lead Channel Setting Pacing Amplitude: 1.125
Lead Channel Setting Pacing Amplitude: 1.375
Lead Channel Setting Pacing Pulse Width: 0.4 ms
Lead Channel Setting Sensing Sensitivity: 4 mV
Pulse Gen Model: 2240
Pulse Gen Serial Number: 7706772

## 2021-03-11 NOTE — Progress Notes (Signed)
Remote pacemaker transmission.   

## 2021-03-24 ENCOUNTER — Other Ambulatory Visit: Payer: Self-pay | Admitting: Family Medicine

## 2021-03-24 DIAGNOSIS — R7303 Prediabetes: Secondary | ICD-10-CM | POA: Diagnosis not present

## 2021-03-24 DIAGNOSIS — I459 Conduction disorder, unspecified: Secondary | ICD-10-CM | POA: Diagnosis not present

## 2021-03-24 DIAGNOSIS — Z1231 Encounter for screening mammogram for malignant neoplasm of breast: Secondary | ICD-10-CM

## 2021-03-24 DIAGNOSIS — Z950 Presence of cardiac pacemaker: Secondary | ICD-10-CM | POA: Diagnosis not present

## 2021-03-24 DIAGNOSIS — M8580 Other specified disorders of bone density and structure, unspecified site: Secondary | ICD-10-CM

## 2021-03-24 DIAGNOSIS — Z1389 Encounter for screening for other disorder: Secondary | ICD-10-CM | POA: Diagnosis not present

## 2021-03-24 DIAGNOSIS — L309 Dermatitis, unspecified: Secondary | ICD-10-CM | POA: Diagnosis not present

## 2021-03-24 DIAGNOSIS — E7800 Pure hypercholesterolemia, unspecified: Secondary | ICD-10-CM | POA: Diagnosis not present

## 2021-03-24 DIAGNOSIS — Z0000 Encounter for general adult medical examination without abnormal findings: Secondary | ICD-10-CM | POA: Diagnosis not present

## 2021-03-24 DIAGNOSIS — E785 Hyperlipidemia, unspecified: Secondary | ICD-10-CM | POA: Diagnosis not present

## 2021-03-24 DIAGNOSIS — I10 Essential (primary) hypertension: Secondary | ICD-10-CM | POA: Diagnosis not present

## 2021-04-02 ENCOUNTER — Ambulatory Visit (INDEPENDENT_AMBULATORY_CARE_PROVIDER_SITE_OTHER): Payer: Medicare HMO | Admitting: Internal Medicine

## 2021-04-02 ENCOUNTER — Other Ambulatory Visit: Payer: Self-pay

## 2021-04-02 VITALS — BP 150/84 | HR 64 | Ht 64.0 in | Wt 202.0 lb

## 2021-04-02 DIAGNOSIS — Z950 Presence of cardiac pacemaker: Secondary | ICD-10-CM

## 2021-04-02 DIAGNOSIS — I442 Atrioventricular block, complete: Secondary | ICD-10-CM | POA: Diagnosis not present

## 2021-04-02 DIAGNOSIS — I10 Essential (primary) hypertension: Secondary | ICD-10-CM | POA: Diagnosis not present

## 2021-04-02 NOTE — Patient Instructions (Signed)
Medication Instructions:  Your physician recommends that you continue on your current medications as directed. Please refer to the Current Medication list given to you today.  Labwork: None ordered.  Testing/Procedures: None ordered.  Follow-Up: Your physician wants you to follow-up in: one year with Cristopher Peru, MD or one of the following Advanced Practice Providers on your designated Care Team:   Tommye Standard, [place] Legrand Como "Jonni Sanger" Chalmers Cater, [place]  Remote monitoring is used to monitor your Pacemaker from home. This monitoring reduces the number of office visits required to check your device to one time per year. It allows [place] to keep an eye on the functioning of your device to ensure it is working properly. You are scheduled for a device check from home on 05/18/2021. You may send your transmission at any time that day. If you have a wireless device, the transmission will be sent automatically. After your physician reviews your transmission, you will receive a postcard with your next transmission date.  Any Other Special Instructions Will Be Listed Below (If Applicable).  If you need a refill on your cardiac medications before your next appointment, please call your pharmacy.

## 2021-04-02 NOTE — Progress Notes (Signed)
HPI Elaine Ford returns today for ongoing followup. She is a pleasant 75 yo woman with CHB, s/p PPM insertion. She has obesity and HTN. She developed left arm swelling after her PPM, presumably due to venous thrombosis. She has not had syncope. Her arm swelling is mild. She has not lost weight. She denies chest pain.  Allergies  Allergen Reactions   Ace Inhibitors Cough   Codeine Other (See Comments)    Reaction:  Unknown    Hydrocodone-Acetaminophen Other (See Comments)    Reaction:  Unknown    Lisinopril-Hydrochlorothiazide Cough   Oxycodone-Acetaminophen Nausea And Vomiting     Current Outpatient Medications  Medication Sig Dispense Refill   Calcium Carbonate-Vitamin D 600-400 MG-UNIT tablet Take 1 tablet by mouth daily.     carvedilol (COREG) 25 MG tablet TAKE 1 TABLET (25 MG TOTAL) BY MOUTH 2 (TWO) TIMES DAILY. 180 tablet 3   Multiple Vitamin (MULTIVITAMIN WITH MINERALS) TABS tablet Take 1 tablet by mouth daily.     VITAMIN E PO Take 1 capsule by mouth daily.      ibuprofen (ADVIL,MOTRIN) 600 MG tablet Take 1 tablet (600 mg total) by mouth every 6 (six) hours as needed for moderate pain. 30 tablet 0   nitroGLYCERIN (NITROSTAT) 0.4 MG SL tablet Place 1 tablet (0.4 mg total) under the tongue every 5 (five) minutes as needed for chest pain. 30 tablet 3   traMADol (ULTRAM) 50 MG tablet Take 1 tablet (50 mg total) by mouth every 6 (six) hours as needed. 15 tablet 0   No current facility-administered medications for this visit.     Past Medical History:  Diagnosis Date   Back pain    Broken arm    age 26 years old broke right arm after a fall   Gall bladder stones    Gestational diabetes    Hemorrhoid    Hx of cardiac cath    [facility] 07/24/14 at [facility] - no significant CAD   Hx of cardiovascular stress test    Myoview 11/14:  No ischemia   Hx of echocardiogram    Echo 07/23/14 - at [facility]:  EF 65-70%, impaired relaxation, mild LAE, mild MR, mild AI, mild elevated PASP (44.2  mmHg)   Hyperlipidemia    Hypertension    LBBB (left bundle branch block)    Low blood pressure    Nerve damage    right arm   Pain    Left upper chest s/p pacer maker insertion site 07/2014   S/P cardiac pacemaker procedure 07/2014   [employer] - Dr. Lovena Le   Stroke [facility])    patient was unaware noted prior to pacermaker placement unknown when it occurred    ROS:   All systems reviewed and negative except as noted in the HPI.   Past Surgical History:  Procedure Laterality Date   COLONOSCOPY     DILATATION & CURETTAGE/HYSTEROSCOPY WITH MYOSURE N/A 02/29/2016   Procedure: DILATATION & CURETTAGE/HYSTEROSCOPY WITH MYOSURE;  Surgeon: [name], DO;  Location: [facility];  Service: Gynecology;  Laterality: N/A;  patient has a pacemaker    [place]  04/20/2016   Procedure: LAPAROSCOPIC BILATERAL SALPINGO OOPHORECTOMY;  Surgeon: [name], DO;  Location: [facility];  Service: Gynecology;;   LEAD REVISION N/A 07/25/2014   Procedure: LEAD REVISION;  Surgeon: Deboraha Sprang, MD;  Location: [facility] CATH LAB;  Service: Cardiovascular;  Laterality: N/A;   PERMANENT PACEMAKER INSERTION  N/A 07/24/2014   Procedure: PERMANENT PACEMAKER INSERTION;  Surgeon: [name], MD;  Location: [facility] CATH LAB;  Service: Cardiovascular;  Laterality: N/A;   VAGINAL HYSTERECTOMY  04/20/2016   Procedure: HYSTERECTOMY VAGINAL;  Surgeon: [name], DO;  Location: [facility];  Service: Gynecology;;     Family History  Problem Relation Age of Onset   Heart attack Father    Stroke Father    Heart disease Brother      Social History   Socioeconomic History   Marital status: Married    Spouse name: Not on file   Number of children: Not on file   Years of education: Not on file   Highest education level: Not on file  Occupational History   Not on file  Tobacco Use   Smoking status: Never   Smokeless tobacco: Never  Substance and Sexual Activity    Alcohol use: No   Drug use: No   Sexual activity: Yes    Birth control/protection: Post-menopausal  Other Topics Concern   Not on file  Social History Narrative   Not on file   Social Determinants of Health   Financial Resource Strain: Not on file  Food Insecurity: Not on file  Transportation Needs: Not on file  Physical Activity: Not on file  Stress: Not on file  Social Connections: Not on file  Intimate Partner Violence: Not on file     BP (!) 150/84   Pulse 64   Ht 5\' 4"  (1.626 m)   Wt 202 lb (91.6 kg)   SpO2 96%   BMI 34.67 kg/m   Physical Exam:  Well appearing NAD HEENT: Unremarkable Neck:  No JVD, no thyromegally Lymphatics:  No adenopathy Back:  No CVA tenderness Lungs:  Clear HEART:  Regular rate rhythm, no murmurs, no rubs, no clicks Abd:  soft, positive bowel sounds, no organomegally, no rebound, no guarding Ext:  2 plus pulses, no edema, no cyanosis, no clubbing Skin:  No rashes no nodules Neuro:  CN II through XII intact, motor grossly intact  EKG - nsr with ventricular pacing  DEVICE  Normal device function.  See PaceArt for details.   Assess/Plan:  1. CHB - she is asymptomatic s/p PPM insertion. 2. PPM - her [employer] Jude DDD PM is working normally. 3. Left arm swelling - she still has a little bit of swelling and tenderness in her left arm. I encouraged her to keep the arm elevated.  4. Obesity - she is encouraged to lose weight.    Elaine Overlie Roberth Berling,MD

## 2021-04-14 DIAGNOSIS — R300 Dysuria: Secondary | ICD-10-CM | POA: Diagnosis not present

## 2021-04-14 DIAGNOSIS — R102 Pelvic and perineal pain: Secondary | ICD-10-CM | POA: Diagnosis not present

## 2021-04-14 DIAGNOSIS — Z01419 Encounter for gynecological examination (general) (routine) without abnormal findings: Secondary | ICD-10-CM | POA: Diagnosis not present

## 2021-04-14 DIAGNOSIS — L304 Erythema intertrigo: Secondary | ICD-10-CM | POA: Diagnosis not present

## 2021-05-18 ENCOUNTER — Ambulatory Visit (INDEPENDENT_AMBULATORY_CARE_PROVIDER_SITE_OTHER): Payer: Medicare HMO

## 2021-05-18 DIAGNOSIS — I442 Atrioventricular block, complete: Secondary | ICD-10-CM

## 2021-05-18 LAB — CUP PACEART REMOTE DEVICE CHECK
Battery Remaining Longevity: 49 mo
Battery Remaining Percentage: 37 %
Battery Voltage: 2.96 V
Brady Statistic AP VP Percent: 10 %
Brady Statistic AP VS Percent: 29 %
Brady Statistic AS VP Percent: 24 %
Brady Statistic AS VS Percent: 37 %
Brady Statistic RA Percent Paced: 39 %
Brady Statistic RV Percent Paced: 34 %
Date Time Interrogation Session: 20221107042422
Implantable Lead Implant Date: 20160114
Implantable Lead Implant Date: 20160114
Implantable Lead Location: 753859
Implantable Lead Location: 753860
Implantable Pulse Generator Implant Date: 20160114
Lead Channel Impedance Value: 480 Ohm
Lead Channel Impedance Value: 730 Ohm
Lead Channel Pacing Threshold Amplitude: 0.375 V
Lead Channel Pacing Threshold Amplitude: 0.75 V
Lead Channel Pacing Threshold Pulse Width: 0.4 ms
Lead Channel Pacing Threshold Pulse Width: 0.4 ms
Lead Channel Sensing Intrinsic Amplitude: 12 mV
Lead Channel Sensing Intrinsic Amplitude: 3.5 mV
Lead Channel Setting Pacing Amplitude: 1 V
Lead Channel Setting Pacing Amplitude: 1.375
Lead Channel Setting Pacing Pulse Width: 0.4 ms
Lead Channel Setting Sensing Sensitivity: 4 mV
Pulse Gen Model: 2240
Pulse Gen Serial Number: 7706772

## 2021-05-26 NOTE — Progress Notes (Signed)
Remote pacemaker transmission.   

## 2021-07-15 ENCOUNTER — Other Ambulatory Visit (HOSPITAL_COMMUNITY): Payer: Self-pay | Admitting: Internal Medicine

## 2021-07-15 ENCOUNTER — Other Ambulatory Visit: Payer: Self-pay

## 2021-07-15 ENCOUNTER — Ambulatory Visit (HOSPITAL_COMMUNITY)
Admission: RE | Admit: 2021-07-15 | Discharge: 2021-07-15 | Disposition: A | Discharge status: Home / Self Care | Payer: Medicare HMO | Source: Ambulatory Visit | Attending: Internal Medicine | Admitting: Internal Medicine

## 2021-07-15 DIAGNOSIS — M79669 Pain in unspecified lower leg: Secondary | ICD-10-CM | POA: Insufficient documentation

## 2021-07-15 DIAGNOSIS — M79605 Pain in left leg: Secondary | ICD-10-CM | POA: Diagnosis not present

## 2021-07-15 DIAGNOSIS — M7989 Other specified soft tissue disorders: Secondary | ICD-10-CM | POA: Insufficient documentation

## 2021-07-15 DIAGNOSIS — R233 Spontaneous ecchymoses: Secondary | ICD-10-CM | POA: Diagnosis not present

## 2021-07-15 DIAGNOSIS — M79604 Pain in right leg: Secondary | ICD-10-CM | POA: Diagnosis not present

## 2021-07-15 NOTE — Progress Notes (Signed)
Bilateral lower extremity venous duplex completed. Refer to "CV Proc" under chart review to view preliminary results.  07/15/2021 3:12 PM Kelby Aline., MHA, RVT, RDCS, RDMS

## 2021-07-23 DIAGNOSIS — M7989 Other specified soft tissue disorders: Secondary | ICD-10-CM | POA: Diagnosis not present

## 2021-07-23 DIAGNOSIS — R233 Spontaneous ecchymoses: Secondary | ICD-10-CM | POA: Diagnosis not present

## 2021-08-04 DIAGNOSIS — M26622 Arthralgia of left temporomandibular joint: Secondary | ICD-10-CM | POA: Diagnosis not present

## 2021-08-04 DIAGNOSIS — H9202 Otalgia, left ear: Secondary | ICD-10-CM | POA: Diagnosis not present

## 2021-08-17 ENCOUNTER — Ambulatory Visit (INDEPENDENT_AMBULATORY_CARE_PROVIDER_SITE_OTHER): Payer: Medicare HMO

## 2021-08-17 DIAGNOSIS — I442 Atrioventricular block, complete: Secondary | ICD-10-CM

## 2021-08-17 LAB — CUP PACEART REMOTE DEVICE CHECK
Battery Remaining Longevity: 46 mo
Battery Remaining Percentage: 35 %
Battery Voltage: 2.96 V
Brady Statistic AP VP Percent: 13 %
Brady Statistic AP VS Percent: 28 %
Brady Statistic AS VP Percent: 31 %
Brady Statistic AS VS Percent: 28 %
Brady Statistic RA Percent Paced: 41 %
Brady Statistic RV Percent Paced: 43 %
Date Time Interrogation Session: 20230206030230
Implantable Lead Implant Date: 20160114
Implantable Lead Implant Date: 20160114
Implantable Lead Location: 753859
Implantable Lead Location: 753860
Implantable Pulse Generator Implant Date: 20160114
Lead Channel Impedance Value: 480 Ohm
Lead Channel Impedance Value: 660 Ohm
Lead Channel Pacing Threshold Amplitude: 0.5 V
Lead Channel Pacing Threshold Amplitude: 0.875 V
Lead Channel Pacing Threshold Pulse Width: 0.4 ms
Lead Channel Pacing Threshold Pulse Width: 0.4 ms
Lead Channel Sensing Intrinsic Amplitude: 12 mV
Lead Channel Sensing Intrinsic Amplitude: 3.6 mV
Lead Channel Setting Pacing Amplitude: 1.125
Lead Channel Setting Pacing Amplitude: 1.5 V
Lead Channel Setting Pacing Pulse Width: 0.4 ms
Lead Channel Setting Sensing Sensitivity: 4 mV
Pulse Gen Model: 2240
Pulse Gen Serial Number: 7706772

## 2021-08-20 NOTE — Progress Notes (Signed)
Remote pacemaker transmission.   

## 2021-09-06 ENCOUNTER — Ambulatory Visit: Payer: Medicare HMO

## 2021-09-06 ENCOUNTER — Other Ambulatory Visit: Payer: Medicare HMO

## 2021-09-11 ENCOUNTER — Encounter: Payer: Self-pay | Admitting: Internal Medicine

## 2021-09-13 MED ORDER — NITROGLYCERIN 0.4 MG SL SUBL: 0.4000 mg | SUBLINGUAL_TABLET | SUBLINGUAL | 6 refills | Status: DC | PRN

## 2021-10-29 DIAGNOSIS — J9801 Acute bronchospasm: Secondary | ICD-10-CM | POA: Diagnosis not present

## 2021-10-29 DIAGNOSIS — R7303 Prediabetes: Secondary | ICD-10-CM | POA: Diagnosis not present

## 2021-10-29 DIAGNOSIS — I10 Essential (primary) hypertension: Secondary | ICD-10-CM | POA: Diagnosis not present

## 2021-10-29 DIAGNOSIS — E785 Hyperlipidemia, unspecified: Secondary | ICD-10-CM | POA: Diagnosis not present

## 2021-11-16 ENCOUNTER — Ambulatory Visit (INDEPENDENT_AMBULATORY_CARE_PROVIDER_SITE_OTHER): Payer: Medicare HMO

## 2021-11-16 DIAGNOSIS — I442 Atrioventricular block, complete: Secondary | ICD-10-CM | POA: Diagnosis not present

## 2021-11-16 LAB — CUP PACEART REMOTE DEVICE CHECK
Battery Remaining Longevity: 42 mo
Battery Remaining Percentage: 33 %
Battery Voltage: 2.95 V
Brady Statistic AP VP Percent: 15 %
Brady Statistic AP VS Percent: 31 %
Brady Statistic AS VP Percent: 26 %
Brady Statistic AS VS Percent: 28 %
Brady Statistic RA Percent Paced: 46 %
Brady Statistic RV Percent Paced: 40 %
Date Time Interrogation Session: 20230508020013
Implantable Lead Implant Date: 20160114
Implantable Lead Implant Date: 20160114
Implantable Lead Location: 753859
Implantable Lead Location: 753860
Implantable Pulse Generator Implant Date: 20160114
Lead Channel Impedance Value: 460 Ohm
Lead Channel Impedance Value: 590 Ohm
Lead Channel Pacing Threshold Amplitude: 0.5 V
Lead Channel Pacing Threshold Amplitude: 0.875 V
Lead Channel Pacing Threshold Pulse Width: 0.4 ms
Lead Channel Pacing Threshold Pulse Width: 0.4 ms
Lead Channel Sensing Intrinsic Amplitude: 12 mV
Lead Channel Sensing Intrinsic Amplitude: 3.2 mV
Lead Channel Setting Pacing Amplitude: 1.125
Lead Channel Setting Pacing Amplitude: 1.5 V
Lead Channel Setting Pacing Pulse Width: 0.4 ms
Lead Channel Setting Sensing Sensitivity: 4 mV
Pulse Gen Model: 2240
Pulse Gen Serial Number: 7706772

## 2021-11-29 NOTE — Progress Notes (Signed)
Remote pacemaker transmission.   

## 2022-02-15 ENCOUNTER — Ambulatory Visit (INDEPENDENT_AMBULATORY_CARE_PROVIDER_SITE_OTHER): Payer: Medicare HMO

## 2022-02-15 DIAGNOSIS — I442 Atrioventricular block, complete: Secondary | ICD-10-CM | POA: Diagnosis not present

## 2022-02-15 LAB — CUP PACEART REMOTE DEVICE CHECK
Battery Remaining Longevity: 41 mo
Battery Remaining Percentage: 30 %
Battery Voltage: 2.95 V
Brady Statistic AP VP Percent: 19 %
Brady Statistic AP VS Percent: 28 %
Brady Statistic AS VP Percent: 29 %
Brady Statistic AS VS Percent: 24 %
Brady Statistic RA Percent Paced: 47 %
Brady Statistic RV Percent Paced: 48 %
Date Time Interrogation Session: 20230807020016
Implantable Lead Implant Date: 20160114
Implantable Lead Implant Date: 20160114
Implantable Lead Location: 753859
Implantable Lead Location: 753860
Implantable Pulse Generator Implant Date: 20160114
Lead Channel Impedance Value: 480 Ohm
Lead Channel Impedance Value: 680 Ohm
Lead Channel Pacing Threshold Amplitude: 0.5 V
Lead Channel Pacing Threshold Amplitude: 0.75 V
Lead Channel Pacing Threshold Pulse Width: 0.4 ms
Lead Channel Pacing Threshold Pulse Width: 0.4 ms
Lead Channel Sensing Intrinsic Amplitude: 12 mV
Lead Channel Sensing Intrinsic Amplitude: 3.8 mV
Lead Channel Setting Pacing Amplitude: 1 V
Lead Channel Setting Pacing Amplitude: 1.5 V
Lead Channel Setting Pacing Pulse Width: 0.4 ms
Lead Channel Setting Sensing Sensitivity: 4 mV
Pulse Gen Model: 2240
Pulse Gen Serial Number: 7706772

## 2022-02-28 IMAGING — DX DG KNEE COMPLETE 4+V*L*
4 series · 4 of 4 positions shown · non-contrast
Comparison: None.

CLINICAL DATA: left knee pain, swelling

EXAM:
LEFT KNEE - COMPLETE 4+ VIEW

[knee ap]
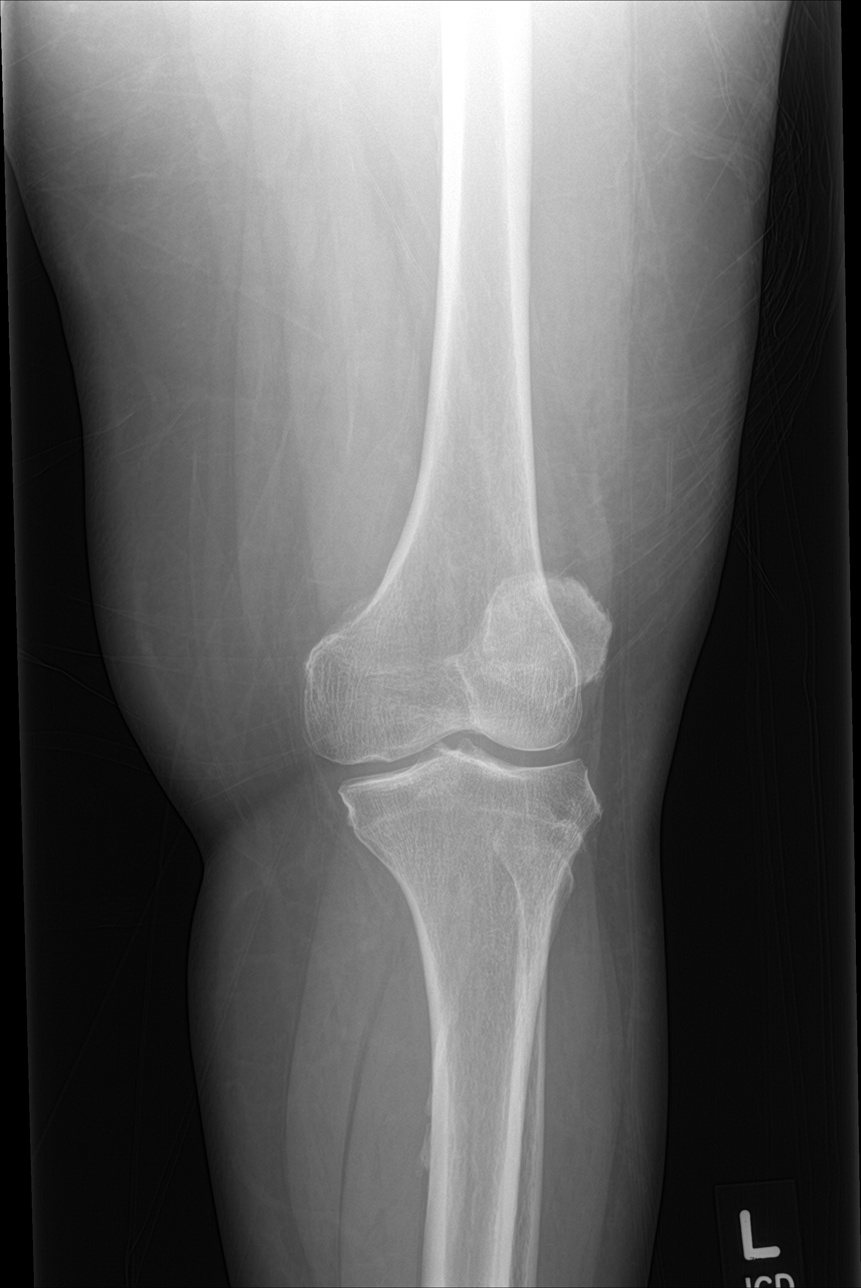

[knee obl (1 of 2)]
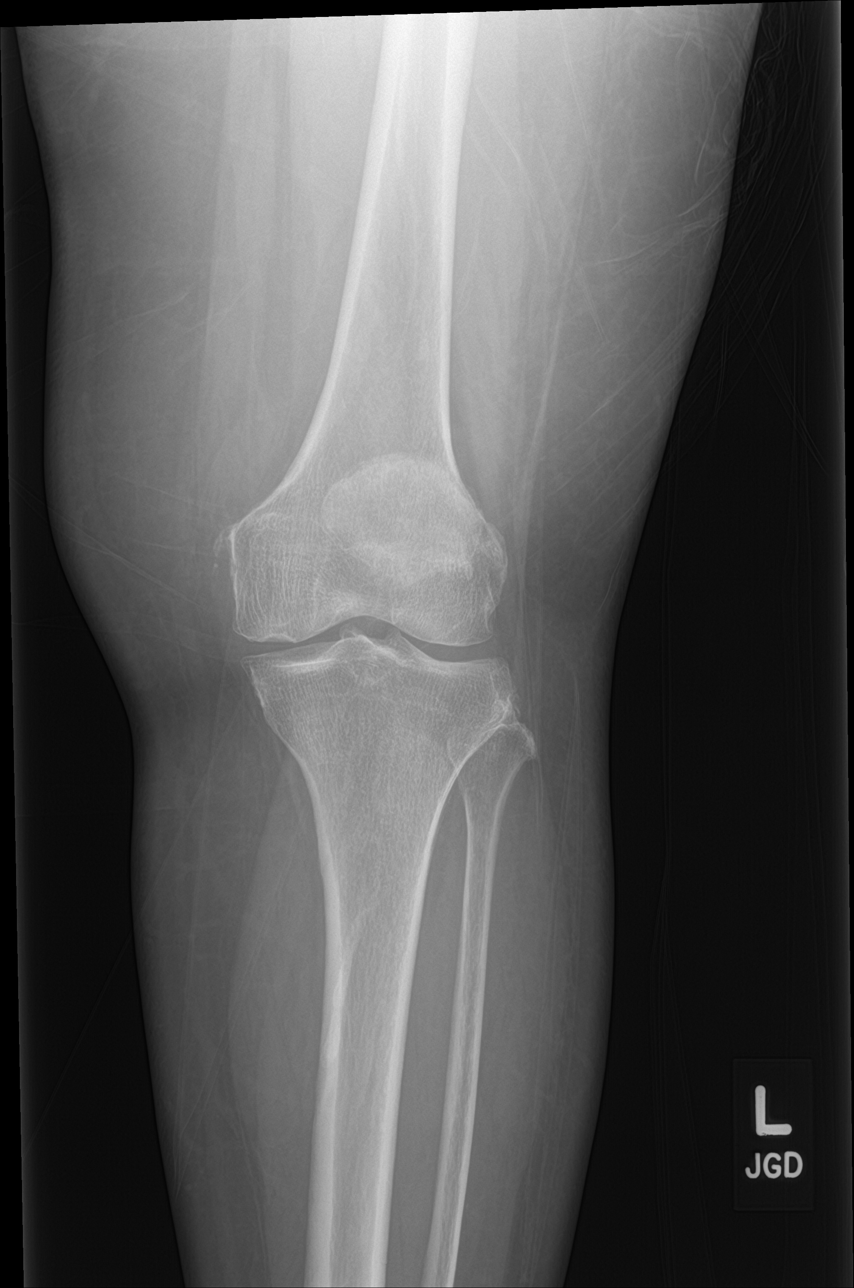

[knee obl (2 of 2)]
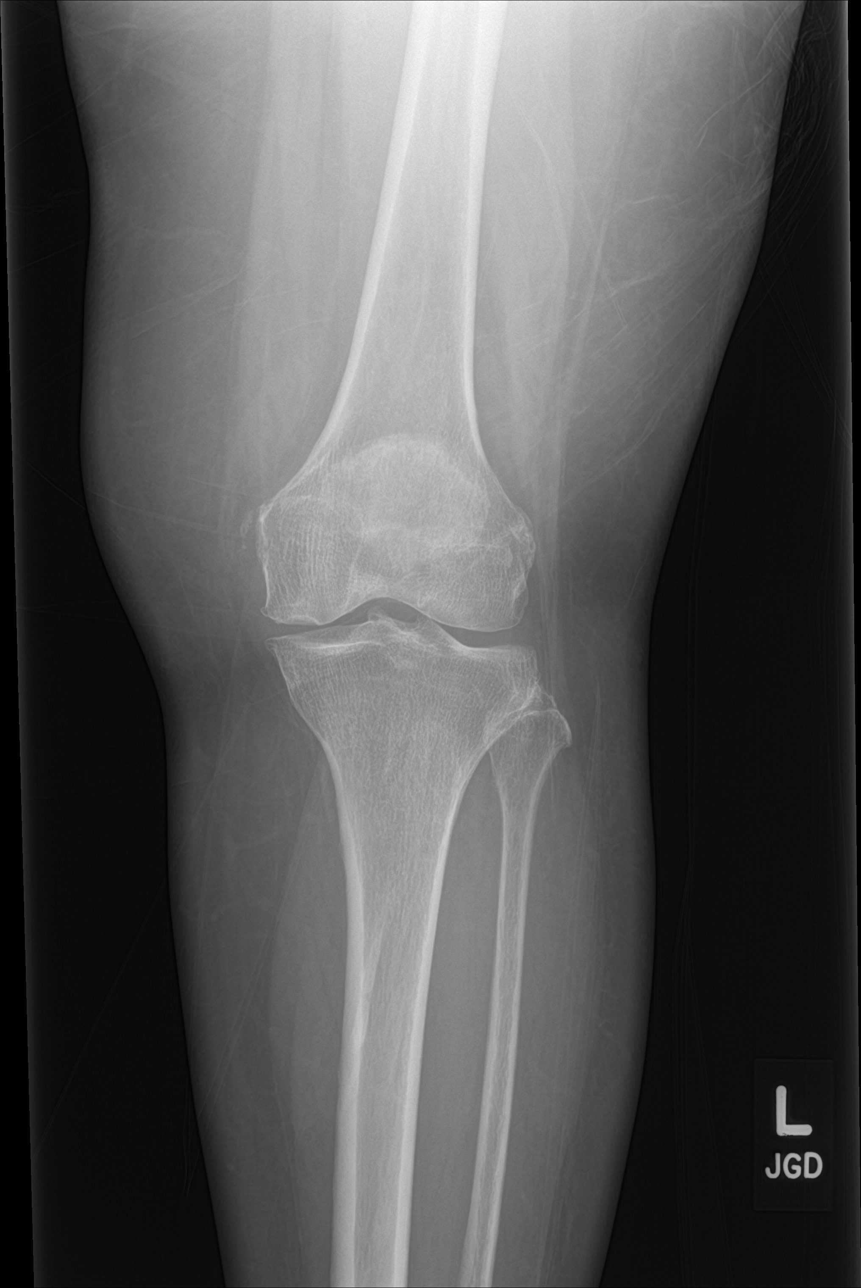

[knee lat]
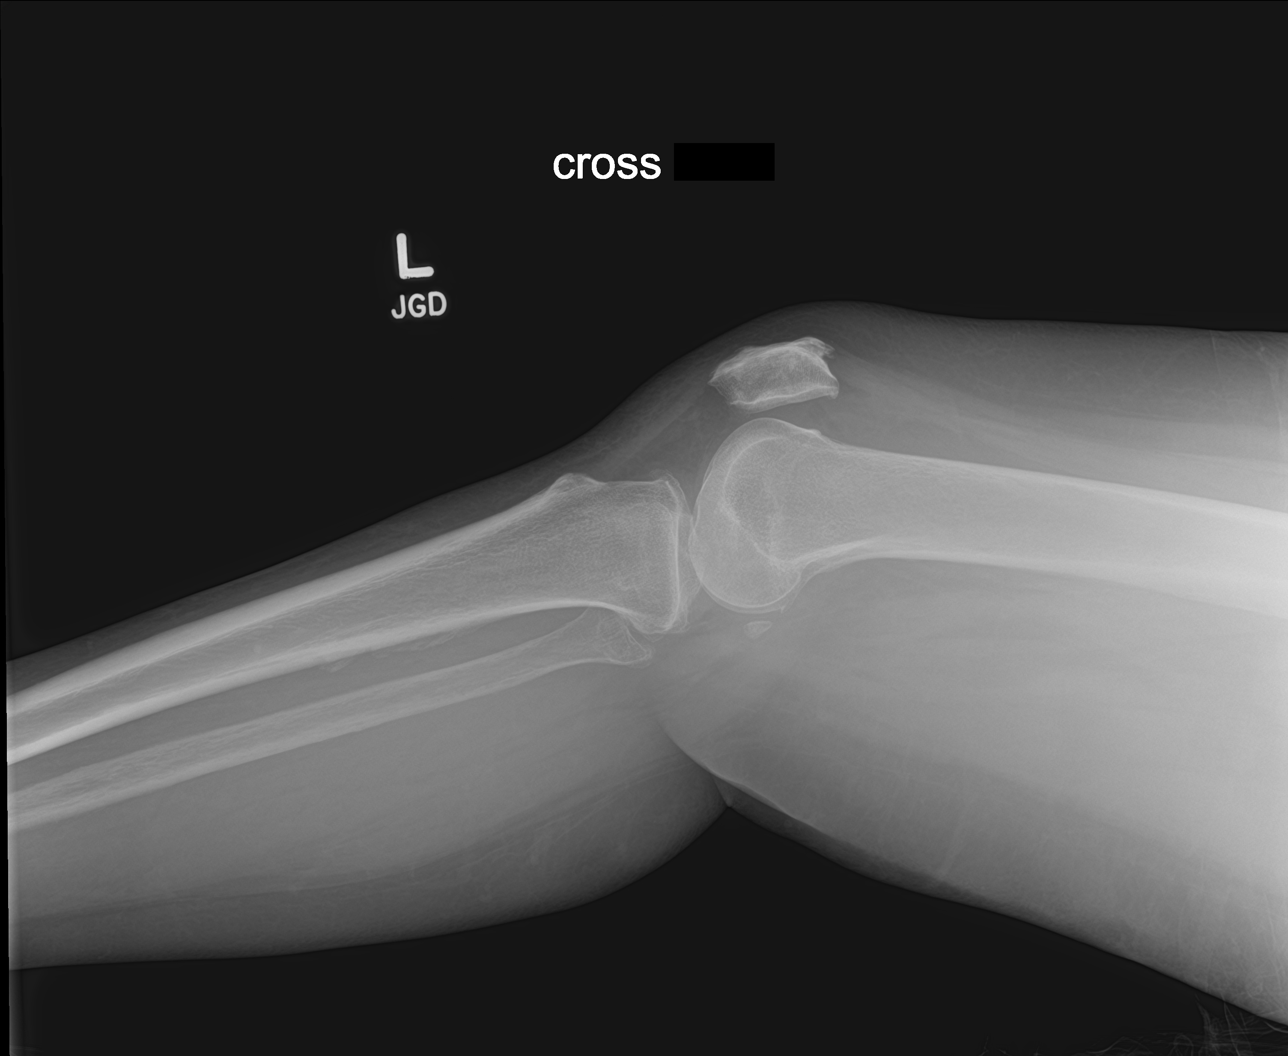

[4 of 4 positions shown; findings below may reference images not displayed]

FINDINGS: There is tricompartment degenerative change of the left knee with
mild medial joint space narrowing and possible small osteochondral
defect of the medial femoral condyle. Dystrophic calcification at
the proximal MCL insertion, consistent with prior injury. Trace
joint fluid. No evidence of acute fracture.
IMPRESSION: Tricompartment osteoarthritis of the left knee, worst in the medial
compartment. Possible small osteochondral defect of the medial
femoral condyle.

## 2022-03-22 NOTE — Progress Notes (Signed)
Remote pacemaker transmission.   

## 2022-03-28 ENCOUNTER — Emergency Department (HOSPITAL_COMMUNITY): Payer: Medicare HMO

## 2022-03-28 ENCOUNTER — Inpatient Hospital Stay (HOSPITAL_COMMUNITY): Payer: Medicare HMO

## 2022-03-28 ENCOUNTER — Inpatient Hospital Stay (HOSPITAL_COMMUNITY)
Admission: EM | Admit: 2022-03-28 | Discharge: 2022-04-06 | DRG: 023 | Disposition: A | Discharge status: Rehabilitation Facility | Payer: Medicare HMO | Attending: Neurology | Admitting: Neurology

## 2022-03-28 ENCOUNTER — Encounter (HOSPITAL_COMMUNITY)
Admission: EM | Disposition: A | Discharge status: Rehabilitation Facility | Payer: Self-pay | Source: Home / Self Care | Attending: Neurology

## 2022-03-28 ENCOUNTER — Telehealth: Payer: Self-pay | Admitting: Internal Medicine

## 2022-03-28 ENCOUNTER — Emergency Department (HOSPITAL_COMMUNITY): Payer: Medicare HMO | Admitting: Certified Registered"

## 2022-03-28 DIAGNOSIS — Z950 Presence of cardiac pacemaker: Secondary | ICD-10-CM | POA: Diagnosis not present

## 2022-03-28 DIAGNOSIS — Z888 Allergy status to other drugs, medicaments and biological substances status: Secondary | ICD-10-CM

## 2022-03-28 DIAGNOSIS — E785 Hyperlipidemia, unspecified: Secondary | ICD-10-CM | POA: Diagnosis present

## 2022-03-28 DIAGNOSIS — I10 Essential (primary) hypertension: Secondary | ICD-10-CM | POA: Diagnosis not present

## 2022-03-28 DIAGNOSIS — K5900 Constipation, unspecified: Secondary | ICD-10-CM | POA: Diagnosis not present

## 2022-03-28 DIAGNOSIS — E119 Type 2 diabetes mellitus without complications: Secondary | ICD-10-CM

## 2022-03-28 DIAGNOSIS — G9349 Other encephalopathy: Secondary | ICD-10-CM | POA: Diagnosis present

## 2022-03-28 DIAGNOSIS — T80818A Extravasation of other vesicant agent, initial encounter: Secondary | ICD-10-CM | POA: Diagnosis not present

## 2022-03-28 DIAGNOSIS — R404 Transient alteration of awareness: Secondary | ICD-10-CM | POA: Diagnosis not present

## 2022-03-28 DIAGNOSIS — Z8249 Family history of ischemic heart disease and other diseases of the circulatory system: Secondary | ICD-10-CM

## 2022-03-28 DIAGNOSIS — E876 Hypokalemia: Secondary | ICD-10-CM | POA: Diagnosis present

## 2022-03-28 DIAGNOSIS — H53461 Homonymous bilateral field defects, right side: Secondary | ICD-10-CM | POA: Diagnosis not present

## 2022-03-28 DIAGNOSIS — S066X0A Traumatic subarachnoid hemorrhage without loss of consciousness, initial encounter: Secondary | ICD-10-CM | POA: Diagnosis not present

## 2022-03-28 DIAGNOSIS — I6623 Occlusion and stenosis of bilateral posterior cerebral arteries: Secondary | ICD-10-CM | POA: Diagnosis not present

## 2022-03-28 DIAGNOSIS — R4701 Aphasia: Secondary | ICD-10-CM | POA: Diagnosis not present

## 2022-03-28 DIAGNOSIS — Z8673 Personal history of transient ischemic attack (TIA), and cerebral infarction without residual deficits: Secondary | ICD-10-CM

## 2022-03-28 DIAGNOSIS — E781 Pure hyperglyceridemia: Secondary | ICD-10-CM | POA: Diagnosis present

## 2022-03-28 DIAGNOSIS — I442 Atrioventricular block, complete: Secondary | ICD-10-CM | POA: Diagnosis not present

## 2022-03-28 DIAGNOSIS — I4892 Unspecified atrial flutter: Secondary | ICD-10-CM | POA: Diagnosis present

## 2022-03-28 DIAGNOSIS — I619 Nontraumatic intracerebral hemorrhage, unspecified: Secondary | ICD-10-CM | POA: Diagnosis not present

## 2022-03-28 DIAGNOSIS — Z6833 Body mass index (BMI) 33.0-33.9, adult: Secondary | ICD-10-CM

## 2022-03-28 DIAGNOSIS — I6389 Other cerebral infarction: Secondary | ICD-10-CM | POA: Diagnosis not present

## 2022-03-28 DIAGNOSIS — R471 Dysarthria and anarthria: Secondary | ICD-10-CM | POA: Diagnosis present

## 2022-03-28 DIAGNOSIS — G8191 Hemiplegia, unspecified affecting right dominant side: Secondary | ICD-10-CM | POA: Diagnosis present

## 2022-03-28 DIAGNOSIS — R29810 Facial weakness: Secondary | ICD-10-CM | POA: Diagnosis present

## 2022-03-28 DIAGNOSIS — R1310 Dysphagia, unspecified: Secondary | ICD-10-CM | POA: Diagnosis present

## 2022-03-28 DIAGNOSIS — Z4682 Encounter for fitting and adjustment of non-vascular catheter: Secondary | ICD-10-CM | POA: Diagnosis not present

## 2022-03-28 DIAGNOSIS — R220 Localized swelling, mass and lump, head: Secondary | ICD-10-CM | POA: Diagnosis not present

## 2022-03-28 DIAGNOSIS — I609 Nontraumatic subarachnoid hemorrhage, unspecified: Secondary | ICD-10-CM | POA: Diagnosis not present

## 2022-03-28 DIAGNOSIS — I63412 Cerebral infarction due to embolism of left middle cerebral artery: Secondary | ICD-10-CM | POA: Diagnosis not present

## 2022-03-28 DIAGNOSIS — I69351 Hemiplegia and hemiparesis following cerebral infarction affecting right dominant side: Secondary | ICD-10-CM | POA: Diagnosis not present

## 2022-03-28 DIAGNOSIS — D649 Anemia, unspecified: Secondary | ICD-10-CM | POA: Diagnosis not present

## 2022-03-28 DIAGNOSIS — Z20822 Contact with and (suspected) exposure to covid-19: Secondary | ICD-10-CM | POA: Diagnosis present

## 2022-03-28 DIAGNOSIS — Z90710 Acquired absence of both cervix and uterus: Secondary | ICD-10-CM

## 2022-03-28 DIAGNOSIS — I639 Cerebral infarction, unspecified: Secondary | ICD-10-CM | POA: Diagnosis present

## 2022-03-28 DIAGNOSIS — Z823 Family history of stroke: Secondary | ICD-10-CM | POA: Diagnosis not present

## 2022-03-28 DIAGNOSIS — I4891 Unspecified atrial fibrillation: Secondary | ICD-10-CM | POA: Diagnosis not present

## 2022-03-28 DIAGNOSIS — Z885 Allergy status to narcotic agent status: Secondary | ICD-10-CM

## 2022-03-28 DIAGNOSIS — J9620 Acute and chronic respiratory failure, unspecified whether with hypoxia or hypercapnia: Secondary | ICD-10-CM | POA: Diagnosis not present

## 2022-03-28 DIAGNOSIS — I4719 Other supraventricular tachycardia: Secondary | ICD-10-CM | POA: Diagnosis not present

## 2022-03-28 DIAGNOSIS — H5789 Other specified disorders of eye and adnexa: Secondary | ICD-10-CM | POA: Diagnosis not present

## 2022-03-28 DIAGNOSIS — I63312 Cerebral infarction due to thrombosis of left middle cerebral artery: Secondary | ICD-10-CM | POA: Diagnosis not present

## 2022-03-28 DIAGNOSIS — E669 Obesity, unspecified: Secondary | ICD-10-CM | POA: Diagnosis not present

## 2022-03-28 DIAGNOSIS — I484 Atypical atrial flutter: Secondary | ICD-10-CM | POA: Diagnosis not present

## 2022-03-28 DIAGNOSIS — I471 Supraventricular tachycardia: Secondary | ICD-10-CM | POA: Diagnosis present

## 2022-03-28 DIAGNOSIS — I63512 Cerebral infarction due to unspecified occlusion or stenosis of left middle cerebral artery: Secondary | ICD-10-CM

## 2022-03-28 DIAGNOSIS — D72829 Elevated white blood cell count, unspecified: Secondary | ICD-10-CM | POA: Diagnosis not present

## 2022-03-28 DIAGNOSIS — J9589 Other postprocedural complications and disorders of respiratory system, not elsewhere classified: Secondary | ICD-10-CM

## 2022-03-28 DIAGNOSIS — R000 Tachycardia, unspecified: Secondary | ICD-10-CM | POA: Diagnosis not present

## 2022-03-28 DIAGNOSIS — Y848 Other medical procedures as the cause of abnormal reaction of the patient, or of later complication, without mention of misadventure at the time of the procedure: Secondary | ICD-10-CM | POA: Diagnosis not present

## 2022-03-28 DIAGNOSIS — Z79899 Other long term (current) drug therapy: Secondary | ICD-10-CM

## 2022-03-28 DIAGNOSIS — G936 Cerebral edema: Secondary | ICD-10-CM | POA: Diagnosis not present

## 2022-03-28 DIAGNOSIS — E7800 Pure hypercholesterolemia, unspecified: Secondary | ICD-10-CM | POA: Diagnosis not present

## 2022-03-28 DIAGNOSIS — R21 Rash and other nonspecific skin eruption: Secondary | ICD-10-CM | POA: Diagnosis not present

## 2022-03-28 DIAGNOSIS — E1165 Type 2 diabetes mellitus with hyperglycemia: Secondary | ICD-10-CM | POA: Diagnosis not present

## 2022-03-28 DIAGNOSIS — I517 Cardiomegaly: Secondary | ICD-10-CM | POA: Diagnosis not present

## 2022-03-28 DIAGNOSIS — D62 Acute posthemorrhagic anemia: Secondary | ICD-10-CM | POA: Diagnosis not present

## 2022-03-28 DIAGNOSIS — J9622 Acute and chronic respiratory failure with hypercapnia: Secondary | ICD-10-CM | POA: Diagnosis not present

## 2022-03-28 DIAGNOSIS — R29723 NIHSS score 23: Secondary | ICD-10-CM | POA: Diagnosis present

## 2022-03-28 DIAGNOSIS — Z90722 Acquired absence of ovaries, bilateral: Secondary | ICD-10-CM

## 2022-03-28 DIAGNOSIS — I69398 Other sequelae of cerebral infarction: Secondary | ICD-10-CM | POA: Diagnosis not present

## 2022-03-28 DIAGNOSIS — R29818 Other symptoms and signs involving the nervous system: Secondary | ICD-10-CM | POA: Diagnosis not present

## 2022-03-28 DIAGNOSIS — J9621 Acute and chronic respiratory failure with hypoxia: Secondary | ICD-10-CM | POA: Diagnosis not present

## 2022-03-28 HISTORY — PX: IR PERCUTANEOUS ART THROMBECTOMY/INFUSION INTRACRANIAL INC DIAG ANGIO: IMG6087

## 2022-03-28 HISTORY — PX: RADIOLOGY WITH ANESTHESIA: SHX6223

## 2022-03-28 HISTORY — PX: IR US GUIDE VASC ACCESS RIGHT: IMG2390

## 2022-03-28 HISTORY — PX: IR 3D INDEPENDENT WKST: IMG2385

## 2022-03-28 LAB — CBC
HCT: 47 % — ABNORMAL HIGH (ref 36.0–46.0)
Hemoglobin: 15.1 g/dL — ABNORMAL HIGH (ref 12.0–15.0)
MCH: 27.8 pg (ref 26.0–34.0)
MCHC: 32.1 g/dL (ref 30.0–36.0)
MCV: 86.6 fL (ref 80.0–100.0)
Platelets: 298 10*3/uL (ref 150–400)
RBC: 5.43 MIL/uL — ABNORMAL HIGH (ref 3.87–5.11)
RDW: 14.5 % (ref 11.5–15.5)
WBC: 7.3 10*3/uL (ref 4.0–10.5)
nRBC: 0 % (ref 0.0–0.2)

## 2022-03-28 LAB — COMPREHENSIVE METABOLIC PANEL
ALT: 21 U/L (ref 0–44)
AST: 28 U/L (ref 15–41)
Albumin: 3.7 g/dL (ref 3.5–5.0)
Alkaline Phosphatase: 74 U/L (ref 38–126)
Anion gap: 11 (ref 5–15)
BUN: 12 mg/dL (ref 8–23)
CO2: 23 mmol/L (ref 22–32)
Calcium: 9.5 mg/dL (ref 8.9–10.3)
Chloride: 105 mmol/L (ref 98–111)
Creatinine, Ser: 0.86 mg/dL (ref 0.44–1.00)
GFR, Estimated: >60 mL/min (ref >60)
Glucose, Bld: 144 mg/dL — ABNORMAL HIGH (ref 70–99)
Potassium: 4.4 mmol/L (ref 3.5–5.1)
Sodium: 139 mmol/L (ref 135–145)
Total Bilirubin: 0.8 mg/dL (ref 0.3–1.2)
Total Protein: 7.5 g/dL (ref 6.5–8.1)

## 2022-03-28 LAB — POCT I-STAT 7, (LYTES, BLD GAS, ICA,H+H)
Acid-Base Excess: 0 mmol/L (ref 0.0–2.0)
Bicarbonate: 24.8 mmol/L (ref 20.0–28.0)
Calcium, Ion: 1.2 mmol/L (ref 1.15–1.40)
HCT: 45 % (ref 36.0–46.0)
Hemoglobin: 15.3 g/dL — ABNORMAL HIGH (ref 12.0–15.0)
O2 Saturation: 99 %
Patient temperature: 93
Potassium: 3.8 mmol/L (ref 3.5–5.1)
Sodium: 137 mmol/L (ref 135–145)
TCO2: 26 mmol/L (ref 22–32)
pCO2 arterial: 34.4 mmHg (ref 32–48)
pH, Arterial: 7.453 — ABNORMAL HIGH (ref 7.35–7.45)
pO2, Arterial: 134 mmHg — ABNORMAL HIGH (ref 83–108)

## 2022-03-28 LAB — DIFFERENTIAL
Abs Immature Granulocytes: 0.03 10*3/uL (ref 0.00–0.07)
Basophils Absolute: 0 10*3/uL (ref 0.0–0.1)
Basophils Relative: 0 %
Eosinophils Absolute: 0.1 10*3/uL (ref 0.0–0.5)
Eosinophils Relative: 1 %
Immature Granulocytes: 0 %
Lymphocytes Relative: 20 %
Lymphs Abs: 1.5 10*3/uL (ref 0.7–4.0)
Monocytes Absolute: 0.3 10*3/uL (ref 0.1–1.0)
Monocytes Relative: 5 %
Neutro Abs: 5.4 10*3/uL (ref 1.7–7.7)
Neutrophils Relative %: 74 %

## 2022-03-28 LAB — I-STAT CHEM 8, ED
BUN: 13 mg/dL (ref 8–23)
Calcium, Ion: 1.17 mmol/L (ref 1.15–1.40)
Chloride: 104 mmol/L (ref 98–111)
Creatinine, Ser: 0.7 mg/dL (ref 0.44–1.00)
Glucose, Bld: 144 mg/dL — ABNORMAL HIGH (ref 70–99)
HCT: 46 % (ref 36.0–46.0)
Hemoglobin: 15.6 g/dL — ABNORMAL HIGH (ref 12.0–15.0)
Potassium: 4.4 mmol/L (ref 3.5–5.1)
Sodium: 139 mmol/L (ref 135–145)
TCO2: 26 mmol/L (ref 22–32)

## 2022-03-28 LAB — PROTIME-INR
INR: 1 (ref 0.8–1.2)
Prothrombin Time: 13.1 seconds (ref 11.4–15.2)

## 2022-03-28 LAB — SARS CORONAVIRUS 2 BY RT PCR: SARS Coronavirus 2 by RT PCR: NEGATIVE

## 2022-03-28 LAB — ETHANOL: Alcohol, Ethyl (B): <10 mg/dL (ref <10)

## 2022-03-28 LAB — APTT: aPTT: 34 seconds (ref 24–36)

## 2022-03-28 LAB — CBG MONITORING, ED: Glucose-Capillary: 149 mg/dL — ABNORMAL HIGH (ref 70–99)

## 2022-03-28 LAB — HEMOGLOBIN A1C
Hgb A1c MFr Bld: 5.5 % (ref 4.8–5.6)
Mean Plasma Glucose: 111.15 mg/dL

## 2022-03-28 SURGERY — IR WITH ANESTHESIA
Anesthesia: General

## 2022-03-28 MED ORDER — SODIUM CHLORIDE 0.9 % IV SOLN: INTRAVENOUS | Status: DC

## 2022-03-28 MED ORDER — PROPOFOL 1000 MG/100ML IV EMUL
0.0000 ug/kg/min | INTRAVENOUS | Status: DC
  Administered 2022-03-28: 40 ug/kg/min via INTRAVENOUS
  Administered 2022-03-29: 10 ug/kg/min via INTRAVENOUS
  Administered 2022-03-29: 35 ug/kg/min via INTRAVENOUS
  Filled 2022-03-28 (×3): qty 100

## 2022-03-28 MED ORDER — ONDANSETRON HCL 4 MG/2ML IJ SOLN
INTRAMUSCULAR | Status: DC | PRN
  Administered 2022-03-28: 4 mg via INTRAVENOUS

## 2022-03-28 MED ORDER — CLEVIDIPINE BUTYRATE 0.5 MG/ML IV EMUL: 0.0000 mg/h | INTRAVENOUS | Status: DC

## 2022-03-28 MED ORDER — SODIUM CHLORIDE 0.9% FLUSH: 3.0000 mL | Freq: Once | INTRAVENOUS | Status: DC

## 2022-03-28 MED ORDER — STROKE: EARLY STAGES OF RECOVERY BOOK
Freq: Once | Status: AC
  Filled 2022-03-28: qty 1

## 2022-03-28 MED ORDER — PROPOFOL 1000 MG/100ML IV EMUL
INTRAVENOUS | Status: AC
  Administered 2022-03-28: 40 ug/kg/min via INTRAVENOUS
  Filled 2022-03-28: qty 100

## 2022-03-28 MED ORDER — FENTANYL CITRATE PF 50 MCG/ML IJ SOSY: 25.0000 ug | PREFILLED_SYRINGE | INTRAMUSCULAR | Status: DC | PRN

## 2022-03-28 MED ORDER — PANTOPRAZOLE SODIUM 40 MG IV SOLR
40.0000 mg | Freq: Every day | INTRAVENOUS | Status: DC
  Administered 2022-03-28 – 2022-03-30 (×3): 40 mg via INTRAVENOUS
  Filled 2022-03-28 (×2): qty 10

## 2022-03-28 MED ORDER — SUCCINYLCHOLINE CHLORIDE 200 MG/10ML IV SOSY
PREFILLED_SYRINGE | INTRAVENOUS | Status: DC | PRN
  Administered 2022-03-28: 100 mg via INTRAVENOUS

## 2022-03-28 MED ORDER — IOHEXOL 300 MG/ML  SOLN
100.0000 mL | Freq: Once | INTRAMUSCULAR | Status: AC | PRN
  Administered 2022-03-28: 75 mL via INTRA_ARTERIAL

## 2022-03-28 MED ORDER — SODIUM CHLORIDE 0.9 % IV SOLN: INTRAVENOUS | Status: DC | PRN

## 2022-03-28 MED ORDER — PROPOFOL 500 MG/50ML IV EMUL
INTRAVENOUS | Status: DC | PRN
  Administered 2022-03-28: 75 ug/kg/min via INTRAVENOUS

## 2022-03-28 MED ORDER — LABETALOL HCL 5 MG/ML IV SOLN: 20.0000 mg | Freq: Once | INTRAVENOUS | Status: DC

## 2022-03-28 MED ORDER — ROCURONIUM BROMIDE 10 MG/ML (PF) SYRINGE
PREFILLED_SYRINGE | INTRAVENOUS | Status: DC | PRN
  Administered 2022-03-28: 40 mg via INTRAVENOUS
  Administered 2022-03-28: 10 mg via INTRAVENOUS
  Administered 2022-03-28: 40 mg via INTRAVENOUS
  Administered 2022-03-28: 10 mg via INTRAVENOUS

## 2022-03-28 MED ORDER — LIDOCAINE HCL 1 % IJ SOLN
INTRAMUSCULAR | Status: AC
  Filled 2022-03-28: qty 20

## 2022-03-28 MED ORDER — CLEVIDIPINE BUTYRATE 0.5 MG/ML IV EMUL
0.0000 mg/h | INTRAVENOUS | Status: DC
  Administered 2022-03-28: 4 mg/h via INTRAVENOUS
  Administered 2022-03-28: 2 mg/h via INTRAVENOUS
  Administered 2022-03-29: 12 mg/h via INTRAVENOUS
  Administered 2022-03-29: 11 mg/h via INTRAVENOUS
  Administered 2022-03-29: 20 mg/h via INTRAVENOUS
  Administered 2022-03-29: 21 mg/h via INTRAVENOUS
  Administered 2022-03-29: 12 mg/h via INTRAVENOUS
  Administered 2022-03-29: 11 mg/h via INTRAVENOUS
  Administered 2022-03-29: 20 mg/h via INTRAVENOUS
  Administered 2022-03-29: 18 mg/h via INTRAVENOUS
  Administered 2022-03-29: 19 mg/h via INTRAVENOUS
  Administered 2022-03-29: 11 mg/h via INTRAVENOUS
  Administered 2022-03-29: 12 mg/h via INTRAVENOUS
  Administered 2022-03-30 – 2022-03-31 (×2): 2 mg/h via INTRAVENOUS
  Filled 2022-03-28: qty 50
  Filled 2022-03-28 (×2): qty 100
  Filled 2022-03-28: qty 50
  Filled 2022-03-28: qty 100
  Filled 2022-03-28: qty 50
  Filled 2022-03-28: qty 100
  Filled 2022-03-28 (×8): qty 50

## 2022-03-28 MED ORDER — PROPOFOL 10 MG/ML IV BOLUS
INTRAVENOUS | Status: DC | PRN
  Administered 2022-03-28: 120 mg via INTRAVENOUS

## 2022-03-28 MED ORDER — CARVEDILOL 12.5 MG PO TABS: 25.0000 mg | ORAL_TABLET | Freq: Two times a day (BID) | ORAL | Status: DC

## 2022-03-28 MED ORDER — LACTATED RINGERS IV SOLN: INTRAVENOUS | Status: DC | PRN

## 2022-03-28 MED ORDER — LABETALOL HCL 5 MG/ML IV SOLN
10.0000 mg | Freq: Once | INTRAVENOUS | Status: AC
  Administered 2022-03-28: 10 mg via INTRAVENOUS

## 2022-03-28 MED ORDER — DOCUSATE SODIUM 50 MG/5ML PO LIQD
100.0000 mg | Freq: Two times a day (BID) | ORAL | Status: DC
  Administered 2022-03-28 – 2022-03-29 (×2): 100 mg
  Filled 2022-03-28: qty 10

## 2022-03-28 MED ORDER — CLEVIDIPINE BUTYRATE 0.5 MG/ML IV EMUL
INTRAVENOUS | Status: AC
  Administered 2022-03-28: 10 mg/h via INTRAVENOUS
  Filled 2022-03-28: qty 50

## 2022-03-28 MED ORDER — LIDOCAINE 2% (20 MG/ML) 5 ML SYRINGE
INTRAMUSCULAR | Status: DC | PRN
  Administered 2022-03-28: 100 mg via INTRAVENOUS

## 2022-03-28 MED ORDER — IOHEXOL 300 MG/ML  SOLN
150.0000 mL | Freq: Once | INTRAMUSCULAR | Status: AC | PRN
  Administered 2022-03-28: 75 mL via INTRA_ARTERIAL

## 2022-03-28 MED ORDER — PHENYLEPHRINE HCL-NACL 20-0.9 MG/250ML-% IV SOLN
INTRAVENOUS | Status: DC | PRN
  Administered 2022-03-28: 15 ug/min via INTRAVENOUS

## 2022-03-28 MED ORDER — LABETALOL HCL 5 MG/ML IV SOLN
10.0000 mg | Freq: Once | INTRAVENOUS | Status: AC
  Administered 2022-03-29: 10 mg via INTRAVENOUS
  Filled 2022-03-28: qty 4

## 2022-03-28 MED ORDER — TENECTEPLASE FOR STROKE
0.2500 mg/kg | PACK | Freq: Once | INTRAVENOUS | Status: AC
  Administered 2022-03-28: 22 mg via INTRAVENOUS
  Filled 2022-03-28: qty 10

## 2022-03-28 MED ORDER — PHENYLEPHRINE 80 MCG/ML (10ML) SYRINGE FOR IV PUSH (FOR BLOOD PRESSURE SUPPORT)
PREFILLED_SYRINGE | INTRAVENOUS | Status: DC | PRN
  Administered 2022-03-28: 80 ug via INTRAVENOUS
  Administered 2022-03-28 (×2): 40 ug via INTRAVENOUS
  Administered 2022-03-28 (×3): 80 ug via INTRAVENOUS
  Administered 2022-03-28 (×2): 40 ug via INTRAVENOUS
  Administered 2022-03-28: 80 ug via INTRAVENOUS

## 2022-03-28 MED ORDER — VERAPAMIL HCL 2.5 MG/ML IV SOLN
INTRAVENOUS | Status: AC
  Filled 2022-03-28: qty 4

## 2022-03-28 MED ORDER — ORAL CARE MOUTH RINSE: 15.0000 mL | OROMUCOSAL | Status: DC | PRN

## 2022-03-28 MED ORDER — DEXAMETHASONE SODIUM PHOSPHATE 10 MG/ML IJ SOLN
INTRAMUSCULAR | Status: DC | PRN
  Administered 2022-03-28: 5 mg via INTRAVENOUS

## 2022-03-28 MED ORDER — IOHEXOL 350 MG/ML SOLN
80.0000 mL | Freq: Once | INTRAVENOUS | Status: AC | PRN
  Administered 2022-03-28: 80 mL via INTRAVENOUS

## 2022-03-28 MED ORDER — POLYETHYLENE GLYCOL 3350 17 G PO PACK
17.0000 g | PACK | Freq: Every day | ORAL | Status: DC
  Administered 2022-03-29: 17 g
  Filled 2022-03-28: qty 1

## 2022-03-28 MED ORDER — SENNOSIDES-DOCUSATE SODIUM 8.6-50 MG PO TABS
1.0000 | ORAL_TABLET | Freq: Every evening | ORAL | Status: DC | PRN
  Administered 2022-04-03: 1
  Filled 2022-03-28: qty 1

## 2022-03-28 MED ORDER — SENNOSIDES-DOCUSATE SODIUM 8.6-50 MG PO TABS: 1.0000 | ORAL_TABLET | Freq: Every evening | ORAL | Status: DC | PRN

## 2022-03-28 MED ORDER — ORAL CARE MOUTH RINSE
15.0000 mL | OROMUCOSAL | Status: DC
  Administered 2022-03-28 – 2022-03-29 (×13): 15 mL via OROMUCOSAL

## 2022-03-28 NOTE — Anesthesia Procedure Notes (Addendum)
Procedure Name: Intubation Date/Time: 03/28/2022 1:52 PM  Performed by: Reece Agar, CRNAPre-anesthesia Checklist: Patient identified, Emergency Drugs available, Suction available and Patient being monitored Patient Re-evaluated:Patient Re-evaluated prior to induction Oxygen Delivery Method: Circle System Utilized Preoxygenation: Pre-oxygenation with 100% oxygen Induction Type: IV induction, Rapid sequence and Cricoid Pressure applied Ventilation: Mask ventilation without difficulty Laryngoscope Size: Mac and 4 Grade View: Grade II Tube type: Oral Tube size: 7.5 mm Number of attempts: 1 Airway Equipment and Method: Stylet Placement Confirmation: ETT inserted through vocal cords under direct vision, positive ETCO2 and breath sounds checked- equal and bilateral Secured at: 21 cm Tube secured with: Tape Dental Injury: Teeth and Oropharynx as per pre-operative assessment  Comments: Intubated by [place] under supervision of CRNA and MDA

## 2022-03-28 NOTE — Consult Note (Signed)
NAME:  Elaine Ford, MRN:  [id number], DOB:  1945/12/06, LOS: 0 ADMISSION DATE:  03/28/2022, CONSULTATION DATE:  9/18 REFERRING MD:  Dr. Cheral Marker, CHIEF COMPLAINT:  CVA   History of Present Illness:  Patient is encephalopathic and/or intubated. Therefore history has been obtained from chart review.  76 year old female with past medical history as below, which is significant for diabetes, hypertension, hyperlipidemia, followed by block status post pacemaker, and hypotension.  She was in her usual state of health until 10 AM on 9/18.  She presented most emergency department that same day at approximately 1:00 PM with complaints of acute onset right hemiplegia and aphasia.  Her husband found her slumped over to the right on the couch at home.  Upon arrival to the ED she was confirmed to be aphasic with severe right-sided weakness.  Exam was concerning for left MCA territory infarct.  CT of the head demonstrated hypodensity in the left MCA without hemorrhage.  The patient was administered systemic TNK.  CT angiogram and perfusion deemed the patient a favorable candidate for mechanical thrombectomy.  She was taken to IR where she underwent mechanical thrombectomy of proximal left M1/MCA occlusion with a TICI 3 result.  Postoperative CT did demonstrate small subarachnoid hemorrhage.  She was transferred to the ICU on mechanical ventilator for recovery.   Pertinent  Medical History   has a past medical history of Back pain, Broken arm, Gall bladder stones, Gestational diabetes, Hemorrhoid, cardiac cath, cardiovascular stress test, echocardiogram, Hyperlipidemia, Hypertension, LBBB (left bundle branch block), Low blood pressure, Nerve damage, Pain, S/P cardiac pacemaker procedure (07/2014), and Stroke [facility]).   Significant Hospital Events: Including procedures, antibiotic start and stop dates in addition to other pertinent events   9/18 admit for CVA.  Systemic TNK mechanical thrombectomy.  Post procedurally on  vent to ICU.  Interim History / Subjective:    Objective   Blood pressure (!) 153/105, pulse 67, temperature 97.9 F (36.6 C), temperature source Oral, resp. rate (!) 22, height '5\' 4"'$  (1.626 m), weight 88.8 kg, SpO2 100 %.    Vent Mode: PRVC FiO2 (%):  [60 %] 60 % Set Rate:  [16 bmp] 16 bmp Vt Set:  [430 mL] 430 mL PEEP:  [5 cmH20] 5 cmH20 Plateau Pressure:  [19 cmH20] 19 cmH20   Intake/Output Summary (Last 24 hours) at 03/28/2022 1656 Last data filed at 03/28/2022 1655 Gross per 24 hour  Intake 1500 ml  Output 1700 ml  Net -200 ml   Filed Weights   03/28/22 1200  Weight: 88.8 kg    Examination: General: Overweight elderly female in NAD. HENT: [place]/AT, PERRL, no JVD. Lungs: Clear bilateral breath sounds. Cardiovascular: RRR, no MRG. 2+ periphearl pulses in all extremities.  Abdomen: Soft, non-distended. Hypoactive.  Extremities: No acute deformity or edema.  Neuro: Deeply sedated  Resolved Hospital Problem list     Assessment & Plan:   Acute CVA left MCA, M1: s/p systemic TNK in ED as then mechanical thrombectomy with TICI 3. Complicated by postprocedural SAH.  SAH - ICU monitoring - Frequent neurochecks - repeat CT in 3 hours - SBP goal 120-140 mmHg. PRN cleviprex, phenylephrine - Management per neurology/IR - Echocardiogram - MRI if pacemaker is compatible.   ETT present - Full vent support - CXR, ABG pending - VAP bundle - Assess for SBT in the AM  Complete heart block: s/p pacemaker: paced on monitor. EKG unchanged.  - Telemetry monitoring  HTN:   - holding home carvedilol for now  Best Practice (right click and "Reselect all SmartList Selections" daily)   Diet/type: NPO DVT prophylaxis: not indicated GI prophylaxis: PPI Lines: N/A Foley:  N/A Code Status:  full code Last date of multidisciplinary goals of care discussion '[ ]'$   Labs   CBC: Recent Labs  Lab 03/28/22 1300  WBC 7.3  NEUTROABS 5.4  HGB 15.1*  15.6*  HCT 47.0*  46.0   MCV 86.6  PLT 086    Basic Metabolic Panel: Recent Labs  Lab 03/28/22 1300  NA 139  139  K 4.4  4.4  CL 105  104  CO2 23  GLUCOSE 144*  144*  BUN 12  13  CREATININE 0.86  0.70  CALCIUM 9.5   GFR: Estimated Creatinine Clearance: 60 mL/min (by C-G formula based on SCr of 0.86 mg/dL). Recent Labs  Lab 03/28/22 1300  WBC 7.3    Liver Function Tests: Recent Labs  Lab 03/28/22 1300  AST 28  ALT 21  ALKPHOS 74  BILITOT 0.8  PROT 7.5  ALBUMIN 3.7   No results for input(s): "LIPASE", "AMYLASE" in the last 168 hours. No results for input(s): "AMMONIA" in the last 168 hours.  ABG    Component Value Date/Time   TCO2 26 03/28/2022 1300     Coagulation Profile: Recent Labs  Lab 03/28/22 1300  INR 1.0    Cardiac Enzymes: No results for input(s): "CKTOTAL", "CKMB", "CKMBINDEX", "TROPONINI" in the last 168 hours.  HbA1C: Hemoglobin A1C  Date/Time Value Ref Range Status  07/22/2014 05:34 PM 5.6 4.2 - 6.3 % Final    Comment:    The American Diabetes Association recommends that a primary goal of therapy should be <7% and that physicians should reevaluate the treatment regimen in patients with HbA1c values consistently >8%.     CBG: Recent Labs  Lab 03/28/22 1254  GLUCAP 149*    Review of Systems:   Patient is encephalopathic and/or intubated. Therefore history has been obtained from chart review.    Past Medical History:  She,  has a past medical history of Back pain, Broken arm, Gall bladder stones, Gestational diabetes, Hemorrhoid, cardiac cath, cardiovascular stress test, echocardiogram, Hyperlipidemia, Hypertension, LBBB (left bundle branch block), Low blood pressure, Nerve damage, Pain, S/P cardiac pacemaker procedure (07/2014), and Stroke [facility]).   Surgical History:   Past Surgical History:  Procedure Laterality Date   COLONOSCOPY     DILATATION & CURETTAGE/HYSTEROSCOPY WITH MYOSURE N/A 02/29/2016   Procedure: DILATATION &  CURETTAGE/HYSTEROSCOPY WITH MYOSURE;  Surgeon: [name], DO;  Location: [facility];  Service: Gynecology;  Laterality: N/A;  patient has a pacemaker    [place]  04/20/2016   Procedure: LAPAROSCOPIC BILATERAL SALPINGO OOPHORECTOMY;  Surgeon: [name], DO;  Location: [facility];  Service: Gynecology;;   LEAD REVISION N/A 07/25/2014   Procedure: LEAD REVISION;  Surgeon: Deboraha Sprang, MD;  Location: [facility] CATH LAB;  Service: Cardiovascular;  Laterality: N/A;   PERMANENT PACEMAKER INSERTION N/A 07/24/2014   Procedure: PERMANENT PACEMAKER INSERTION;  Surgeon: [name], MD;  Location: [facility] CATH LAB;  Service: Cardiovascular;  Laterality: N/A;   VAGINAL HYSTERECTOMY  04/20/2016   Procedure: HYSTERECTOMY VAGINAL;  Surgeon: [name], DO;  Location: [facility];  Service: Gynecology;;     Social History:   reports that she has never smoked. She has never used smokeless tobacco. She reports that she does not drink alcohol and does not use drugs.   Family History:  Her family history includes Heart attack in her father; Heart disease in her brother; Stroke in her father.   Allergies Allergies  Allergen Reactions   Ace Inhibitors Cough   Codeine Other (See Comments)    Reaction:  Unknown    Hydrocodone-Acetaminophen Other (See Comments)    Reaction:  Unknown    Lisinopril-Hydrochlorothiazide Cough   Oxycodone-Acetaminophen Nausea And Vomiting     Home Medications  Prior to Admission medications   Medication Sig Start Date End Date Taking? Authorizing Provider  Calcium Carbonate-Vitamin D 600-400 MG-UNIT tablet Take 1 tablet by mouth daily.    [provider]  carvedilol (COREG) 25 MG tablet TAKE 1 TABLET (25 MG TOTAL) BY MOUTH 2 (TWO) TIMES DAILY. 09/01/16   [name], MD  ibuprofen (ADVIL,MOTRIN) 600 MG tablet Take 1 tablet (600 mg total) by mouth every 6 (six) hours as needed for moderate pain. 04/21/16   [name], DO  Multiple Vitamin (MULTIVITAMIN WITH MINERALS) TABS tablet Take 1 tablet by mouth daily.    [provider]  nitroGLYCERIN (NITROSTAT) 0.4 MG SL tablet Place 1 tablet (0.4 mg total) under the tongue every 5 (five) minutes as needed for chest pain. 09/13/21 12/12/21  [name], MD  traMADol (ULTRAM) 50 MG tablet Take 1 tablet (50 mg total) by mouth every 6 (six) hours as needed. 01/25/21   Jaynee Eagles, PA-C  VITAMIN E PO Take 1 capsule by mouth daily.     [provider]     Critical care time: 39 minutes     Georgann Housekeeper, AGACNP-BC [facility]  See Amion for personal pager PCCM on call pager [phone] until 7pm. Please call Elink 7p-7a. [phone]  03/28/2022 5:46 PM

## 2022-03-28 NOTE — Telephone Encounter (Signed)
Pt spouse wanted to let Dr. Lovena Le know that pt is in the hospital with stroke.

## 2022-03-28 NOTE — Consult Note (Deleted)
NEURO HOSPITALIST CONSULT NOTE   Requestig physician: Dr. Mayra Neer  Reason for Consult: Acute onset of aphasia and right sided weakness  History obtained from:  EMS and Chart     HPI:                                                                                                                                          Elaine Ford is an 76 y.o. female with a PMHx of gallstones, gestational diabetes, HLD, HTN LBBB s/p pacemaker placement and hypotension, presenting to the ED via EMS with acute onset of right hemiplegia and aphasia. LKN was 1000. She was found slumped over to the right on her couch at home by her husband. Husband states that during a work up for another condition brain imaging was performed, revealing an old stroke, but patient was never aware of any stroke-like symptoms in the past. She is not on a blood thinner.   On arrival to the ED, she continued to be aphasic, with dense right sided weakness.   Past Medical History:  Diagnosis Date   Back pain    Broken arm    age 11 years old broke right arm after a fall   Gall bladder stones    Gestational diabetes    Hemorrhoid    Hx of cardiac cath    [facility] 07/24/14 at [facility] - no significant CAD   Hx of cardiovascular stress test    Myoview 11/14:  No ischemia   Hx of echocardiogram    Echo 07/23/14 - at [facility]:  EF 65-70%, impaired relaxation, mild LAE, mild MR, mild AI, mild elevated PASP (44.2 mmHg)   Hyperlipidemia    Hypertension    LBBB (left bundle branch block)    Low blood pressure    Nerve damage    right arm   Pain    Left upper chest s/p pacer maker insertion site 07/2014   S/P cardiac pacemaker procedure 07/2014   [employer] - Dr. Lovena Le   Stroke [facility])    patient was unaware noted prior to pacermaker placement unknown when it occurred    Past Surgical History:  Procedure Laterality Date   COLONOSCOPY     DILATATION & CURETTAGE/HYSTEROSCOPY WITH MYOSURE N/A 02/29/2016   Procedure: DILATATION &  CURETTAGE/HYSTEROSCOPY WITH MYOSURE;  Surgeon: [name], DO;  Location: [facility];  Service: Gynecology;  Laterality: N/A;  patient has a pacemaker    [place]  04/20/2016   Procedure: LAPAROSCOPIC BILATERAL SALPINGO OOPHORECTOMY;  Surgeon: [name], DO;  Location: [facility];  Service: Gynecology;;   LEAD REVISION N/A 07/25/2014   Procedure: LEAD REVISION;  Surgeon: Deboraha Sprang, MD;  Location: [facility] CATH LAB;  Service: Cardiovascular;  Laterality: N/A;   PERMANENT PACEMAKER INSERTION N/A 07/24/2014   Procedure: PERMANENT PACEMAKER INSERTION;  Surgeon: [name], MD;  Location: [facility] CATH LAB;  Service: Cardiovascular;  Laterality: N/A;   VAGINAL HYSTERECTOMY  04/20/2016   Procedure: HYSTERECTOMY VAGINAL;  Surgeon: [name], DO;  Location: [facility];  Service: Gynecology;;    Family History  Problem Relation Age of Onset   Heart attack Father    Stroke Father    Heart disease Brother               Social History:  reports that she has never smoked. She has never used smokeless tobacco. She reports that she does not drink alcohol and does not use drugs.  Allergies  Allergen Reactions   Ace Inhibitors Cough   Codeine Other (See Comments)    Reaction:  Unknown    Hydrocodone-Acetaminophen Other (See Comments)    Reaction:  Unknown    Lisinopril-Hydrochlorothiazide Cough   Oxycodone-Acetaminophen Nausea And Vomiting    HOME MEDICATIONS:                                                                                                                      No current facility-administered medications on file prior to encounter.   Current Outpatient Medications on File Prior to Encounter  Medication Sig Dispense Refill   Calcium Carbonate-Vitamin D 600-400 MG-UNIT tablet Take 1 tablet by mouth daily.     carvedilol (COREG) 25 MG tablet TAKE 1 TABLET (25 MG TOTAL) BY MOUTH 2 (TWO) TIMES DAILY. 180 tablet 3   ibuprofen  (ADVIL,MOTRIN) 600 MG tablet Take 1 tablet (600 mg total) by mouth every 6 (six) hours as needed for moderate pain. 30 tablet 0   Multiple Vitamin (MULTIVITAMIN WITH MINERALS) TABS tablet Take 1 tablet by mouth daily.     nitroGLYCERIN (NITROSTAT) 0.4 MG SL tablet Place 1 tablet (0.4 mg total) under the tongue every 5 (five) minutes as needed for chest pain. 25 tablet 6   traMADol (ULTRAM) 50 MG tablet Take 1 tablet (50 mg total) by mouth every 6 (six) hours as needed. 15 tablet 0   VITAMIN E PO Take 1 capsule by mouth daily.       ROS:  Unable to obtain due to aphasia.    Height '5\' 4"'$  (1.626 m), weight 88.8 kg. BP (!) 143/71   Pulse 67   Temp 97.9 F (36.6 C) (Oral)   Resp 15   Ht '5\' 4"'$  (1.626 m)   Wt 88.8 kg   SpO2 93%   BMI 33.60 kg/m     General Examination:                                                                                                       Physical Exam  HEENT-  [place]/AT    Lungs- Respirations unlabored Extremities- Mild pretibial edema bilaterally  Neurological Examination Mental Status: Awake with mildly decreased LOC/decreased alertness. Speech severely dysarthric with unintelligible responses except for patient exclaiming a religious phrase once. Followed one simple motor command, otherwise not following any instructions. Right hemineglect.  Cranial Nerves: II: PERRL. No blink to threat on the right.  III,IV, VI: Leftward gaze deviation. Cannot track to the right. No nystagmus. V: Decreased responses to sensory stimuli on the right VII: Right facial droop. VIII: Unable to formally assess IX,X: Deferred gag reflex XI: Head is preferentially rotated to the left XII: Does not protrude tongue to command Motor/Sensory: RUE flaccid with no movement to noxious stimuli. Does grimace after a delay with repeated pinching of  right arm.  RLE 1-2/5 to noxious LUE and LLE 5/5 with brisk responses to noxious stimuli Sensory: Pinprick and light touch intact throughout, bilaterally Deep Tendon Reflexes: 2+ and symmetric throughout Plantars: Right: equivocal   Left: downgoing Cerebellar: No gross ataxia disproportionate to her weakness Gait: Unable to assess   Lab Results: Basic Metabolic Panel: Recent Labs  Lab 03/28/22 1300  NA 139  K 4.4  CL 104  GLUCOSE 144*  BUN 13  CREATININE 0.70    CBC: Recent Labs  Lab 03/28/22 1300  HGB 15.6*  HCT 46.0    Cardiac Enzymes: No results for input(s): "CKTOTAL", "CKMB", "CKMBINDEX", "TROPONINI" in the last 168 hours.  Lipid Panel: No results for input(s): "CHOL", "TRIG", "HDL", "CHOLHDL", "VLDL", "LDLCALC" in the last 168 hours.  Imaging: No results found.   Assessment: 76 y.o. female with a PMHx of gallstones, gestational diabetes, HLD, HTN LBBB s/p pacemaker placement and hypotension, presenting to the ED via EMS with acute onset of right hemiplegia and aphasia. LKN was 1000. She was found slumped over to the right on her couch at home by her husband. Husband states that during a work up for another condition brain imaging was performed, revealing an old stroke, but patient was never aware of any stroke-like symptoms in the past. She is not on a blood thinner. On arrival to the ED, she continued to be aphasic, with dense right sided weakness.  - Exam reveals deficits referable to her left cerebral hemisphere, most consistent with a large stroke involving the MCA territory.  - CT head: Hyperdensity in the left MCA near the bifurcation and in the proximal M2 branches, which may represent atherosclerotic disease and/or thrombus. ASPECTS is 10 - CTA of head and neck:  Occlusion of the left distal M1 with minimal flow in the proximal left M2 branches and poor perfusion of the remainder of the left MCA territory. This correlates with the hyperdensity seen on the  same-day CT head. Multifocal stenosis in the bilateral PCAs, which is severe at the origin of one of the left P3 branches, moderate in the proximal right P1 and throughout the P2, and mild in the left P2. Poor perfusion of the right P3 segments. Additional mild stenosis in the distal right M1, proximal M2, and in the left V4. Mild stenosis of the proximal right V1 segment.  - After comprehensive review of possible contraindications, she has no absolute contraindications to TNK administration. Patient is a TNK candidate. Discussed extensively the risks/benefits of TNK treatment vs. no treatment with the patient's husband, including risks of hemorrhage and death with TNK administration versus worse overall outcomes on average in patients within the IV thrombolytic time window who are not administered TNK. The patient's aphasia precludes meaningful medical decision making on her part at this time. Overall benefits of TNK regarding long-term prognosis are felt to outweigh risks. The patient's husband expressed understanding and wish to proceed with TNK. - The patient is a VIR candidate. Risks/benefits of the procedure were discussed extensively with patient's husband, including approximately 50% chance of significant improvement relative to an approximate 10% chance of subarachnoid hemorrhage with possibility of significant worsening including death. The patient's husband expressed understanding and provided informed consent to proceed with VIR. All questions answered. Consent form signed.  Recommendations: - Admitting to Neuro ICU.  - Post-TNK order set to include frequent neuro checks and BP management.  - No antiplatelet medications or anticoagulants for at least 24 hours following TNK.  - DVT prophylaxis with SCDs.  - Will need to be started on a statin. Obtain baseline CK level - Will to be started on antiplatelet therapy if follow up CT at 24 hours is negative for hemorrhagic conversion. - TTE.  - MRI  brain if possible given her pacemaker. Will need MRI techs to determine if her pacemaker ([place], Model # [id number]) is MRI compatible.  - PT/OT/Speech.  - NPO until passes swallow evaluation.  - Telemetry monitoring - Fasting lipid panel, HgbA1c  85 minutes spent in the emergent neurological evaluation and management of this critically ill patient.   Electronically signed: Dr. Kerney Elbe 03/28/2022, 1:03 PM

## 2022-03-28 NOTE — Progress Notes (Signed)
Patient transported from IR, to CT, and to room 4N23, on ventilatior, without complications.  Unit RTs aware of patient's arrival.  Will continue to monitor.

## 2022-03-28 NOTE — ED Triage Notes (Signed)
Pt BIB [facility] from home with R sided weakness, facial droop, slurred speech and not following commands. LKW 1000. Family called EMS at 1209. Pt minimally following commands and has R sided weakness and gaze aversion. EDP and neuro at bridge for assessment.

## 2022-03-28 NOTE — ED Provider Notes (Signed)
[facility] EMERGENCY DEPARTMENT Provider Note   CSN: [id number] Arrival date & time: 03/28/22  1251     History {Add pertinent medical, surgical, social history, OB history to HPI:1} No chief complaint on file.   Elaine Ford is a 76 y.o. female with *** presents with AMS, R sided deficits, code stroke.  Patient LKN 1000 AM this morning. Found by her family to be sitting up slumped on the couch, not responding.  POC glucose ***.  Initial blood pressure 440N systolic.    HPI     Home Medications Prior to Admission medications   Medication Sig Start Date End Date Taking? Authorizing Provider  Calcium Carbonate-Vitamin D 600-400 MG-UNIT tablet Take 1 tablet by mouth daily.    [provider]  carvedilol (COREG) 25 MG tablet TAKE 1 TABLET (25 MG TOTAL) BY MOUTH 2 (TWO) TIMES DAILY. 09/01/16   [name], MD  ibuprofen (ADVIL,MOTRIN) 600 MG tablet Take 1 tablet (600 mg total) by mouth every 6 (six) hours as needed for moderate pain. 04/21/16   [name], DO  Multiple Vitamin (MULTIVITAMIN WITH MINERALS) TABS tablet Take 1 tablet by mouth daily.    [provider]  nitroGLYCERIN (NITROSTAT) 0.4 MG SL tablet Place 1 tablet (0.4 mg total) under the tongue every 5 (five) minutes as needed for chest pain. 09/13/21 12/12/21  [name], MD  traMADol (ULTRAM) 50 MG tablet Take 1 tablet (50 mg total) by mouth every 6 (six) hours as needed. 01/25/21   Jaynee Eagles, PA-C  VITAMIN E PO Take 1 capsule by mouth daily.     [provider]      Allergies    Ace inhibitors, Codeine, Hydrocodone-acetaminophen, Lisinopril-hydrochlorothiazide, and Oxycodone-acetaminophen    Review of Systems   Review of Systems  Physical Exam Updated Vital Signs BP (!) 174/66   Pulse 66   Temp 97.9 F (36.6 C) (Oral)   Resp 15   Ht '5\' 4"'$  (1.626 m)   Wt 88.8 kg   SpO2 93%   BMI 33.60 kg/m  Physical Exam General: Awake, alert but sleepy female,  lying in bed.  HEENT: PERRLA, Sclera anicteric, MMM, trachea midline. Cardiology: RRR, no murmurs/rubs/gallops. BL radial and DP pulses equal bilaterally.  Resp: Normal respiratory rate and effort. CTAB, no wheezes, rhonchi, crackles.  Abd: Soft, non-tender, non-distended. No rebound tenderness or guarding.  GU: Deferred. MSK: No peripheral edema or signs of trauma. Extremities without deformity or TTP. No cyanosis or clubbing. Skin: warm, dry. No rashes or lesions. Back: No CVA tenderness Neuro: A&Ox4, CNs II-XII grossly intact. MAEs. Sensation grossly intact.  Psych: Normal mood and affect.   1a  Level of consciousness: {consciousness neuro:31423}  1b. LOC questions:  {loc commands neuro:31401}  1c. LOC commands: {loc commands neuro:31401}  2.  Best Gaze: {best gaze neuro:31402}  3.  Visual: {visual neuro:31403}  4. Facial Palsy: {facial palsy:31404}  5a.  Motor left arm: {motor neuro:31405}  5b.  Motor right arm: {motor neuro:31405}  6a. motor left leg: {motor neuro:31405}  6b  Motor right leg:  {motor neuro:31405}  7. Limb Ataxia: {limb ataxia neuro:31406}  8.  Sensory: {sensory neuro:31407}  9. Best Language:  {best language neuro:31408}  10. Dysarthria: {dysarthria neuro:31409}  11. Extinction and Inattention: {extinction neuro:31410}   Total:   ***        ED Results / Procedures / Treatments   Labs (all labs ordered are listed, but only abnormal results are displayed) Labs  Reviewed  CBC - Abnormal; Notable for the following components:      Result Value   RBC 5.43 (*)    Hemoglobin 15.1 (*)    HCT 47.0 (*)    All other components within normal limits  I-STAT CHEM 8, ED - Abnormal; Notable for the following components:   Glucose, Bld 144 (*)    Hemoglobin 15.6 (*)    All other components within normal limits  CBG MONITORING, ED - Abnormal; Notable for the following components:   Glucose-Capillary 149 (*)    All other components within normal limits  PROTIME-INR   APTT  DIFFERENTIAL  COMPREHENSIVE METABOLIC PANEL  ETHANOL    EKG None  Radiology CT HEAD CODE STROKE WO CONTRAST  Result Date: 03/28/2022 CLINICAL DATA:  Code stroke. EXAM: CT HEAD WITHOUT CONTRAST TECHNIQUE: Contiguous axial images were obtained from the base of the skull through the vertex without intravenous contrast. RADIATION DOSE REDUCTION: This exam was performed according to the departmental dose-optimization program which includes automated exposure control, adjustment of the mA and/or kV according to patient size and/or use of iterative reconstruction technique. COMPARISON:  None Available. FINDINGS: Brain: No evidence of acute infarction, hemorrhage, hydrocephalus, extra-axial collection or mass lesion/mass effect. Vascular: Hyperdensity in the left MCA, near the bifurcation and in the proximal M2 branches (series 5, image 25-27), which may represent atherosclerotic disease and/or thrombus. Skull: Normal. Negative for fracture or focal lesion. Sinuses/Orbits: No acute finding. Other: The mastoids are well aerated. ASPECTS [facility] Stroke Program Early CT Score) - Ganglionic level infarction (caudate, lentiform nuclei, internal capsule, insula, M1-M3 cortex): 7 - Supraganglionic infarction (M4-M6 cortex): 3 Total score (0-10 with 10 being normal): 10 IMPRESSION: 1. Hyperdensity in the left MCA near the bifurcation and in the proximal M2 branches, which may represent atherosclerotic disease and/or thrombus. 2. ASPECTS is 10 Code stroke imaging results were communicated on 03/28/2022 at 1:14 pm to provider Dr. Cheral Marker via telephone, who verbally acknowledged these results. Electronically Signed   By: Merilyn Baba M.D.   On: 03/28/2022 13:17    Procedures Procedures  {Document cardiac monitor, telemetry assessment procedure when appropriate:1}  Medications Ordered in ED Medications  sodium chloride flush (NS) 0.9 % injection 3 mL (has no administration in time range)  tenecteplase  (TNKASE) injection for Stroke 22 mg (has no administration in time range)  clevidipine (CLEVIPREX) 0.5 MG/ML infusion (has no administration in time range)  labetalol (NORMODYNE) injection 10 mg (has no administration in time range)    And  clevidipine (CLEVIPREX) infusion 0.5 mg/mL (2 mg/hr Intravenous New Bag/Given 03/28/22 1319)  labetalol (NORMODYNE) injection 10 mg (10 mg Intravenous Given 03/28/22 1308)    ED Course/ Medical Decision Making/ A&P                          Medical Decision Making   '@HNNMDM'$ @ *** Given the acute onset of neurological symptoms, stroke is the most concerning etiology of these acute symptoms. The neuro exam is significant for ***.   Neurology team evaluating patient at bedside. Plan to obtain emergent CT brain.  LKN: *** Glucose: *** AC: *** BP: ***   Plan: - stat head CT, consider additional imaging including CTA and perfusion scan pending initial CT  - neurology has been consulted  - consider tPA if neg head CT - Manage hypertension as needed -- sBP goal 140-160*** if ICH  -- if tPA given, will keep sBP <180 and dBP<105 -  labs & other orders as below   I have personally reviewed and interpreted all labs and imaging.   Clinical Course as of 03/28/22 1353  Mon Mar 28, 2022  1327 Patient's husband at bedside was consented by neurology for TNK administration as well as possible thrombectomy. [HN]  5188 Patient to IR for thrombectomy [HN]    Clinical Course User Index [HN] Audley Hose, MD    {Document critical care time when appropriate:1} {Document review of labs and clinical decision tools ie heart score, Chads2Vasc2 etc:1}  {Document your independent review of radiology images, and any outside records:1} {Document your discussion with family members, caretakers, and with consultants:1} {Document social determinants of health affecting pt's care:1} {Document your decision making why or why not admission, treatments were  needed:1} Final Clinical Impression(s) / ED Diagnoses Final diagnoses:  None    Rx / DC Orders ED Discharge Orders     None        This note was created using dictation software, which may contain spelling or grammatical errors.

## 2022-03-28 NOTE — H&P (Addendum)
NEUROHOSPITALIST ADMISSION HISTORY AND PHYSICAL   Requestig physician: Dr. Mayra Neer  Reason for Consult: Acute onset of aphasia and right sided weakness  History obtained from:  EMS and Chart     HPI:                                                                                                                                          Elaine Ford is an 76 y.o. female with a PMHx of gallstones, gestational diabetes, HLD, HTN LBBB s/p pacemaker placement and hypotension, presenting to the ED via EMS with acute onset of right hemiplegia and aphasia. LKN was 1000. She was found slumped over to the right on her couch at home by her husband. Husband states that during a work up for another condition brain imaging was performed, revealing an old stroke, but patient was never aware of any stroke-like symptoms in the past. She is not on a blood thinner.   On arrival to the ED, she continued to be aphasic, with dense right sided weakness.   Past Medical History:  Diagnosis Date   Back pain    Broken arm    age 89 years old broke right arm after a fall   Gall bladder stones    Gestational diabetes    Hemorrhoid    Hx of cardiac cath    [facility] 07/24/14 at [facility] - no significant CAD   Hx of cardiovascular stress test    Myoview 11/14:  No ischemia   Hx of echocardiogram    Echo 07/23/14 - at [facility]:  EF 65-70%, impaired relaxation, mild LAE, mild MR, mild AI, mild elevated PASP (44.2 mmHg)   Hyperlipidemia    Hypertension    LBBB (left bundle branch block)    Low blood pressure    Nerve damage    right arm   Pain    Left upper chest s/p pacer maker insertion site 07/2014   S/P cardiac pacemaker procedure 07/2014   [employer] - Dr. Lovena Le   Stroke [facility])    patient was unaware noted prior to pacermaker placement unknown when it occurred    Past Surgical History:  Procedure Laterality Date   COLONOSCOPY     DILATATION & CURETTAGE/HYSTEROSCOPY WITH MYOSURE N/A 02/29/2016    Procedure: DILATATION & CURETTAGE/HYSTEROSCOPY WITH MYOSURE;  Surgeon: [name], DO;  Location: [facility];  Service: Gynecology;  Laterality: N/A;  patient has a pacemaker    [place]  04/20/2016   Procedure: LAPAROSCOPIC BILATERAL SALPINGO OOPHORECTOMY;  Surgeon: [name], DO;  Location: [facility];  Service: Gynecology;;   LEAD REVISION N/A 07/25/2014   Procedure: LEAD REVISION;  Surgeon: Deboraha Sprang, MD;  Location: [facility] C[facility] LAB;  Service: Cardiovascular;  Laterality: N/A;   PERMANENT PACEMAKER INSERTION N/A 07/24/2014   Procedure: PERMANENT PACEMAKER INSERTION;  Surgeon: [name], MD;  Location: [facility] CATH LAB;  Service: Cardiovascular;  Laterality: N/A;   VAGINAL HYSTERECTOMY  04/20/2016   Procedure: HYSTERECTOMY VAGINAL;  Surgeon: [name], DO;  Location: [facility];  Service: Gynecology;;    Family History  Problem Relation Age of Onset   Heart attack Father    Stroke Father    Heart disease Brother               Social History:  reports that she has never smoked. She has never used smokeless tobacco. She reports that she does not drink alcohol and does not use drugs.  Allergies  Allergen Reactions   Ace Inhibitors Cough   Codeine Other (See Comments)    Reaction:  Unknown    Hydrocodone-Acetaminophen Other (See Comments)    Reaction:  Unknown    Lisinopril-Hydrochlorothiazide Cough   Oxycodone-Acetaminophen Nausea And Vomiting    HOME MEDICATIONS:                                                                                                                      No current facility-administered medications on file prior to encounter.   Current Outpatient Medications on File Prior to Encounter  Medication Sig Dispense Refill   Calcium Carbonate-Vitamin D 600-400 MG-UNIT tablet Take 1 tablet by mouth daily.     carvedilol (COREG) 25 MG tablet TAKE 1 TABLET (25 MG TOTAL) BY MOUTH 2 (TWO) TIMES DAILY. 180  tablet 3   ibuprofen (ADVIL,MOTRIN) 600 MG tablet Take 1 tablet (600 mg total) by mouth every 6 (six) hours as needed for moderate pain. 30 tablet 0   Multiple Vitamin (MULTIVITAMIN WITH MINERALS) TABS tablet Take 1 tablet by mouth daily.     nitroGLYCERIN (NITROSTAT) 0.4 MG SL tablet Place 1 tablet (0.4 mg total) under the tongue every 5 (five) minutes as needed for chest pain. 25 tablet 6   traMADol (ULTRAM) 50 MG tablet Take 1 tablet (50 mg total) by mouth every 6 (six) hours as needed. 15 tablet 0   VITAMIN E PO Take 1 capsule by mouth daily.       ROS:  Unable to obtain due to aphasia.    Blood pressure (!) 143/71, pulse 67, temperature 97.9 F (36.6 C), temperature source Oral, resp. rate 15, height '5\' 4"'$  (1.626 m), weight 88.8 kg, SpO2 93 %. BP (!) 143/71   Pulse 67   Temp 97.9 F (36.6 C) (Oral)   Resp 15   Ht '5\' 4"'$  (1.626 m)   Wt 88.8 kg   SpO2 93%   BMI 33.60 kg/m     General Examination:                                                                                                       Physical Exam  HEENT-  [place]/AT    Lungs- Respirations unlabored Extremities- Mild pretibial edema bilaterally  Neurological Examination Mental Status: Awake with mildly decreased LOC/decreased alertness. Speech severely dysarthric with unintelligible responses except for patient exclaiming a religious phrase once. Followed one simple motor command, otherwise not following any instructions. Right hemineglect.  Cranial Nerves: II: PERRL. No blink to threat on the right.  III,IV, VI: Leftward gaze deviation. Cannot track to the right. No nystagmus. V: Decreased responses to sensory stimuli on the right VII: Right facial droop. VIII: Unable to formally assess IX,X: Deferred gag reflex XI: Head is preferentially rotated to the left XII: Does not protrude  tongue to command Motor/Sensory: RUE flaccid with no movement to noxious stimuli. Does grimace after a delay with repeated pinching of right arm.  RLE 1-2/5 to noxious LUE and LLE 5/5 with brisk responses to noxious stimuli Sensory: Pinprick and light touch intact throughout, bilaterally Deep Tendon Reflexes: 2+ and symmetric throughout Plantars: Right: equivocal   Left: downgoing Cerebellar: No gross ataxia disproportionate to her weakness Gait: Unable to assess   Lab Results: Basic Metabolic Panel: Recent Labs  Lab 03/28/22 1300  NA 139  139  K 4.4  4.4  CL 105  104  CO2 23  GLUCOSE 144*  144*  BUN 12  13  CREATININE 0.86  0.70  CALCIUM 9.5     CBC: Recent Labs  Lab 03/28/22 1300  WBC 7.3  NEUTROABS 5.4  HGB 15.1*  15.6*  HCT 47.0*  46.0  MCV 86.6  PLT 298     Cardiac Enzymes: No results for input(s): "CKTOTAL", "CKMB", "CKMBINDEX", "TROPONINI" in the last 168 hours.  Lipid Panel: No results for input(s): "CHOL", "TRIG", "HDL", "CHOLHDL", "VLDL", "LDLCALC" in the last 168 hours.  Imaging: CT ANGIO HEAD NECK W WO CM (CODE STROKE)  Result Date: 03/28/2022 CLINICAL DATA:  Right-sided deficits, aphasia, concern for left-sided stroke EXAM: CT ANGIOGRAPHY HEAD AND NECK TECHNIQUE: Multidetector CT imaging of the head and neck was performed using the standard protocol during bolus administration of intravenous contrast. Multiplanar CT image reconstructions and MIPs were obtained to evaluate the vascular anatomy. Carotid stenosis measurements (when applicable) are obtained utilizing NASCET criteria, using the distal internal carotid diameter as the denominator. RADIATION DOSE REDUCTION: This exam was performed according to the departmental dose-optimization program which includes automated exposure control, adjustment of the mA and/or kV according to  patient size and/or use of iterative reconstruction technique. CONTRAST:  46m OMNIPAQUE IOHEXOL 350 MG/ML SOLN  COMPARISON:  No prior CTA, correlation is made with CT head 03/28/2022 FINDINGS: CT HEAD FINDINGS For noncontrast findings, please see same day CT head. CTA NECK FINDINGS Aortic arch: Two-vessel arch with a common origin of the brachiocephalic and left common carotid arteries. Imaged portion shows no evidence of aneurysm or dissection. No significant stenosis of the major arch vessel origins. Aortic atherosclerosis. Right carotid system: No evidence of dissection, occlusion, or hemodynamically significant stenosis (greater than 50%). Atherosclerotic disease at the bifurcation and in the proximal ICA is not hemodynamically significant. Left carotid system: No evidence of dissection, occlusion, or hemodynamically significant stenosis (greater than 50%). Atherosclerotic disease at the bifurcation and in the proximal ICA is not hemodynamically significant. Vertebral arteries: Mild narrowing in the proximal right V1 segment. The vertebral arteries are otherwise patent to the skull base. Skeleton: No acute osseous abnormality. Other neck: No acute finding. Upper chest: No focal pulmonary opacity or pleural effusion. Review of the MIP images confirms the above findings CTA HEAD FINDINGS Anterior circulation: Both internal carotid arteries are patent to the termini, without significant stenosis. A1 segments patent. Normal anterior communicating artery. Anterior cerebral arteries are patent to their distal aspects. Occlusion of the distal left M1 (series 7, image 92) with minimal opacification in the proximal left M2 branches (series 7, image 90 and 93) but little to no opacification past the origins or in the remainder of the left MCA branches. Mild stenosis in the distal right M1 with mild-to-moderate stenosis in the proximal more superior right M2 branch. Right MCA branches otherwise perfused. Posterior circulation: Vertebral arteries patent to the vertebrobasilar junction with mild stenosis in the mid left V4 (series 7,  image 152 posterior inferior cerebellar arteries patent proximally. Basilar patent to its distal aspect. Superior cerebellar arteries patent proximally. Patent P1 segments, with moderate stenosis in the proximal right P1 (series 7, image 100-101). Long segment moderate stenosis in the right P2 (series 7, images 101-106) and poor perfusion of the right P3 segments (series 7, images 102 and 104). Mild multifocal stenosis in the left P2 (series 7, image 105). Severe stenosis at the origin of the more lateral left P3 (series 7, images 101-104). The bilateral posterior communicating arteries are not visualized. Venous sinuses: As permitted by contrast timing, patent. Anatomic variants: None significant. Review of the MIP images confirms the above findings IMPRESSION: 1. Occlusion of the left distal M1 with minimal flow in the proximal left M2 branches and poor perfusion of the remainder of the left MCA territory. This correlates with the hyperdensity seen on the same-day CT head. 2. Multifocal stenosis in the bilateral PCAs, which is severe at the origin of one of the left P3 branches, moderate in the proximal right P1 and throughout the P2, and mild in the left P2. Poor perfusion of the right P3 segments. 3. Additional mild stenosis in the distal right M1, proximal M2, and in the left V4. 4. Mild stenosis of the proximal right V1 segment. No other hemodynamically significant stenosis in the neck. Code stroke imaging results were communicated on 03/28/2022 at 1:44 pm to provider Dr. LCheral Markervia telephone, who verbally acknowledged these results. Electronically Signed   By: AMerilyn BabaM.D.   On: 03/28/2022 14:04   CT HEAD CODE STROKE WO CONTRAST  Result Date: 03/28/2022 CLINICAL DATA:  Code stroke. EXAM: CT HEAD WITHOUT CONTRAST TECHNIQUE: Contiguous axial images were obtained from  the base of the skull through the vertex without intravenous contrast. RADIATION DOSE REDUCTION: This exam was performed according to  the departmental dose-optimization program which includes automated exposure control, adjustment of the mA and/or kV according to patient size and/or use of iterative reconstruction technique. COMPARISON:  None Available. FINDINGS: Brain: No evidence of acute infarction, hemorrhage, hydrocephalus, extra-axial collection or mass lesion/mass effect. Vascular: Hyperdensity in the left MCA, near the bifurcation and in the proximal M2 branches (series 5, image 25-27), which may represent atherosclerotic disease and/or thrombus. Skull: Normal. Negative for fracture or focal lesion. Sinuses/Orbits: No acute finding. Other: The mastoids are well aerated. ASPECTS [facility] Stroke Program Early CT Score) - Ganglionic level infarction (caudate, lentiform nuclei, internal capsule, insula, M1-M3 cortex): 7 - Supraganglionic infarction (M4-M6 cortex): 3 Total score (0-10 with 10 being normal): 10 IMPRESSION: 1. Hyperdensity in the left MCA near the bifurcation and in the proximal M2 branches, which may represent atherosclerotic disease and/or thrombus. 2. ASPECTS is 10 Code stroke imaging results were communicated on 03/28/2022 at 1:14 pm to provider Dr. Cheral Marker via telephone, who verbally acknowledged these results. Electronically Signed   By: Merilyn Baba M.D.   On: 03/28/2022 13:17     Assessment: 76 y.o. female with a PMHx of gallstones, gestational diabetes, HLD, HTN LBBB s/p pacemaker placement and hypotension, presenting to the ED via EMS with acute onset of right hemiplegia and aphasia. LKN was 1000. She was found slumped over to the right on her couch at home by her husband. Husband states that during a work up for another condition brain imaging was performed, revealing an old stroke, but patient was never aware of any stroke-like symptoms in the past. She is not on a blood thinner. On arrival to the ED, she continued to be aphasic, with dense right sided weakness.  - Exam reveals deficits referable to her left  cerebral hemisphere, most consistent with a large stroke involving the MCA territory.  - CT head: Hyperdensity in the left MCA near the bifurcation and in the proximal M2 branches, which may represent atherosclerotic disease and/or thrombus. ASPECTS is 10 - CTA of head and neck: Occlusion of the left distal M1 with minimal flow in the proximal left M2 branches and poor perfusion of the remainder of the left MCA territory. This correlates with the hyperdensity seen on the same-day CT head. Multifocal stenosis in the bilateral PCAs, which is severe at the origin of one of the left P3 branches, moderate in the proximal right P1 and throughout the P2, and mild in the left P2. Poor perfusion of the right P3 segments. Additional mild stenosis in the distal right M1, proximal M2, and in the left V4. Mild stenosis of the proximal right V1 segment.  - After comprehensive review of possible contraindications, she has no absolute contraindications to TNK administration. Patient is a TNK candidate. Discussed extensively the risks/benefits of TNK treatment vs. no treatment with the patient's husband, including risks of hemorrhage and death with TNK administration versus worse overall outcomes on average in patients within the IV thrombolytic time window who are not administered TNK. The patient's aphasia precludes meaningful medical decision making on her part at this time. Overall benefits of TNK regarding long-term prognosis are felt to outweigh risks. The patient's husband expressed understanding and wish to proceed with TNK. - The patient is a VIR candidate. Risks/benefits of the procedure were discussed extensively with patient's husband, including approximately 50% chance of significant improvement relative to an approximate  10% chance of subarachnoid hemorrhage with possibility of significant worsening including death. The patient's husband expressed understanding and provided informed consent to proceed with VIR.  All questions answered. Consent form signed.  Recommendations: - Admitting to Neuro ICU.  - Post-TNK order set to include frequent neuro checks and BP management.  - No antiplatelet medications or anticoagulants for at least 24 hours following TNK.  - DVT prophylaxis with SCDs.  - Will need to be started on a statin. Obtain baseline CK level - Will to be started on antiplatelet therapy if follow up CT at 24 hours is negative for hemorrhagic conversion. - TTE.  - MRI brain if possible given her pacemaker. Will need MRI techs to determine if her pacemaker ([place], Model # [id number]) is MRI compatible.  - PT/OT/Speech.  - NPO until passes swallow evaluation.  - Telemetry monitoring - Fasting lipid panel, HgbA1c  85 minutes spent in the emergent neurological evaluation and management of this critically ill patient.   Addendum: - CT head after VIR shows hyperdense signal most consistent with hemorrhage combined with contrast in the subarachnoid spaces along the left frontotemporal cortices, in addition to contrast staining within the basal ganglia. The amount of hemorrhage does not appear to strongly militate in favor of TNK reversal, but will need repeat CT head in 3 hours (ordered for 7:20 PM) - Continue close neuro monitoring.  - Discussed with Dr. Debbrah Alar  Addendum: - Repeat CT head at 1943:  1. Hyperdensity in the left basal ganglia is less conspicuous on this exam, favoring contrast staining rather than hemorrhage. 2. Subarachnoid hyperdensity along the left cerebral hemisphere is also slightly decreased in conspicuity and may represent a combination of subarachnoid hemorrhage and extravasated contrast. 3. Sulcal effacement and local mass effect in the left cerebral hemisphere, most likely edema, without midline shift. - Continue with current plan of care  Electronically signed: Dr. Kerney Elbe 03/28/2022, 2:45 PM

## 2022-03-28 NOTE — Progress Notes (Addendum)
PHARMACIST CODE STROKE RESPONSE  Notified to mix TNK at 13:25 by Dr. Cheral Marker TNK preparation completed at 13:28  TNK dose = 22 mg IV over 5 seconds.   Issues/delays encountered (if applicable): Prolonged decision making time due to language barriers and discussion/consent from family. TNK given by RN at 13:29 once CTA/P completed.   Kaleen Mask 03/28/22 1:30 PM

## 2022-03-28 NOTE — Anesthesia Preprocedure Evaluation (Signed)
Anesthesia Evaluation  Patient identified by MRN, date of birth, ID band Patient confused    Reviewed: Allergy & Precautions, NPO status , Patient's Chart, lab work & pertinent test resultsPreop documentation limited or incomplete due to emergent nature of procedure.  Airway Mallampati: II  TM Distance: >3 FB Neck ROM: Full    Dental no notable dental hx.    Pulmonary neg pulmonary ROS,    Pulmonary exam normal breath sounds clear to auscultation       Cardiovascular hypertension, Pt. on home beta blockers Normal cardiovascular exam+ dysrhythmias (LBBB) + pacemaker  Rhythm:Regular Rate:Normal     Neuro/Psych CVA, Residual Symptoms    GI/Hepatic negative GI ROS, Neg liver ROS,   Endo/Other  diabetesObesity   Renal/GU negative Renal ROS     Musculoskeletal negative musculoskeletal ROS (+)   Abdominal   Peds  Hematology negative hematology ROS (+)   Anesthesia Other Findings Day of surgery medications reviewed with the patient.  Reproductive/Obstetrics                            Anesthesia Physical Anesthesia Plan  ASA: 4  Anesthesia Plan: General   Post-op Pain Management:    Induction: Intravenous  PONV Risk Score and Plan: 3 and Ondansetron, Dexamethasone and Treatment may vary due to age or medical condition  Airway Management Planned: Oral ETT  Additional Equipment:   Intra-op Plan:   Post-operative Plan: Post-operative intubation/ventilation  Informed Consent: I have reviewed the patients History and Physical, chart, labs and discussed the procedure including the risks, benefits and alternatives for the proposed anesthesia with the patient or authorized representative who has indicated his/her understanding and acceptance.     Dental advisory given  Plan Discussed with: CRNA and Surgeon  Anesthesia Plan Comments:         Anesthesia Quick Evaluation

## 2022-03-28 NOTE — Progress Notes (Signed)
Post IR intervention. Pt intubated and sedated, under the care of anesthesia.

## 2022-03-28 NOTE — Procedures (Signed)
INTERVENTIONAL NEURORADIOLOGY BRIEF POSTPROCEDURE NOTE  DIAGNOSTIC CEREBRAL ANGIOGRAM AND MECHANICAL THROMBECTOMY   Attending: Dr. Pedro Earls  Diagnosis: Left M1/MCA occlusion   Access site: Right common femoral artery   Access closure: Perclose pro style   Anesthesia: General anesthesia   Medication used: Refer to anesthesia documentation.  Complications: Small left sylvian subarachnoid hemorrhage   Estimated blood loss: Approximately 100 mL   Specimen: None.   Findings: Proximal left M1/MCA occlusion.  Mechanical thrombectomy performed with combined stent retriever and aspiration with complete recanalization after 4 passes (TICI 3).  Residual moderate stenosis at the proximal left M2/MCA posterior division branch.  Flat panel CT of nondiagnostic quality.  Therefore, patient was transferred to CT scan.  Head CT showed a small to moderate subarachnoid hemorrhage/contrast extravasation.  Hyperdensity within the basal ganglia suggestive of ongoing ischemia with contrast staining.    Plan: Bed rest x6 hours post femoral puncture.  Repeat head CT within 3 hours to evaluate for subarachnoid hemorrhage stability  SBP 120-140 mm Hg  Vent management per CCM

## 2022-03-28 NOTE — Progress Notes (Addendum)
Patient's admission temp was 93.0 axillary. Esophageal temp probe placed and bair hugger. Georgann Housekeeper NP (CCM) notified.   Montez Hageman RN

## 2022-03-28 NOTE — ED Notes (Signed)
Patient POC to go to IR, stroke nurses are moving patient at this time. Family made aware via neurology team. Patient BP currently controlled with cleviprex. VSS.

## 2022-03-28 NOTE — Transfer of Care (Signed)
Immediate Anesthesia Transfer of Care Note  Patient: Elaine Ford  Procedure(s) Performed: IR WITH ANESTHESIA  Patient Location: ICU  Anesthesia Type:General  Level of Consciousness: Patient remains intubated per anesthesia plan  Airway & Oxygen Therapy: Patient remains intubated per anesthesia plan and Patient placed on Ventilator (see vital sign flow sheet for setting)  Post-op Assessment: Report given to RN and Post -op Vital signs reviewed and stable  Post vital signs: Reviewed and stable  Last Vitals:  Vitals Value Taken Time  BP 144/66 03/28/22 1630  Temp    Pulse 68 03/28/22 1630  Resp 18 03/28/22 1630  SpO2 100 % 03/28/22 1630  Vitals shown include unvalidated device data.  Last Pain:  Vitals:   03/28/22 1303  TempSrc: Oral         Complications: No notable events documented.

## 2022-03-28 NOTE — Telephone Encounter (Signed)
Will send this information to Dr. Lovena Le and RN, as a general FYI.

## 2022-03-28 NOTE — Progress Notes (Signed)
Pt transported from 4N23 to CT and back with no complications.

## 2022-03-28 NOTE — Progress Notes (Signed)
Spoke to patient's husband at bedside and updated him on patient's status. Patient's husband requests a call at this number for any changes   Margaret Cockerill  [phone]  Montez Hageman RN

## 2022-03-28 NOTE — Code Documentation (Signed)
Stroke Response Nurse Documentation Code Documentation  Elaine Ford is a 76 y.o. female arriving to [facility]  via [facility] on 03/28/2022 with past medical hx of hypertension, hyperlipidemia, left bundle branch block, nerve damage right arm, pacemaker. On No antithrombotic. Code stroke was activated by EMS.   Patient from home where she was LKW at 1000 and later family found her slumped over on couch. She would not follow commands or speak. She is spanish speaking only.   Stroke team at the bedside on patient arrival. Labs drawn and patient cleared for CT by Dr. Alvino Chapel. Interpretor utilized for assessment. Patient to CT with team. NIHSS 23, see documentation for details and code stroke times. Patient with disoriented, not following commands, left gaze preference , right hemianopia, right facial droop, right arm weakness, bilateral leg weakness, right decreased sensation, Global aphasia , dysarthria , and Visual  neglect on exam. The following imaging was completed:  CT Head and CTA. Delay in treating initial elevated BP due to no IV access. Cleviprex started as BP remained elevated. Patient is a candidate for IV Thrombolytic and administered 1329. BP 174/66 prior to TNK. Patient is a candidate for IR due to LVO positive. CT scan showed "occlusion of the distal left M1." Providers spoke with patient's husband and obtained consent for IR. Code swab completed and patient prepped.   Care Plan: admit to ICU; follow post TNK/IR orders.   Bedside handoff with IR.  Leverne Humbles Stroke Response RN

## 2022-03-29 ENCOUNTER — Inpatient Hospital Stay (HOSPITAL_COMMUNITY): Payer: Medicare HMO

## 2022-03-29 ENCOUNTER — Encounter (HOSPITAL_COMMUNITY): Payer: Self-pay | Admitting: Radiology

## 2022-03-29 DIAGNOSIS — J9589 Other postprocedural complications and disorders of respiratory system, not elsewhere classified: Secondary | ICD-10-CM | POA: Diagnosis not present

## 2022-03-29 DIAGNOSIS — I484 Atypical atrial flutter: Secondary | ICD-10-CM | POA: Diagnosis not present

## 2022-03-29 DIAGNOSIS — I6389 Other cerebral infarction: Secondary | ICD-10-CM

## 2022-03-29 DIAGNOSIS — Z950 Presence of cardiac pacemaker: Secondary | ICD-10-CM

## 2022-03-29 DIAGNOSIS — I639 Cerebral infarction, unspecified: Secondary | ICD-10-CM | POA: Diagnosis not present

## 2022-03-29 DIAGNOSIS — J9621 Acute and chronic respiratory failure with hypoxia: Secondary | ICD-10-CM

## 2022-03-29 DIAGNOSIS — I63412 Cerebral infarction due to embolism of left middle cerebral artery: Secondary | ICD-10-CM | POA: Diagnosis not present

## 2022-03-29 DIAGNOSIS — J9622 Acute and chronic respiratory failure with hypercapnia: Secondary | ICD-10-CM

## 2022-03-29 DIAGNOSIS — I609 Nontraumatic subarachnoid hemorrhage, unspecified: Secondary | ICD-10-CM | POA: Diagnosis not present

## 2022-03-29 LAB — ECHOCARDIOGRAM COMPLETE
Area-P 1/2: 2.71 cm2
Height: 64 in
P 1/2 time: 457 msec
Weight: 3132.3 oz

## 2022-03-29 LAB — LIPID PANEL
Cholesterol: 213 mg/dL — ABNORMAL HIGH (ref 0–200)
HDL: 36 mg/dL — ABNORMAL LOW (ref >40)
LDL Cholesterol: 104 mg/dL — ABNORMAL HIGH (ref 0–99)
Total CHOL/HDL Ratio: 5.9 RATIO
Triglycerides: 364 mg/dL — ABNORMAL HIGH (ref <150)
VLDL: 73 mg/dL — ABNORMAL HIGH (ref 0–40)

## 2022-03-29 MED ORDER — CHLORHEXIDINE GLUCONATE CLOTH 2 % EX PADS
6.0000 | MEDICATED_PAD | Freq: Every day | CUTANEOUS | Status: DC
  Administered 2022-03-29 – 2022-04-06 (×8): 6 via TOPICAL

## 2022-03-29 MED ORDER — ATORVASTATIN CALCIUM 40 MG PO TABS
40.0000 mg | ORAL_TABLET | Freq: Every day | ORAL | Status: DC
  Administered 2022-03-29: 40 mg
  Filled 2022-03-29: qty 1

## 2022-03-29 MED ORDER — ASPIRIN 300 MG RE SUPP: 300.0000 mg | Freq: Once | RECTAL | Status: AC

## 2022-03-29 MED ORDER — ASPIRIN 81 MG PO CHEW
81.0000 mg | CHEWABLE_TABLET | Freq: Once | ORAL | Status: AC
  Administered 2022-03-29: 81 mg
  Filled 2022-03-29: qty 1

## 2022-03-29 MED ORDER — LABETALOL HCL 5 MG/ML IV SOLN: 20.0000 mg | INTRAVENOUS | Status: DC | PRN

## 2022-03-29 MED ORDER — ACETAMINOPHEN 650 MG RE SUPP
650.0000 mg | Freq: Four times a day (QID) | RECTAL | Status: DC | PRN
  Administered 2022-03-29 – 2022-04-01 (×3): 650 mg via RECTAL
  Filled 2022-03-29 (×3): qty 1

## 2022-03-29 MED ORDER — ASPIRIN 81 MG PO TBEC: 81.0000 mg | DELAYED_RELEASE_TABLET | Freq: Once | ORAL | Status: AC

## 2022-03-29 NOTE — Progress Notes (Signed)
PT Cancellation Note  Patient Details Name: Elaine Ford MRN: [id number] DOB: Aug 20, 1945   Cancelled Treatment:    Reason Eval/Treat Not Completed: Active bedrest order;Patient not medically ready. TNK administered 9/18 at 1329. Pt also underwent thrombectomy and remains intubated/sedated.   Lorriane Shire 03/29/2022, 7:17 AM

## 2022-03-29 NOTE — Consult Note (Signed)
NAME:  Elaine Ford, MRN:  [id number], DOB:  20-Oct-1945, LOS: 1 ADMISSION DATE:  03/28/2022, CONSULTATION DATE:  9/18 REFERRING MD:  Dr. Cheral Marker, CHIEF COMPLAINT:  CVA   History of Present Illness:  Patient is encephalopathic and/or intubated. Therefore history has been obtained from chart review.  76 year old female with past medical history as below, which is significant for diabetes, hypertension, hyperlipidemia, followed by block status post pacemaker, and hypotension.  She was in her usual state of health until 10 AM on 9/18.  She presented most emergency department that same day at approximately 1:00 PM with complaints of acute onset right hemiplegia and aphasia.  Her husband found her slumped over to the right on the couch at home.  Upon arrival to the ED she was confirmed to be aphasic with severe right-sided weakness.  Exam was concerning for left MCA territory infarct.  CT of the head demonstrated hypodensity in the left MCA without hemorrhage.  The patient was administered systemic TNK.  CT angiogram and perfusion deemed the patient a favorable candidate for mechanical thrombectomy.  She was taken to IR where she underwent mechanical thrombectomy of proximal left M1/MCA occlusion with a TICI 3 result.  Postoperative CT did demonstrate small subarachnoid hemorrhage.  She was transferred to the ICU on mechanical ventilator for recovery.   Pertinent  Medical History   has a past medical history of Back pain, Broken arm, Gall bladder stones, Gestational diabetes, Hemorrhoid, cardiac cath, cardiovascular stress test, echocardiogram, Hyperlipidemia, Hypertension, LBBB (left bundle branch block), Low blood pressure, Nerve damage, Pain, S/P cardiac pacemaker procedure (07/2014), and Stroke [facility]).   Significant Hospital Events: Including procedures, antibiotic start and stop dates in addition to other pertinent events   9/18 admit for CVA.  Systemic TNK mechanical thrombectomy.  Post procedurally on  vent to ICU.  Interim History / Subjective:  Placed on pressure support trial, not tolerating due to tachypnea Remained afebrile Repeat head CT confirmed combination of contrast extravasation and subarachnoid hemorrhage  Objective   Blood pressure (!) 123/47, pulse 60, temperature 99.7 F (37.6 C), temperature source Esophageal, resp. rate 18, height '5\' 4"'$  (1.626 m), weight 88.8 kg, SpO2 100 %.    Vent Mode: CPAP;PSV FiO2 (%):  [40 %-60 %] 40 % Set Rate:  [16 bmp] 16 bmp Vt Set:  [430 mL] 430 mL PEEP:  [5 cmH20] 5 cmH20 Pressure Support:  [5 cmH20] 5 cmH20 Plateau Pressure:  [18 cmH20-19 cmH20] 18 cmH20   Intake/Output Summary (Last 24 hours) at 03/29/2022 1231 Last data filed at 03/29/2022 1100 Gross per 24 hour  Intake 3451.13 ml  Output 2430 ml  Net 1021.13 ml   Filed Weights   03/28/22 1200  Weight: 88.8 kg    Examination: Physical exam: General: Crtitically ill-appearing obese female, orally intubated HEENT: [place]/AT, eyes anicteric.  ETT and OGT in place Neuro: Awake, intermittently following commands, forced left gaze deviation, left-sided weaker than right Chest: Coarse breath sounds, no wheezes or rhonchi Heart: Regular rate and rhythm, no murmurs or gallops Abdomen: Soft, nontender, nondistended, bowel sounds present Skin: No rash  Resolved Hospital Problem list     Assessment & Plan:  Acute left MCA stroke s/p TNK and thrombectomy  Postprocedure subarachnoid hemorrhage with contrast extravasation  Continue neuro watch every hour Hold antiplatelet and anticoagulation for now Patient has paced maker which is not compatible with MRI We will get repeat head CT at 3 PM Continue Cleviprex with SBP goal 120-140 Continue secondary stroke prophylaxis  Echocardiogram is pending   Acute encephalopathy in the setting of acute stroke and subarachnoid hemorrhage Minimize sedation with RASS goal 0/-1  Acute respiratory insufficiency, postprocedure Continue lung  protective ventilation After head CT, will try again for respiratory breathing trial VAP bundle in place  Complete heart block: s/p pacemaker: paced on monitor. EKG unchanged.  Telemetry monitoring  Hypertension, hold carvedilol  Obesity Dietitian follow-up  Best Practice (right click and "Reselect all SmartList Selections" daily)   Diet/type: NPO DVT prophylaxis: SCD GI prophylaxis: PPI Lines: N/A Foley:  N/A Code Status:  full code Last date of multidisciplinary goals of care discussion [9/19: With patient's husband, decision was to continue full scope of care]  Labs   CBC: Recent Labs  Lab 03/28/22 1300 03/28/22 1719  WBC 7.3  --   NEUTROABS 5.4  --   HGB 15.1*  15.6* 15.3*  HCT 47.0*  46.0 45.0  MCV 86.6  --   PLT 298  --     Basic Metabolic Panel: Recent Labs  Lab 03/28/22 1300 03/28/22 1719  NA 139  139 137  K 4.4  4.4 3.8  CL 105  104  --   CO2 23  --   GLUCOSE 144*  144*  --   BUN 12  13  --   CREATININE 0.86  0.70  --   CALCIUM 9.5  --    GFR: Estimated Creatinine Clearance: 60 mL/min (by C-G formula based on SCr of 0.86 mg/dL). Recent Labs  Lab 03/28/22 1300  WBC 7.3    Liver Function Tests: Recent Labs  Lab 03/28/22 1300  AST 28  ALT 21  ALKPHOS 74  BILITOT 0.8  PROT 7.5  ALBUMIN 3.7   No results for input(s): "LIPASE", "AMYLASE" in the last 168 hours. No results for input(s): "AMMONIA" in the last 168 hours.  ABG    Component Value Date/Time   PHART 7.453 (H) 03/28/2022 1719   PCO2ART 34.4 03/28/2022 1719   PO2ART 134 (H) 03/28/2022 1719   HCO3 24.8 03/28/2022 1719   TCO2 26 03/28/2022 1719   O2SAT 99 03/28/2022 1719     Coagulation Profile: Recent Labs  Lab 03/28/22 1300  INR 1.0    Cardiac Enzymes: No results for input(s): "CKTOTAL", "CKMB", "CKMBINDEX", "TROPONINI" in the last 168 hours.  HbA1C: Hemoglobin A1C  Date/Time Value Ref Range Status  07/22/2014 05:34 PM 5.6 4.2 - 6.3 % Final    Comment:     The American Diabetes Association recommends that a primary goal of therapy should be <7% and that physicians should reevaluate the treatment regimen in patients with HbA1c values consistently >8%.    Hgb A1c MFr Bld  Date/Time Value Ref Range Status  03/28/2022 01:00 PM 5.5 4.8 - 5.6 % Final    Comment:    (NOTE) Pre diabetes:          5.7%-6.4%  Diabetes:              >6.4%  Glycemic control for   <7.0% adults with diabetes     CBG: Recent Labs  Lab 03/28/22 1254  GLUCAP 149*     Total critical care time: 36 minutes  Performed by: [place] care time was exclusive of separately billable procedures and treating other patients.   Critical care was necessary to treat or prevent imminent or life-threatening deterioration.   Critical care was time spent personally by me on the following activities: development of treatment plan with patient  and/or surrogate as well as nursing, discussions with consultants, evaluation of patient's response to treatment, examination of patient, obtaining history from patient or surrogate, ordering and performing treatments and interventions, ordering and review of laboratory studies, ordering and review of radiographic studies, pulse oximetry and re-evaluation of patient's condition.   Jacky Kindle, MD [facility] See Amion for pager If no response to pager, please call [phone] until 7pm After 7pm, Please call E-link [phone]

## 2022-03-29 NOTE — Progress Notes (Addendum)
STROKE TEAM PROGRESS NOTE   INTERVAL HISTORY Elaine Ford is at the bedside.   Remains intubated post procedure Pacemaker interrogated showing brief episodes of atrial tachycardia.  Per cardiology this alone does not qualify Elaine for anticoagulation  Vitals:   03/29/22 0700 03/29/22 0715 03/29/22 0730 03/29/22 0745  BP: (!) 135/52 (!) 137/53 137/69 (!) 128/52  Pulse: 63 63 67 (!) 59  Resp: '17 13 19 15  '$ Temp:      TempSrc:      SpO2: 99% 99% 98% 98%  Weight:      Height:       CBC:  Recent Labs  Lab 03/28/22 1300 03/28/22 1719  WBC 7.3  --   NEUTROABS 5.4  --   HGB 15.1*  15.6* 15.3*  HCT 47.0*  46.0 45.0  MCV 86.6  --   PLT 298  --    Basic Metabolic Panel:  Recent Labs  Lab 03/28/22 1300 03/28/22 1719  NA 139  139 137  K 4.4  4.4 3.8  CL 105  104  --   CO2 23  --   GLUCOSE 144*  144*  --   BUN 12  13  --   CREATININE 0.86  0.70  --   CALCIUM 9.5  --    Lipid Panel: No results for input(s): "CHOL", "TRIG", "HDL", "CHOLHDL", "VLDL", "LDLCALC" in the last 168 hours. HgbA1c:  Recent Labs  Lab 03/28/22 1300  HGBA1C 5.5   Urine Drug Screen: No results for input(s): "LABOPIA", "COCAINSCRNUR", "LABBENZ", "AMPHETMU", "THCU", "LABBARB" in the last 168 hours.  Alcohol Level  Recent Labs  Lab 03/28/22 1300  ETH <10    IMAGING past 24 hours DG Abd 1 View  Result Date: 03/28/2022 CLINICAL DATA:  Check gastric catheter placement EXAM: ABDOMEN - 1 VIEW COMPARISON:  None Available. FINDINGS: Gastric catheter is noted coiled within the stomach. Contrast is noted within the kidneys from prior arteriogram. No free air is seen. IMPRESSION: Gastric catheter within the stomach. Electronically Signed   By: Inez Catalina M.D.   On: 03/28/2022 21:57   CT HEAD WO CONTRAST (5MM)  Result Date: 03/28/2022 CLINICAL DATA:  Assess stability of hemorrhage EXAM: CT HEAD WITHOUT CONTRAST TECHNIQUE: Contiguous axial images were obtained from the base of the skull through the vertex  without intravenous contrast. RADIATION DOSE REDUCTION: This exam was performed according to the departmental dose-optimization program which includes automated exposure control, adjustment of the mA and/or kV according to patient size and/or use of iterative reconstruction technique. COMPARISON:  03/28/2022 4:20 p.m. FINDINGS: Brain: Redemonstrated hyperdensity involving the left basal ganglia, which is slightly decreased in conspicuity and favored to represent contrast staining rather than hemorrhage. Additional subarachnoid hyperdensity along the left cerebral hemisphere, particularly in the left sylvian fissure, is also decreased in conspicuity and may represent a combination of extravasated contrast and subarachnoid hemorrhage. No definite acute infarct, mass, or midline shift. Sulcal effacement in the left cerebral hemisphere, particularly the sylvian fissure, with increased prominence of the caudate compared to the earliest same day CT, likely reflecting edema and mild mass effect. Mild narrowing of the left lateral ventricle frontal horn. No hydrocephalus. Vascular: Contrast remains in the vascular system. No definite hyperdense vessel. Skull: Normal. Negative for fracture or focal lesion. Sinuses/Orbits: No acute finding. Other: The mastoids are well aerated. IMPRESSION: 1. Hyperdensity in the left basal ganglia is less conspicuous on this exam, favoring contrast staining rather than hemorrhage. 2. Subarachnoid hyperdensity along the left cerebral hemisphere  is also slightly decreased in conspicuity and may represent a combination of subarachnoid hemorrhage and extravasated contrast. 3. Sulcal effacement and local mass effect in the left cerebral hemisphere, most likely edema, without midline shift. Electronically Signed   By: Merilyn Baba M.D.   On: 03/28/2022 20:02   DG CHEST PORT 1 VIEW  Result Date: 03/28/2022 CLINICAL DATA:  Intubation. EXAM: PORTABLE CHEST 1 VIEW COMPARISON:  07/25/2014 FINDINGS:  Endotracheal tube tip is 12 mm from the carina, mildly low in positioning. Enteric tube is in place with tip below the diaphragm, not included in the field of view. Probable temperature probe in place. Left-sided pacemaker in place. Low lung volumes. Chronic cardiomegaly, unchanged. No pulmonary edema, focal airspace disease, large pleural effusion or pneumothorax. IMPRESSION: 1. Endotracheal tube tip 12 mm from the carina, mildly low in positioning. Recommend retraction of 1-2 cm for optimal placement. Enteric tube in place with tip below the diaphragm, not included in the field of view. 2. Low lung volumes with chronic cardiomegaly. Electronically Signed   By: Keith Rake M.D.   On: 03/28/2022 18:06   CT HEAD WO CONTRAST (5MM)  Addendum Date: 03/28/2022   ADDENDUM REPORT: 03/28/2022 17:13 ADDENDUM: These results were called by telephone at the time of interpretation on 03/28/2022 at 5:00 pm to provider ERIC Asante [facility] , who verbally acknowledged these results. Electronically Signed   By: Kellie Simmering D.O.   On: 03/28/2022 17:13   Result Date: 03/28/2022 CLINICAL DATA:  Provided history: Neuro deficit, acute, stroke suspected. EXAM: CT HEAD WITHOUT CONTRAST TECHNIQUE: Contiguous axial images were obtained from the base of the skull through the vertex without intravenous contrast. RADIATION DOSE REDUCTION: This exam was performed according to the departmental dose-optimization program which includes automated exposure control, adjustment of the mA and/or kV according to patient size and/or use of iterative reconstruction technique. COMPARISON:  Non-contrast head CT 03/28/2022. CT angiogram head/neck 03/28/2022. FINDINGS: Brain: Mild generalized parenchymal atrophy. Hyperdensity within the left caudate and lentiform nuclei, which may reflect contrast staining and/or hemorrhage. No significant associated mass effect at this time. Additionally, there is moderate-volume hyperdensity along the left cerebral  hemisphere, most notably within the sylvian fissure. This likely reflects a combination of subarachnoid hemorrhage and extravasated contrast. Findings have progressed from the postprocedural head CT performed earlier today at 3:29 p.m. Mild patchy and ill-defined hypoattenuation within the cerebral white matter, nonspecific but compatible with chronic small vessel ischemic disease. Redemonstrated small chronic appearing infarct within the left cerebellar hemisphere. No midline shift or hydrocephalus. Vascular: Residual circulating contrast limits evaluation for hyperdense vessels. The thrombus/embolus which was previously present within the M1 left middle cerebral artery now appears to have migrated more distally into an M2 left MCA vessel (series 3, image 15). Skull: Normal. Negative for fracture or focal lesion. Sinuses/Orbits: No mass or acute finding within the imaged orbits. Mild mucosal thickening within the bilateral ethmoid air cells. Attempts are being made to reach the ordering provider at this time. IMPRESSION: Hyperdensity within the left caudate and lentiform nuclei, which may reflect contrast staining and/or hemorrhage. Moderate-volume hyperdensity along the left cerebral hemisphere, likely reflecting a combination of extravasated contrast and subarachnoid hemorrhage. Findings have progressed from the post-procedure head CT performed earlier today at 3:29 p.m. There is no significant associated mass effect at this time. No evidence of hydrocephalus. The thrombus/embolus which was previously present within the M1 left middle cerebral artery now appears to have migrated more distally into an M2 left MCA vessel. Electronically  Signed: By: Kellie Simmering D.O. On: 03/28/2022 16:45   CT ANGIO HEAD NECK W WO CM (CODE STROKE)  Result Date: 03/28/2022 CLINICAL DATA:  Right-sided deficits, aphasia, concern for left-sided stroke EXAM: CT ANGIOGRAPHY HEAD AND NECK TECHNIQUE: Multidetector CT imaging of the head  and neck was performed using the standard protocol during bolus administration of intravenous contrast. Multiplanar CT image reconstructions and MIPs were obtained to evaluate the vascular anatomy. Carotid stenosis measurements (when applicable) are obtained utilizing NASCET criteria, using the distal internal carotid diameter as the denominator. RADIATION DOSE REDUCTION: This exam was performed according to the departmental dose-optimization program which includes automated exposure control, adjustment of the mA and/or kV according to patient size and/or use of iterative reconstruction technique. CONTRAST:  70m OMNIPAQUE IOHEXOL 350 MG/ML SOLN COMPARISON:  No prior CTA, correlation is made with CT head 03/28/2022 FINDINGS: CT HEAD FINDINGS For noncontrast findings, please see same day CT head. CTA NECK FINDINGS Aortic arch: Two-vessel arch with a common origin of the brachiocephalic and left common carotid arteries. Imaged portion shows no evidence of aneurysm or dissection. No significant stenosis of the major arch vessel origins. Aortic atherosclerosis. Right carotid system: No evidence of dissection, occlusion, or hemodynamically significant stenosis (greater than 50%). Atherosclerotic disease at the bifurcation and in the proximal ICA is not hemodynamically significant. Left carotid system: No evidence of dissection, occlusion, or hemodynamically significant stenosis (greater than 50%). Atherosclerotic disease at the bifurcation and in the proximal ICA is not hemodynamically significant. Vertebral arteries: Mild narrowing in the proximal right V1 segment. The vertebral arteries are otherwise patent to the skull base. Skeleton: No acute osseous abnormality. Other neck: No acute finding. Upper chest: No focal pulmonary opacity or pleural effusion. Review of the MIP images confirms the above findings CTA HEAD FINDINGS Anterior circulation: Both internal carotid arteries are patent to the termini, without  significant stenosis. A1 segments patent. Normal anterior communicating artery. Anterior cerebral arteries are patent to their distal aspects. Occlusion of the distal left M1 (series 7, image 92) with minimal opacification in the proximal left M2 branches (series 7, image 90 and 93) but little to no opacification past the origins or in the remainder of the left MCA branches. Mild stenosis in the distal right M1 with mild-to-moderate stenosis in the proximal more superior right M2 branch. Right MCA branches otherwise perfused. Posterior circulation: Vertebral arteries patent to the vertebrobasilar junction with mild stenosis in the mid left V4 (series 7, image 152 posterior inferior cerebellar arteries patent proximally. Basilar patent to its distal aspect. Superior cerebellar arteries patent proximally. Patent P1 segments, with moderate stenosis in the proximal right P1 (series 7, image 100-101). Long segment moderate stenosis in the right P2 (series 7, images 101-106) and poor perfusion of the right P3 segments (series 7, images 102 and 104). Mild multifocal stenosis in the left P2 (series 7, image 105). Severe stenosis at the origin of the more lateral left P3 (series 7, images 101-104). The bilateral posterior communicating arteries are not visualized. Venous sinuses: As permitted by contrast timing, patent. Anatomic variants: None significant. Review of the MIP images confirms the above findings IMPRESSION: 1. Occlusion of the left distal M1 with minimal flow in the proximal left M2 branches and poor perfusion of the remainder of the left MCA territory. This correlates with the hyperdensity seen on the same-day CT head. 2. Multifocal stenosis in the bilateral PCAs, which is severe at the origin of one of the left P3 branches, moderate in  the proximal right P1 and throughout the P2, and mild in the left P2. Poor perfusion of the right P3 segments. 3. Additional mild stenosis in the distal right M1, proximal M2,  and in the left V4. 4. Mild stenosis of the proximal right V1 segment. No other hemodynamically significant stenosis in the neck. Code stroke imaging results were communicated on 03/28/2022 at 1:44 pm to provider Dr. Cheral Marker via telephone, who verbally acknowledged these results. Electronically Signed   By: Merilyn Baba M.D.   On: 03/28/2022 14:04   CT HEAD CODE STROKE WO CONTRAST  Result Date: 03/28/2022 CLINICAL DATA:  Code stroke. EXAM: CT HEAD WITHOUT CONTRAST TECHNIQUE: Contiguous axial images were obtained from the base of the skull through the vertex without intravenous contrast. RADIATION DOSE REDUCTION: This exam was performed according to the departmental dose-optimization program which includes automated exposure control, adjustment of the mA and/or kV according to patient size and/or use of iterative reconstruction technique. COMPARISON:  None Available. FINDINGS: Brain: No evidence of acute infarction, hemorrhage, hydrocephalus, extra-axial collection or mass lesion/mass effect. Vascular: Hyperdensity in the left MCA, near the bifurcation and in the proximal M2 branches (series 5, image 25-27), which may represent atherosclerotic disease and/or thrombus. Skull: Normal. Negative for fracture or focal lesion. Sinuses/Orbits: No acute finding. Other: The mastoids are well aerated. ASPECTS [facility] Stroke Program Early CT Score) - Ganglionic level infarction (caudate, lentiform nuclei, internal capsule, insula, M1-M3 cortex): 7 - Supraganglionic infarction (M4-M6 cortex): 3 Total score (0-10 with 10 being normal): 10 IMPRESSION: 1. Hyperdensity in the left MCA near the bifurcation and in the proximal M2 branches, which may represent atherosclerotic disease and/or thrombus. 2. ASPECTS is 10 Code stroke imaging results were communicated on 03/28/2022 at 1:14 pm to provider Dr. Cheral Marker via telephone, who verbally acknowledged these results. Electronically Signed   By: Merilyn Baba M.D.   On: 03/28/2022  13:17    PHYSICAL EXAM  Physical Exam  Constitutional: Appears well-developed and well-nourished.   Cardiovascular: Normal rate and regular rhythm.  Respiratory: Effort normal, non-labored breathing  Neuro: Mental Status: Intubated and sedated  Cranial Nerves: II: Pupils are equal, she does not track examiner, gaze to the left III,IV, VI: does not track examiner VIII: Hearing is intact to voice X: cough and gag intact XI: head is midline Motor: Tone is normal. Bulk is normal.  Right hemiplegia  LUE and LLE move antigravity Sensory: WTP in left upper and lower  ASSESSMENT/PLAN Elaine Ford is a 76 y.o. female with history of gallstones, HLD, HTN LBBB s/p pacemaker placement and hypotension, presenting to the ED via EMS with acute onset of right hemiplegia and aphasia presenting with via EMS after being found slumped towards the right on the couch by Elaine Ford. She received TNK and underwent a mechanical thrombectomy for a proximal left M1 occlusion. Repeat CT Head showed extensive stroke in the left MCA territory with cytotoxic edema and mild midline shift.  Stroke:  Left MCA infarct due to left M1 occlusion s/p TNK and IR with TICI3 complicated by small SAH, etiology unclear  Code Stroke CT head Hyperdensity in the left MCA near the bifurcation and in the proximal M2 branches CTA head & neck Occlusion of the left distal M1 with minimal flow in the proximal left M2 branches and poor perfusion of the remainder of the left MCA territory. Multifocal stenosis in the bilateral PCAs, which is severe at the origin of one of the left P3 branches, moderate in  the proximal right P1 and throughout the P2, and mild in the left P2. Poor perfusion of the right P3 segments. IR with TICI3 after 4 passes Post IR CT Small SAH and contrast pending x 2 Not able to have MRI due to pacer incompatible CT repeat pending 2D Echo 60-65%- normal LV function Pacemaker interrogation showed only 10  seconds of a flutter  LDL 104 HgbA1c 5.5 VTE prophylaxis - SCDs No antithrombotic prior to admission, now on no antithrombotics given small SAH. Will consider starting ASA in am Therapy recommendations:  pending Disposition:  pending  Respiratory failure Intubated before procedure Remain intubated after procedure due to mental status Extubated 9/19 after CT repeat Mild respiratory distress with secretions, no gag or cough High risk for reintubation Close ICU monitoring  Paroxysmal brief Aflutter Pacemaker interrogation showed a 10 seconds of Aflutter Discussed with EP, does not think enough evidence for anticoagulation We will start aspirin tomorrow Continue pacemaker monitoring  Hypertension Home meds:  Coreg '25mg'$  Stable BP goal < 160 Long-term BP goal normotensive  Hyperlipidemia LDL 104, goal < 70 Add Atorvastatin '40mg'$   Continue statin at discharge  Other Stroke Risk Factors Advanced Age >/= 16  Obesity, Body mass index is 33.6 kg/m., BMI >/= 30 associated with increased stroke risk, recommend weight loss, diet and exercise as appropriate   Other Active Problems LBBB on pacer Dysphagia  SLP to follow tomorrow   Hospital day # 1  Patient seen and examined by NP/APP with MD. MD to update note as needed.   Janine Ores, DNP, FNP-BC [facility] Pager: [phone]  ATTENDING NOTE: I reviewed above note and agree with the assessment and plan. Pt was seen and examined.   76 year old female with history of hypertension, hyperlipidemia, LBBB status post pacemaker admitted for right-sided weakness, aphasia.  CT no acute abnormality but left MCA hyperdense sign.  Status post TNK.  CTA head and neck left M1 occlusion, bilateral PCA severe stenosis, intracranial stenosis.  Status post IR with TICI3 after 4 passes but still has left M2 moderate stenosis.  CT repeat post IR showed contrast staining versus small SAH.  EF 60 to 65%, A1c 5.5, LDL 104.  Creatinine  0.86.  On exam, patient intubated on sedation, eyes closed, not following commands. With forced eye opening, eyes in left gaze position, not blinking to visual threat, doll's eyes sluggish, not tracking, pupil equal size but sluggish to light. Corneal reflex present, gag and cough weak. Breathing over the vent.  Facial symmetry not able to test due to ET tube.  Tongue protrusion not cooperative. On pain stimulation, mild withdrawal to left upper extremity. Sensation, coordination and gait not tested.  Pacemaker interrogation showed brief 10 seconds of a flutter, discussed with EP, no concerning evidence for anticoagulation at this time.  Not able to perform MRI due to incompatible pacemaker, will repeat CT at 24 hours of TNK.  Vent management per CCM.  Will start aspirin if repeat CT no significant bleeding.  BP goal less than 160.  Continue Lipitor.  For detailed assessment and plan, please refer to above/below as I have made changes wherever appropriate.   Rosalin Hawking, MD PhD Stroke Neurology 03/29/2022 9:35 PM  This patient is critically ill due to stroke, status post TNK and mechanical thrombectomy, SAH, respiratory failure and at significant risk of neurological worsening, death form recurrent stroke, hemorrhagic transformation, bleeding from TNK, worsening SAH, heart failure, respiratory failure. This patient's care requires constant monitoring of vital signs, hemodynamics,  respiratory and cardiac monitoring, review of multiple databases, neurological assessment, discussion with family, other specialists and medical decision making of high complexity. I spent 40 minutes of neurocritical care time in the care of this patient. I had long discussion with Ford at bedside, updated pt current condition, treatment plan and potential prognosis, and answered all the questions.  He expressed understanding and appreciation.  I also discussed with CCM Dr. Tacy Learn.      To contact Stroke Continuity  provider, please refer to [email or web address] After hours, contact General Neurology

## 2022-03-29 NOTE — Progress Notes (Signed)
SLP Cancellation Note  Patient Details Name: ELLICE BOULTINGHOUSE MRN: [id number] DOB: Oct 22, 1945   Cancelled treatment:        Attempted to see pt for cognitive-linguistic evaluation.  Pt intubated at time of attempt.  SLP to follow for medical readiness for assessment post extubation.  Please consider orders for swallow evaluation, if indicated, after extubation.   Celedonio Savage, MA, [place] Office: [phone] 03/29/2022, 11:11 AM

## 2022-03-29 NOTE — Procedures (Signed)
Extubation Procedure Note  Patient Details:   Name: Elaine Ford DOB: 04-15-1946 MRN: [id number]   Airway Documentation:    Vent end date: 03/29/22 Vent end time: 1650   Evaluation  O2 sats: stable throughout Complications: No apparent complications Patient did tolerate procedure well. Bilateral Breath Sounds: Diminished, Clear   Yes  RT extubated patient to 4L [place] per MD order. No cuff leak noted- proceed with extubation per MD. No spontaneous cough or gag post extubation, MD at bedside and aware. No obvious stridor at this time. No new orders for RT at this time. RT will continue to monitor as needed.   Fabiola Backer 03/29/2022, 6:20 PM

## 2022-03-29 NOTE — Anesthesia Postprocedure Evaluation (Signed)
Anesthesia Post Note  Patient: Elaine Ford  Procedure(s) Performed: IR WITH ANESTHESIA     Patient location during evaluation: SICU Anesthesia Type: General Level of consciousness: sedated Pain management: pain level controlled Vital Signs Assessment: post-procedure vital signs reviewed and stable Respiratory status: patient remains intubated per anesthesia plan Cardiovascular status: stable Postop Assessment: no apparent nausea or vomiting Anesthetic complications: no   No notable events documented.  Last Vitals:  Vitals:   03/29/22 0845 03/29/22 0900  BP: (!) 146/56 (!) 142/54  Pulse: 71 68  Resp: 20 (!) 23  Temp:    SpO2: 99% 99%    Last Pain:  Vitals:   03/29/22 0000  TempSrc: Esophageal                 Candis Kabel S

## 2022-03-29 NOTE — Progress Notes (Signed)
OT Cancellation Note  Patient Details Name: Elaine Ford MRN: [id number] DOB: 11-04-1945   Cancelled Treatment:    Reason Eval/Treat Not Completed: Active bedrest order (TNK protocol pt on bed rest for 24 hours starting 9/18 1329.)  Elliot Cousin 03/29/2022, 7:28 AM

## 2022-03-29 NOTE — Progress Notes (Signed)
Referring Physician(s): CODE STROKE  Supervising Physician: Pedro Earls  Patient Status:  [facility] - In-pt  Chief Complaint: Left M1/MCA occlusion s/p mechanical thrombectomy 03/28/22 by Dr. Karenann Cai   HPI: Patient with a medical history significant for HTN and left BBB s/p pacemaker placement presented to the ED via EMS 03/28/22 with acute onset of right hemiplegia and aphasia. Imaging showed a left MCA stroke. She presented within the window for TNK administration and this was given. She was also taken to [facility] where she underwent a diagnostic cerebral angiogram with mechanical thrombectomy of the proximal left M1/MCA occlusion by Dr. Karenann Cai.  Subjective: Intubated with sedation being weaned for breathing trial. She appears mildly restless. Her eyes are open but she is not following any commands or responding in any meaningful way. Her husband is at the bedside.   Allergies: Ace inhibitors, Codeine, Hydrocodone-acetaminophen, Lisinopril-hydrochlorothiazide, and Oxycodone-acetaminophen  Medications: Prior to Admission medications   Medication Sig Start Date End Date Taking? Authorizing Provider  Calcium Carbonate-Vitamin D 600-400 MG-UNIT tablet Take 1 tablet by mouth daily.   Yes [provider]  carvedilol (COREG) 25 MG tablet TAKE 1 TABLET (25 MG TOTAL) BY MOUTH 2 (TWO) TIMES DAILY. 09/01/16  Yes [name], MD  ibuprofen (ADVIL,MOTRIN) 600 MG tablet Take 1 tablet (600 mg total) by mouth every 6 (six) hours as needed for moderate pain. 04/21/16  Yes [name], DO  Multiple Vitamin (MULTIVITAMIN WITH MINERALS) TABS tablet Take 1 tablet by mouth daily.   Yes [provider]  nitroGLYCERIN (NITROSTAT) 0.4 MG SL tablet Place 1 tablet (0.4 mg total) under the tongue every 5 (five) minutes as needed for chest pain. 09/13/21 03/28/22 Yes [name], MD  VITAMIN E PO Take 1 capsule by mouth daily.    Yes [provider]   traMADol (ULTRAM) 50 MG tablet Take 1 tablet (50 mg total) by mouth every 6 (six) hours as needed. Patient not taking: Reported on 03/28/2022 01/25/21   Jaynee Eagles, PA-C     Vital Signs: BP (!) 133/51   Pulse 70   Temp 99.3 F (37.4 C)   Resp (!) 23   Ht '5\' 4"'$  (1.626 m)   Wt 195 lb 12.3 oz (88.8 kg)   SpO2 97%   BMI 33.60 kg/m   Physical Exam Constitutional:      General: She is not in acute distress.    Appearance: She is not ill-appearing.  Cardiovascular:     Rate and Rhythm: Normal rate and regular rhythm.     Comments: Right groin vascular site is clean, soft and dry.  Pulmonary:     Effort: Pulmonary effort is normal.     Comments: Intubated/ventilator  Skin:    General: Skin is warm and dry.  Neurological:     Mental Status: She is alert.     Comments: Left gaze preference. Spontaneous left arm/left leg movements. No movement observed on the right side. Patient did not follow any commands.      Imaging: DG Abd 1 View  Result Date: 03/28/2022 CLINICAL DATA:  Check gastric catheter placement EXAM: ABDOMEN - 1 VIEW COMPARISON:  None Available. FINDINGS: Gastric catheter is noted coiled within the stomach. Contrast is noted within the kidneys from prior arteriogram. No free air is seen. IMPRESSION: Gastric catheter within the stomach. Electronically Signed   By: Inez Catalina M.D.   On: 03/28/2022 21:57   CT HEAD WO CONTRAST (5MM)  Result Date: 03/28/2022 CLINICAL DATA:  Assess stability of hemorrhage EXAM: CT HEAD WITHOUT CONTRAST TECHNIQUE: Contiguous axial images were obtained from the base of the skull through the vertex without intravenous contrast. RADIATION DOSE REDUCTION: This exam was performed according to the departmental dose-optimization program which includes automated exposure control, adjustment of the mA and/or kV according to patient size and/or use of iterative reconstruction technique. COMPARISON:  03/28/2022 4:20 p.m. FINDINGS: Brain: Redemonstrated  hyperdensity involving the left basal ganglia, which is slightly decreased in conspicuity and favored to represent contrast staining rather than hemorrhage. Additional subarachnoid hyperdensity along the left cerebral hemisphere, particularly in the left sylvian fissure, is also decreased in conspicuity and may represent a combination of extravasated contrast and subarachnoid hemorrhage. No definite acute infarct, mass, or midline shift. Sulcal effacement in the left cerebral hemisphere, particularly the sylvian fissure, with increased prominence of the caudate compared to the earliest same day CT, likely reflecting edema and mild mass effect. Mild narrowing of the left lateral ventricle frontal horn. No hydrocephalus. Vascular: Contrast remains in the vascular system. No definite hyperdense vessel. Skull: Normal. Negative for fracture or focal lesion. Sinuses/Orbits: No acute finding. Other: The mastoids are well aerated. IMPRESSION: 1. Hyperdensity in the left basal ganglia is less conspicuous on this exam, favoring contrast staining rather than hemorrhage. 2. Subarachnoid hyperdensity along the left cerebral hemisphere is also slightly decreased in conspicuity and may represent a combination of subarachnoid hemorrhage and extravasated contrast. 3. Sulcal effacement and local mass effect in the left cerebral hemisphere, most likely edema, without midline shift. Electronically Signed   By: Merilyn Baba M.D.   On: 03/28/2022 20:02   DG CHEST PORT 1 VIEW  Result Date: 03/28/2022 CLINICAL DATA:  Intubation. EXAM: PORTABLE CHEST 1 VIEW COMPARISON:  07/25/2014 FINDINGS: Endotracheal tube tip is 12 mm from the carina, mildly low in positioning. Enteric tube is in place with tip below the diaphragm, not included in the field of view. Probable temperature probe in place. Left-sided pacemaker in place. Low lung volumes. Chronic cardiomegaly, unchanged. No pulmonary edema, focal airspace disease, large pleural effusion  or pneumothorax. IMPRESSION: 1. Endotracheal tube tip 12 mm from the carina, mildly low in positioning. Recommend retraction of 1-2 cm for optimal placement. Enteric tube in place with tip below the diaphragm, not included in the field of view. 2. Low lung volumes with chronic cardiomegaly. Electronically Signed   By: Keith Rake M.D.   On: 03/28/2022 18:06   CT HEAD WO CONTRAST (5MM)  Addendum Date: 03/28/2022   ADDENDUM REPORT: 03/28/2022 17:13 ADDENDUM: These results were called by telephone at the time of interpretation on 03/28/2022 at 5:00 pm to provider [facility] , who verbally acknowledged these results. Electronically Signed   By: Kellie Simmering D.O.   On: 03/28/2022 17:13   Result Date: 03/28/2022 CLINICAL DATA:  Provided history: Neuro deficit, acute, stroke suspected. EXAM: CT HEAD WITHOUT CONTRAST TECHNIQUE: Contiguous axial images were obtained from the base of the skull through the vertex without intravenous contrast. RADIATION DOSE REDUCTION: This exam was performed according to the departmental dose-optimization program which includes automated exposure control, adjustment of the mA and/or kV according to patient size and/or use of iterative reconstruction technique. COMPARISON:  Non-contrast head CT 03/28/2022. CT angiogram head/neck 03/28/2022. FINDINGS: Brain: Mild generalized parenchymal atrophy. Hyperdensity within the left caudate and lentiform nuclei, which may reflect contrast staining and/or hemorrhage. No significant associated mass effect at this time. Additionally, there is moderate-volume hyperdensity along the left  cerebral hemisphere, most notably within the sylvian fissure. This likely reflects a combination of subarachnoid hemorrhage and extravasated contrast. Findings have progressed from the postprocedural head CT performed earlier today at 3:29 p.m. Mild patchy and ill-defined hypoattenuation within the cerebral white matter, nonspecific but compatible with chronic  small vessel ischemic disease. Redemonstrated small chronic appearing infarct within the left cerebellar hemisphere. No midline shift or hydrocephalus. Vascular: Residual circulating contrast limits evaluation for hyperdense vessels. The thrombus/embolus which was previously present within the M1 left middle cerebral artery now appears to have migrated more distally into an M2 left MCA vessel (series 3, image 15). Skull: Normal. Negative for fracture or focal lesion. Sinuses/Orbits: No mass or acute finding within the imaged orbits. Mild mucosal thickening within the bilateral ethmoid air cells. Attempts are being made to reach the ordering provider at this time. IMPRESSION: Hyperdensity within the left caudate and lentiform nuclei, which may reflect contrast staining and/or hemorrhage. Moderate-volume hyperdensity along the left cerebral hemisphere, likely reflecting a combination of extravasated contrast and subarachnoid hemorrhage. Findings have progressed from the post-procedure head CT performed earlier today at 3:29 p.m. There is no significant associated mass effect at this time. No evidence of hydrocephalus. The thrombus/embolus which was previously present within the M1 left middle cerebral artery now appears to have migrated more distally into an M2 left MCA vessel. Electronically Signed: By: Kellie Simmering D.O. On: 03/28/2022 16:45   CT ANGIO HEAD NECK W WO CM (CODE STROKE)  Result Date: 03/28/2022 CLINICAL DATA:  Right-sided deficits, aphasia, concern for left-sided stroke EXAM: CT ANGIOGRAPHY HEAD AND NECK TECHNIQUE: Multidetector CT imaging of the head and neck was performed using the standard protocol during bolus administration of intravenous contrast. Multiplanar CT image reconstructions and MIPs were obtained to evaluate the vascular anatomy. Carotid stenosis measurements (when applicable) are obtained utilizing NASCET criteria, using the distal internal carotid diameter as the denominator.  RADIATION DOSE REDUCTION: This exam was performed according to the departmental dose-optimization program which includes automated exposure control, adjustment of the mA and/or kV according to patient size and/or use of iterative reconstruction technique. CONTRAST:  36m OMNIPAQUE IOHEXOL 350 MG/ML SOLN COMPARISON:  No prior CTA, correlation is made with CT head 03/28/2022 FINDINGS: CT HEAD FINDINGS For noncontrast findings, please see same day CT head. CTA NECK FINDINGS Aortic arch: Two-vessel arch with a common origin of the brachiocephalic and left common carotid arteries. Imaged portion shows no evidence of aneurysm or dissection. No significant stenosis of the major arch vessel origins. Aortic atherosclerosis. Right carotid system: No evidence of dissection, occlusion, or hemodynamically significant stenosis (greater than 50%). Atherosclerotic disease at the bifurcation and in the proximal ICA is not hemodynamically significant. Left carotid system: No evidence of dissection, occlusion, or hemodynamically significant stenosis (greater than 50%). Atherosclerotic disease at the bifurcation and in the proximal ICA is not hemodynamically significant. Vertebral arteries: Mild narrowing in the proximal right V1 segment. The vertebral arteries are otherwise patent to the skull base. Skeleton: No acute osseous abnormality. Other neck: No acute finding. Upper chest: No focal pulmonary opacity or pleural effusion. Review of the MIP images confirms the above findings CTA HEAD FINDINGS Anterior circulation: Both internal carotid arteries are patent to the termini, without significant stenosis. A1 segments patent. Normal anterior communicating artery. Anterior cerebral arteries are patent to their distal aspects. Occlusion of the distal left M1 (series 7, image 92) with minimal opacification in the proximal left M2 branches (series 7, image 90 and 93) but little  to no opacification past the origins or in the remainder of  the left MCA branches. Mild stenosis in the distal right M1 with mild-to-moderate stenosis in the proximal more superior right M2 branch. Right MCA branches otherwise perfused. Posterior circulation: Vertebral arteries patent to the vertebrobasilar junction with mild stenosis in the mid left V4 (series 7, image 152 posterior inferior cerebellar arteries patent proximally. Basilar patent to its distal aspect. Superior cerebellar arteries patent proximally. Patent P1 segments, with moderate stenosis in the proximal right P1 (series 7, image 100-101). Long segment moderate stenosis in the right P2 (series 7, images 101-106) and poor perfusion of the right P3 segments (series 7, images 102 and 104). Mild multifocal stenosis in the left P2 (series 7, image 105). Severe stenosis at the origin of the more lateral left P3 (series 7, images 101-104). The bilateral posterior communicating arteries are not visualized. Venous sinuses: As permitted by contrast timing, patent. Anatomic variants: None significant. Review of the MIP images confirms the above findings IMPRESSION: 1. Occlusion of the left distal M1 with minimal flow in the proximal left M2 branches and poor perfusion of the remainder of the left MCA territory. This correlates with the hyperdensity seen on the same-day CT head. 2. Multifocal stenosis in the bilateral PCAs, which is severe at the origin of one of the left P3 branches, moderate in the proximal right P1 and throughout the P2, and mild in the left P2. Poor perfusion of the right P3 segments. 3. Additional mild stenosis in the distal right M1, proximal M2, and in the left V4. 4. Mild stenosis of the proximal right V1 segment. No other hemodynamically significant stenosis in the neck. Code stroke imaging results were communicated on 03/28/2022 at 1:44 pm to provider Dr. Cheral Marker via telephone, who verbally acknowledged these results. Electronically Signed   By: Merilyn Baba M.D.   On: 03/28/2022 14:04   CT  HEAD CODE STROKE WO CONTRAST  Result Date: 03/28/2022 CLINICAL DATA:  Code stroke. EXAM: CT HEAD WITHOUT CONTRAST TECHNIQUE: Contiguous axial images were obtained from the base of the skull through the vertex without intravenous contrast. RADIATION DOSE REDUCTION: This exam was performed according to the departmental dose-optimization program which includes automated exposure control, adjustment of the mA and/or kV according to patient size and/or use of iterative reconstruction technique. COMPARISON:  None Available. FINDINGS: Brain: No evidence of acute infarction, hemorrhage, hydrocephalus, extra-axial collection or mass lesion/mass effect. Vascular: Hyperdensity in the left MCA, near the bifurcation and in the proximal M2 branches (series 5, image 25-27), which may represent atherosclerotic disease and/or thrombus. Skull: Normal. Negative for fracture or focal lesion. Sinuses/Orbits: No acute finding. Other: The mastoids are well aerated. ASPECTS [facility] Stroke Program Early CT Score) - Ganglionic level infarction (caudate, lentiform nuclei, internal capsule, insula, M1-M3 cortex): 7 - Supraganglionic infarction (M4-M6 cortex): 3 Total score (0-10 with 10 being normal): 10 IMPRESSION: 1. Hyperdensity in the left MCA near the bifurcation and in the proximal M2 branches, which may represent atherosclerotic disease and/or thrombus. 2. ASPECTS is 10 Code stroke imaging results were communicated on 03/28/2022 at 1:14 pm to provider Dr. Cheral Marker via telephone, who verbally acknowledged these results. Electronically Signed   By: Merilyn Baba M.D.   On: 03/28/2022 13:17    Labs:  CBC: Recent Labs    03/28/22 1300 03/28/22 1719  WBC 7.3  --   HGB 15.1*  15.6* 15.3*  HCT 47.0*  46.0 45.0  PLT 298  --  COAGS: Recent Labs    03/28/22 1300  INR 1.0  APTT 34    BMP: Recent Labs    03/28/22 1300 03/28/22 1719  NA 139  139 137  K 4.4  4.4 3.8  CL 105  104  --   CO2 23  --   GLUCOSE 144*   144*  --   BUN 12  13  --   CALCIUM 9.5  --   CREATININE 0.86  0.70  --   GFRNONAA >60  --     LIVER FUNCTION TESTS: Recent Labs    03/28/22 1300  BILITOT 0.8  AST 28  ALT 21  ALKPHOS 74  PROT 7.5  ALBUMIN 3.7    Assessment and Plan:  Left M1/MCA occlusion s/p mechanical thrombectomy 03/28/22 by Dr. Karenann Cai   Post-procedure CT head showed a possible SAH. Patient remains intubated with cleviprex infusing for blood pressure control. Patient demonstrates left gaze preference and spontaneous left-sided movements; no movements observed on the right and she does not follow commands.   Repeat CT head is pending. Patient has a pacemaker which is not compatible for MRI.   NIR will continue to follow.   Electronically Signed: Soyla Dryer, AGACNP-BC [phone] 03/29/2022, 11:36 AM   I spent a total of 15 Minutes at the the patient's bedside AND on the patient's hospital floor or unit, greater than 50% of which was counseling/coordinating care for left M1/MCA occlusion s/p mechanical thrombectomy

## 2022-03-29 NOTE — Progress Notes (Signed)
Pt's husband provided Pt's Pacemaker card with the following information:   Model number: [id number] Serial Number: [id number]  Implant Date: 07/24/2014 Physician: Cristopher Peru [phone]   RN called MRI to verify if pacemaker was MRI compatible. RN was made aware that pacemaker is not MRI compatible. MD notified.

## 2022-03-29 NOTE — Progress Notes (Signed)
RT transported patient from 4N23 to CT and back with RN. No complications. RT will continue to monitor.

## 2022-03-30 ENCOUNTER — Inpatient Hospital Stay (HOSPITAL_COMMUNITY): Payer: Medicare HMO

## 2022-03-30 DIAGNOSIS — J9620 Acute and chronic respiratory failure, unspecified whether with hypoxia or hypercapnia: Secondary | ICD-10-CM

## 2022-03-30 DIAGNOSIS — I63412 Cerebral infarction due to embolism of left middle cerebral artery: Secondary | ICD-10-CM | POA: Diagnosis not present

## 2022-03-30 DIAGNOSIS — I609 Nontraumatic subarachnoid hemorrhage, unspecified: Secondary | ICD-10-CM | POA: Diagnosis not present

## 2022-03-30 DIAGNOSIS — Z950 Presence of cardiac pacemaker: Secondary | ICD-10-CM | POA: Diagnosis not present

## 2022-03-30 DIAGNOSIS — E7800 Pure hypercholesterolemia, unspecified: Secondary | ICD-10-CM

## 2022-03-30 DIAGNOSIS — I484 Atypical atrial flutter: Secondary | ICD-10-CM | POA: Diagnosis not present

## 2022-03-30 LAB — BASIC METABOLIC PANEL
Anion gap: 8 (ref 5–15)
BUN: 10 mg/dL (ref 8–23)
CO2: 23 mmol/L (ref 22–32)
Calcium: 8.3 mg/dL — ABNORMAL LOW (ref 8.9–10.3)
Chloride: 108 mmol/L (ref 98–111)
Creatinine, Ser: 0.95 mg/dL (ref 0.44–1.00)
GFR, Estimated: >60 mL/min (ref >60)
Glucose, Bld: 106 mg/dL — ABNORMAL HIGH (ref 70–99)
Potassium: 3.9 mmol/L (ref 3.5–5.1)
Sodium: 139 mmol/L (ref 135–145)

## 2022-03-30 LAB — CBC
HCT: 37.6 % (ref 36.0–46.0)
Hemoglobin: 12.3 g/dL (ref 12.0–15.0)
MCH: 27.8 pg (ref 26.0–34.0)
MCHC: 32.7 g/dL (ref 30.0–36.0)
MCV: 85.1 fL (ref 80.0–100.0)
Platelets: 217 10*3/uL (ref 150–400)
RBC: 4.42 MIL/uL (ref 3.87–5.11)
RDW: 14.8 % (ref 11.5–15.5)
WBC: 11.5 10*3/uL — ABNORMAL HIGH (ref 4.0–10.5)
nRBC: 0 % (ref 0.0–0.2)

## 2022-03-30 MED ORDER — ASPIRIN 81 MG PO CHEW: 81.0000 mg | CHEWABLE_TABLET | Freq: Every day | ORAL | Status: DC

## 2022-03-30 MED ORDER — ATORVASTATIN CALCIUM 40 MG PO TABS: 40.0000 mg | ORAL_TABLET | Freq: Every day | ORAL | Status: DC

## 2022-03-30 MED ORDER — LABETALOL HCL 5 MG/ML IV SOLN
20.0000 mg | INTRAVENOUS | Status: DC | PRN
  Administered 2022-03-30 – 2022-03-31 (×5): 20 mg via INTRAVENOUS
  Filled 2022-03-30 (×6): qty 4

## 2022-03-30 MED ORDER — DOCUSATE SODIUM 50 MG/5ML PO LIQD
100.0000 mg | Freq: Two times a day (BID) | ORAL | Status: DC
  Administered 2022-03-31: 100 mg via ORAL
  Filled 2022-03-30 (×2): qty 10

## 2022-03-30 MED ORDER — LABETALOL HCL 5 MG/ML IV SOLN: 20.0000 mg | INTRAVENOUS | Status: DC | PRN

## 2022-03-30 MED ORDER — SODIUM CHLORIDE 0.9 % IV SOLN: INTRAVENOUS | Status: DC

## 2022-03-30 MED ORDER — ASPIRIN 300 MG RE SUPP
150.0000 mg | Freq: Every day | RECTAL | Status: DC
  Administered 2022-03-30 – 2022-03-31 (×2): 150 mg via RECTAL
  Filled 2022-03-30 (×2): qty 1

## 2022-03-30 NOTE — Progress Notes (Signed)
  Transition of Care [facility]) Screening Note   Patient Details  Name: [name] Date of Birth: [birth year]   [facility]) CM/SW Contact:    Benard Halsted, LCSW Phone Number: 03/30/2022, 9:56 AM    Transition of Care Department [facility]) has reviewed patient. We will continue to monitor patient advancement through interdisciplinary progression rounds. If new patient transition needs arise, please place a TOC consult.

## 2022-03-30 NOTE — Evaluation (Signed)
Speech Language Pathology Evaluation Patient Details Name: KAMORA VOSSLER MRN: [id number] DOB: 11/02/1945 Today's Date: 03/30/2022 Time: 0850-0910 SLP Time Calculation (min) (ACUTE ONLY): 20 min  Problem List:  Patient Active Problem List   Diagnosis Date Noted   Stroke ([place]) 03/28/2022   Stroke (cerebrum) ([place]) 03/28/2022   Postmenopausal bleeding 04/20/2016   Pacemaker-St Jude 07/26/2014   Pacemaker lead microperforation  07/26/2014   Complete heart block ([place]) 07/24/2014   Obesity 01/22/2014   Chest pain 05/30/2013   Abnormal EKG 05/30/2013   LBBB (left bundle branch block) 05/30/2013   Hyperlipidemia 05/30/2013   Essential hypertension 05/30/2013   Murmur, cardiac 05/30/2013   Past Medical History:  Past Medical History:  Diagnosis Date   Back pain    Broken arm    age 41 years old broke right arm after a fall   Gall bladder stones    Gestational diabetes    Hemorrhoid    Hx of cardiac cath    [facility] 07/24/14 at [facility] - no significant CAD   Hx of cardiovascular stress test    Myoview 11/14:  No ischemia   Hx of echocardiogram    Echo 07/23/14 - at [facility]:  EF 65-70%, impaired relaxation, mild LAE, mild MR, mild AI, mild elevated PASP (44.2 mmHg)   Hyperlipidemia    Hypertension    LBBB (left bundle branch block)    Low blood pressure    Nerve damage    right arm   Pain    Left upper chest s/p pacer maker insertion site 07/2014   S/P cardiac pacemaker procedure 07/2014   St. Jude - Dr. Lovena Le   Stroke [facility])    patient was unaware noted prior to pacermaker placement unknown when it occurred   Past Surgical History:  Past Surgical History:  Procedure Laterality Date   COLONOSCOPY     DILATATION & CURETTAGE/HYSTEROSCOPY WITH MYOSURE N/A 02/29/2016   Procedure: DILATATION & CURETTAGE/HYSTEROSCOPY WITH MYOSURE;  Surgeon: [name], DO;  Location: [facility];  Service: Gynecology;  Laterality: N/A;  patient has a pacemaker    GALLBLADDER SURGERY     IR 3D INDEPENDENT WKST   03/28/2022   IR PERCUTANEOUS ART THROMBECTOMY/INFUSION INTRACRANIAL INC DIAG ANGIO  03/28/2022   IR US GUIDE VASC ACCESS RIGHT  03/28/2022   LAPAROSCOPIC BILATERAL SALPINGO OOPHERECTOMY  04/20/2016   Procedure: LAPAROSCOPIC BILATERAL SALPINGO OOPHORECTOMY;  Surgeon: [name], DO;  Location: [facility];  Service: Gynecology;;   LEAD REVISION N/A 07/25/2014   Procedure: LEAD REVISION;  Surgeon: Deboraha Sprang, MD;  Location: [facility] CATH LAB;  Service: Cardiovascular;  Laterality: N/A;   PERMANENT PACEMAKER INSERTION N/A 07/24/2014   Procedure: PERMANENT PACEMAKER INSERTION;  Surgeon: [name], MD;  Location: [facility] CATH LAB;  Service: Cardiovascular;  Laterality: N/A;   RADIOLOGY WITH ANESTHESIA N/A 03/28/2022   Procedure: IR WITH ANESTHESIA;  Surgeon: Radiologist, Medication, MD;  Location: [facility];  Service: Radiology;  Laterality: N/A;   VAGINAL HYSTERECTOMY  04/20/2016   Procedure: HYSTERECTOMY VAGINAL;  Surgeon: [name], DO;  Location: [facility];  Service: Gynecology;;   HPI:  9/18 admit for CVA.  CT of the head demonstrated hypodensity in the left MCA without hemorrhage.   Systemic TNK mechanical thrombectomy.  Post procedurally on vent to ICU. Postoperative CT did demonstrate small subarachnoid hemorrhage.Extubated 9/19. No MRI at this time.   Assessment / Plan / Recommendation Clinical Impression  Pt demonstrates moderate to severe aphasia (assessed in Spanish), though signs of improvement during  assessment noted. Pt initially nonverbal, unable to follow commands, but seemed to comprehend with some groping that could suggest apraxia. Attempted Melodic intonation techniques, pt started trying to phonate and shape words with singing happy birthday, then able to count to 10 and then started repeating names and phrases. Still not naming, only repeating question at end of session. Pt recommended to f/u with AIR. SLP will follow acutely.    SLP Assessment  SLP Visit Diagnosis: Dysphagia, unspecified  (R13.10)    Recommendations for follow up therapy are one component of a multi-disciplinary discharge planning process, led by the attending physician.  Recommendations may be updated based on patient status, additional functional criteria and insurance authorization.    Follow Up Recommendations  Acute inpatient rehab (3hours/day)    Assistance Recommended at Discharge  Frequent or constant Supervision/Assistance  Functional Status Assessment Patient has had a recent decline in their functional status and demonstrates the ability to make significant improvements in function in a reasonable and predictable amount of time.  Frequency and Duration min 2x/week  2 weeks      SLP Evaluation Cognition  Overall Cognitive Status: Difficult to assess Arousal/Alertness: Awake/alert Orientation Level: Other (comment) (unable) Attention: Focused;Sustained Focused Attention: Appears intact Sustained Attention: Appears intact       Comprehension  Auditory Comprehension Overall Auditory Comprehension: Impaired Yes/No Questions: Impaired Basic Biographical Questions: 0-25% accurate Commands: Impaired One Step Basic Commands: 0-24% accurate    Expression Verbal Expression Overall Verbal Expression: Impaired Initiation: Impaired Automatic Speech: Name;Social Response;Counting Level of Generative/Spontaneous Verbalization: Word Repetition: No impairment Naming: Impairment   Oral / Motor  Oral Motor/Sensory Function Overall Oral Motor/Sensory Function: Moderate impairment Facial ROM: Reduced right;Suspected CN VII (facial) dysfunction Facial Symmetry: Abnormal symmetry right;Suspected CN VII (facial) dysfunction Facial Strength: Reduced right;Suspected CN VII (facial) dysfunction Lingual ROM: Other (Comment) Motor Speech Overall Motor Speech: Impaired Respiration: Within functional limits Phonation: Hoarse;Breathy Articulation: Impaired Level of Impairment: Word            Leeba Barbe,  Katherene Ponto 03/30/2022, 10:53 AM

## 2022-03-30 NOTE — Progress Notes (Signed)
Inpatient Rehab Admissions Coordinator:   Per therapy recommendations,  patient was screened for CIR candidacy Clemens Catholic, MS, CCC-SLP. At this time, Pt. Appears to be a a potential candidate for CIR. I will place   order for rehab consult per protocol for full assessment. Please contact me any with questions.  Clemens Catholic, [place], [place] Admissions Coordinator  [phone] ([place]) [phone] (office)

## 2022-03-30 NOTE — Progress Notes (Addendum)
NAME:  Elaine Ford, MRN:  [id number], DOB:  [birth year], LOS: 2 ADMISSION DATE:  03/28/2022, CONSULTATION DATE:  9/18 REFERRING MD:  Dr. Cheral Marker, CHIEF COMPLAINT:  CVA   History of Present Illness:  Patient is encephalopathic and/or intubated. Therefore history has been obtained from chart review.  76 year old female with past medical history as below, which is significant for diabetes, hypertension, hyperlipidemia, followed by block status post pacemaker, and hypotension.  She was in her usual state of health until 10 AM on 9/18.  She presented most emergency department that same day at approximately 1:00 PM with complaints of acute onset right hemiplegia and aphasia.  Her husband found her slumped over to the right on the couch at home.  Upon arrival to the ED she was confirmed to be aphasic with severe right-sided weakness.  Exam was concerning for left MCA territory infarct.  CT of the head demonstrated hypodensity in the left MCA without hemorrhage.  The patient was administered systemic TNK.  CT angiogram and perfusion deemed the patient a favorable candidate for mechanical thrombectomy.  She was taken to IR where she underwent mechanical thrombectomy of proximal left M1/MCA occlusion with a TICI 3 result.  Postoperative CT did demonstrate small subarachnoid hemorrhage.  She was transferred to the ICU on mechanical ventilator for recovery.   Pertinent  Medical History   has a past medical history of Back pain, Broken arm, Gall bladder stones, Gestational diabetes, Hemorrhoid, cardiac cath, cardiovascular stress test, echocardiogram, Hyperlipidemia, Hypertension, LBBB (left bundle branch block), Low blood pressure, Nerve damage, Pain, S/P cardiac pacemaker procedure (07/2014), and Stroke [facility]).   Significant Hospital Events: Including procedures, antibiotic start and stop dates in addition to other pertinent events   9/18 admit for CVA.  Systemic TNK mechanical thrombectomy.  Post procedurally on  vent to ICU. 9/19 extubated  Interim History / Subjective:  Extubated yesterday, tolerating well. Following some commands per RN.   Objective   Blood pressure (!) 142/56, pulse 66, temperature 99.3 F (37.4 C), temperature source Axillary, resp. rate 17, height '5\' 4"'$  (1.626 m), weight 88.8 kg, SpO2 98 %.    Vent Mode: PSV;CPAP FiO2 (%):  [40 %] 40 % PEEP:  [5 cmH20] 5 cmH20 Pressure Support:  [5 cmH20] 5 cmH20   Intake/Output Summary (Last 24 hours) at 03/30/2022 0853 Last data filed at 03/30/2022 0700 Gross per 24 hour  Intake 2166.84 ml  Output 960 ml  Net 1206.84 ml    Filed Weights   03/28/22 1200  Weight: 88.8 kg    Examination: General: obese elderly female in NAD HEENT: [place]/AT, PERRL, no JVD Neuro: Awake, alert, L gaze preference. Moving L upper and lower intemittently to command with family translating. RLE weak. No movement of R upper. Flicker to pain.  Chest: Clear bilateral breath sounds Heart: RRR, no MRG Abdomen: Soft, NT, ND Skin: Grossly intact.   Resolved Hospital Problem list   Acute respiratory insufficiency  Assessment & Plan:  Acute left MCA stroke s/p TNK and thrombectomy  Postprocedure subarachnoid hemorrhage with contrast extravasation  - Neuro checks - Hold antiplatelet and anticoagulation for now - Patient has paced maker which is not compatible with MRI - Continue secondary stroke prophylaxis - Echocardiogram is pending - SLP eval pending MBS  Acute encephalopathy in the setting of acute stroke and subarachnoid hemorrhage - Neuro checks as above  Complete heart block: s/p pacemaker: paced on monitor. EKG unchanged. - telemetry  Hypertension - Permissive hypertension up to SBP 180 -  off cleviprex  Obesity - Dietitian follow-up  Best Practice (right click and "Reselect all SmartList Selections" daily)   Diet/type: NPO DVT prophylaxis: SCD GI prophylaxis: PPI Lines: N/A Foley:  N/A Code Status:  full code Last date of  multidisciplinary goals of care discussion [9/19: With patient's husband, decision was to continue full scope of care]  Labs   CBC: Recent Labs  Lab 03/28/22 1300 03/28/22 1719 03/30/22 0255  WBC 7.3  --  11.5*  NEUTROABS 5.4  --   --   HGB 15.1*  15.6* 15.3* 12.3  HCT 47.0*  46.0 45.0 37.6  MCV 86.6  --  85.1  PLT 298  --  217     Basic Metabolic Panel: Recent Labs  Lab 03/28/22 1300 03/28/22 1719 03/30/22 0255  NA 139  139 137 139  K 4.4  4.4 3.8 3.9  CL 105  104  --  108  CO2 23  --  23  GLUCOSE 144*  144*  --  106*  BUN 12  13  --  10  CREATININE 0.86  0.70  --  0.95  CALCIUM 9.5  --  8.3*    GFR: Estimated Creatinine Clearance: 54.3 mL/min (by C-G formula based on SCr of 0.95 mg/dL). Recent Labs  Lab 03/28/22 1300 03/30/22 0255  WBC 7.3 11.5*     Liver Function Tests: Recent Labs  Lab 03/28/22 1300  AST 28  ALT 21  ALKPHOS 74  BILITOT 0.8  PROT 7.5  ALBUMIN 3.7    No results for input(s): "LIPASE", "AMYLASE" in the last 168 hours. No results for input(s): "AMMONIA" in the last 168 hours.  ABG    Component Value Date/Time   PHART 7.453 (H) 03/28/2022 1719   PCO2ART 34.4 03/28/2022 1719   PO2ART 134 (H) 03/28/2022 1719   HCO3 24.8 03/28/2022 1719   TCO2 26 03/28/2022 1719   O2SAT 99 03/28/2022 1719     Coagulation Profile: Recent Labs  Lab 03/28/22 1300  INR 1.0     Cardiac Enzymes: No results for input(s): "CKTOTAL", "CKMB", "CKMBINDEX", "TROPONINI" in the last 168 hours.  HbA1C: Hemoglobin A1C  Date/Time Value Ref Range Status  07/22/2014 05:34 PM 5.6 4.2 - 6.3 % Final    Comment:    The American Diabetes Association recommends that a primary goal of therapy should be <7% and that physicians should reevaluate the treatment regimen in patients with HbA1c values consistently >8%.    Hgb A1c MFr Bld  Date/Time Value Ref Range Status  03/28/2022 01:00 PM 5.5 4.8 - 5.6 % Final    Comment:    (NOTE) Pre diabetes:           5.7%-6.4%  Diabetes:              >6.4%  Glycemic control for   <7.0% adults with diabetes     CBG: Recent Labs  Lab 03/28/22 1254  GLUCAP 149*      Georgann Housekeeper, AGACNP-BC [facility]  See Amion for personal pager PCCM on call pager [phone] until 7pm. Please call Elink 7p-7a. [phone]  03/30/2022 9:16 AM

## 2022-03-30 NOTE — Progress Notes (Addendum)
STROKE TEAM PROGRESS NOTE   INTERVAL HISTORY Patient is seen in her room with her husband at the bedside.  She has been hemodynamically stable overnight.  She was extubated yesterday and is breathing well on room air.  Vitals:   03/30/22 0930 03/30/22 0945 03/30/22 1000 03/30/22 1159  BP:  (!) 176/73 (!) 160/60   Pulse: 67 70 66   Resp: (!) 0 11 15   Temp:    99.2 F (37.3 C)  TempSrc:    Axillary  SpO2: 97% 95% 96%   Weight:      Height:       CBC:  Recent Labs  Lab 03/28/22 1300 03/28/22 1719 03/30/22 0255  WBC 7.3  --  11.5*  NEUTROABS 5.4  --   --   HGB 15.1*  15.6* 15.3* 12.3  HCT 47.0*  46.0 45.0 37.6  MCV 86.6  --  85.1  PLT 298  --  878    Basic Metabolic Panel:  Recent Labs  Lab 03/28/22 1300 03/28/22 1719 03/30/22 0255  NA 139  139 137 139  K 4.4  4.4 3.8 3.9  CL 105  104  --  108  CO2 23  --  23  GLUCOSE 144*  144*  --  106*  BUN 12  13  --  10  CREATININE 0.86  0.70  --  0.95  CALCIUM 9.5  --  8.3*    Lipid Panel:  Recent Labs  Lab 03/29/22 0839  CHOL 213*  TRIG 364*  HDL 36*  CHOLHDL 5.9  VLDL 73*  LDLCALC 104*   HgbA1c:  Recent Labs  Lab 03/28/22 1300  HGBA1C 5.5    Urine Drug Screen: No results for input(s): "LABOPIA", "COCAINSCRNUR", "LABBENZ", "AMPHETMU", "THCU", "LABBARB" in the last 168 hours.  Alcohol Level  Recent Labs  Lab 03/28/22 1300  ETH <10     IMAGING past 24 hours CT HEAD WO CONTRAST (5MM)  Result Date: 03/29/2022 CLINICAL DATA:  Stroke follow-up EXAM: CT HEAD WITHOUT CONTRAST TECHNIQUE: Contiguous axial images were obtained from the base of the skull through the vertex without intravenous contrast. RADIATION DOSE REDUCTION: This exam was performed according to the departmental dose-optimization program which includes automated exposure control, adjustment of the mA and/or kV according to patient size and/or use of iterative reconstruction technique. COMPARISON:  03/28/2022 FINDINGS: Brain: Hypodensity  in the left basal ganglia, insula, and anterior left temporal lobe, likely edema related to the left MCA occlusion and infarct. Slightly increased mass effect on the left lateral ventricle compared to 03/28/2022, with overall unchanged narrowing of the left sylvian fissure. No significant midline shift. No hydrocephalus. The basal cisterns are patent. No definite hyperdensity remains in the left basal ganglia, which was likely represented contrast staining. A small amount of hyperdensity is noted in the left sylvian fissure (series 3, image 20) and in the left frontal and temporal sulci (series 3, image 21), likely subarachnoid hemorrhage; the amount of hyperdense material in this area has decreased compared to the prior exams, also likely related to dissipating contrast staining. Vascular: No hyperdense vessel. Skull: Normal. Negative for fracture or focal lesion. Sinuses/Orbits: Bubbly fluid in the right sphenoid sinus, which may be related to intubation. The orbits are unremarkable. Other: The mastoids are well aerated. IMPRESSION: 1. Increased edema in the left MCA territory, primarily affecting the left temporal lobe and basal ganglia, with slightly increased mass effect on the left lateral ventricle but no evidence of hydrocephalus or midline shift.  2. Decreased hyperdensity in the left sylvian fissure and left cerebral sulci, likely reflecting a small amount of subarachnoid hemorrhage with dissipation of prior contrast staining. Hyperdensity in the left basal ganglia on the prior exams is no longer evident and favored to have primarily represented contrast staining. Electronically Signed   By: Merilyn Baba M.D.   On: 03/29/2022 16:02    PHYSICAL EXAM  Physical Exam  Constitutional: Appears well-developed and well-nourished.   Cardiovascular: Normal rate and regular rhythm.  Respiratory: Effort normal, non-labored breathing   NEURO:  Mental Status: difficult to assess due to  aphasia Speech/Language: Global aphasia but can mimic  Cranial Nerves:  II: PERRL. Does not blink to threat on the right III, IV, VI: left gaze deviation, cannot cross midline  VII: Smile is symmetrical.   VIII: hearing intact to voice. XII: tongue is midline without fasciculations. Motor: 5/5 strength to LUE and LLE, 0/5 to RUE and RLE  Sensation- Appears intact to light touch bilaterally.  Gait- deferred   ASSESSMENT/PLAN [name] is a 76 y.o. female with history of gallstones, HLD, HTN LBBB s/p pacemaker placement and hypotension, presenting to the ED via EMS with acute onset of right hemiplegia and aphasia presenting with via EMS after being found slumped towards the right on the couch by her husband. She received TNK and underwent a mechanical thrombectomy for a proximal left M1 occlusion. Repeat CT Head showed extensive stroke in the left MCA territory with cytotoxic edema and mild midline shift.  Stroke:  Left MCA infarct due to left M1 occlusion s/p TNK and IR with TICI3 complicated by small SAH, etiology unclear  Code Stroke CT head Hyperdensity in the left MCA near the bifurcation and in the proximal M2 branches CTA head & neck Occlusion of the left distal M1 with minimal flow in the proximal left M2 branches and poor perfusion of the remainder of the left MCA territory. Multifocal stenosis in the bilateral PCAs, which is severe at the origin of one of the left P3 branches, moderate in the proximal right P1 and throughout the P2, and mild in the left P2. Poor perfusion of the right P3 segments. IR with TICI3 after 4 passes Post IR CT Small SAH and contrast pending x 2 Not able to have MRI due to pacer incompatible CT repeat 9/19 Increased edema in left MCA territory with no midline shift, likely small SAH in left sylvian fissure and left cerebral sulci 2D Echo 60-65%- normal LV function Pacemaker interrogation showed only 10 seconds of a flutter  LDL 104 HgbA1c 5.5 VTE  prophylaxis - SCDs No antithrombotic prior to admission, now on aspiring suppository daily Therapy recommendations: CIR Disposition:  pending  Respiratory failure Intubated before procedure Remain intubated after procedure due to mental status Extubated 9/19 after CT repeat Mild respiratory distress with secretions, no gag or cough High risk for reintubation Close ICU monitoring  Paroxysmal brief Aflutter Pacemaker interrogation showed a 10 seconds of Aflutter Discussed with EP, does not think enough evidence for anticoagulation Aspirin 81 mg daily Continue pacemaker monitoring  Hypertension Home meds:  Coreg '25mg'$  Stable BP goal < 160 Long-term BP goal normotensive  Hyperlipidemia LDL 104, goal < 70 Resume atorvastatin '40mg'$  once p.o. access Continue statin at discharge  Dysphagia Did not pass a swallow On IV fluid May need core track tomorrow Speech on board  Other Stroke Risk Factors Advanced Age >/= 67  Obesity, Body mass index is 33.6 kg/m., BMI >/= 30 associated  with increased stroke risk, recommend weight loss, diet and exercise as appropriate   Other Active Problems LBBB on pacer  Hospital day # 2  Patient seen and examined by NP/APP with MD. MD to update note as needed.   S[place] , MSN, AGACNP-BC [facility] See Amion for schedule and pager information 03/30/2022 1:04 PM   ATTENDING NOTE: I reviewed above note and agree with the assessment and plan. Pt was seen and examined.   Husband at bedside.  Patient lethargic, lying in bed, extubated yesterday, so far tolerating well although risk of respiratory decline and needing reintubation.  Patient still largely global aphasia, nonverbal, able to mimic 1-2 commands with repetitive prompt.  Left upper extremity against gravity, left lower extremity dysfunction movement, right upper and lower extremity flaccid.  Repeat CT yesterday showed small left sylvian fissure SAH, started  aspirin.  Patient did not pass swallow, still on aspirin suppository.  Likely need a core track tomorrow, put on gentle IV fluid.  For detailed assessment and plan, please refer to above/below as I have made changes wherever appropriate.   Rosalin Hawking, MD PhD Stroke Neurology 03/30/2022 7:28 PM  This patient is critically ill due to stroke, status post TNK and mechanical thrombectomy, SAH, respiratory failure and at significant risk of neurological worsening, death form recurrent stroke, hemorrhagic transformation, bleeding from TNK, worsening SAH, heart failure, respiratory failure. This patient's care requires constant monitoring of vital signs, hemodynamics, respiratory and cardiac monitoring, review of multiple databases, neurological assessment, discussion with family, other specialists and medical decision making of high complexity. I spent 40 minutes of neurocritical care time in the care of this patient. I had long discussion with husband at bedside, updated pt current condition, treatment plan and potential prognosis, and answered all the questions.  He expressed understanding and appreciation.  I also discussed with CCM Dr. Tacy Learn.  To contact Stroke Continuity provider, please refer to [email or web address] After hours, contact General Neurology

## 2022-03-30 NOTE — Progress Notes (Addendum)
Notified CCM of pt's decreased urine output.   1600-2200 u/o= 200 mL 2200-2300 u/o= 25 mL 2300-0000 u/o= 10 mL  No new orders at this time.

## 2022-03-30 NOTE — Evaluation (Signed)
Occupational Therapy Evaluation Patient Details Name: Elaine Ford MRN: [id number] DOB: [birth year] Today's Date: 03/30/2022   History of Present Illness Pt is a 76 y.o. female who presented 03/28/22 with R-sided weakness and aphasia. Pt administered TNK. S/p diagnostic cerebral angiogram and mechanical thrombectomy, complicated by small [facility] 9/18. ETT 9/18-9/19. Noted to have left MCA infarct due to left M1 occlusion. PMHx of gallstones, gestational diabetes, HLD, HTN LBBB s/p pacemaker placement and hypotension, CVA   Clinical Impression   Pt currently max assist to max +2 for stand pivot transfers, simulated toileting tasks, and simulated selfcare sit to stand.  Left gaze preference with left head turn noted with max demonstrational cueing to turn head to the right and maintain visual gaze just right of midline.  No verbal expression noted or head nods "yes/no" in [place] or with spouse interpreting.  Brunnstrum stage I noted in the right arm and hand at this time with no active movement.  Feel pt will benefit from acute care OT to help address these deficits in order to help regain independence to a level her spouse and family can assist at home.  Recommend AIR level rehab for more intense therapy to maximize progress to reach min assist level for safety.        Recommendations for follow up therapy are one component of a multi-disciplinary discharge planning process, led by the attending physician.  Recommendations may be updated based on patient status, additional functional criteria and insurance authorization.   Follow Up Recommendations  Acute inpatient rehab (3hours/day)    Assistance Recommended at Discharge Frequent or constant Supervision/Assistance  Patient can return home with the following A lot of help with walking and/or transfers;A lot of help with bathing/dressing/bathroom;Assistance with cooking/housework;Help with stairs or ramp for entrance;Assist for transportation;Direct  supervision/assist for financial management;Direct supervision/assist for medications management    Functional Status Assessment  Patient has had a recent decline in their functional status and demonstrates the ability to make significant improvements in function in a reasonable and predictable amount of time.  Equipment Recommendations  Other (comment) (TBD next venue of care)    Recommendations for Other Services Rehab consult     Precautions / Restrictions Precautions Precautions: Fall;Other (comment) Precaution Comments: SBP < 160 Restrictions Weight Bearing Restrictions: No      Mobility Bed Mobility Overal bed mobility: Needs Assistance Bed Mobility: Sit to Supine       Sit to supine: Mod assist, +2 for physical assistance        Transfers Overall transfer level: Needs assistance Equipment used: 2 person hand held assist Transfers: Sit to/from Stand, Bed to chair/wheelchair/BSC Sit to Stand: Max assist     Step pivot transfers: +2 safety/equipment, Max assist     General transfer comment: Pt able to stand from the recliner with max assist.  Mod facilitation to advance the RLE with pivot transfer, however pt with no buckling of the right knee noted.      Balance Overall balance assessment: Needs assistance Sitting-balance support: Single extremity supported, Feet supported Sitting balance-Leahy Scale: Poor Sitting balance - Comments: Pt able to maintain static balance with min assist but needs greater assist for dynamic sitting balance with reciprical scooting   Standing balance support: Single extremity supported, During functional activity Standing balance-Leahy Scale: Poor Standing balance comment: Needs max assist from therapist for balance during transfers.  ADL either performed or assessed with clinical judgement   ADL Overall ADL's : Needs assistance/impaired     Grooming: Wash/dry  face;Sitting;Supervision/safety Grooming Details (indicate cue type and reason): supported sitting with use of the LUE     Lower Body Bathing: Maximal assistance;+2 for safety/equipment;Sit to/from stand Lower Body Bathing Details (indicate cue type and reason): simulated     Lower Body Dressing: Maximal assistance;+2 for safety/equipment;Sit to/from stand Lower Body Dressing Details (indicate cue type and reason): simulated Toilet Transfer: Maximal assistance;Stand-pivot Toilet Transfer Details (indicate cue type and reason): simulated chair to bed Toileting- Clothing Manipulation and Hygiene: Maximal assistance;+2 for safety/equipment Toileting - Clothing Manipulation Details (indicate cue type and reason): simulated     Functional mobility during ADLs: Maximal assistance;+2 for safety/equipment;Cueing for sequencing;Cueing for safety (stand pivot to the bed) General ADL Comments: Pt in recliner to start with spouse and children present.  Left head turn and eye gaze at rest with overall max demonstrational cueing to turn head to the right and bring eyes just past midline to the left to see her spouse and therapist.  Once stimulus came in on the left side, she immediately turned her head and eyes back to the left.  Decreased ability to follow one step commands.  She was able to use the LUE to assist with washing her face once therapist cued her verbally and placed washcloth toward her hand.  Min assist for reciprically scooting to the left hip to the EOC with max assist on the right.  Noted slight pusher tendencies to the right as well in sitting and with standing.  Educated spouse on gentle PROM for the shoulder, elbow, and digits.  Will provide handout next visit for reference.     Vision Baseline Vision/History: 1 Wears glasses Ability to See in Adequate Light: 0 Adequate Patient Visual Report: Other (comment) (pt unable to state) Vision Assessment?: Vision impaired- to be further tested  in functional context Additional Comments: Noted pt with head turn and gaze to the left.  Mod to max demonstrational cueing to assist with head turn to the right and eyes would track to slight right of midline.  Will continue to assess further in treatment.     Perception Perception Perception: Impaired Inattention/Neglect: Does not attend to right side of body;Does not attend to right visual field;Impaired-to be further tested in functional context   Praxis Praxis Praxis: Impaired Praxis Impairment Details: Initiation Praxis-Other Comments: Decreased initiation to instructional command, will continue to assess to determine apraxia vs receptive communication.    Pertinent Vitals/Pain Pain Assessment Pain Assessment: Faces Faces Pain Scale: No hurt     Hand Dominance Right   Extremity/Trunk Assessment Upper Extremity Assessment Upper Extremity Assessment: RUE deficits/detail RUE Deficits / Details: Slight edema developing in the left forearm and hand.  Brunnstrum stage I noted in the arm and hand as well with no active movement.  Slight tone beginning to develop in digit flexion and internal rotation.  Able to passively complete ROM in all digits, elbow, and shoulder flexion.  Did not range passed approximately 90 degrees shoulder flexion secondary to not being able to evaluate scapular activation and ROM.  Willl continue to assess in treatment. RUE Sensation:  (unable to accurately determine secondary to cognition) RUE Coordination: decreased fine motor;decreased gross motor (no active movement noted in the arm or hand)   Lower Extremity Assessment Lower Extremity Assessment: Defer to PT evaluation   Cervical / Trunk Assessment Cervical / Trunk Assessment:  (thoracic  rounding in sitting with increased pushing to the right noted and cervical flexion with slight right tilt)   Communication Communication Communication: Expressive difficulties   Cognition Arousal/Alertness:  Awake/alert Behavior During Therapy: Flat affect Overall Cognitive Status: Difficult to assess Area of Impairment: Following commands, Problem solving                       Following Commands: Follows one step commands inconsistently (inconsistent with following commands with in English or with spouse translating.)     Problem Solving: Decreased initiation, Requires tactile cues, Requires verbal cues General Comments: Pt non-verbal throughout with no head shaking noted for "no/yes" responses.  Decreased initiation of grooming tasks to verbal instruction but when given instruction and tactile cueing to the left hand, pt was able to take washcloth and wash her face.                Home Living Family/patient expects to be discharged to:: Private residence Living Arrangements: Spouse/significant other Available Help at Discharge: Family;Available 24 hours/day Type of Home: House Home Access: Stairs to enter [employer] of Steps: 1 Entrance Stairs-Rails: None Home Layout: Two level;1/2 bath on main level;Bed/bath upstairs;Able to live on main level with bedroom/bathroom (room for hospital bed or other bed on the first floor) Alternate Level Stairs-Number of Steps: 13 Alternate Level Stairs-Rails: Left (ascending) Bathroom Shower/Tub: Tub/shower unit (upstairs)   Bathroom Toilet: Handicapped height Bathroom Accessibility: Yes   Home Equipment: Grab bars - toilet;Grab bars - tub/shower;Cane - single point;Shower seat   Additional Comments: Pt was driving as well  Lives With: Spouse    Prior Functioning/Environment Prior Level of Function : Independent/Modified Independent;Driving             Mobility Comments: No falls. No AD ADLs Comments: Independent with ADLs        OT Problem List: Decreased strength;Impaired balance (sitting and/or standing);Decreased cognition;Impaired tone;Pain;Decreased range of motion;Impaired vision/perception;Decreased  safety awareness;Decreased activity tolerance;Decreased coordination;Decreased knowledge of use of DME or AE;Impaired sensation;Impaired UE functional use      OT Treatment/Interventions: Self-care/ADL training;Patient/family education;Therapeutic exercise;Balance training;Splinting;Neuromuscular education;Therapeutic activities;DME and/or AE instruction;Cognitive remediation/compensation;Manual therapy;Visual/perceptual remediation/compensation    OT Goals(Current goals can be found in the care plan section) Acute Rehab OT Goals Patient Stated Goal: Pt did not statey but spouse is supportive to help and get patient better. OT Goal Formulation: With family Time For Goal Achievement: 04/13/22 Potential to Achieve Goals: Good  OT Frequency: Min 3X/week       AM-PAC OT "6 Clicks" Daily Activity     Outcome Measure Help from another person eating meals?: A Lot Help from another person taking care of personal grooming?: A Lot Help from another person toileting, which includes using toliet, bedpan, or urinal?: Total Help from another person bathing (including washing, rinsing, drying)?: Total Help from another person to put on and taking off regular upper body clothing?: A Lot Help from another person to put on and taking off regular lower body clothing?: Total 6 Click Score: 9   End of Session Nurse Communication: Other (comment) (pt's returned back to bed and transport in to take pt to study)  Activity Tolerance: Patient tolerated treatment well Patient left: in bed;with call bell/phone within reach (transport in to take for swallow study)  OT Visit Diagnosis: Unsteadiness on feet (R26.81);Other abnormalities of gait and mobility (R26.89);Muscle weakness (generalized) (M62.81);Cognitive communication deficit (R41.841);Other symptoms and signs involving cognitive function;Hemiplegia and hemiparesis Symptoms and signs involving cognitive functions:  Cerebral infarction Hemiplegia - Right/Left:  Right Hemiplegia - dominant/non-dominant: Dominant Hemiplegia - caused by: Cerebral infarction                Time: 9563-8756 OT Time Calculation (min): 29 min Charges:  OT General Charges $OT Visit: 1 Visit OT Evaluation $OT Eval Low Complexity: 1 Low OT Treatments $Self Care/Home Management : 8-22 mins  Dorance Spink OTR/L 03/30/2022, 1:45 PM

## 2022-03-30 NOTE — Progress Notes (Signed)
Dr. Erlinda Hong instructed to keep SBP<160

## 2022-03-30 NOTE — Progress Notes (Signed)
Clarified SBP goal<180 per Dr. Tacy Learn

## 2022-03-30 NOTE — Evaluation (Signed)
Clinical/Bedside Swallow Evaluation Patient Details  Name: Elaine Ford MRN: [id number] Date of Birth: 19-Jul-1945  Today's Date: 03/30/2022 Time: SLP Start Time (ACUTE ONLY): 0850 SLP Stop Time (ACUTE ONLY): 0910 SLP Time Calculation (min) (ACUTE ONLY): 20 min  Past Medical History:  Past Medical History:  Diagnosis Date   Back pain    Broken arm    age 76 years old broke right arm after a fall   Gall bladder stones    Gestational diabetes    Hemorrhoid    Hx of cardiac cath    [facility] 07/24/14 at [facility] - no significant CAD   Hx of cardiovascular stress test    Myoview 11/14:  No ischemia   Hx of echocardiogram    Echo 07/23/14 - at [facility]:  EF 65-70%, impaired relaxation, mild LAE, mild MR, mild AI, mild elevated PASP (44.2 mmHg)   Hyperlipidemia    Hypertension    LBBB (left bundle branch block)    Low blood pressure    Nerve damage    right arm   Pain    Left upper chest s/p pacer maker insertion site 07/2014   S/P cardiac pacemaker procedure 07/2014   [employer] - Dr. Lovena Le   Stroke [facility])    patient was unaware noted prior to pacermaker placement unknown when it occurred   Past Surgical History:  Past Surgical History:  Procedure Laterality Date   COLONOSCOPY     DILATATION & CURETTAGE/HYSTEROSCOPY WITH MYOSURE N/A 02/29/2016   Procedure: DILATATION & CURETTAGE/HYSTEROSCOPY WITH MYOSURE;  Surgeon: [name], DO;  Location: [facility];  Service: Gynecology;  Laterality: N/A;  patient has a pacemaker    GALLBLADDER SURGERY     IR 3D INDEPENDENT WKST  03/28/2022   IR PERCUTANEOUS ART THROMBECTOMY/INFUSION INTRACRANIAL INC DIAG ANGIO  03/28/2022   IR US GUIDE VASC ACCESS RIGHT  03/28/2022   LAPAROSCOPIC BILATERAL SALPINGO OOPHERECTOMY  04/20/2016   Procedure: LAPAROSCOPIC BILATERAL SALPINGO OOPHORECTOMY;  Surgeon: [name], DO;  Location: [place];  Service: Gynecology;;   LEAD REVISION N/A 07/25/2014   Procedure: LEAD REVISION;  Surgeon: Deboraha Sprang, MD;  Location: [facility] CATH LAB;   Service: Cardiovascular;  Laterality: N/A;   PERMANENT PACEMAKER INSERTION N/A 07/24/2014   Procedure: PERMANENT PACEMAKER INSERTION;  Surgeon: [name], MD;  Location: [facility] CATH LAB;  Service: Cardiovascular;  Laterality: N/A;   RADIOLOGY WITH ANESTHESIA N/A 03/28/2022   Procedure: IR WITH ANESTHESIA;  Surgeon: Radiologist, Medication, MD;  Location: [place];  Service: Radiology;  Laterality: N/A;   VAGINAL HYSTERECTOMY  04/20/2016   Procedure: HYSTERECTOMY VAGINAL;  Surgeon: [name], DO;  Location: [facility];  Service: Gynecology;;   HPI:  9/18 admit for CVA.  CT of the head demonstrated hypodensity in the left MCA without hemorrhage.   Systemic TNK mechanical thrombectomy.  Post procedurally on vent to ICU. Postoperative CT did demonstrate small subarachnoid hemorrhage. No MRI at this time.    Assessment / Plan / Recommendation  Clinical Impression  Pt demonstrates mild right CN VII weakness and otherwise pt cannot lateralize tongue. Tongue and face are swollen. Concern that airway may also be swollen with additional post extubation changes. Pt able to manipulate ice, swallow sips of water with slight throat clear. Given multiple signs of compromised swallow mechanism, will proceed with instrumental swallow assessment. SLP Visit Diagnosis: Dysphagia, unspecified (R13.10)    Aspiration Risk  Severe aspiration risk    Diet Recommendation NPO  Other  Recommendations      Recommendations for follow up therapy are one component of a multi-disciplinary discharge planning process, led by the attending physician.  Recommendations may be updated based on patient status, additional functional criteria and insurance authorization.  Follow up Recommendations Acute inpatient rehab (3hours/day)      Assistance Recommended at Discharge Frequent or constant Supervision/Assistance  Functional Status Assessment Patient has had a recent decline in their functional status and demonstrates  the ability to make significant improvements in function in a reasonable and predictable amount of time.  Frequency and Duration min 2x/week  2 weeks       Prognosis        Swallow Study   General HPI: 9/18 admit for CVA.  CT of the head demonstrated hypodensity in the left MCA without hemorrhage.   Systemic TNK mechanical thrombectomy.  Post procedurally on vent to ICU. Postoperative CT did demonstrate small subarachnoid hemorrhage. No MRI at this time. Type of Study: Bedside Swallow Evaluation Diet Prior to this Study: NPO Temperature Spikes Noted: No History of Recent Intubation: No Behavior/Cognition: Alert;Requires cueing Oral Cavity Assessment: Edema Oral Care Completed by SLP: No Oral Cavity - Dentition: Adequate natural dentition Vision: Functional for self-feeding Self-Feeding Abilities: Total assist Patient Positioning: Upright in bed Baseline Vocal Quality: Low vocal intensity;Hoarse    Oral/Motor/Sensory Function Overall Oral Motor/Sensory Function: Moderate impairment Facial ROM: Reduced right;Suspected CN VII (facial) dysfunction Facial Symmetry: Abnormal symmetry right;Suspected CN VII (facial) dysfunction Facial Strength: Reduced right;Suspected CN VII (facial) dysfunction Lingual ROM: Other (Comment) (no effort)   Ice Chips Ice chips: Impaired Oral Phase Impairments: Reduced lingual movement/coordination Oral Phase Functional Implications: Oral holding   Thin Liquid Thin Liquid: Impaired Presentation: Cup Pharyngeal  Phase Impairments: Throat Clearing - Immediate    Nectar Thick Nectar Thick Liquid: Not tested   Honey Thick Honey Thick Liquid: Not tested   Puree Puree: Not tested   Solid     Solid: Not tested      Xayla Puzio, Katherene Ponto 03/30/2022,10:39 AM

## 2022-03-30 NOTE — Evaluation (Signed)
Physical Therapy Evaluation Patient Details Name: Elaine Ford MRN: [id number] DOB: [birth year] Today's Date: 03/30/2022  History of Present Illness  Pt is a 76 y.o. female who presented 03/28/22 with R-sided weakness and aphasia. Pt administered TNK. S/p diagnostic cerebral angiogram and mechanical thrombectomy, complicated by small [facility] 9/18. ETT 9/18-9/19. Noted to have left MCA infarct due to left M1 occlusion. PMHx of gallstones, gestational diabetes, HLD, HTN LBBB s/p pacemaker placement and hypotension, CVA   Clinical Impression  Pt presents with condition above and deficits mentioned below, see PT Problem List. PTA, she was IND without DME, living with her husband in a 2-level house with 1 STE. At baseline, pt can speak and understand English, but currently is responding better to Spanish cues provided by her husband at bedside. Currently, pt is requiring modA for bed mobility and modAx2 for transfers to stand and take a few steps bed > recliner with HHA. She displays deficits in R-sided strength and sensation, communication, cognition, balance, coordination, and activity tolerance along with a R lateral bias. She is at high risk for falls. Due to her drastic decline in functional status and good motivation to participate and improve, recommending intensive therapy in the AIR setting. Will continue to follow acutely.     Recommendations for follow up therapy are one component of a multi-disciplinary discharge planning process, led by the attending physician.  Recommendations may be updated based on patient status, additional functional criteria and insurance authorization.  Follow Up Recommendations Acute inpatient rehab (3hours/day)      Assistance Recommended at Discharge Frequent or constant Supervision/Assistance  Patient can return home with the following  Two people to help with walking and/or transfers;A lot of help with bathing/dressing/bathroom;Assistance with  cooking/housework;Direct supervision/assist for medications management;Direct supervision/assist for financial management;Assist for transportation;Help with stairs or ramp for entrance    Equipment Recommendations Other (comment) (defer to next venue of care)  Recommendations for Other Services  Rehab consult    Functional Status Assessment Patient has had a recent decline in their functional status and demonstrates the ability to make significant improvements in function in a reasonable and predictable amount of time.     Precautions / Restrictions Precautions Precautions: Fall;Other (comment) Precaution Comments: SBP < 160 Restrictions Weight Bearing Restrictions: No      Mobility  Bed Mobility Overal bed mobility: Needs Assistance Bed Mobility: Supine to Sit     Supine to sit: Mod assist, HOB elevated     General bed mobility comments: ModA to bring R leg off EOB, pivot and scoot hips, and complete trunk ascension with HOB elevated. Pt pulling with her L UE on therapist to sit up and scoot.    Transfers Overall transfer level: Needs assistance Equipment used: 2 person hand held assist Transfers: Sit to/from Stand, Bed to chair/wheelchair/BSC Sit to Stand: Mod assist, +2 physical assistance, +2 safety/equipment   Step pivot transfers: Mod assist, +2 physical assistance, +2 safety/equipment       General transfer comment: ModAx2 to power up and steady coming to stand from EOB as pt is of short stature and had difficulty reaching feet to floor sitting EOB. ModAx2 to steady and cue weight shifting and advancement of R foot to step to R bed > recliner, no knee buckling noted    Ambulation/Gait Ambulation/Gait assistance: Mod assist, +2 physical assistance, +2 safety/equipment Gait Distance (Feet): 2 Feet Assistive device: 2 person hand held assist Gait Pattern/deviations: Step-through pattern, Decreased step length - right, Decreased stride length, Decreased  dorsiflexion  - right, Decreased weight shift to right, Decreased weight shift to left Gait velocity: reduced Gait velocity interpretation: <1.31 ft/sec, indicative of household ambulator   General Gait Details: ModAx2 to steady and cue weight shifting and advancement of R foot to step to R bed > recliner, no knee buckling noted  Stairs            Wheelchair Mobility    Modified Rankin (Stroke Patients Only) Modified Rankin (Stroke Patients Only) Pre-Morbid Rankin Score: No symptoms Modified Rankin: Moderately severe disability     Balance Overall balance assessment: Needs assistance Sitting-balance support: Single extremity supported, Feet supported Sitting balance-Leahy Scale: Poor Sitting balance - Comments: Reliant on up to min guard-minA due to R lateral lean, cuing to pick up head and midline alignment Postural control: Right lateral lean Standing balance support: Bilateral upper extremity supported, During functional activity Standing balance-Leahy Scale: Poor Standing balance comment: Reliant on UE support and modAx2                             Pertinent Vitals/Pain Pain Assessment Pain Assessment: Faces Faces Pain Scale: No hurt Pain Intervention(s): Monitored during session    Home Living Family/patient expects to be discharged to:: Private residence Living Arrangements: Spouse/significant other Available Help at Discharge: Family;Available 24 hours/day Type of Home: House Home Access: Stairs to enter Entrance Stairs-Rails: None Entrance Stairs-Number of Steps: 1 Alternate Level Stairs-Number of Steps: 13 Home Layout: Two level;1/2 bath on main level;Bed/bath upstairs;Able to live on main level with bedroom/bathroom Home Equipment: Grab bars - toilet;Grab bars - tub/shower;Cane - single point;Shower seat      Prior Function Prior Level of Function : Independent/Modified Independent;Driving             Mobility Comments: No falls. No AD        Hand Dominance        Extremity/Trunk Assessment   Upper Extremity Assessment Upper Extremity Assessment: Defer to OT evaluation    Lower Extremity Assessment Lower Extremity Assessment: RLE deficits/detail RLE Deficits / Details: Noted weakness grossly throughout, difficult to formally assess due to cog/communication deficits, but no noted buckling with weight bearing, difficulty flexing hip to advance foot though; noted decreased sensation through observing lack or minimal withdrawal to noxious stimuli RLE Sensation: decreased light touch RLE Coordination: decreased gross motor    Cervical / Trunk Assessment Cervical / Trunk Assessment: Normal  Communication   Communication: Expressive difficulties  Cognition Arousal/Alertness: Awake/alert Behavior During Therapy: Flat affect Overall Cognitive Status: Difficult to assess                                 General Comments: Pt nonverbal throughout session. Husband reports pt can understand and speak English well at baseline, but appeared to understand cues in Spanish provided by husband better than in [place] during this session. Slow to process and initiate, needing cues for sequencing. Some R inattention, but able to turn head to track to R with cues.        General Comments General comments (skin integrity, edema, etc.): SBP 170s start, decreased to 160s with standing, RN aware and providing BP meds    Exercises     Assessment/Plan    PT Assessment Patient needs continued PT services  PT Problem List Decreased strength;Decreased balance;Decreased activity tolerance;Decreased mobility;Decreased coordination;Decreased cognition;Decreased knowledge of use of DME;Cardiopulmonary status limiting  activity;Impaired sensation       PT Treatment Interventions DME instruction;Gait training;Functional mobility training;Stair training;Therapeutic activities;Therapeutic exercise;Balance training;Neuromuscular  re-education;Cognitive remediation;Patient/family education    PT Goals (Current goals can be found in the Care Plan section)  Acute Rehab PT Goals Patient Stated Goal: did not state, husband agreeable to AIR PT Goal Formulation: With patient/family Time For Goal Achievement: 04/13/22 Potential to Achieve Goals: Good    Frequency Min 4X/week     Co-evaluation               AM-PAC PT "6 Clicks" Mobility  Outcome Measure Help needed turning from your back to your side while in a flat bed without using bedrails?: A Little Help needed moving from lying on your back to sitting on the side of a flat bed without using bedrails?: A Lot Help needed moving to and from a bed to a chair (including a wheelchair)?: Total Help needed standing up from a chair using your arms (e.g., wheelchair or bedside chair)?: Total Help needed to walk in hospital room?: Total Help needed climbing 3-5 steps with a railing? : Total 6 Click Score: 9    End of Session Equipment Utilized During Treatment: Gait belt Activity Tolerance: Patient tolerated treatment well Patient left: in chair;with call bell/phone within reach;with chair alarm set;with family/visitor present;with nursing/sitter in room Nurse Communication: Mobility status;Other (comment) (BP) PT Visit Diagnosis: Unsteadiness on feet (R26.81);Other abnormalities of gait and mobility (R26.89);Muscle weakness (generalized) (M62.81);Difficulty in walking, not elsewhere classified (R26.2);Other symptoms and signs involving the nervous system (R29.898);Hemiplegia and hemiparesis Hemiplegia - Right/Left: Right Hemiplegia - caused by: Cerebral infarction    Time: 0925-0959 PT Time Calculation (min) (ACUTE ONLY): 34 min   Charges:   PT Evaluation $PT Eval Moderate Complexity: 1 Mod PT Treatments $Therapeutic Activity: 8-22 mins        Moishe Spice, PT, DPT Acute Rehabilitation Services  Office: [phone]   Orvan Falconer 03/30/2022,  10:26 AM

## 2022-03-30 NOTE — Progress Notes (Signed)
  Inpatient Rehabilitation Admissions Coordinator   Met with patient and her nurse at bedside for rehab assessment. I then met in [place] with spouse, daughter and son briefly to introduce myself. I will follow  her progress to assist with planning most appropriate rehab venue when appropriate. Please call me with any questions.   Danne Baxter, RN, MSN Rehab Admissions Coordinator [phone]

## 2022-03-31 ENCOUNTER — Inpatient Hospital Stay (HOSPITAL_COMMUNITY): Payer: Medicare HMO

## 2022-03-31 DIAGNOSIS — I609 Nontraumatic subarachnoid hemorrhage, unspecified: Secondary | ICD-10-CM | POA: Diagnosis not present

## 2022-03-31 DIAGNOSIS — I484 Atypical atrial flutter: Secondary | ICD-10-CM | POA: Diagnosis not present

## 2022-03-31 DIAGNOSIS — I63412 Cerebral infarction due to embolism of left middle cerebral artery: Secondary | ICD-10-CM | POA: Diagnosis not present

## 2022-03-31 DIAGNOSIS — E876 Hypokalemia: Secondary | ICD-10-CM | POA: Diagnosis not present

## 2022-03-31 DIAGNOSIS — I63512 Cerebral infarction due to unspecified occlusion or stenosis of left middle cerebral artery: Secondary | ICD-10-CM | POA: Diagnosis not present

## 2022-03-31 LAB — BASIC METABOLIC PANEL
Anion gap: 8 (ref 5–15)
BUN: 9 mg/dL (ref 8–23)
CO2: 27 mmol/L (ref 22–32)
Calcium: 8.7 mg/dL — ABNORMAL LOW (ref 8.9–10.3)
Chloride: 104 mmol/L (ref 98–111)
Creatinine, Ser: 0.7 mg/dL (ref 0.44–1.00)
GFR, Estimated: >60 mL/min (ref >60)
Glucose, Bld: 97 mg/dL (ref 70–99)
Potassium: 3.3 mmol/L — ABNORMAL LOW (ref 3.5–5.1)
Sodium: 139 mmol/L (ref 135–145)

## 2022-03-31 LAB — CBC
HCT: 40.2 % (ref 36.0–46.0)
Hemoglobin: 12.8 g/dL (ref 12.0–15.0)
MCH: 27.5 pg (ref 26.0–34.0)
MCHC: 31.8 g/dL (ref 30.0–36.0)
MCV: 86.5 fL (ref 80.0–100.0)
Platelets: 202 10*3/uL (ref 150–400)
RBC: 4.65 MIL/uL (ref 3.87–5.11)
RDW: 14.8 % (ref 11.5–15.5)
WBC: 9.4 10*3/uL (ref 4.0–10.5)
nRBC: 0 % (ref 0.0–0.2)

## 2022-03-31 LAB — PHOSPHORUS: Phosphorus: 2.8 mg/dL (ref 2.5–4.6)

## 2022-03-31 LAB — MAGNESIUM: Magnesium: 2.2 mg/dL (ref 1.7–2.4)

## 2022-03-31 MED ORDER — ASPIRIN 300 MG RE SUPP: 150.0000 mg | Freq: Every day | RECTAL | Status: DC

## 2022-03-31 MED ORDER — POTASSIUM CHLORIDE 10 MEQ/100ML IV SOLN
10.0000 meq | INTRAVENOUS | Status: AC
  Administered 2022-03-31 (×6): 10 meq via INTRAVENOUS
  Filled 2022-03-31 (×6): qty 100

## 2022-03-31 MED ORDER — VITAL HIGH PROTEIN PO LIQD: 1000.0000 mL | ORAL | Status: DC

## 2022-03-31 MED ORDER — PANTOPRAZOLE 2 MG/ML SUSPENSION
40.0000 mg | Freq: Every day | ORAL | Status: DC
  Administered 2022-03-31 – 2022-04-05 (×6): 40 mg
  Filled 2022-03-31 (×6): qty 20

## 2022-03-31 MED ORDER — ASPIRIN 81 MG PO CHEW
81.0000 mg | CHEWABLE_TABLET | Freq: Every day | ORAL | Status: DC
  Administered 2022-04-01 – 2022-04-06 (×6): 81 mg
  Filled 2022-03-31 (×6): qty 1

## 2022-03-31 MED ORDER — CARVEDILOL 25 MG PO TABS
25.0000 mg | ORAL_TABLET | Freq: Two times a day (BID) | ORAL | Status: DC
  Administered 2022-03-31 – 2022-04-06 (×12): 25 mg
  Filled 2022-03-31 (×7): qty 1
  Filled 2022-03-31: qty 2
  Filled 2022-03-31 (×2): qty 1
  Filled 2022-03-31: qty 2
  Filled 2022-03-31: qty 1

## 2022-03-31 MED ORDER — ATORVASTATIN CALCIUM 40 MG PO TABS
40.0000 mg | ORAL_TABLET | Freq: Every day | ORAL | Status: DC
  Administered 2022-04-01 – 2022-04-06 (×6): 40 mg
  Filled 2022-03-31 (×6): qty 1

## 2022-03-31 MED ORDER — PROSOURCE TF20 ENFIT COMPATIBL EN LIQD
60.0000 mL | Freq: Every day | ENTERAL | Status: DC
  Administered 2022-03-31 – 2022-04-06 (×7): 60 mL
  Filled 2022-03-31 (×7): qty 60

## 2022-03-31 MED ORDER — HYDRALAZINE HCL 20 MG/ML IJ SOLN
10.0000 mg | INTRAMUSCULAR | Status: DC | PRN
  Administered 2022-03-31 – 2022-04-04 (×4): 10 mg via INTRAVENOUS
  Filled 2022-03-31 (×5): qty 1

## 2022-03-31 MED ORDER — OSMOLITE 1.5 CAL PO LIQD
1000.0000 mL | ORAL | Status: DC
  Administered 2022-03-31 – 2022-04-06 (×4): 1000 mL
  Filled 2022-03-31 (×5): qty 1000

## 2022-03-31 NOTE — Progress Notes (Addendum)
STROKE TEAM PROGRESS NOTE   INTERVAL HISTORY Patient is seen in her room with her husband at the bedside.  She has been hemodynamically stable overnight.  She is more alert today and has been able to speak to family members.  Vitals:   03/31/22 1200 03/31/22 1300 03/31/22 1400 03/31/22 1500  BP: (!) 125/58 (!) 122/55 124/61 134/60  Pulse: 66 68 67 62  Resp: '12 13 12 12  '$ Temp:      TempSrc:      SpO2: 99% 96% 99% 98%  Weight:      Height:       CBC:  Recent Labs  Lab 03/28/22 1300 03/28/22 1719 03/30/22 0255 03/31/22 0159  WBC 7.3  --  11.5* 9.4  NEUTROABS 5.4  --   --   --   HGB 15.1*  15.6*   < > 12.3 12.8  HCT 47.0*  46.0   < > 37.6 40.2  MCV 86.6  --  85.1 86.5  PLT 298  --  217 202   < > = values in this interval not displayed.    Basic Metabolic Panel:  Recent Labs  Lab 03/30/22 0255 03/31/22 0159  NA 139 139  K 3.9 3.3*  CL 108 104  CO2 23 27  GLUCOSE 106* 97  BUN 10 9  CREATININE 0.95 0.70  CALCIUM 8.3* 8.7*    Lipid Panel:  Recent Labs  Lab 03/29/22 0839  CHOL 213*  TRIG 364*  HDL 36*  CHOLHDL 5.9  VLDL 73*  LDLCALC 104*    HgbA1c:  Recent Labs  Lab 03/28/22 1300  HGBA1C 5.5    Urine Drug Screen: No results for input(s): "LABOPIA", "COCAINSCRNUR", "LABBENZ", "AMPHETMU", "THCU", "LABBARB" in the last 168 hours.  Alcohol Level  Recent Labs  Lab 03/28/22 1300  ETH <10     IMAGING past 24 hours DG Abd Portable 1V  Result Date: 03/31/2022 CLINICAL DATA:  Feeding tube placement EXAM: PORTABLE ABDOMEN - 1 VIEW COMPARISON:  Abdomen 03/28/2022 FINDINGS: Feeding tube enters the stomach with the tip in the gastric antrum. Normal bowel gas pattern. Pacemaker noted. IMPRESSION: Feeding tube tip in the gastric antrum.  Normal bowel gas pattern. Electronically Signed   By: Franchot Gallo M.D.   On: 03/31/2022 15:04    PHYSICAL EXAM  Physical Exam  Constitutional: Appears well-developed and well-nourished.   Cardiovascular: Normal rate and  regular rhythm.  Respiratory: Effort normal, non-labored breathing   NEURO:  Mental Status: difficult to assess due to aphasia Speech/Language: speech dysarthric with both receptive and expressive aphasia.  Can intermittently follow commands and repeat short phrases  Cranial Nerves:  II: PERRL. Does not blink to threat on the right III, IV, VI: left gaze deviation, cannot cross midline  VII: slight right facial droop   VIII: hearing intact to voice. XII: tongue is midline without fasciculations. Motor: 5/5 strength to LUE and LLE, 0/5 to RUE and 2/5 to RLE  Sensation- Appears intact to light touch bilaterally.  Gait- deferred   ASSESSMENT/PLAN Ms. ANNALIA METZGER is a 76 y.o. female with history of gallstones, HLD, HTN LBBB s/p pacemaker placement and hypotension, presenting to the ED via EMS with acute onset of right hemiplegia and aphasia presenting with via EMS after being found slumped towards the right on the couch by her husband. She received TNK and underwent a mechanical thrombectomy for a proximal left M1 occlusion. Repeat CT Head showed extensive stroke in the left MCA territory with cytotoxic  edema and mild midline shift.  Stroke:  Left MCA infarct due to left M1 occlusion s/p TNK and IR with TICI3 complicated by small SAH, etiology unclear  Code Stroke CT head Hyperdensity in the left MCA near the bifurcation and in the proximal M2 branches CTA head & neck Occlusion of the left distal M1 with minimal flow in the proximal left M2 branches and poor perfusion of the remainder of the left MCA territory. Multifocal stenosis in the bilateral PCAs, which is severe at the origin of one of the left P3 branches, moderate in the proximal right P1 and throughout the P2, and mild in the left P2. Poor perfusion of the right P3 segments. IR with TICI3 after 4 passes Post IR CT Small SAH and contrast pending x 2 Not able to have MRI due to pacer incompatible CT repeat 9/19 Increased edema in  left MCA territory with no midline shift, likely small SAH in left sylvian fissure and left cerebral sulci 2D Echo 60-65%- normal LV function Pacemaker interrogation showed only 10 seconds of a flutter  LDL 104 HgbA1c 5.5 VTE prophylaxis - SCDs No antithrombotic prior to admission, now on aspirin 81 mg daily. If CT stable in am, consider DAPT Therapy recommendations: CIR Disposition:  pending  Respiratory failure Intubated before procedure Remain intubated after procedure due to mental status Extubated 9/19 after CT repeat Mild respiratory distress with secretions, no gag or cough High risk for reintubation Close ICU monitoring  Paroxysmal brief Aflutter Pacemaker interrogation showed a 10 seconds of Aflutter Discussed with EP, does not think enough evidence for anticoagulation Aspirin 81 mg daily Continue pacemaker monitoring  Hypertension Home meds:  Coreg '25mg'$  Stable BP goal < 160 Long-term BP goal normotensive  Hyperlipidemia LDL 104, goal < 70 Resume atorvastatin '40mg'$   Continue statin at discharge  Dysphagia Did not pass a swallow On IV fluid Placed core track, on tube feeding @ 45 DC IV fluid Speech on board  Other Stroke Risk Factors Advanced Age >/= 20  Obesity, Body mass index is 33.6 kg/m., BMI >/= 30 associated with increased stroke risk, recommend weight loss, diet and exercise as appropriate   Other Active Problems LBBB on pacer  Hospital day # 3  Patient seen and examined by NP/APP with MD. MD to update note as needed.   [place] , MSN, AGACNP-BC [facility] See Amion for schedule and pager information 03/31/2022 3:41 PM  ATTENDING NOTE: I reviewed above note and agree with the assessment and plan. Pt was seen and examined.   Husband and daughter at bedside.  Patient lying in bed, more awake alert, eyes open spontaneously.  Intermittent follows simple central and peripheral commands on the left hand and foot.  Still  has right upper extremity plegia and right lower extremity paresis, but right lower extremity seems improved from yesterday.  Still did not pass swallow, put on core track for nutrition.  Continue aspirin for now.  Repeat CT in a.m., if [facility] improves, will start DAPT.  On Lipitor 40, started tube feeding.  PT/OT recommend CIR.  For detailed assessment and plan, please refer to above/below as I have made changes wherever appropriate.   Rosalin Hawking, MD PhD Stroke Neurology 03/31/2022 11:16 PM  This patient is critically ill due to stroke, status post TNK and mechanical thrombectomy, SAH, respiratory failure and at significant risk of neurological worsening, death form recurrent stroke, hemorrhagic transformation, bleeding from TNK, worsening SAH, heart failure, respiratory failure. This patient's  care requires constant monitoring of vital signs, hemodynamics, respiratory and cardiac monitoring, review of multiple databases, neurological assessment, discussion with family, other specialists and medical decision making of high complexity. I spent 40 minutes of neurocritical care time in the care of this patient. I had long discussion with husband at bedside, updated pt current condition, treatment plan and potential prognosis, and answered all the questions.  He expressed understanding and appreciation.  I also discussed with CCM Dr. Tacy Learn.     To contact Stroke Continuity provider, please refer to [email or web address] After hours, contact General Neurology

## 2022-03-31 NOTE — Progress Notes (Signed)
[facility] ADULT ICU REPLACEMENT PROTOCOL   The patient does apply for the [facility] Adult ICU Electrolyte Replacment Protocol based on the criteria listed below:   1.Exclusion criteria: TCTS patients, ECMO patients, and Dialysis patients 2. Is GFR >/= 30 ml/min? Yes.    Patient's GFR today is >60 3. Is SCr </= 2? Yes.   Patient's SCr is 3.3 mg/dL 4. Did SCr increase >/= 0.5 in 24 hours? No. 5.Pt's weight >40kg  Yes.   6. Abnormal electrolyte(s):   K 3.3  7. Electrolytes replaced per protocol 8.  Call MD STAT for K+ </= 2.5, Phos </= 1, or Mag </= 1 Physician:  Lowella Bandy R Aryeh Butterfield 03/31/2022 3:31 AM

## 2022-03-31 NOTE — Progress Notes (Signed)
PCCM INTERVAL PROGRESS NOTE   MBS failed Will request Cortrak Still requiring clevidipine intermittently  Restart home carvedilol once cortrak placed.    Georgann Housekeeper, AGACNP-BC  Pulmonary & Critical Care  See Amion for personal pager PCCM on call pager [phone] until 7pm. Please call Elink 7p-7a. [phone]  03/31/2022 12:09 PM

## 2022-03-31 NOTE — Telephone Encounter (Signed)
Noted  

## 2022-03-31 NOTE — Progress Notes (Signed)
03/30/22 1400  SLP Visit Information  SLP Received On 03/30/22  Pain Assessment  Facial Expression 0  Body Movements 0  Muscle Tension 0  Compliance with ventilator (intubated pts.) N/A  Vocalization (extubated pts.) 0  CPOT Total 0  General Information  HPI 9/18 admit for CVA.  CT of the head demonstrated hypodensity in the left MCA without hemorrhage.   Systemic TNK mechanical thrombectomy.  Post procedurally on vent to ICU. Postoperative CT did demonstrate small subarachnoid hemorrhage.Extubated 9/19. No MRI at this time.  Type of Study MBS-Modified Barium Swallow Study  Diet Prior to this Study NPO  Temperature Spikes Noted No  Respiratory Status Nasal cannula  History of Recent Intubation Yes  Length of Intubations (days) 2 days  Date extubated 03/29/22  Behavior/Cognition Alert;Cooperative;Pleasant mood;Requires cueing  Oral Care Completed by SLP No  Oral Cavity - Dentition Adequate natural dentition  Self-Feeding Abilities Total assist  Patient Positioning Upright in chair  Volitional Cough Weak  Volitional Swallow Able to elicit  Oral Motor/Sensory Function  Overall Oral Motor/Sensory Function Moderate impairment  Facial ROM Reduced right;Suspected CN VII (facial) dysfunction  Facial Symmetry Abnormal symmetry right;Suspected CN VII (facial) dysfunction  Facial Strength Reduced right;Suspected CN VII (facial) dysfunction  Lingual ROM Other (Comment)  Oral Preparation/Oral Phase  Oral Phase Impaired  Oral - Honey  Oral - Honey Teaspoon Reduced posterior propulsion;Weak lingual manipulation;Delayed oral transit  Oral - Nectar  Oral - Nectar Teaspoon Reduced posterior propulsion;Weak lingual manipulation;Delayed oral transit  Oral - Solids  Oral - Puree Reduced posterior propulsion;Weak lingual manipulation;Delayed oral transit  Pharyngeal Phase  Pharyngeal Phase Impaired  Pharyngeal - Honey  Pharyngeal- Honey Teaspoon Penetration/Apiration after swallow;Delayed  swallow initiation-pyriform sinuses;Reduced epiglottic inversion;Reduced anterior laryngeal mobility;Reduced tongue base retraction;Moderate aspiration;Pharyngeal residue - valleculae  Pharyngeal Material enters airway, passes BELOW cords and not ejected out despite cough attempt by patient;Material enters airway, passes BELOW cords without attempt by patient to eject out (silent aspiration)  Pharyngeal - Nectar  Pharyngeal- Nectar Teaspoon Penetration/Apiration after swallow;Delayed swallow initiation-pyriform sinuses;Reduced epiglottic inversion;Reduced anterior laryngeal mobility;Reduced tongue base retraction;Moderate aspiration;Pharyngeal residue - valleculae  Pharyngeal Material enters airway, passes BELOW cords without attempt by patient to eject out (silent aspiration);Material enters airway, passes BELOW cords and not ejected out despite cough attempt by patient  Pharyngeal - Solids  Pharyngeal- Puree Delayed swallow initiation-pyriform sinuses;Reduced epiglottic inversion;Reduced anterior laryngeal mobility;Reduced tongue base retraction;Pharyngeal residue - valleculae  Clinical Impression  Clinical Impression Pt demonstrates a severe oropharyngeal dysphagia with delayed sensation of aspiration post swallow with inability to eject due to decreased cough drive. Pt has laborious oral transit with rocking behavior to achieve transit. Bolus spills to pyriform with nectar and honey teaspoon. There is an edematous appearance to epiglottis. Decreased base of tongue tension and epiglottic swelling as well as appearance of reduced hyoid excursion lead to decreased epiglottic deflection and airway protection with penetration during the swallow and aspiration of penetrate and vallecualr residue post swallow. Pt looks like she wants to cough, face gets red, pt grimacing, but cough not elicited to eject aspiration. Puree bolus not aspirated but given delay and residue risk is high. Pt needs decreased edema and  increased oropharyngeal control prior to diet initiation. Recommend NPO with Cortrak.  SLP Visit Diagnosis Dysphagia, oropharyngeal phase (R13.12)  Impact on safety and function Severe aspiration risk  Swallow Evaluation Recommendations  SLP Diet Recommendations NPO;Alternative means - temporary  Medication Administration Via alternative means  Treatment Plan  Treatment Recommendations Therapy  as outlined in treatment plan below  Follow Up Recommendations Acute inpatient rehab (3hours/day)  Assistance recommended at discharge Frequent or constant Supervision/Assistance  Functional Status Assessment Patient has had a recent decline in their functional status and demonstrates the ability to make significant improvements in function in a reasonable and predictable amount of time.  Speech Therapy Frequency (ACUTE ONLY) min 2x/week  Treatment Duration 2 weeks  Individuals Consulted  Consulted and Agree with Results and Recommendations Patient;RN  Progression Toward Goals  Potential to Achieve Goals (ACUTE ONLY) Good  SLP Time Calculation  SLP Start Time (ACUTE ONLY) 1200  SLP Stop Time (ACUTE ONLY) 1220  SLP Time Calculation (min) (ACUTE ONLY) 20 min  SLP Evaluations  $ SLP Speech Visit 1 Visit  SLP Evaluations  $MBS Swallow 1 Procedure  $Swallowing Treatment 1 Procedure

## 2022-03-31 NOTE — Procedures (Signed)
Cortrak  Person Inserting Tube:  Ranell Patrick D, RD Tube Type:  Cortrak - 43 inches Tube Size:  10 Tube Location:  Left nare Secured by: Bridle Technique Used to Measure Tube Placement:  Marking at nare/corner of mouth Cortrak Secured At:  67 cm  Cortrak Tube Team Note:  Consult received to place a Cortrak feeding tube.   X-ray is required, abdominal x-ray has been ordered by the Cortrak team. Please confirm tube placement before using the Cortrak tube.   If the tube becomes dislodged please keep the tube and contact the Cortrak team at [email or web address] ([email or web address]) for replacement.  If after hours and replacement cannot be delayed, place a NG tube and confirm placement with an abdominal x-ray.    Ranell Patrick, RD, LDN Clinical Dietitian RD pager # available in [place]  After hours/weekend pager # available in [facility]

## 2022-03-31 NOTE — Progress Notes (Signed)
Physical Therapy Treatment Patient Details Name: Elaine Ford MRN: [id number] DOB: 09-02-1945 Today's Date: 03/31/2022   History of Present Illness Pt is a 76 y.o. female who presented 03/28/22 with R-sided weakness and aphasia. Pt administered TNK. S/p diagnostic cerebral angiogram and mechanical thrombectomy, complicated by small [facility] 9/18. ETT 9/18-9/19. Noted to have left MCA infarct due to left M1 occlusion. PMHx of gallstones, gestational diabetes, HLD, HTN LBBB s/p pacemaker placement and hypotension, CVA    PT Comments    Pt is more verbal today, but perseverating on "el nombre" and "el nombre de Jesus" throughout the session. She continues to respond more consistently to cues provided in Spanish, using family members to translate, than she does in [place]. Pt with improved tracking and attention to R side, but demonstrates a strong R lateral lean, resulting in her requiring extensive assistance for sitting balance initially and for standing balance. Practiced lateral weight shifting in standing for pre-gait training. Will continue to follow acutely. Current recommendations remain appropriate.     Recommendations for follow up therapy are one component of a multi-disciplinary discharge planning process, led by the attending physician.  Recommendations may be updated based on patient status, additional functional criteria and insurance authorization.  Follow Up Recommendations  Acute inpatient rehab (3hours/day)     Assistance Recommended at Discharge Frequent or constant Supervision/Assistance  Patient can return home with the following Two people to help with walking and/or transfers;A lot of help with bathing/dressing/bathroom;Assistance with cooking/housework;Direct supervision/assist for medications management;Direct supervision/assist for financial management;Assist for transportation;Help with stairs or ramp for entrance   Equipment Recommendations  Other (comment) (defer to next  venue of care)    Recommendations for Other Services       Precautions / Restrictions Precautions Precautions: Fall;Other (comment) Precaution Comments: SBP < 160 Restrictions Weight Bearing Restrictions: No     Mobility  Bed Mobility Overal bed mobility: Needs Assistance Bed Mobility: Supine to Sit, Sit to Supine     Supine to sit: Mod assist, HOB elevated Sit to supine: Max assist, HOB elevated   General bed mobility comments: ModA to bring R leg off EOB, pivot and scoot hips, and complete trunk ascension with HOB elevated. Pt pulling with her L UE on therapist to sit up and scoot. MaxA to return to supine for safety due to pt sitting very close to EOB after standing.    Transfers Overall transfer level: Needs assistance Equipment used: 1 person hand held assist Transfers: Sit to/from Stand Sit to Stand: Max assist           General transfer comment: MaxA and R knee block to stand from EOB, noting strong R lateral lean needing cues to correct with mod success. When sitting from standing pt sat very close to EOB, thus needing to return to supine for pt safety    Ambulation/Gait             Pre-gait activities: MaxA for lateral weight shifting and cuing pt to lift contralateral foot while standing at EOB with UE support on therapist anterior to her. Pt with strong R lateral lean     Stairs             Wheelchair Mobility    Modified Rankin (Stroke Patients Only) Modified Rankin (Stroke Patients Only) Pre-Morbid Rankin Score: No symptoms Modified Rankin: Moderately severe disability     Balance Overall balance assessment: Needs assistance Sitting-balance support: Single extremity supported, Feet supported Sitting balance-Leahy Scale: Poor Sitting balance -  Comments: MaxA initially due to strong R lateral lean, but progressed to min guard-minA with cues for midline Postural control: Right lateral lean Standing balance support: Single extremity  supported Standing balance-Leahy Scale: Poor Standing balance comment: Mod-maxA for standing balance with noted strong R lateral lean                            Cognition Arousal/Alertness: Awake/alert Behavior During Therapy: Flat affect Overall Cognitive Status: Impaired/Different from baseline Area of Impairment: Attention, Following commands, Safety/judgement, Awareness, Problem solving                   Current Attention Level: Sustained   Following Commands: Follows one step commands inconsistently, Follows one step commands with increased time Safety/Judgement: Decreased awareness of safety, Decreased awareness of deficits Awareness: Intellectual Problem Solving: Slow processing, Decreased initiation, Difficulty sequencing, Requires verbal cues, Requires tactile cues General Comments: Pt perseverating on "el nombre" and "el nombre de Jesus" throughout session. Responds better to cues in Spanish, translation provided by daughter this session. Follows simple commands inconsistently with delayed processing. Poor awareness of her strong R lean and sitting close to edge of bed impacting her safety, thereby needing to return to supine to ensure no LOB off EOB.        Exercises General Exercises - Upper Extremity Shoulder Flexion: AAROM, Right, PROM, 10 reps, Seated (<90 degrees while trying to mobilize R scapula)    General Comments General comments (skin integrity, edema, etc.): SBP 130s at start of session, increased to 170s after placed bed in trendelenburg to scoot pt superiorly in bed, then reduces to 140s again resting end of session; daughter present and translating throughout session      Pertinent Vitals/Pain Pain Assessment Pain Assessment: Faces Faces Pain Scale: No hurt Pain Intervention(s): Monitored during session    Home Living                          Prior Function            PT Goals (current goals can now be found in the care  plan section) Acute Rehab PT Goals Patient Stated Goal: did not state, daughter desiring pt to improve PT Goal Formulation: With patient/family Time For Goal Achievement: 04/13/22 Potential to Achieve Goals: Good Progress towards PT goals: Progressing toward goals    Frequency    Min 4X/week      PT Plan Current plan remains appropriate    Co-evaluation              AM-PAC PT "6 Clicks" Mobility   Outcome Measure  Help needed turning from your back to your side while in a flat bed without using bedrails?: A Little Help needed moving from lying on your back to sitting on the side of a flat bed without using bedrails?: A Lot Help needed moving to and from a bed to a chair (including a wheelchair)?: Total Help needed standing up from a chair using your arms (e.g., wheelchair or bedside chair)?: A Lot Help needed to walk in hospital room?: Total Help needed climbing 3-5 steps with a railing? : Total 6 Click Score: 10    End of Session Equipment Utilized During Treatment: Gait belt Activity Tolerance: Patient tolerated treatment well Patient left: with family/visitor present;with nursing/sitter in room;in bed;with call bell/phone within reach;with bed alarm set Nurse Communication: Mobility status;Other (comment) (BP) PT Visit Diagnosis: Unsteadiness  on feet (R26.81);Other abnormalities of gait and mobility (R26.89);Muscle weakness (generalized) (M62.81);Difficulty in walking, not elsewhere classified (R26.2);Other symptoms and signs involving the nervous system (R29.898);Hemiplegia and hemiparesis Hemiplegia - Right/Left: Right Hemiplegia - caused by: Cerebral infarction     Time: 2956-2130 PT Time Calculation (min) (ACUTE ONLY): 25 min  Charges:  $Therapeutic Activity: 23-37 mins                     Moishe Spice, PT, DPT Acute Rehabilitation Services  Office: [phone]    Orvan Falconer 03/31/2022, 5:06 PM

## 2022-03-31 NOTE — Progress Notes (Signed)
Initial Nutrition Assessment  DOCUMENTATION CODES:   Not applicable  INTERVENTION: Initiate tube feeding via Cortrak (tip in distal stomach): Start Osmolite 1.5 at 59m and advance by 111mq6h until a goal rate of 4532mr (1080 ml per day) Prosource TF20 60 ml once daily  Provides 1700 kcal, 88 gm protein, 823 ml free water daily  NUTRITION DIAGNOSIS:   Inadequate oral intake related to acute illness as evidenced by NPO status.  GOAL:   Patient will meet greater than or equal to 90% of their needs  MONITOR:   Labs, TF tolerance  REASON FOR ASSESSMENT:   Consult Enteral/tube feeding initiation and management  ASSESSMENT:   Pt admitted with acute L MCA stroke s/p TNK and thrombectomy. PMH significant for diabetes, HTN, HLD.  Postoperative CT findings of small SAH.   S/p MBS, findings of severe oropharyngeal dysphagia. ST recommends NPO with Cortrak.   Pt resting at time of visit. Spoke with her husband and daughter at bedside. They report up until admission, she was eating well usually 3 meals per day. She had been trying to lose some weight and had been cutting back on portion sizes. She typically eats chicken, fruits and vegetables. She does not usually eat any meats other than chicken.   They did not quantify her usual weight but states that her weight had fluctuated up and down a couple pounds weekly. Unfortunately there is limited documentation of weight history on file throughout the last year. It appears she has not had a significant weight loss from 04/02/21 to current admission.   Answered all questions pt's family had regarding Cortrak placement and TF.    Medications: colace (not given), protonix IV drips: cleviprex  Labs: potassium 3.3 (repleting), HgbA1c 5.5%, CBG 149  UOP: 2.8L x 24hours + 500m50m2 hours I/O's: +267ml90mce admission  NUTRITION - FOCUSED PHYSICAL EXAM:  Flowsheet Row Most Recent Value  Orbital Region No depletion  Upper Arm Region  No depletion  Thoracic and Lumbar Region No depletion  Buccal Region No depletion  Temple Region No depletion  Clavicle Bone Region No depletion  Clavicle and Acromion Bone Region No depletion  Scapular Bone Region No depletion  Dorsal Hand No depletion  Patellar Region No depletion  Anterior Thigh Region No depletion  Posterior Calf Region No depletion  Edema (RD Assessment) None  Hair Reviewed  Eyes Reviewed  Mouth Reviewed  Skin Reviewed  Nails Reviewed       Diet Order:   Diet Order             Diet NPO time specified  Diet effective now                   EDUCATION NEEDS:   Education needs have been addressed  Skin:  Skin Assessment: Reviewed RN Assessment  Last BM:  PTA  Height:   Ht Readings from Last 1 Encounters:  03/28/22 '5\' 4"'$  (1.626 m)    Weight:   Wt Readings from Last 1 Encounters:  03/28/22 88.8 kg    Ideal Body Weight:  54.5 kg  BMI:  Body mass index is 33.6 kg/m.  Estimated Nutritional Needs:   Kcal:  1600-1800  Protein:  80-95g  Fluid:  >/=1.6L  AllieClayborne Dana, LDN Clinical Nutrition

## 2022-03-31 NOTE — Progress Notes (Signed)
NAME:  Elaine Ford, MRN:  [id number], DOB:  01-30-1946, LOS: 3 ADMISSION DATE:  03/28/2022, CONSULTATION DATE:  9/18 REFERRING MD:  Dr. Cheral Marker, CHIEF COMPLAINT:  CVA   History of Present Illness:  Patient is encephalopathic and/or intubated. Therefore history has been obtained from chart review.  76 year old female with past medical history as below, which is significant for diabetes, hypertension, hyperlipidemia, followed by block status post pacemaker, and hypotension.  She was in her usual state of health until 10 AM on 9/18.  She presented most emergency department that same day at approximately 1:00 PM with complaints of acute onset right hemiplegia and aphasia.  Her husband found her slumped over to the right on the couch at home.  Upon arrival to the ED she was confirmed to be aphasic with severe right-sided weakness.  Exam was concerning for left MCA territory infarct.  CT of the head demonstrated hypodensity in the left MCA without hemorrhage.  The patient was administered systemic TNK.  CT angiogram and perfusion deemed the patient a favorable candidate for mechanical thrombectomy.  She was taken to IR where she underwent mechanical thrombectomy of proximal left M1/MCA occlusion with a TICI 3 result.  Postoperative CT did demonstrate small subarachnoid hemorrhage.  She was transferred to the ICU on mechanical ventilator for recovery.   Pertinent  Medical History   has a past medical history of Back pain, Broken arm, Gall bladder stones, Gestational diabetes, Hemorrhoid, cardiac cath, cardiovascular stress test, echocardiogram, Hyperlipidemia, Hypertension, LBBB (left bundle branch block), Low blood pressure, Nerve damage, Pain, S/P cardiac pacemaker procedure (07/2014), and Stroke [facility]).   Significant Hospital Events: Including procedures, antibiotic start and stop dates in addition to other pertinent events   9/18 admit for CVA.  Systemic TNK mechanical thrombectomy.  Post procedurally on  vent to ICU. 9/19 extubated  Interim History / Subjective:  No acute events overnight.   Objective   Blood pressure 138/74, pulse 63, temperature 98.9 F (37.2 C), temperature source Axillary, resp. rate 14, height '5\' 4"'$  (1.626 m), weight 88.8 kg, SpO2 94 %.        Intake/Output Summary (Last 24 hours) at 03/31/2022 0839 Last data filed at 03/31/2022 0700 Gross per 24 hour  Intake 1495.18 ml  Output 2130 ml  Net -634.82 ml    Filed Weights   03/28/22 1200  Weight: 88.8 kg    Examination:  General: elderly female in NAD HEENT: [place]/AT, PERRL, no JVD. Mild facial edema.  Neuro: Spontaneously awake, alert, tracks with eyes. Able to wiggle bilateral toes to command. Does not follow in the uppers or tongue protrusion. Moves LUE spontaneously.  Chest: Clear Heart: RRR, no MRG Abdomen: Soft, NT, ND Skin: Grossly intact  Resolved Hospital Problem list   Acute respiratory insufficiency  Assessment & Plan:  Acute left MCA stroke s/p TNK and thrombectomy  Postprocedure subarachnoid hemorrhage with contrast extravasation  - Neuro checks - asa - VTE ppx on hold - Stroke service primary - Patient has pacemaker which is not compatible with MRI - Continue secondary stroke prophylaxis - Echocardiogram is pending - SLP eval pending MBS  Paroxysmal atrial flutter: brief noted on pacemaker interrogation. Stroke discussed with EP. No role for AC.  Complete heart block: s/p pacemaker: paced on monitor. EKG unchanged. - telemetry monitoring - Restart coreg when able  Hypertension - Keep SBP < 160 mmHg - Remains on low dose cleviprex.  - Will need to obtain enteral access and restart home coreg.  Nutrition -  MBS read pending, presumably she will require cortrak and TF.   Obesity - Dietitian follow-up  Best Practice (right click and "Reselect all SmartList Selections" daily)   Diet/type: NPO DVT prophylaxis: SCD GI prophylaxis: PPI Lines: N/A Foley:  N/A Code Status:  full  code Last date of multidisciplinary goals of care discussion [9/19: With patient's husband, decision was to continue full scope of care]  Labs   CBC: Recent Labs  Lab 03/28/22 1300 03/28/22 1719 03/30/22 0255 03/31/22 0159  WBC 7.3  --  11.5* 9.4  NEUTROABS 5.4  --   --   --   HGB 15.1*  15.6* 15.3* 12.3 12.8  HCT 47.0*  46.0 45.0 37.6 40.2  MCV 86.6  --  85.1 86.5  PLT 298  --  217 202     Basic Metabolic Panel: Recent Labs  Lab 03/28/22 1300 03/28/22 1719 03/30/22 0255 03/31/22 0159  NA 139  139 137 139 139  K 4.4  4.4 3.8 3.9 3.3*  CL 105  104  --  108 104  CO2 23  --  23 27  GLUCOSE 144*  144*  --  106* 97  BUN 12  13  --  10 9  CREATININE 0.86  0.70  --  0.95 0.70  CALCIUM 9.5  --  8.3* 8.7*    GFR: Estimated Creatinine Clearance: 64.5 mL/min (by C-G formula based on SCr of 0.7 mg/dL). Recent Labs  Lab 03/28/22 1300 03/30/22 0255 03/31/22 0159  WBC 7.3 11.5* 9.4     Liver Function Tests: Recent Labs  Lab 03/28/22 1300  AST 28  ALT 21  ALKPHOS 74  BILITOT 0.8  PROT 7.5  ALBUMIN 3.7    No results for input(s): "LIPASE", "AMYLASE" in the last 168 hours. No results for input(s): "AMMONIA" in the last 168 hours.  ABG    Component Value Date/Time   PHART 7.453 (H) 03/28/2022 1719   PCO2ART 34.4 03/28/2022 1719   PO2ART 134 (H) 03/28/2022 1719   HCO3 24.8 03/28/2022 1719   TCO2 26 03/28/2022 1719   O2SAT 99 03/28/2022 1719     Coagulation Profile: Recent Labs  Lab 03/28/22 1300  INR 1.0     Cardiac Enzymes: No results for input(s): "CKTOTAL", "CKMB", "CKMBINDEX", "TROPONINI" in the last 168 hours.  HbA1C: Hemoglobin A1C  Date/Time Value Ref Range Status  07/22/2014 05:34 PM 5.6 4.2 - 6.3 % Final    Comment:    The American Diabetes Association recommends that a primary goal of therapy should be <7% and that physicians should reevaluate the treatment regimen in patients with HbA1c values consistently >8%.    Hgb A1c  MFr Bld  Date/Time Value Ref Range Status  03/28/2022 01:00 PM 5.5 4.8 - 5.6 % Final    Comment:    (NOTE) Pre diabetes:          5.7%-6.4%  Diabetes:              >6.4%  Glycemic control for   <7.0% adults with diabetes     CBG: Recent Labs  Lab 03/28/22 1254  GLUCAP 149*      Georgann Housekeeper, AGACNP-BC [facility]  See Amion for personal pager PCCM on call pager [phone] until 7pm. Please call Elink 7p-7a. [phone]  03/31/2022 8:39 AM

## 2022-04-01 ENCOUNTER — Inpatient Hospital Stay (HOSPITAL_COMMUNITY): Payer: Medicare HMO

## 2022-04-01 DIAGNOSIS — I63512 Cerebral infarction due to unspecified occlusion or stenosis of left middle cerebral artery: Secondary | ICD-10-CM | POA: Diagnosis not present

## 2022-04-01 DIAGNOSIS — I484 Atypical atrial flutter: Secondary | ICD-10-CM | POA: Diagnosis not present

## 2022-04-01 LAB — BASIC METABOLIC PANEL
Anion gap: 8 (ref 5–15)
BUN: 22 mg/dL (ref 8–23)
CO2: 28 mmol/L (ref 22–32)
Calcium: 9.1 mg/dL (ref 8.9–10.3)
Chloride: 104 mmol/L (ref 98–111)
Creatinine, Ser: 0.77 mg/dL (ref 0.44–1.00)
GFR, Estimated: >60 mL/min (ref >60)
Glucose, Bld: 132 mg/dL — ABNORMAL HIGH (ref 70–99)
Potassium: 3.8 mmol/L (ref 3.5–5.1)
Sodium: 140 mmol/L (ref 135–145)

## 2022-04-01 LAB — CBC
HCT: 41.6 % (ref 36.0–46.0)
Hemoglobin: 13.5 g/dL (ref 12.0–15.0)
MCH: 27.8 pg (ref 26.0–34.0)
MCHC: 32.5 g/dL (ref 30.0–36.0)
MCV: 85.8 fL (ref 80.0–100.0)
Platelets: 252 10*3/uL (ref 150–400)
RBC: 4.85 MIL/uL (ref 3.87–5.11)
RDW: 14.5 % (ref 11.5–15.5)
WBC: 12.2 10*3/uL — ABNORMAL HIGH (ref 4.0–10.5)
nRBC: 0 % (ref 0.0–0.2)

## 2022-04-01 LAB — PHOSPHORUS
Phosphorus: 3.1 mg/dL (ref 2.5–4.6)
Phosphorus: 2.8 mg/dL (ref 2.5–4.6)

## 2022-04-01 LAB — MAGNESIUM
Magnesium: 2.3 mg/dL (ref 1.7–2.4)
Magnesium: 2.3 mg/dL (ref 1.7–2.4)

## 2022-04-01 LAB — GLUCOSE, CAPILLARY
Glucose-Capillary: 115 mg/dL — ABNORMAL HIGH (ref 70–99)
Glucose-Capillary: 119 mg/dL — ABNORMAL HIGH (ref 70–99)
Glucose-Capillary: 125 mg/dL — ABNORMAL HIGH (ref 70–99)
Glucose-Capillary: 129 mg/dL — ABNORMAL HIGH (ref 70–99)
Glucose-Capillary: 135 mg/dL — ABNORMAL HIGH (ref 70–99)

## 2022-04-01 MED ORDER — CLOPIDOGREL BISULFATE 75 MG PO TABS: 75.0000 mg | ORAL_TABLET | Freq: Every day | ORAL | Status: DC

## 2022-04-01 MED ORDER — DOCUSATE SODIUM 50 MG/5ML PO LIQD
100.0000 mg | Freq: Two times a day (BID) | ORAL | Status: DC
  Administered 2022-04-01 – 2022-04-06 (×9): 100 mg
  Filled 2022-04-01 (×9): qty 10

## 2022-04-01 MED ORDER — CLOPIDOGREL BISULFATE 75 MG PO TABS
75.0000 mg | ORAL_TABLET | Freq: Every day | ORAL | Status: DC
  Administered 2022-04-01 – 2022-04-06 (×6): 75 mg
  Filled 2022-04-01 (×6): qty 1

## 2022-04-01 MED ORDER — TRAMADOL HCL 50 MG PO TABS
50.0000 mg | ORAL_TABLET | Freq: Four times a day (QID) | ORAL | Status: DC | PRN
  Administered 2022-04-02 – 2022-04-05 (×7): 50 mg
  Filled 2022-04-01 (×8): qty 1

## 2022-04-01 NOTE — Progress Notes (Addendum)
STROKE TEAM PROGRESS NOTE   INTERVAL HISTORY Husband at the bedside. Awaiting CIR placement.  Repeat CT 9/22- No progression of large left MCA territory infarct with petechial and subarachnoid hemorrhage. Talking more today but still perseverating/repeating questions as opposed to answering them.   Vitals:   04/01/22 0400 04/01/22 0500 04/01/22 0600 04/01/22 0804  BP: (!) 143/58 (!) 141/60 (!) 164/64 (!) 145/55  Pulse: 68 62 64 70  Resp: 12 14 (!) 25 15  Temp: 99.2 F (37.3 C)   98.8 F (37.1 C)  TempSrc: Oral   Oral  SpO2: 93% 97% (!) 89% 94%  Weight:      Height:       CBC:  Recent Labs  Lab 03/28/22 1300 03/28/22 1719 03/31/22 0159 04/01/22 0143  WBC 7.3   < > 9.4 12.2*  NEUTROABS 5.4  --   --   --   HGB 15.1*  15.6*   < > 12.8 13.5  HCT 47.0*  46.0   < > 40.2 41.6  MCV 86.6   < > 86.5 85.8  PLT 298   < > 202 252   < > = values in this interval not displayed.    Basic Metabolic Panel:  Recent Labs  Lab 03/31/22 0159 03/31/22 2009 04/01/22 0143  NA 139  --  140  K 3.3*  --  3.8  CL 104  --  104  CO2 27  --  28  GLUCOSE 97  --  132*  BUN 9  --  22  CREATININE 0.70  --  0.77  CALCIUM 8.7*  --  9.1  MG  --  2.2 2.3  PHOS  --  2.8 3.1    Lipid Panel:  Recent Labs  Lab 03/29/22 0839  CHOL 213*  TRIG 364*  HDL 36*  CHOLHDL 5.9  VLDL 73*  LDLCALC 104*    HgbA1c:  Recent Labs  Lab 03/28/22 1300  HGBA1C 5.5    Urine Drug Screen: No results for input(s): "LABOPIA", "COCAINSCRNUR", "LABBENZ", "AMPHETMU", "THCU", "LABBARB" in the last 168 hours.  Alcohol Level  Recent Labs  Lab 03/28/22 1300  ETH <10     IMAGING past 24 hours CT HEAD WO CONTRAST (5MM)  Result Date: 04/01/2022 CLINICAL DATA:  Stroke follow-up EXAM: CT HEAD WITHOUT CONTRAST TECHNIQUE: Contiguous axial images were obtained from the base of the skull through the vertex without intravenous contrast. RADIATION DOSE REDUCTION: This exam was performed according to the departmental  dose-optimization program which includes automated exposure control, adjustment of the mA and/or kV according to patient size and/or use of iterative reconstruction technique. COMPARISON:  Three days ago FINDINGS: Brain: Progressively conspicuous large left MCA territory infarct with high-density vessels and petechial hemorrhage. Unchanged small volume subarachnoid hemorrhage along left parietal sulci. Local mass effect without significant midline shift. No new infarct. Small left cerebellar infarct which is stable. No hydrocephalus. Vascular: High-density left MCA branches at the level of infarction. Skull: Normal. Negative for fracture or focal lesion. Sinuses/Orbits: No acute finding. IMPRESSION: No progression of large left MCA territory infarct with petechial and subarachnoid hemorrhage. Electronically Signed   By: Jorje Guild M.D.   On: 04/01/2022 06:09   DG Abd Portable 1V  Result Date: 03/31/2022 CLINICAL DATA:  Feeding tube placement EXAM: PORTABLE ABDOMEN - 1 VIEW COMPARISON:  Abdomen 03/28/2022 FINDINGS: Feeding tube enters the stomach with the tip in the gastric antrum. Normal bowel gas pattern. Pacemaker noted. IMPRESSION: Feeding tube tip in the gastric  antrum.  Normal bowel gas pattern. Electronically Signed   By: Franchot Gallo M.D.   On: 03/31/2022 15:04    PHYSICAL EXAM  Physical Exam  Constitutional: Appears well-developed and well-nourished.   Cardiovascular: Normal rate and regular rhythm.  Respiratory: Effort normal, non-labored breathing   NEURO:  Mental Status: difficult to assess due to aphasia Speech/Language: speech dysarthric with both receptive and expressive aphasia.  Can intermittently follow commands and repeat short phrases  Cranial Nerves:  II: PERRL. Does not blink to threat on the right III, IV, VI: left gaze deviation, cannot cross midline  VII: slight right facial droop   VIII: hearing intact to voice. XII: tongue is midline without  fasciculations. Motor: 5/5 strength to LUE and LLE, 0/5 to RUE and 2/5 to RLE  Sensation- Appears intact to light touch bilaterally.  Gait- deferred   ASSESSMENT/PLAN [name] is a 76 y.o. female with history of gallstones, HLD, HTN LBBB s/p pacemaker placement and hypotension, presenting to the ED via EMS with acute onset of right hemiplegia and aphasia presenting with via EMS after being found slumped towards the right on the couch by her husband. She received TNK and underwent a mechanical thrombectomy for a proximal left M1 occlusion. Repeat CT Head showed extensive stroke in the left MCA territory with cytotoxic edema and mild midline shift. Moved to step down 9/21. Repeat CT 9/22- No progression of large left MCA territory infarct with petechial and subarachnoid hemorrhage.  Stroke:  Left MCA infarct due to left M1 occlusion s/p TNK and IR with TICI3 complicated by small SAH, etiology unclear  Code Stroke CT head Hyperdensity in the left MCA near the bifurcation and in the proximal M2 branches CTA head & neck Occlusion of the left distal M1 with minimal flow in the proximal left M2 branches and poor perfusion of the remainder of the left MCA territory. Multifocal stenosis in the bilateral PCAs, which is severe at the origin of one of the left P3 branches, moderate in the proximal right P1 and throughout the P2, and mild in the left P2. Poor perfusion of the right P3 segments. IR with TICI3 after 4 passes Post IR CT Small SAH and contrast pending x 2 Not able to have MRI due to pacer incompatible CT repeat 9/19 Increased edema in left MCA territory with no midline shift, likely small SAH in left sylvian fissure and left cerebral sulci 2D Echo 60-65%- normal LV function Pacemaker interrogation showed only 10 seconds of a flutter  LDL 104 HgbA1c 5.5 VTE prophylaxis - SCDs No antithrombotic prior to admission, now on aspirin 81 mg and plavix DAPT Therapy recommendations:  CIR Disposition:  pending  Respiratory failure Intubated before procedure Remain intubated after procedure due to mental status Extubated 9/19 after CT repeat Mild respiratory distress with secretions, no gag or cough High risk for reintubation Close ICU monitoring  Paroxysmal brief Aflutter Pacemaker interrogation showed a 10 seconds of Aflutter Discussed with card EP, does not think enough evidence for anticoagulation Antiplatelet therapy for now Continue pacemaker monitoring  Hypertension Home meds:  Coreg '25mg'$  Stable BP goal < 160 Long-term BP goal normotensive  Hyperlipidemia LDL 104, goal < 70 Resume atorvastatin '40mg'$   Continue statin at discharge  Dysphagia Did not pass a swallow Placed core track, on tube feeding @ 45 Speech on board  Other Stroke Risk Factors Advanced Age >/= 54  Obesity, Body mass index is 33.6 kg/m., BMI >/= 30 associated with increased stroke risk,  recommend weight loss, diet and exercise as appropriate   Other Active Problems LBBB on pacer  Hospital day # 4  Patient seen and examined by NP/APP with MD. MD to update note as needed.   Janine Ores, DNP, FNP-BC [facility] Pager: [phone]  ATTENDING NOTE: I reviewed above note and agree with the assessment and plan. Pt was seen and examined.   Husband at the bedside. Pt sitting in chair, eyes open, more awake alert and responsive. Able to greeting to provider after I greeted her. She is able to orientated to place but not to her age and only repeating questions with moderate dysarthric voice. Follows limited commands and able to mimic a couple of commands. Still has right facial droop and 0/5 RUE and 2+/5 RLE. Repeat CT in am showed large left MCA infarct but stable petechial hemorrhage and SAH. Will add plavix to ASA. Continue tele monitoring. PT/OT recommend CIR.   For detailed assessment and plan, please refer to above/below as I have made changes wherever  appropriate.   Rosalin Hawking, MD PhD Stroke Neurology 04/01/2022 10:13 PM  I had long discussion with husband at bedside, updated pt current condition, treatment plan and potential prognosis, and answered all the questions. He expressed understanding and appreciation.     To contact Stroke Continuity provider, please refer to [email or web address] After hours, contact General Neurology

## 2022-04-01 NOTE — Progress Notes (Addendum)
  Inpatient Rehabilitation Admissions Coordinator   Met with patient and spouse at bedside for rehab assessment. We discussed goals and expectations of a possible CIR admit. Spouse prefers CIR for rehab. Family can provide expected caregiver support that is recommended. I await further tolerance to participate with therapy before beginning insurance approval with [facility]. I will follow up on Monday. Please call me with any questions.   Danne Baxter, RN, MSN Rehab Admissions Coordinator [phone]

## 2022-04-01 NOTE — Care Management Important Message (Signed)
Important Message  Patient Details  Name: Elaine Ford MRN: [id number] Date of Birth: 08/21/1945   Medicare Important Message Given:  Yes     Hannah Beat 04/01/2022, 11:46 AM

## 2022-04-01 NOTE — Progress Notes (Signed)
Physical Therapy Treatment Patient Details Name: Elaine Ford MRN: [id number] DOB: [birth year] Today's Date: 04/01/2022   History of Present Illness Pt is a 76 y.o. female who presented 03/28/22 with R-sided weakness and aphasia. Pt administered TNK. S/p diagnostic cerebral angiogram and mechanical thrombectomy, complicated by small [facility] 9/18. ETT 9/18-9/19. Noted to have left MCA infarct due to left M1 occlusion. PMHx of gallstones, gestational diabetes, HLD, HTN LBBB s/p pacemaker placement and hypotension, CVA    PT Comments    Pt benefits from visual targets on her L to reduce her R lateral lean, which is stronger in standing compared to sitting. She is requiring mod-maxA for transfers at this time, primarily due to her poor midline alignment and R inattention. Focused session on training pt to find and maintain midline in various positions and on transfer training. Will continue to follow acutely. Current recommendations remain appropriate.     Recommendations for follow up therapy are one component of a multi-disciplinary discharge planning process, led by the attending physician.  Recommendations may be updated based on patient status, additional functional criteria and insurance authorization.  Follow Up Recommendations  Acute inpatient rehab (3hours/day)     Assistance Recommended at Discharge Frequent or constant Supervision/Assistance  Patient can return home with the following Two people to help with walking and/or transfers;A lot of help with bathing/dressing/bathroom;Assistance with cooking/housework;Direct supervision/assist for medications management;Direct supervision/assist for financial management;Assist for transportation;Help with stairs or ramp for entrance   Equipment Recommendations  Other (comment) (defer to next venue of care)    Recommendations for Other Services       Precautions / Restrictions Precautions Precautions: Fall;Other (comment) Precaution  Comments: SBP < 160 Restrictions Weight Bearing Restrictions: No     Mobility  Bed Mobility Overal bed mobility: Needs Assistance Bed Mobility: Sit to Supine       Sit to supine: Max assist, HOB elevated   General bed mobility comments: MaxA to manage trunk and legs to supine    Transfers Overall transfer level: Needs assistance Equipment used: 1 person hand held assist Transfers: Sit to/from Stand, Bed to chair/wheelchair/BSC Sit to Stand: Mod assist, Max assist   Step pivot transfers: Mod assist       General transfer comment: ModA to initiate power up to stand with verbal and tactile cues, but as pt became more upright her R lateral lean increased, thereby needing maxA to prevent LOB, x2 bouts from recliner. ModA to steady and direct weight shifting to step to L recliner > bed, good initiation by pt with stepping with no knee buckling noted.    Ambulation/Gait Ambulation/Gait assistance: Mod assist Gait Distance (Feet): 2 Feet Assistive device: 1 person hand held assist Gait Pattern/deviations: Step-through pattern, Decreased step length - right, Decreased stride length, Decreased dorsiflexion - right, Decreased weight shift to right, Decreased weight shift to left Gait velocity: reduced Gait velocity interpretation: <1.31 ft/sec, indicative of household ambulator   General Gait Details: ModA to steady and cue lateral weight shifting to direct pt to step to L recliner > bed. Good initiation by pt to take steps with no knee buckling noted.   Stairs             Wheelchair Mobility    Modified Rankin (Stroke Patients Only) Modified Rankin (Stroke Patients Only) Pre-Morbid Rankin Score: No symptoms Modified Rankin: Moderately severe disability     Balance Overall balance assessment: Needs assistance Sitting-balance support: Single extremity supported, Feet supported Sitting balance-Leahy Scale: Poor Sitting balance -  Comments: ModA initially with R lateral  lean, needing visual target to lean L shoulder to PT on L to reduce lean, progressing to min guard-minA Postural control: Right lateral lean Standing balance support: Single extremity supported Standing balance-Leahy Scale: Poor Standing balance comment: Mod-maxA for standing balance with noted strong R lateral lean                            Cognition Arousal/Alertness: Awake/alert Behavior During Therapy: Flat affect (smiled intermittently) Overall Cognitive Status: Impaired/Different from baseline Area of Impairment: Attention, Following commands, Safety/judgement, Awareness, Problem solving                   Current Attention Level: Sustained   Following Commands: Follows one step commands inconsistently, Follows one step commands with increased time Safety/Judgement: Decreased awareness of safety, Decreased awareness of deficits Awareness: Intellectual Problem Solving: Slow processing, Decreased initiation, Difficulty sequencing, Requires verbal cues, Requires tactile cues General Comments: Pt perseverating on words spoken by daughter intermittently. Responds better to cues in Spanish, translation provided by daughter this session. Follows simple commands inconsistently with delayed processing. Poor awareness of her strong R lean, but benefiting from visual cues/target to reach on L to lean more to L. Neglects R side still        Exercises      General Comments General comments (skin integrity, edema, etc.): VSS on RA      Pertinent Vitals/Pain Pain Assessment Pain Assessment: Faces Faces Pain Scale: No hurt Pain Intervention(s): Monitored during session    Home Living                          Prior Function            PT Goals (current goals can now be found in the care plan section) Acute Rehab PT Goals Patient Stated Goal: did not state, daughter desiring pt to improve PT Goal Formulation: With patient/family Time For Goal  Achievement: 04/13/22 Potential to Achieve Goals: Good Progress towards PT goals: Progressing toward goals    Frequency    Min 4X/week      PT Plan Current plan remains appropriate    Co-evaluation              AM-PAC PT "6 Clicks" Mobility   Outcome Measure  Help needed turning from your back to your side while in a flat bed without using bedrails?: A Little Help needed moving from lying on your back to sitting on the side of a flat bed without using bedrails?: A Lot Help needed moving to and from a bed to a chair (including a wheelchair)?: A Lot Help needed standing up from a chair using your arms (e.g., wheelchair or bedside chair)?: A Lot Help needed to walk in hospital room?: Total Help needed climbing 3-5 steps with a railing? : Total 6 Click Score: 11    End of Session Equipment Utilized During Treatment: Gait belt Activity Tolerance: Patient tolerated treatment well Patient left: with family/visitor present;with nursing/sitter in room;in bed;with call bell/phone within reach;with bed alarm set Nurse Communication: Mobility status;Other (comment) (sats) PT Visit Diagnosis: Unsteadiness on feet (R26.81);Other abnormalities of gait and mobility (R26.89);Muscle weakness (generalized) (M62.81);Difficulty in walking, not elsewhere classified (R26.2);Other symptoms and signs involving the nervous system (R29.898);Hemiplegia and hemiparesis Hemiplegia - Right/Left: Right Hemiplegia - caused by: Cerebral infarction     Time: 7062-3762 PT Time Calculation (min) (ACUTE  ONLY): 26 min  Charges:  $Therapeutic Activity: 8-22 mins $Neuromuscular Re-education: 8-22 mins                     Moishe Spice, PT, DPT Acute Rehabilitation Services  Office: [phone]    Orvan Falconer 04/01/2022, 4:06 PM

## 2022-04-01 NOTE — Progress Notes (Signed)
Speech Language Pathology Treatment: Dysphagia;Cognitive-Linquistic  Patient Details Name: Elaine Ford MRN: [id number] DOB: [birth year] Today's Date: 04/01/2022 Time: 1300-1400 SLP Time Calculation (min) (ACUTE ONLY): 60 min  Assessment / Plan / Recommendation Clinical Impression  Pt  speaking more, still repeating and perseverative. SLP used visual and written and cues and errorless learning methods to encourage improved naming and identification of two familiar objects. Pt finally accurate over about 2 minutes of effort. Pt demonstrates ongoing oropharyngeal dysphagia; was able to self feed with a spoon and was quite eager though she slowed down with verbal and visual cues. However there was delayed weak coughing after about 4 bites. Progressed to ice with moderate assist, pt masticated and swallowed ice without immediate signs of aspiration. Advised family to help pt eat 3-4 pieces of ice each hour to utilize and train swallow mechanism. Will reassess swallowing Monday or Tuesday next week.    HPI HPI: 9/18 admit for CVA.  CT of the head demonstrated hypodensity in the left MCA without hemorrhage.   Systemic TNK mechanical thrombectomy.  Post procedurally on vent to ICU. Postoperative CT did demonstrate small subarachnoid hemorrhage.Extubated 9/19. No MRI at this time.      SLP Plan  Continue with current plan of care      Recommendations for follow up therapy are one component of a multi-disciplinary discharge planning process, led by the attending physician.  Recommendations may be updated based on patient status, additional functional criteria and insurance authorization.    Recommendations  Diet recommendations: NPO (ice chip)                Oral Care Recommendations: Oral care BID Follow Up Recommendations: Acute inpatient rehab (3hours/day) Assistance recommended at discharge: Frequent or constant Supervision/Assistance SLP Visit Diagnosis: Dysphagia, oropharyngeal phase  (R13.12) Plan: Continue with current plan of care           Valecia Beske, Katherene Ponto  04/01/2022, 2:10 PM

## 2022-04-01 NOTE — Progress Notes (Signed)
Occupational Therapy Treatment Patient Details Name: TEKILA CAILLOUET MRN: [id number] DOB: [birth year] Today's Date: 04/01/2022   History of present illness Pt is a 76 y.o. female who presented 03/28/22 with R-sided weakness and aphasia. Pt administered TNK. S/p diagnostic cerebral angiogram and mechanical thrombectomy, complicated by small [facility] 9/18. ETT 9/18-9/19. Noted to have left MCA infarct due to left M1 occlusion. PMHx of gallstones, gestational diabetes, HLD, HTN LBBB s/p pacemaker placement and hypotension, CVA   OT comments  Pt currently mod assist for UB bathing with max assist +2 for LB selfcare.  Still with left gaze preference but was able to turn head and eyes to stimulus on the right occasionally for locating items on bedside table or her spouse/therapist.  Still with decreased ability to follow one step commands related to selfcare tasks.  Feel she will benefit from acute care OT with transition to AIR for more intense rehab prior to home.       Recommendations for follow up therapy are one component of a multi-disciplinary discharge planning process, led by the attending physician.  Recommendations may be updated based on patient status, additional functional criteria and insurance authorization.    Follow Up Recommendations  Acute inpatient rehab (3hours/day)    Assistance Recommended at Discharge Frequent or constant Supervision/Assistance  Patient can return home with the following  A lot of help with walking and/or transfers;A lot of help with bathing/dressing/bathroom;Assistance with cooking/housework;Help with stairs or ramp for entrance;Assist for transportation;Direct supervision/assist for financial management;Direct supervision/assist for medications management   Equipment Recommendations  Other (comment) (TBD next venue of care)    Recommendations for Other Services Rehab consult    Precautions / Restrictions Precautions Precautions: Fall;Other (comment) Precaution  Comments: SBP < 160 Restrictions Weight Bearing Restrictions: No       Mobility Bed Mobility Overal bed mobility: Needs Assistance Bed Mobility: Supine to Sit     Supine to sit: Total assist          Transfers Overall transfer level: Needs assistance Equipment used: 1 person hand held assist Transfers: Sit to/from Stand Sit to Stand: Max assist     Step pivot transfers: Max assist     General transfer comment: Pt needed max facilitation to advance the RLE with stepping to the recliner.  Flexed trunk and head in standing.     Balance Overall balance assessment: Needs assistance Sitting-balance support: Single extremity supported, Feet supported Sitting balance-Leahy Scale: Poor Sitting balance - Comments: Occasional LOB posteriorly and to the right.   Standing balance support: During functional activity Standing balance-Leahy Scale: Poor Standing balance comment: Max assist for standing balance and transfer stand pivot                           ADL either performed or assessed with clinical judgement   ADL Overall ADL's : Needs assistance/impaired     Grooming: Wash/dry hands;Wash/dry face;Moderate assistance;Sitting Grooming Details (indicate cue type and reason): EOB unsupported Upper Body Bathing: Moderate assistance;Sitting Upper Body Bathing Details (indicate cue type and reason): EOB unsupported Lower Body Bathing: Maximal assistance;+2 for safety/equipment;Sit to/from stand           Toilet Transfer: Maximal assistance;Stand-pivot           Functional mobility during ADLs: Maximal assistance;Cueing for sequencing;Cueing for safety General ADL Comments: Pt worked on selfcare retraining EOB.  Min assist for static sitting balance initially progressing to min guard.  Increased head tilt to  the right and slight lean.  Max assist for dynamic sitting balance with bathing tasks when therapist would guide her left arm across midline for reaching  the washcloth or soap on the bedside table.  Pt unable to understand and initiate when given command to reach for the soap.  Max demonstrational cueing for understanding and application on the washcloth with the LUE while stabilizing it in the right hemi paretic hand.  Still keeps head turned to the left with occasional scan across midline to the right for locating spouse and therapist.  Issued handout for RUE PROM exercises with demonstration to spouse.  Slight tone noted in the internal rotators of the shoulder.      Cognition Arousal/Alertness: Awake/alert Behavior During Therapy: Flat affect Overall Cognitive Status: Impaired/Different from baseline Area of Impairment: Attention, Following commands, Safety/judgement, Awareness, Problem solving                   Current Attention Level: Sustained   Following Commands: Follows one step commands inconsistently Safety/Judgement: Decreased awareness of safety, Decreased awareness of deficits Awareness: Intellectual Problem Solving: Slow processing, Decreased initiation, Difficulty sequencing, Requires verbal cues, Requires tactile cues General Comments: Pt not able to answer simple questions asked in Spanish such as "Is my shirt blue or red?".  She would just repeat "blue or red" in Spanish.  Max demonstrational cueing for initiation and completion of most tasks.                           Pertinent Vitals/ Pain       Pain Assessment Pain Assessment: Faces Pain Score: 0-No pain                                                          Frequency  Min 3X/week        Progress Toward Goals  OT Goals(current goals can now be found in the care plan section)  Progress towards OT goals: Progressing toward goals  Acute Rehab OT Goals Patient Stated Goal: Pt did not state during session, but family is hoping for AIR. OT Goal Formulation: With family Time For Goal Achievement: 04/13/22 Potential  to Achieve Goals: Good  Plan Discharge plan remains appropriate       AM-PAC OT "6 Clicks" Daily Activity     Outcome Measure   Help from another person eating meals?: A Lot Help from another person taking care of personal grooming?: A Lot Help from another person toileting, which includes using toliet, bedpan, or urinal?: Total Help from another person bathing (including washing, rinsing, drying)?: Total Help from another person to put on and taking off regular upper body clothing?: Total Help from another person to put on and taking off regular lower body clothing?: Total 6 Click Score: 8    End of Session    OT Visit Diagnosis: Unsteadiness on feet (R26.81);Other abnormalities of gait and mobility (R26.89);Muscle weakness (generalized) (M62.81);Cognitive communication deficit (R41.841);Other symptoms and signs involving cognitive function;Hemiplegia and hemiparesis Symptoms and signs involving cognitive functions: Cerebral infarction Hemiplegia - Right/Left: Right Hemiplegia - dominant/non-dominant: Dominant Hemiplegia - caused by: Cerebral infarction   Activity Tolerance Patient tolerated treatment well   Patient Left in bed;with call bell/phone within reach;in chair;with chair alarm set   Nurse Communication  Need for lift equipment        Time: 9276-3943 OT Time Calculation (min): 69 min  Charges: OT General Charges $OT Visit: 1 Visit OT Treatments $Self Care/Home Management : 68-82 mins  Luis Nickles OTR/L  04/01/2022, 10:45 AM

## 2022-04-02 DIAGNOSIS — I63412 Cerebral infarction due to embolism of left middle cerebral artery: Secondary | ICD-10-CM | POA: Diagnosis not present

## 2022-04-02 LAB — CBC
HCT: 39.6 % (ref 36.0–46.0)
Hemoglobin: 12.8 g/dL (ref 12.0–15.0)
MCH: 27.7 pg (ref 26.0–34.0)
MCHC: 32.3 g/dL (ref 30.0–36.0)
MCV: 85.7 fL (ref 80.0–100.0)
Platelets: 250 10*3/uL (ref 150–400)
RBC: 4.62 MIL/uL (ref 3.87–5.11)
RDW: 14.6 % (ref 11.5–15.5)
WBC: 7.2 10*3/uL (ref 4.0–10.5)
nRBC: 0 % (ref 0.0–0.2)

## 2022-04-02 LAB — GLUCOSE, CAPILLARY
Glucose-Capillary: 107 mg/dL — ABNORMAL HIGH (ref 70–99)
Glucose-Capillary: 111 mg/dL — ABNORMAL HIGH (ref 70–99)
Glucose-Capillary: 125 mg/dL — ABNORMAL HIGH (ref 70–99)
Glucose-Capillary: 126 mg/dL — ABNORMAL HIGH (ref 70–99)
Glucose-Capillary: 131 mg/dL — ABNORMAL HIGH (ref 70–99)
Glucose-Capillary: 134 mg/dL — ABNORMAL HIGH (ref 70–99)

## 2022-04-02 LAB — BASIC METABOLIC PANEL
Anion gap: 8 (ref 5–15)
BUN: 27 mg/dL — ABNORMAL HIGH (ref 8–23)
CO2: 26 mmol/L (ref 22–32)
Calcium: 8.8 mg/dL — ABNORMAL LOW (ref 8.9–10.3)
Chloride: 106 mmol/L (ref 98–111)
Creatinine, Ser: 0.73 mg/dL (ref 0.44–1.00)
GFR, Estimated: >60 mL/min (ref >60)
Glucose, Bld: 118 mg/dL — ABNORMAL HIGH (ref 70–99)
Potassium: 3.6 mmol/L (ref 3.5–5.1)
Sodium: 140 mmol/L (ref 135–145)

## 2022-04-02 LAB — PHOSPHORUS: Phosphorus: 3.2 mg/dL (ref 2.5–4.6)

## 2022-04-02 LAB — MAGNESIUM: Magnesium: 2.3 mg/dL (ref 1.7–2.4)

## 2022-04-02 NOTE — Plan of Care (Signed)
  Problem: Education: Goal: Understanding of CV disease, CV risk reduction, and recovery process will improve Outcome: Progressing Goal: Individualized Educational Video(s) Outcome: Progressing   Problem: Activity: Goal: Ability to return to baseline activity level will improve Outcome: Progressing   Problem: Cardiovascular: Goal: Ability to achieve and maintain adequate cardiovascular perfusion will improve Outcome: Progressing Goal: Vascular access site(s) Level 0-1 will be maintained Outcome: Progressing   Problem: Health Behavior/Discharge Planning: Goal: Ability to safely manage health-related needs after discharge will improve Outcome: Progressing   Problem: Activity: Goal: Ability to tolerate increased activity will improve Outcome: Progressing   Problem: Respiratory: Goal: Ability to maintain a clear airway and adequate ventilation will improve Outcome: Progressing   Problem: Role Relationship: Goal: Method of communication will improve Outcome: Progressing   Problem: Education: Goal: Knowledge of disease or condition will improve Outcome: Progressing Goal: Knowledge of secondary prevention will improve (SELECT ALL) Outcome: Progressing Goal: Knowledge of patient specific risk factors will improve (INDIVIDUALIZE FOR PATIENT) Outcome: Progressing Goal: Individualized Educational Video(s) Outcome: Progressing   Problem: Coping: Goal: Will verbalize positive feelings about self Outcome: Progressing Goal: Will identify appropriate support needs Outcome: Progressing   Problem: Health Behavior/Discharge Planning: Goal: Ability to manage health-related needs will improve Outcome: Progressing   Problem: Self-Care: Goal: Ability to participate in self-care as condition permits will improve Outcome: Progressing Goal: Verbalization of feelings and concerns over difficulty with self-care will improve Outcome: Progressing Goal: Ability to communicate needs  accurately will improve Outcome: Progressing   Problem: Nutrition: Goal: Risk of aspiration will decrease Outcome: Progressing Goal: Dietary intake will improve Outcome: Progressing   Problem: Ischemic Stroke/TIA Tissue Perfusion: Goal: Complications of ischemic stroke/TIA will be minimized Outcome: Progressing

## 2022-04-02 NOTE — Progress Notes (Addendum)
STROKE TEAM PROGRESS NOTE   INTERVAL HISTORY Family reports that she has been singing and speaking a little more today as well as moving her right lower extremity. Difficulty voiding earlier today and required in and out cath with 450cc of urine drained. Hoping to transfer to CIR on Monday.   Daughter, son, and husband at the bedside and have noticed improvements day by day with her speaking and alertness.  Vital signs stable.  Blood pressure adequately controlled. Vitals:   04/02/22 0300 04/02/22 0500 04/02/22 0806 04/02/22 0815  BP: (!) 139/58 (!) 180/54 (!) 170/55   Pulse: 63 63 64 63  Resp: '12 14 18 16  '$ Temp: 99.1 F (37.3 C) 99.4 F (37.4 C) 98.8 F (37.1 C)   TempSrc: Axillary Oral Oral   SpO2: 98% 94% 96% 96%  Weight:  88.8 kg    Height:       CBC:  Recent Labs  Lab 03/28/22 1300 03/28/22 1719 04/01/22 0143 04/02/22 0559  WBC 7.3   < > 12.2* 7.2  NEUTROABS 5.4  --   --   --   HGB 15.1*  15.6*   < > 13.5 12.8  HCT 47.0*  46.0   < > 41.6 39.6  MCV 86.6   < > 85.8 85.7  PLT 298   < > 252 250   < > = values in this interval not displayed.    Basic Metabolic Panel:  Recent Labs  Lab 04/01/22 0143 04/01/22 1703 04/02/22 0559  NA 140  --  140  K 3.8  --  3.6  CL 104  --  106  CO2 28  --  26  GLUCOSE 132*  --  118*  BUN 22  --  27*  CREATININE 0.77  --  0.73  CALCIUM 9.1  --  8.8*  MG 2.3 2.3 2.3  PHOS 3.1 2.8 3.2    Lipid Panel:  Recent Labs  Lab 03/29/22 0839  CHOL 213*  TRIG 364*  HDL 36*  CHOLHDL 5.9  VLDL 73*  LDLCALC 104*    HgbA1c:  Recent Labs  Lab 03/28/22 1300  HGBA1C 5.5    Urine Drug Screen: No results for input(s): "LABOPIA", "COCAINSCRNUR", "LABBENZ", "AMPHETMU", "THCU", "LABBARB" in the last 168 hours.  Alcohol Level  Recent Labs  Lab 03/28/22 1300  ETH <10     IMAGING past 24 hours No results found.  PHYSICAL EXAM  Physical Exam  Constitutional: Appears well-developed and well-nourished.   Cardiovascular: Normal  rate and regular rhythm.  Respiratory: Effort normal, non-labored breathing   NEURO:  Mental Status: Drowsy but can be aroused but not following commands consistently.  Speech/Language: speech dysarthric with both receptive and expressive aphasia.  Can intermittently follow commands and repeat short phrases  Cranial Nerves:  II: PERRL. Does not blink to threat on the right III, IV, VI: left gaze deviation, cannot cross midline  VII: slight right facial droop   VIII: hearing intact to voice. XII: tongue is midline without fasciculations. Motor: 5/5 strength to LUE and LLE, 0/5 to RUE and 2/5 to RLE  Sensation- Appears intact to light touch bilaterally.  Gait- deferred   ASSESSMENT/PLAN Ms. KEYSHLA TUNISON is a 76 y.o. female with history of gallstones, HLD, HTN LBBB s/p pacemaker placement and hypotension, presenting to the ED via EMS with acute onset of right hemiplegia and aphasia presenting with via EMS after being found slumped towards the right on the couch by her husband. She received TNK  and underwent a mechanical thrombectomy for a proximal left M1 occlusion. Repeat CT Head showed extensive stroke in the left MCA territory with cytotoxic edema and mild midline shift. Moved to step down 9/21. Repeat CT 9/22- No progression of large left MCA territory infarct with petechial and subarachnoid hemorrhage.  Stroke:  Left MCA infarct due to left M1 occlusion s/p TNK and IR with TICI3 complicated by small SAH, etiology unclear  Code Stroke CT head Hyperdensity in the left MCA near the bifurcation and in the proximal M2 branches CTA head & neck Occlusion of the left distal M1 with minimal flow in the proximal left M2 branches and poor perfusion of the remainder of the left MCA territory. Multifocal stenosis in the bilateral PCAs, which is severe at the origin of one of the left P3 branches, moderate in the proximal right P1 and throughout the P2, and mild in the left P2. Poor perfusion of the  right P3 segments. IR with TICI3 after 4 passes Post IR CT Small SAH and contrast pending x 2 Not able to have MRI due to pacer incompatible CT repeat 9/19 Increased edema in left MCA territory with no midline shift, likely small SAH in left sylvian fissure and left cerebral sulci 2D Echo 60-65%- normal LV function Pacemaker interrogation showed only 10 seconds of a flutter  LDL 104 HgbA1c 5.5 VTE prophylaxis - SCDs No antithrombotic prior to admission, now on aspirin 81 mg and plavix DAPT Therapy recommendations: CIR Disposition:  pending  Respiratory failure Intubated before procedure Remain intubated after procedure due to mental status Extubated 9/19 after CT repeat Mild respiratory distress with secretions, no gag or cough High risk for reintubation Close ICU monitoring  Paroxysmal brief Aflutter Pacemaker interrogation showed a 10 seconds of Aflutter Discussed with card EP, does not think enough evidence for anticoagulation Antiplatelet therapy for now Continue pacemaker monitoring  Hypertension Home meds:  Coreg '25mg'$  Stable BP goal < 160 Long-term BP goal normotensive  Hyperlipidemia LDL 104, goal < 70 Resume atorvastatin '40mg'$   Continue statin at discharge  Dysphagia Did not pass a swallow Placed core track, on tube feeding @ 45 Speech on board  Other Stroke Risk Factors Advanced Age >/= 49  Obesity, Body mass index is 33.6 kg/m., BMI >/= 30 associated with increased stroke risk, recommend weight loss, diet and exercise as appropriate   Other Active Problems LBBB on pacer  Hospital day # 5  Patient seen and examined by NP/APP with MD. MD to update note as needed.   Janine Ores, DNP, FNP-BC [facility] Pager: [phone] I have personally obtained history,examined this patient, reviewed notes, independently viewed imaging studies, participated in medical decision making and plan of care.ROS completed by me personally and pertinent  positives fully documented  I have made any additions or clarifications directly to the above note. Agree with note above.  Patient remains aphasic with right hemiparesis.  Continue ongoing therapies and mobilize out of bed.  Transfer to neurology floor bed when available.  Likely transfer to inpatient rehab early next week.  Long discussion with patient, husband and daughter and son at the bedside and answered questions.  Greater than 50% time during this 35-minute visit was spent in counseling and coordination of care and discussion with patient and care team and answering questions about her stroke and prognosis discussion about plan of care  Antony Contras, MD Medical Director [place] Pager: [phone] 04/02/2022 3:53 PM   To contact Stroke Continuity provider,  please refer to [email or web address] After hours, contact General Neurology

## 2022-04-02 NOTE — Progress Notes (Signed)
Patient unable to void. Bladder scan performed with a total of 450 scanned.  In and out cath performed and a total of 450 ml of urine drained.

## 2022-04-03 DIAGNOSIS — I63412 Cerebral infarction due to embolism of left middle cerebral artery: Secondary | ICD-10-CM | POA: Diagnosis not present

## 2022-04-03 LAB — GLUCOSE, CAPILLARY
Glucose-Capillary: 104 mg/dL — ABNORMAL HIGH (ref 70–99)
Glucose-Capillary: 126 mg/dL — ABNORMAL HIGH (ref 70–99)
Glucose-Capillary: 121 mg/dL — ABNORMAL HIGH (ref 70–99)
Glucose-Capillary: 131 mg/dL — ABNORMAL HIGH (ref 70–99)
Glucose-Capillary: 143 mg/dL — ABNORMAL HIGH (ref 70–99)
Glucose-Capillary: 124 mg/dL — ABNORMAL HIGH (ref 70–99)

## 2022-04-03 LAB — CBC
HCT: 38.8 % (ref 36.0–46.0)
Hemoglobin: 12.9 g/dL (ref 12.0–15.0)
MCH: 28.4 pg (ref 26.0–34.0)
MCHC: 33.2 g/dL (ref 30.0–36.0)
MCV: 85.3 fL (ref 80.0–100.0)
Platelets: 263 10*3/uL (ref 150–400)
RBC: 4.55 MIL/uL (ref 3.87–5.11)
RDW: 14.5 % (ref 11.5–15.5)
WBC: 7.8 10*3/uL (ref 4.0–10.5)
nRBC: 0 % (ref 0.0–0.2)

## 2022-04-03 LAB — BASIC METABOLIC PANEL
Anion gap: 9 (ref 5–15)
BUN: 20 mg/dL (ref 8–23)
CO2: 28 mmol/L (ref 22–32)
Calcium: 8.8 mg/dL — ABNORMAL LOW (ref 8.9–10.3)
Chloride: 106 mmol/L (ref 98–111)
Creatinine, Ser: 0.7 mg/dL (ref 0.44–1.00)
GFR, Estimated: >60 mL/min (ref >60)
Glucose, Bld: 89 mg/dL (ref 70–99)
Potassium: 4 mmol/L (ref 3.5–5.1)
Sodium: 143 mmol/L (ref 135–145)

## 2022-04-03 NOTE — Progress Notes (Addendum)
STROKE TEAM PROGRESS NOTE   INTERVAL HISTORY Patient evaluated at beside, reports feeling well. Family at bedside, no acute complaints. Able to answer some questions, still perseverative. Pending discharge to CIR on Monday.  Vital signs stable.  Neurological exam is unchanged. Vitals:   04/02/22 2337 04/03/22 0346 04/03/22 0500 04/03/22 0810  BP: (!) 150/58 (!) 140/60  (!) 189/58  Pulse: 65 68  65  Resp: '20 19  12  '$ Temp: 98.8 F (37.1 C) 99.8 F (37.7 C)  99 F (37.2 C)  TempSrc: Oral Oral  Oral  SpO2: 97% 95%  97%  Weight:   88.8 kg   Height:       CBC:  Recent Labs  Lab 03/28/22 1300 03/28/22 1719 04/02/22 0559 04/03/22 0234  WBC 7.3   < > 7.2 7.8  NEUTROABS 5.4  --   --   --   HGB 15.1*  15.6*   < > 12.8 12.9  HCT 47.0*  46.0   < > 39.6 38.8  MCV 86.6   < > 85.7 85.3  PLT 298   < > 250 263   < > = values in this interval not displayed.   Basic Metabolic Panel:  Recent Labs  Lab 04/01/22 1703 04/02/22 0559 04/03/22 0234  NA  --  140 143  K  --  3.6 4.0  CL  --  106 106  CO2  --  26 28  GLUCOSE  --  118* 89  BUN  --  27* 20  CREATININE  --  0.73 0.70  CALCIUM  --  8.8* 8.8*  MG 2.3 2.3  --   PHOS 2.8 3.2  --    Lipid Panel:  Recent Labs  Lab 03/29/22 0839  CHOL 213*  TRIG 364*  HDL 36*  CHOLHDL 5.9  VLDL 73*  LDLCALC 104*   HgbA1c:  Recent Labs  Lab 03/28/22 1300  HGBA1C 5.5   Urine Drug Screen: No results for input(s): "LABOPIA", "COCAINSCRNUR", "LABBENZ", "AMPHETMU", "THCU", "LABBARB" in the last 168 hours.  Alcohol Level  Recent Labs  Lab 03/28/22 1300  ETH <10    IMAGING past 24 hours No results found.  PHYSICAL EXAM  Physical Exam  Constitutional: Appears well-developed and well-nourished.   Cardiovascular: Normal rate and regular rhythm.  Respiratory: Effort normal, non-labored breathing   NEURO:  Mental Status: Awake, alert today Speech/Language: speech dysarthric with both receptive and expressive aphasia.  Can  intermittently follow commands and repeat short phrases  Cranial Nerves:  II: PERRL. Does not blink to threat on the right III, IV, VI: left gaze deviation, cannot cross midline  VII: slight right facial droop   VIII: hearing intact to voice. XII: tongue is midline without fasciculations. Motor: 5/5 strength to LUE and LLE, 0/5 to RUE and 2/5 to RLE  Sensation- Appears intact to light touch bilaterally.  Gait- deferred   ASSESSMENT/PLAN Ms. DONEISHA IVEY is a 76 y.o. female with history of gallstones, HLD, HTN LBBB s/p pacemaker placement and hypotension, presenting to the ED via EMS with acute onset of right hemiplegia and aphasia presenting with via EMS after being found slumped towards the right on the couch by her husband. She received TNK and underwent a mechanical thrombectomy for a proximal left M1 occlusion. Repeat CT Head showed extensive stroke in the left MCA territory with cytotoxic edema and mild midline shift. Moved to step down 9/21. Repeat CT 9/22- No progression of large left MCA territory infarct with petechial  and subarachnoid hemorrhage.  Stroke:  Left MCA infarct due to left M1 occlusion s/p TNK and IR with TICI3 complicated by small SAH, etiology unclear  Code Stroke CT head Hyperdensity in the left MCA near the bifurcation and in the proximal M2 branches CTA head & neck Occlusion of the left distal M1 with minimal flow in the proximal left M2 branches and poor perfusion of the remainder of the left MCA territory. Multifocal stenosis in the bilateral PCAs, which is severe at the origin of one of the left P3 branches, moderate in the proximal right P1 and throughout the P2, and mild in the left P2. Poor perfusion of the right P3 segments. IR with TICI3 after 4 passes Post IR CT Small SAH and contrast pending x 2 Not able to have MRI due to pacer incompatible CT repeat 9/19 Increased edema in left MCA territory with no midline shift, likely small SAH in left sylvian  fissure and left cerebral sulci 2D Echo 60-65%- normal LV function Pacemaker interrogation showed only 10 seconds of a flutter  LDL 104 HgbA1c 5.5 VTE prophylaxis - SCDs No antithrombotic prior to admission, now on aspirin 81 mg and plavix DAPT Therapy recommendations: CIR on Monday  Disposition:  pending  Respiratory failure Intubated before procedure Remain intubated after procedure due to mental status Extubated 9/19 after CT repeat Mild respiratory distress with secretions, no gag or cough High risk for reintubation Close ICU monitoring  Paroxysmal brief Aflutter Pacemaker interrogation showed a 10 seconds of Aflutter Discussed with card EP, does not think enough evidence for anticoagulation Antiplatelet therapy for now Continue pacemaker monitoring  Hypertension Home meds:  Coreg '25mg'$  Stable BP goal < 160 Long-term BP goal normotensive  Hyperlipidemia LDL 104, goal < 70 Resume atorvastatin '40mg'$   Continue statin at discharge  Dysphagia Did not pass a swallow Placed core track, on tube feeding @ 45 Speech on board  Other Stroke Risk Factors Advanced Age >/= 15  Obesity, Body mass index is 33.6 kg/m., BMI >/= 30 associated with increased stroke risk, recommend weight loss, diet and exercise as appropriate   Other Active Problems LBBB on pacer  Hospital day # 6  Christene Slates, MD PGY-1   I have personally obtained history,examined this patient, reviewed notes, independently viewed imaging studies, participated in medical decision making and plan of care.ROS completed by me personally and pertinent positives fully documented  I have made any additions or clarifications directly to the above note. Agree with note above.  Patient neurological exam remains unchanged.  Long discussion at bedside with the patient's husband and son and answered questions.  Await transfer to inpatient rehab when bed available in the next few days.  Antony Contras, MD Medical  Director [facility] Pager: [phone] 04/03/2022 4:28 PM   To contact Stroke Continuity provider, please refer to [email or web address] After hours, contact General Neurology

## 2022-04-04 DIAGNOSIS — I63412 Cerebral infarction due to embolism of left middle cerebral artery: Secondary | ICD-10-CM | POA: Diagnosis not present

## 2022-04-04 LAB — GLUCOSE, CAPILLARY
Glucose-Capillary: 106 mg/dL — ABNORMAL HIGH (ref 70–99)
Glucose-Capillary: 115 mg/dL — ABNORMAL HIGH (ref 70–99)
Glucose-Capillary: 133 mg/dL — ABNORMAL HIGH (ref 70–99)
Glucose-Capillary: 139 mg/dL — ABNORMAL HIGH (ref 70–99)
Glucose-Capillary: 141 mg/dL — ABNORMAL HIGH (ref 70–99)
Glucose-Capillary: 144 mg/dL — ABNORMAL HIGH (ref 70–99)
Glucose-Capillary: 94 mg/dL (ref 70–99)

## 2022-04-04 NOTE — Progress Notes (Signed)
Gave PRN medication for BP>160

## 2022-04-04 NOTE — Progress Notes (Addendum)
STROKE TEAM PROGRESS NOTE   INTERVAL HISTORY Patient evaluated at beside, no acute changes, still perseverative and unable to answer questions. Follows commands. Pending discharge to CIR, waiting on insurance auth process. Vitals remain stable, neurological exam unchanged. Her husband is at the bedside.   Vitals:   04/04/22 0500 04/04/22 0800 04/04/22 1128 04/04/22 1256  BP:   (!) 166/71 (!) 168/54  Pulse:   62 64  Resp:   (!) 22 16  Temp:  98.5 F (36.9 C)    TempSrc:  Oral    SpO2:   (!) 89% 97%  Weight: 88.8 kg     Height:       CBC:  Recent Labs  Lab 04/02/22 0559 04/03/22 0234  WBC 7.2 7.8  HGB 12.8 12.9  HCT 39.6 38.8  MCV 85.7 85.3  PLT 250 712   Basic Metabolic Panel:  Recent Labs  Lab 04/01/22 1703 04/02/22 0559 04/03/22 0234  NA  --  140 143  K  --  3.6 4.0  CL  --  106 106  CO2  --  26 28  GLUCOSE  --  118* 89  BUN  --  27* 20  CREATININE  --  0.73 0.70  CALCIUM  --  8.8* 8.8*  MG 2.3 2.3  --   PHOS 2.8 3.2  --    Lipid Panel:  Recent Labs  Lab 03/29/22 0839  CHOL 213*  TRIG 364*  HDL 36*  CHOLHDL 5.9  VLDL 73*  LDLCALC 104*   HgbA1c:  No results for input(s): "HGBA1C" in the last 168 hours.  Urine Drug Screen: No results for input(s): "LABOPIA", "COCAINSCRNUR", "LABBENZ", "AMPHETMU", "THCU", "LABBARB" in the last 168 hours.  Alcohol Level  No results for input(s): "ETH" in the last 168 hours.   IMAGING past 24 hours No results found.  PHYSICAL EXAM  Physical Exam  Constitutional: Appears well-developed and well-nourished.   Cardiovascular: Normal rate and regular rhythm.  Respiratory: Effort normal, non-labored breathing   NEURO:  Mental Status: Awake, alert today Speech/Language: speech dysarthric with both receptive and expressive aphasia.  Can intermittently follow commands and repeat short phrases  Cranial Nerves:  II: PERRL. Does not blink to threat on the right III, IV, VI: left gaze deviation, cannot cross midline   VII: slight right facial droop   VIII: hearing intact to voice. XII: tongue is midline without fasciculations. Motor: 5/5 strength to LUE and LLE, 0/5 to RUE and 2/5 to RLE  Sensation- Appears intact to light touch bilaterally.  Gait- deferred   ASSESSMENT/PLAN [name] is a 76 y.o. female with history of gallstones, HLD, HTN LBBB s/p pacemaker placement and hypotension, presenting to the ED via EMS with acute onset of right hemiplegia and aphasia presenting with via EMS after being found slumped towards the right on the couch by her husband. She received TNK and underwent a mechanical thrombectomy for a proximal left M1 occlusion. Repeat CT Head showed extensive stroke in the left MCA territory with cytotoxic edema and mild midline shift. Moved to step down 9/21. Repeat CT 9/22- No progression of large left MCA territory infarct with petechial and subarachnoid hemorrhage.  Stroke:  Left MCA infarct due to left M1 occlusion s/p TNK and IR with TICI3 complicated by small SAH, etiology unclear  Code Stroke CT head Hyperdensity in the left MCA near the bifurcation and in the proximal M2 branches CTA head & neck Occlusion of the left distal M1 with minimal  flow in the proximal left M2 branches and poor perfusion of the remainder of the left MCA territory. Multifocal stenosis in the bilateral PCAs, which is severe at the origin of one of the left P3 branches, moderate in the proximal right P1 and throughout the P2, and mild in the left P2. Poor perfusion of the right P3 segments. IR with TICI3 after 4 passes Post IR CT Small SAH and contrast pending x 2 Not able to have MRI due to pacer incompatible CT repeat 9/19 Increased edema in left MCA territory with no midline shift, likely small SAH in left sylvian fissure and left cerebral sulci 2D Echo 60-65%- normal LV function Pacemaker interrogation showed only 10 seconds of a flutter  LDL 104 HgbA1c 5.5 VTE prophylaxis - SCDs No  antithrombotic prior to admission, now on aspirin 81 mg and plavix DAPTx 3 weeks and then aspirin alone Therapy recommendations: CIR on Monday  Disposition:  pending  Respiratory failure Intubated before procedure Remain intubated after procedure due to mental status Extubated 9/19 after CT repeat Mild respiratory distress with secretions, no gag or cough High risk for reintubation Close ICU monitoring  Paroxysmal brief Aflutter Pacemaker interrogation showed a 10 seconds of Aflutter Discussed with card EP, does not think enough evidence for anticoagulation Antiplatelet therapy for now Continue pacemaker monitoring  Hypertension Home meds:  Coreg '25mg'$  Stable BP goal < 160 Long-term BP goal normotensive  Hyperlipidemia LDL 104, goal < 70 Resume atorvastatin '40mg'$   Continue statin at discharge  Dysphagia Did not pass a swallow Placed core track, on tube feeding @ 45 Speech on board  Other Stroke Risk Factors Advanced Age >/= 26  Obesity, Body mass index is 33.6 kg/m., BMI >/= 30 associated with increased stroke risk, recommend weight loss, diet and exercise as appropriate   Other Active Problems LBBB on pacer  Hospital day # 7  Christene Slates, MD PGY-1   I have personally obtained history,examined this patient, reviewed notes, independently viewed imaging studies, participated in medical decision making and plan of care.ROS completed by me personally and pertinent positives fully documented  I have made any additions or clarifications directly to the above note. Agree with note above.  Continue ongoing physical occupational therapy.  Medically stable to be transferred to inpatient rehab when bed available.  Antony Contras, MD Medical Director [facility] Pager: [phone] 04/04/2022 7:24 PM   To contact Stroke Continuity provider, please refer to [email or web address] After hours, contact General Neurology

## 2022-04-04 NOTE — Progress Notes (Signed)
Physical Therapy Treatment Patient Details Name: Elaine Ford MRN: [id number] DOB: [birth year] Today's Date: 04/04/2022   History of Present Illness Pt is a 76 y.o. female who presented 03/28/22 with R-sided weakness and aphasia. Pt administered TNK. S/p diagnostic cerebral angiogram and mechanical thrombectomy, complicated by small [facility] 9/18. ETT 9/18-9/19. Noted to have left MCA infarct due to left M1 occlusion. PMHx of gallstones, gestational diabetes, HLD, HTN LBBB s/p pacemaker placement and hypotension, CVA    PT Comments    Patient progressing with R side awareness, movement and balance.  She struggles with communication, but spouse attempting to assist with language.  Patient able to move to [facility] and then to recliner with mod to max A of 1 without device and mod A of 2 with RW.  She seems eager to progress.  Feel she will benefit from acute inpatient rehab prior to d/c home with spouse assist.   Recommendations for follow up therapy are one component of a multi-disciplinary discharge planning process, led by the attending physician.  Recommendations may be updated based on patient status, additional functional criteria and insurance authorization.  Follow Up Recommendations  Acute inpatient rehab (3hours/day)     Assistance Recommended at Discharge Frequent or constant Supervision/Assistance  Patient can return home with the following Two people to help with walking and/or transfers;A lot of help with bathing/dressing/bathroom;Assistance with cooking/housework;Direct supervision/assist for medications management;Direct supervision/assist for financial management;Assist for transportation;Help with stairs or ramp for entrance   Equipment Recommendations  Other (comment)    Recommendations for Other Services       Precautions / Restrictions Precautions Precautions: Fall Precaution Comments: SBP < 160     Mobility  Bed Mobility Overal bed mobility: Needs Assistance Bed Mobility:  Supine to Sit     Supine to sit: Mod assist, HOB elevated     General bed mobility comments: using L UE to pull up with therapist support, scooting out to EOB with A using bed pad    Transfers Overall transfer level: Needs assistance Equipment used: 1 person hand held assist, Rolling walker (2 wheels) Transfers: Sit to/from Stand, Bed to chair/wheelchair/BSC Sit to Stand: Mod assist Stand pivot transfers: Max assist Step pivot transfers: Mod assist, +2 physical assistance       General transfer comment: assist for balance as pt pivoting to BSC on L LE, R foot staying still and supported at knee; used RW for balance and to take steps again with L foot to turn toward recliner, but leaning R so assist to bring chair behind for safey    Ambulation/Gait                   Stairs             Wheelchair Mobility    Modified Rankin (Stroke Patients Only) Modified Rankin (Stroke Patients Only) Pre-Morbid Rankin Score: No symptoms Modified Rankin: Severe disability     Balance Overall balance assessment: Needs assistance Sitting-balance support: Feet supported Sitting balance-Leahy Scale: Poor Sitting balance - Comments: initially leaning R so pt using foot board for balance with L UE, then progressed to supervision with time and visual input for orientation; performed some reaching with L UE in sitting and kicking out legs for dynamic balance work                                    Cognition Arousal/Alertness: Awake/alert Behavior During  Therapy: Flat affect Overall Cognitive Status: Difficult to assess Area of Impairment: Attention, Following commands, Safety/judgement, Awareness, Problem solving                   Current Attention Level: Sustained   Following Commands: Follows one step commands inconsistently, Follows one step commands with increased time Safety/Judgement: Decreased awareness of safety, Decreased awareness of deficits    Problem Solving: Slow processing, Difficulty sequencing, Requires verbal cues, Requires tactile cues General Comments: automatic with initiating mobility today, but still leans R with support, some difficulty following commands difficult to tell if due to aphasia versus cognition        Exercises      General Comments General comments (skin integrity, edema, etc.): spouse in the room and translating at times to help pt's understanding or to help her communicate, she does use English some, but difficulty with word finding and she seems to realize she is saying the wrong word.      Pertinent Vitals/Pain Pain Assessment Faces Pain Scale: Hurts little more Pain Location: unable to state Pain Descriptors / Indicators: Discomfort Pain Intervention(s): Monitored during session, Repositioned    Home Living                          Prior Function            PT Goals (current goals can now be found in the care plan section) Progress towards PT goals: Progressing toward goals    Frequency           PT Plan Current plan remains appropriate    Co-evaluation              AM-PAC PT "6 Clicks" Mobility   Outcome Measure  Help needed turning from your back to your side while in a flat bed without using bedrails?: A Little Help needed moving from lying on your back to sitting on the side of a flat bed without using bedrails?: A Lot Help needed moving to and from a bed to a chair (including a wheelchair)?: A Lot Help needed standing up from a chair using your arms (e.g., wheelchair or bedside chair)?: A Lot Help needed to walk in hospital room?: Total Help needed climbing 3-5 steps with a railing? : Total 6 Click Score: 11    End of Session Equipment Utilized During Treatment: Gait belt Activity Tolerance: Patient tolerated treatment well Patient left: in chair;with call bell/phone within reach;with chair alarm set   PT Visit Diagnosis: Other abnormalities of  gait and mobility (R26.89);Other symptoms and signs involving the nervous system (R29.898);Hemiplegia and hemiparesis Hemiplegia - Right/Left: Right Hemiplegia - dominant/non-dominant: Dominant Hemiplegia - caused by: Cerebral infarction     Time: 1110-1140 PT Time Calculation (min) (ACUTE ONLY): 30 min  Charges:  $Therapeutic Activity: 8-22 mins                     Magda Kiel, PT Acute Rehabilitation Services Office:[phone] 04/04/2022    Reginia Naas 04/04/2022, 11:56 AM

## 2022-04-04 NOTE — PMR Pre-admission (Signed)
PMR Admission Coordinator Pre-Admission Assessment  Patient: Elaine Ford is an 76 y.o., female MRN: [id number] DOB: 1945-12-10 Height: '5\' 4"'  (162.6 cm) Weight: 88.8 kg  Insurance Information HMO: ***    PPO: ***     PCP:      IPA:      80/20:      OTHER:  PRIMARY: [employer]      Policy#: [id number]      Subscriber: pt CM Name: ***      Phone#: ***     Fax#: *** Pre-Cert#: ***      Employer: *** Benefits:  Phone #: [phone]     Name: 9/25 Eff. Date: ***     Deduct: ***      Out of Pocket Max: ***      Life Max: *** CIR: ***      SNF: *** Outpatient: ***     Co-Pay: *** Home Health: ***      Co-Pay: *** DME: ***     Co-Pay: *** Providers: in network  SECONDARY: none      Policy#:      Phone#:   Development worker, community:       Phone#:   The Engineer, petroleum" for patients in Inpatient Rehabilitation Facilities with attached "Privacy Act [place] Records" was provided and verbally reviewed with: Patient and Family  Emergency Contact Information Contact Information     Name Relation Home Work [place] Spouse [phone]  [phone]      Current Medical History  Patient Admitting Diagnosis: CVA  History of Present Illness: 76 year old female with history of gallstones, gestational diabetes, HLD, HTN, LBBB s/p pacemaker and hypotension. Presented on 03/28/22 with acute onset right hemiplegia and aphasia.   She received TNK and underwent a mechanical thrombectomy for a proximal left M1 occlusion. Repeat head CT showed extensive stroke in the Left T MCA territory with cytotoxic edema and mild midline shift. Post IR with CT small SAH. Not able to do MRI due to pacemaker. 2 D echo 60-65 % normal LV function. Pacemaker interrogated showed only 10 seconds of a flutter. Cardiology consulted and felt not enough evidence for anticoagulation. LDL 104,m Hgb A1c 5.5. On no antithrombotic pta, now on ASA and Plavix. Coreg for HTN. Resume  atorvastatin at discharge. SLP evaluation and did not pass swallow. Cortrak placed for tube feeding.   Complete NIHSS TOTAL: 20  Patient's medical record from [facility] has been reviewed by the rehabilitation admission coordinator and physician.  Past Medical History  Past Medical History:  Diagnosis Date   Back pain    Broken arm    age 89 years old broke right arm after a fall   Gall bladder stones    Gestational diabetes    Hemorrhoid    Hx of cardiac cath    [facility] 07/[facility]/14 at [facility] - no significant CAD   Hx of cardiovascular stress test    [facility] 11/14:  No ischemia   Hx of echocardiogram    Echo 07/23/14 - at [facility]:  EF 65-70%, impaired relaxation, mild LAE, mild MR, mild AI, mild elevated PASP (44.2 mmHg)   Hyperlipidemia    Hypertension    LBBB (left bundle branch block)    Low blood pressure    Nerve damage    right arm   Pain    Left upper chest s/p pacer maker insertion site 07/2014   S/P cardiac pacemaker procedure 07/2014   St[employer] Jude -  Dr. Lovena Le   Stroke [facility])    patient was unaware noted prior to pacermaker placement unknown when it occurred    Has the patient had major surgery during 100 days prior to admission? Yes  Family History   family history includes Heart attack in her father; Heart disease in her brother; Stroke in her father.  Current Medications  Current Facility-Administered Medications:    acetaminophen (TYLENOL) suppository 650 mg, 650 mg, Rectal, Q6H PRN, Mauri Brooklyn, MD, 650 mg at 04/01/22 1742   aspirin chewable tablet 81 mg, 81 mg, Per Tube, Daily, 81 mg at 04/04/22 0907 **OR** [DISCONTINUED] aspirin suppository 150 mg, 150 mg, Rectal, Daily, Millen, Jessica B, RPH   atorvastatin (LIPITOR) tablet 40 mg, 40 mg, Per Tube, Daily, Priscella Mann, RPH, 40 mg at 04/04/22 0907   carvedilol (COREG) tablet 25 mg, 25 mg, Per Tube, BID WC, Corey Harold, NP, 25 mg at 04/04/22 6811   Chlorhexidine Gluconate Cloth 2 % PADS 6 each, 6 each,  Topical, Daily, Kerney Elbe, MD, 6 each at 04/03/22 0829   clopidogrel (PLAVIX) tablet 75 mg, 75 mg, Per Tube, Daily, Shafer, Devon, NP, 75 mg at 04/04/22 0907   docusate (COLACE) 50 MG/5ML liquid 100 mg, 100 mg, Per Tube, BID, Rozann Lesches, RPH, 100 mg at 04/04/22 5726   feeding supplement (OSMOLITE 1.5 CAL) liquid 1,000 mL, 1,000 mL, Per Tube, Continuous, Corey Harold, NP, Last Rate: 45 mL/hr at 04/03/22 0142, 1,000 mL at 04/03/22 0142   feeding supplement (PROSource TF20) liquid 60 mL, 60 mL, Per Tube, Daily, Corey Harold, NP, 60 mL at 04/04/22 0906   hydrALAZINE (APRESOLINE) injection 10 mg, 10 mg, Intravenous, Q4H PRN, de Yolanda Manges, Cortney E, NP, 10 mg at 04/04/22 1256   labetalol (NORMODYNE) injection 20 mg, 20 mg, Intravenous, Q2H PRN, Rosalin Hawking, MD, 20 mg at 03/31/22 1812   Oral care mouth rinse, 15 mL, Mouth Rinse, PRN, Kerney Elbe, MD   pantoprazole (PROTONIX) 2 mg/mL oral suspension 40 mg, 40 mg, Per Tube, QHS, Millen, Jessica B, RPH, 40 mg at 04/03/22 2201   senna-docusate (Senokot-S) tablet 1 tablet, 1 tablet, Per Tube, QHS PRN, Kerney Elbe, MD, 1 tablet at 04/03/22 2201   traMADol (ULTRAM) tablet 50 mg, 50 mg, Per Tube, Q6H PRN, Amie Portland, MD, 50 mg at 04/04/22 1256  Patients Current Diet:  Diet Order             Diet NPO time specified Except for: Ice Chips  Diet effective now                  Precautions / Restrictions Precautions Precautions: Fall Precaution Comments: SBP < 160 Restrictions Weight Bearing Restrictions: No   Has the patient had 2 or more falls or a fall with injury in the past year? No  Prior Activity Level Community (5-7x/wk): Independent and active without AD  Prior Functional Level Self Care: Did the patient need help bathing, dressing, using the toilet or eating? Independent  Indoor Mobility: Did the patient need assistance with walking from room to room (with or without device)? Independent  Stairs: Did the patient  need assistance with internal or external stairs (with or without device)? Independent  Functional Cognition: Did the patient need help planning regular tasks such as shopping or remembering to take medications? Independent  Patient Information Are you of Hispanic, Latino/a,or Spanish origin?: C. Yes, [place] What is your race?: A. White Do you need or want  an interpreter to communicate with a doctor or health care staff?: 9. Unable to respond  Patient's Response To:  Health Literacy and Transportation Is the patient able to respond to health literacy and transportation needs?: No Health Literacy - How often do you need to have someone help you when you read instructions, pamphlets, or other written material from your doctor or pharmacy?: Patient unable to respond In the past 12 months, has lack of transportation kept you from medical appointments or from getting medications?: No In the past 12 months, has lack of transportation kept you from meetings, work, or from getting things needed for daily living?: No  Development worker, international aid / [place]: Grab bars - toilet, Grab bars - tub/shower, Cane - single point, Shower seat  Prior Device Use: Indicate devices/aids used by the patient prior to current illness, exacerbation or injury? None of the above  Current Functional Level Cognition  Arousal/Alertness: Awake/alert Overall Cognitive Status: Difficult to assess Difficult to assess due to: Impaired communication, Non-English speaking Current Attention Level: Sustained Orientation Level: Oriented to person, Disoriented to place, Disoriented to time, Disoriented to situation Following Commands: Follows one step commands inconsistently, Follows one step commands with increased time Safety/Judgement: Decreased awareness of safety, Decreased awareness of deficits General Comments: automatic with initiating mobility today, but still leans R with support, some difficulty  following commands difficult to tell if due to aphasia versus cognition. pt had full appropriate conversation in [place] with husband at the end of the session., Attention: Focused, Sustained Focused Attention: Appears intact Sustained Attention: Appears intact    Extremity Assessment (includes Sensation/Coordination)  Upper Extremity Assessment: RUE deficits/detail RUE Deficits / Details: PROM remains WFL, slight edema throughout. Tract activation noted proximally. Hand over hand assist for oral care RUE Sensation: decreased light touch, decreased proprioception RUE Coordination: decreased fine motor, decreased gross motor  Lower Extremity Assessment: Defer to PT evaluation RLE Deficits / Details: Noted weakness grossly throughout, difficult to formally assess due to cog/communication deficits, but no noted buckling with weight bearing, difficulty flexing hip to advance foot though; noted decreased sensation through observing lack or minimal withdrawal to noxious stimuli RLE Sensation: decreased light touch RLE Coordination: decreased gross motor    ADLs  Overall ADL's : Needs assistance/impaired Grooming: Minimal assistance, Total assistance Grooming Details (indicate cue type and reason): min A for LUE use; total A for RUE involvement with hadn over hand assist Upper Body Bathing: Moderate assistance, Sitting Upper Body Bathing Details (indicate cue type and reason): EOB unsupported Lower Body Bathing: Maximal assistance, +2 for safety/equipment, Sit to/from stand Lower Body Bathing Details (indicate cue type and reason): simulated Lower Body Dressing: Maximal assistance, +2 for safety/equipment, Sit to/from stand Lower Body Dressing Details (indicate cue type and reason): simulated Toilet Transfer: Moderate assistance, +2 for physical assistance, +2 for safety/equipment, Stand-pivot, BSC/3in1, Rolling walker (2 wheels) Toilet Transfer Details (indicate cue type and reason): simulated  chair to bed Toileting- Clothing Manipulation and Hygiene: Maximal assistance, +2 for physical assistance, +2 for safety/equipment, Sit to/from stand Toileting - Clothing Manipulation Details (indicate cue type and reason): hygiene in standing after toileting Functional mobility during ADLs: Moderate assistance, +2 for physical assistance, +2 for safety/equipment, Rolling walker (2 wheels), Cueing for safety, Cueing for sequencing General ADL Comments: continues to be limieted by R hemi, R inattention and impiared communication. improved sitting balance and transfers. Pt continues to have R lateral lean, but able to take small steps with mod  A+2  Mobility  Overal bed mobility: Needs Assistance Bed Mobility: Supine to Sit Supine to sit: Mod assist, HOB elevated Sit to supine: Max assist, HOB elevated General bed mobility comments: using L UE to pull up with therapist support, scooting out to EOB with A using bed pad    Transfers  Overall transfer level: Needs assistance Equipment used: 1 person hand held assist, Rolling walker (2 wheels) Transfers: Sit to/from Stand, Bed to chair/wheelchair/BSC Sit to Stand: Mod assist Bed to/from chair/wheelchair/BSC transfer type:: Stand pivot Stand pivot transfers: Max assist Step pivot transfers: Mod assist, +2 physical assistance General transfer comment: assist for balance as pt pivoting to BSC on L LE, R foot staying still and supported at knee; used RW for balance and to take steps again with L foot to turn toward recliner, but leaning R so assist to bring chair behind for safey    Ambulation / Gait / Stairs / Wheelchair Mobility  Ambulation/Gait Ambulation/Gait assistance: Mod assist Gait Distance (Feet): 2 Feet Assistive device: 1 person hand held assist Gait Pattern/deviations: Step-through pattern, Decreased step length - right, Decreased stride length, Decreased dorsiflexion - right, Decreased weight shift to right, Decreased weight shift  to left General Gait Details: ModA to steady and cue lateral weight shifting to direct pt to step to L recliner > bed. Good initiation by pt to take steps with no knee buckling noted. Gait velocity: reduced Gait velocity interpretation: <1.31 ft/sec, indicative of household ambulator Pre-gait activities: MaxA for lateral weight shifting and cuing pt to lift contralateral foot while standing at EOB with UE support on therapist anterior to her. Pt with strong R lateral lean    Posture / Balance Dynamic Sitting Balance Sitting balance - Comments: initially leaning R so pt using foot board for balance with L UE, then progressed to supervision with time and visual input for orientation; performed some reaching with L UE in sitting and kicking out legs for dynamic balance work Balance Overall balance assessment: Needs assistance Sitting-balance support: Feet supported Sitting balance-Leahy Scale: Poor Sitting balance - Comments: initially leaning R so pt using foot board for balance with L UE, then progressed to supervision with time and visual input for orientation; performed some reaching with L UE in sitting and kicking out legs for dynamic balance work Postural control: Right lateral lean Standing balance support: Single extremity supported Standing balance-Leahy Scale: Poor Standing balance comment: Mod-maxA for standing balance with noted strong R lateral lean    Special needs/care consideration 10 fr left nare feeding tube placed on 9/21   Previous Home Environment  Living Arrangements: Spouse/significant other  Lives With: Spouse Available Help at Discharge: Family, Available 24 hours/day Type of Home: House Home Layout: Two level, 1/2 bath on main level, Bed/bath upstairs, Able to live on main level with bedroom/bathroom Alternate Level Stairs-Rails: Left Alternate Level Stairs-Number of Steps: 13 Home Access: Stairs to enter Entrance Stairs-Rails: None Entrance Stairs-Number of  Steps: 1 Bathroom Shower/Tub: Chiropodist: Handicapped height Bathroom Accessibility: Yes How Accessible: Accessible via walker [place]: No Additional Comments: Pt was driving as well  Discharge Living Setting Plans for Discharge Living Setting: Patient's home, Lives with (comment) (spouse) Type of Home at Discharge: House Discharge Home Layout: Two level, 1/2 bath on main level, Able to live on main level with bedroom/bathroom Alternate Level Stairs-Rails: Left Alternate Level Stairs-Number of Steps: 13 Discharge Home Access: Stairs to enter Entrance Stairs-Rails: None Entrance Stairs-Number of Steps: 1 Discharge Bathroom Shower/Tub: Tub/shower  unit Discharge Bathroom Toilet: Handicapped height Discharge Bathroom Accessibility: Yes How Accessible: Accessible via walker Does the patient have any problems obtaining your medications?: No  Social/Family/Support Systems Patient Roles: Spouse, Parent Contact Information: spouse, Cory Roughen Anticipated Caregiver: spouse Anticipated Caregiver's Contact Information: see contacts Ability/Limitations of Caregiver: no limitations Caregiver Availability: 24/7 Discharge Plan Discussed with Primary Caregiver: Yes Is Caregiver In Agreement with Plan?: Yes Does Caregiver/Family have Issues with Lodging/Transportation while Pt is in Rehab?: No  Goals Patient/Family Goal for Rehab: min asisst with PT, OT and SLP Expected length of stay: ELOS 14 to 20 days Cultural Considerations: typically fluent [place], but now Spanish more naturally Pt/Family Agrees to Admission and willing to participate: Yes Program Orientation Provided & Reviewed with Pt/Caregiver Including Roles  & Responsibilities: Yes  Decrease burden of Care through IP rehab admission: n/a  Possible need for SNF placement upon discharge: not anticipated  Patient Condition: I have reviewed medical records from [facility], spoken with CM, and  patient and spouse. I met with patient at the bedside for inpatient rehabilitation assessment.  Patient will benefit from ongoing PT, OT, and SLP, can actively participate in 3 hours of therapy a day 5 days of the week, and can make measurable gains during the admission.  Patient will also benefit from the coordinated team approach during an Inpatient Acute Rehabilitation admission.  The patient will receive intensive therapy as well as Rehabilitation physician, nursing, social worker, and care management interventions.  Due to bladder management, bowel management, safety, skin/wound care, disease management, medication administration, pain management, and patient education the patient requires 24 hour a day rehabilitation nursing.  The patient is currently *** with mobility and basic ADLs.  Discharge setting and therapy post discharge at home with home health is anticipated.  Patient has agreed to participate in the Acute Inpatient Rehabilitation Program and will admit {Time; today/tomorrow:10263}.  Preadmission Screen Completed By:  Cleatrice Burke, 04/04/2022 1:20 PM ______________________________________________________________________   Discussed status with Dr. Marland Kitchen on *** at *** and received approval for admission today.  Admission Coordinator:  Cleatrice Burke, RN, time Marland KitchenSudie Grumbling ***   Assessment/Plan: Diagnosis: Does the need for close, 24 hr/day Medical supervision in concert with the patient's rehab needs make it unreasonable for this patient to be served in a less intensive setting? {yes_no_potentially:3041433} Co-Morbidities requiring supervision/potential complications: *** Due to {due XB:1478295}, does the patient require 24 hr/day rehab nursing? {yes_no_potentially:3041433} Does the patient require coordinated care of a physician, rehab nurse, PT, OT, and SLP to address physical and functional deficits in the context of the above medical diagnosis(es)?  {yes_no_potentially:3041433} Addressing deficits in the following areas: {deficits:3041436} Can the patient actively participate in an intensive therapy program of at least 3 hrs of therapy 5 days a week? {yes_no_potentially:3041433} The potential for patient to make measurable gains while on inpatient rehab is {potential:3041437} Anticipated functional outcomes upon discharge from inpatient rehab: {functional outcomes:304600100} PT, {functional outcomes:304600100} OT, {functional outcomes:304600100} SLP Estimated rehab length of stay to reach the above functional goals is: *** Anticipated discharge destination: {anticipated dc setting:21604} 10. Overall Rehab/Functional Prognosis: {potential:3041437}   MD Signature: ***

## 2022-04-04 NOTE — Progress Notes (Signed)
Occupational Therapy Treatment Patient Details Name: Elaine Ford MRN: [id number] DOB: 04-Mar-1946 Today's Date: 04/04/2022   History of present illness Pt is a 76 y.o. female who presented 03/28/22 with R-sided weakness and aphasia. Pt administered TNK. S/p diagnostic cerebral angiogram and mechanical thrombectomy, complicated by small [facility] 9/18. ETT 9/18-9/19. Noted to have left MCA infarct due to left M1 occlusion. PMHx of gallstones, gestational diabetes, HLD, HTN LBBB s/p pacemaker placement and hypotension, CVA   OT comments  [place] is incrementally progressing towards her acute goals. Notable functional improvements in attention, midline and R gaze/attention, command following, standing balance and transfers. Overall she required mod A +2 and RW for sit<>stand transfers and to take small pivotal steps. Trace activation noted in proximal RUE, but pt ultimately needed total  A hand over hand for oral hygiene and lotion application to RLE with RUE. Pt continues to benefit from OT acutely and she remains an excellent rehab candidate.    Recommendations for follow up therapy are one component of a multi-disciplinary discharge planning process, led by the attending physician.  Recommendations may be updated based on patient status, additional functional criteria and insurance authorization.    Follow Up Recommendations  Acute inpatient rehab (3hours/day)    Assistance Recommended at Discharge Frequent or constant Supervision/Assistance  Patient can return home with the following  A lot of help with walking and/or transfers;A lot of help with bathing/dressing/bathroom;Assistance with cooking/housework;Help with stairs or ramp for entrance;Assist for transportation;Direct supervision/assist for financial management;Direct supervision/assist for medications management   Equipment Recommendations  Other (comment)    Recommendations for Other Services Rehab consult    Precautions / Restrictions  Precautions Precautions: Fall Precaution Comments: SBP < 160 Restrictions Weight Bearing Restrictions: No       Mobility Bed Mobility Overal bed mobility: Needs Assistance Bed Mobility: Supine to Sit           General bed mobility comments: pt OOB with PT upon arrival    Transfers Overall transfer level: Needs assistance Equipment used: 1 person hand held assist, Rolling walker (2 wheels) Transfers: Sit to/from Stand, Bed to chair/wheelchair/BSC Sit to Stand: Mod assist Stand pivot transfers: Max assist   Step pivot transfers: Mod assist, +2 physical assistance     General transfer comment: assist for balance as pt pivoting to BSC on L LE, R foot staying still and supported at knee; used RW for balance and to take steps again with L foot to turn toward recliner, but leaning R so assist to bring chair behind for safey     Balance Overall balance assessment: Needs assistance Sitting-balance support: Feet supported Sitting balance-Leahy Scale: Poor Sitting balance - Comments: initially leaning R so pt using foot board for balance with L UE, then progressed to supervision with time and visual input for orientation; performed some reaching with L UE in sitting and kicking out legs for dynamic balance work Postural control: Right lateral lean Standing balance support: Single extremity supported Standing balance-Leahy Scale: Poor Standing balance comment: Mod-maxA for standing balance with noted strong R lateral lean                           ADL either performed or assessed with clinical judgement   ADL Overall ADL's : Needs assistance/impaired     Grooming: Minimal assistance;Total assistance Grooming Details (indicate cue type and reason): min A for LUE use; total A for RUE involvement with hadn over hand assist  Toilet Transfer: Moderate assistance;+2 for physical assistance;+2 for safety/equipment;Stand-pivot;BSC/3in1;Rolling walker (2  wheels)   Toileting- Clothing Manipulation and Hygiene: Maximal assistance;+2 for physical assistance;+2 for safety/equipment;Sit to/from stand Toileting - Clothing Manipulation Details (indicate cue type and reason): hygiene in standing after toileting     Functional mobility during ADLs: Moderate assistance;+2 for physical assistance;+2 for safety/equipment;Rolling walker (2 wheels);Cueing for safety;Cueing for sequencing General ADL Comments: continues to be limited by R hemi, R inattention and impiared communication. improved sitting balance and transfers. Pt continues to have R lateral lean, but able to take small steps with RW and mod  A+2    Extremity/Trunk Assessment Upper Extremity Assessment Upper Extremity Assessment: RUE deficits/detail RUE Deficits / Details: PROM remains WFL, slight edema throughout. Tract activation noted proximally. Hand over hand assist for oral care RUE Sensation: decreased light touch;decreased proprioception RUE Coordination: decreased fine motor;decreased gross motor   Lower Extremity Assessment Lower Extremity Assessment: Defer to PT evaluation        Vision   Vision Assessment?: Vision impaired- to be further tested in functional context Additional Comments: pt spontaneously crossing midline and attending to stimuli in the R environment. Poor R visual sustained attention   Perception Perception Perception: Impaired   Praxis Praxis Praxis: Impaired Praxis Impairment Details: Initiation Praxis-Other Comments: imrpoved this session, stil requires cues    Cognition Arousal/Alertness: Awake/alert Behavior During Therapy: Flat affect Overall Cognitive Status: Difficult to assess Area of Impairment: Attention, Following commands, Safety/judgement, Awareness, Problem solving                   Current Attention Level: Sustained   Following Commands: Follows one step commands inconsistently, Follows one step commands with increased  time Safety/Judgement: Decreased awareness of safety, Decreased awareness of deficits Awareness: Intellectual Problem Solving: Slow processing, Difficulty sequencing, Requires verbal cues, Requires tactile cues General Comments: automatic with initiating mobility today, but still leans R with support, some difficulty following commands difficult to tell if due to aphasia versus cognition. pt had full appropriate conversation in [place] with husband at the end of the session.,        Exercises      Shoulder Instructions       General Comments husband in the room, translating.    Pertinent Vitals/ Pain       Pain Assessment Pain Assessment: Faces Faces Pain Scale: No hurt Pain Intervention(s): Monitored during session  Home Living         Home Access: Stairs to enter   Entrance Stairs-Rails: None Home Layout: Two level;1/2 bath on main level;Bed/bath upstairs;Able to live on main level with bedroom/bathroom Alternate Level Stairs-Number of Steps: 13 Alternate Level Stairs-Rails: Left Bathroom Shower/Tub: Teacher, early years/pre: Handicapped height Bathroom Accessibility: Yes How Accessible: Accessible via walker     Additional Comments: Pt was driving as well      Prior Functioning/Environment              Frequency  Min 3X/week        Progress Toward Goals  OT Goals(current goals can now be found in the care plan section)  Progress towards OT goals: Progressing toward goals  Acute Rehab OT Goals Patient Stated Goal: per husband to go to rehab OT Goal Formulation: With family Time For Goal Achievement: 04/13/22 Potential to Achieve Goals: Good ADL Goals Pt Will Perform Grooming: sitting;with supervision Pt Will Perform Upper Body Bathing: with min assist;sitting Pt Will Perform Lower Body Bathing: with mod assist;sit to/from  stand Pt Will Transfer to Toilet: with mod assist;stand pivot transfer;bedside commode Pt/caregiver will Perform  Home Exercise Program: Increased ROM;Right Upper extremity;With written HEP provided;With Supervision Additional ADL Goal #1: Pt will scan right of midline to located grooming/bathing items with no more than min demonstrational cueing. Additional ADL Goal #2: Pt will maintain dynamic sitting balance EOB unsupported with no more than min assist during bathing tasks.  Plan Discharge plan remains appropriate    Co-evaluation    PT/OT/SLP Co-Evaluation/Treatment: Yes Reason for Co-Treatment: Complexity of the patient's impairments (multi-system involvement);For patient/therapist safety;To address functional/ADL transfers   OT goals addressed during session: ADL's and self-care      AM-PAC OT "6 Clicks" Daily Activity     Outcome Measure   Help from another person eating meals?: A Lot Help from another person taking care of personal grooming?: A Lot Help from another person toileting, which includes using toliet, bedpan, or urinal?: Total Help from another person bathing (including washing, rinsing, drying)?: Total Help from another person to put on and taking off regular upper body clothing?: Total Help from another person to put on and taking off regular lower body clothing?: Total 6 Click Score: 8    End of Session Equipment Utilized During Treatment: Gait belt;Rolling walker (2 wheels)  OT Visit Diagnosis: Unsteadiness on feet (R26.81);Other abnormalities of gait and mobility (R26.89);Muscle weakness (generalized) (M62.81);Cognitive communication deficit (R41.841);Other symptoms and signs involving cognitive function;Hemiplegia and hemiparesis Symptoms and signs involving cognitive functions: Cerebral infarction Hemiplegia - Right/Left: Right Hemiplegia - dominant/non-dominant: Dominant Hemiplegia - caused by: Cerebral infarction   Activity Tolerance Patient tolerated treatment well   Patient Left in bed;with call bell/phone within reach;in chair;with chair alarm set   Nurse  Communication Need for lift equipment        Time: 1129-1151 OT Time Calculation (min): 22 min  Charges: OT General Charges $OT Visit: 1 Visit OT Treatments $Self Care/Home Management : 8-22 mins    Elliot Cousin 04/04/2022, 1:26 PM

## 2022-04-04 NOTE — Progress Notes (Signed)
Inpatient Rehabilitation Admissions Coordinator    I have asked therapy for updates so that I can begin insurance with [facility] for possible admit this week pending insurance approval. I met with patient and her spouse at bedside and they are in agreement and aware of plan.  Danne Baxter, RN, MSN Rehab Admissions Coordinator [phone] 04/04/2022 10:27 AM

## 2022-04-05 LAB — GLUCOSE, CAPILLARY
Glucose-Capillary: 121 mg/dL — ABNORMAL HIGH (ref 70–99)
Glucose-Capillary: 126 mg/dL — ABNORMAL HIGH (ref 70–99)
Glucose-Capillary: 127 mg/dL — ABNORMAL HIGH (ref 70–99)
Glucose-Capillary: 128 mg/dL — ABNORMAL HIGH (ref 70–99)
Glucose-Capillary: 143 mg/dL — ABNORMAL HIGH (ref 70–99)
Glucose-Capillary: 143 mg/dL — ABNORMAL HIGH (ref 70–99)

## 2022-04-05 MED ORDER — PHENOL 1.4 % MT LIQD
2.0000 | OROMUCOSAL | Status: DC | PRN
  Administered 2022-04-05: 2 via OROMUCOSAL
  Filled 2022-04-05: qty 177

## 2022-04-05 NOTE — Progress Notes (Signed)
Physical Therapy Treatment Patient Details Name: Elaine Ford MRN: [id number] DOB: 12-04-1945 Today's Date: 04/05/2022   History of Present Illness Pt is a 76 y.o. female who presented 03/28/22 with R-sided weakness and aphasia. Pt administered TNK. S/p diagnostic cerebral angiogram and mechanical thrombectomy, complicated by small [facility] 9/18. ETT 9/18-9/19. Noted to have left MCA infarct due to left M1 occlusion. PMHx of gallstones, gestational diabetes, HLD, HTN LBBB s/p pacemaker placement and hypotension, CVA    PT Comments    Patient progressing in session with sitting balance and standing tolerance.  Somewhat limited/distracted by bleeding from lower gumline, RN aware.  Son present and supportive (but lives in [place]), spouse also arrived end of session.  Patient remains excellent candidate for acute inpatient rehab.  PT will continue to follow acutely.    Recommendations for follow up therapy are one component of a multi-disciplinary discharge planning process, led by the attending physician.  Recommendations may be updated based on patient status, additional functional criteria and insurance authorization.  Follow Up Recommendations  Acute inpatient rehab (3hours/day)     Assistance Recommended at Discharge Frequent or constant Supervision/Assistance  Patient can return home with the following Two people to help with walking and/or transfers;A lot of help with bathing/dressing/bathroom;Assistance with cooking/housework;Direct supervision/assist for medications management;Assist for transportation;Help with stairs or ramp for entrance   Equipment Recommendations  Other (comment) (TBA)    Recommendations for Other Services       Precautions / Restrictions Precautions Precautions: Fall Precaution Comments: SBP < 160; R lateral lean     Mobility  Bed Mobility Overal bed mobility: Needs Assistance Bed Mobility: Rolling, Sidelying to Sit Rolling: Mod assist Sidelying to sit: Mod  assist, Max assist       General bed mobility comments: rolled to R with cues and assist for reaching to rail, assist to lift trunk and for R LE off bed and due to R lateral lean    Transfers Overall transfer level: Needs assistance Equipment used: 1 person hand held assist Transfers: Sit to/from Stand, Bed to chair/wheelchair/BSC Sit to Stand: Mod assist Stand pivot transfers: Mod assist, +2 safety/equipment         General transfer comment: up to stand x 2 with A for R knee block and for upright balance with son present and cue to pt for visual vertical and correcting R lateral head lean with improved weight shift to L; step pivot to recliner with assist for moving R foot and blocking knee for safety, son present for HHA on L as well during transition to chair.    Ambulation/Gait                   Stairs             Wheelchair Mobility    Modified Rankin (Stroke Patients Only) Modified Rankin (Stroke Patients Only) Pre-Morbid Rankin Score: No symptoms Modified Rankin: Severe disability     Balance Overall balance assessment: Needs assistance Sitting-balance support: Feet supported Sitting balance-Leahy Scale: Poor Sitting balance - Comments: R lateral lean; assist for upright orientation in sitting using son for visual target and correcting head to neutral for improved visual orientation   Standing balance support: Single extremity supported Standing balance-Leahy Scale: Poor Standing balance comment: Mod-maxA for standing balance with noted strong R lateral lean                            Cognition Arousal/Alertness:  Awake/alert Behavior During Therapy: Flat affect Overall Cognitive Status: Impaired/Different from baseline Area of Impairment: Attention, Following commands, Safety/judgement, Problem solving, Awareness                   Current Attention Level: Sustained   Following Commands: Follows one step commands  inconsistently, Follows one step commands with increased time Safety/Judgement: Decreased awareness of safety, Decreased awareness of deficits Awareness: Intellectual Problem Solving: Slow processing, Difficulty sequencing, Requires verbal cues, Requires tactile cues General Comments: patient answering some in English, but needs some commands in Spanish and son present and assisting when pt not following commands, but not following either to stick out her tongue to assess bleeding (likely due to aphasia)        Exercises      General Comments General comments (skin integrity, edema, etc.): son in the room initially and spouse arrived at end.  While sitting began noting bloody drainage from her mouth and noted some blood pooling at lower L teeth, RN in to inspect and aware; assisted with oral hygiene using suction.      Pertinent Vitals/Pain Pain Assessment Faces Pain Scale: Hurts little more Pain Location: throat Pain Descriptors / Indicators: Discomfort, Sore Pain Intervention(s): Monitored during session (RN already aware)    Home Living                          Prior Function            PT Goals (current goals can now be found in the care plan section) Progress towards PT goals: Progressing toward goals    Frequency    Min 4X/week      PT Plan Current plan remains appropriate    Co-evaluation              AM-PAC PT "6 Clicks" Mobility   Outcome Measure  Help needed turning from your back to your side while in a flat bed without using bedrails?: A Little Help needed moving from lying on your back to sitting on the side of a flat bed without using bedrails?: Total Help needed moving to and from a bed to a chair (including a wheelchair)?: A Lot Help needed standing up from a chair using your arms (e.g., wheelchair or bedside chair)?: A Lot Help needed to walk in hospital room?: Total Help needed climbing 3-5 steps with a railing? : Total 6 Click  Score: 10    End of Session Equipment Utilized During Treatment: Gait belt Activity Tolerance: Patient tolerated treatment well Patient left: in chair;with call bell/phone within reach;with chair alarm set;with family/visitor present   PT Visit Diagnosis: Other abnormalities of gait and mobility (R26.89);Other symptoms and signs involving the nervous system (R29.898);Hemiplegia and hemiparesis Hemiplegia - Right/Left: Right Hemiplegia - dominant/non-dominant: Dominant Hemiplegia - caused by: Cerebral infarction     Time: 7035-0093 PT Time Calculation (min) (ACUTE ONLY): 47 min  Charges:  $Therapeutic Activity: 23-37 mins $Neuromuscular Re-education: 8-22 mins                     Magda Kiel, PT Acute Rehabilitation Services Office:[phone] 04/05/2022    Reginia Naas 04/05/2022, 6:06 PM

## 2022-04-05 NOTE — Progress Notes (Signed)
STROKE TEAM PROGRESS NOTE   INTERVAL HISTORY Patient evaluated at beside, no acute changes, still perseverative and unable to answer questions. Follows commands. Pending discharge to CIR, waiting on insurance auth process. Vitals remain stable, neurological exam unchanged. Her husband is at the bedside.   Vitals:   04/05/22 0500 04/05/22 0730 04/05/22 1118 04/05/22 1601  BP:  (!) 166/57 (!) 143/55 (!) 165/66  Pulse:  66 67 68  Resp:  15 16 (!) 21  Temp:  98.6 F (37 C) 99.1 F (37.3 C) 98.7 F (37.1 C)  TempSrc:  Oral Oral Oral  SpO2:  95% 94% 93%  Weight: 88.8 kg     Height:       CBC:  Recent Labs  Lab 04/02/22 0559 04/03/22 0234  WBC 7.2 7.8  HGB 12.8 12.9  HCT 39.6 38.8  MCV 85.7 85.3  PLT 250 454   Basic Metabolic Panel:  Recent Labs  Lab 04/01/22 1703 04/02/22 0559 04/03/22 0234  NA  --  140 143  K  --  3.6 4.0  CL  --  106 106  CO2  --  26 28  GLUCOSE  --  118* 89  BUN  --  27* 20  CREATININE  --  0.73 0.70  CALCIUM  --  8.8* 8.8*  MG 2.3 2.3  --   PHOS 2.8 3.2  --    Lipid Panel:  No results for input(s): "CHOL", "TRIG", "HDL", "CHOLHDL", "VLDL", "LDLCALC" in the last 168 hours.  HgbA1c:  No results for input(s): "HGBA1C" in the last 168 hours.  Urine Drug Screen: No results for input(s): "LABOPIA", "COCAINSCRNUR", "LABBENZ", "AMPHETMU", "THCU", "LABBARB" in the last 168 hours.  Alcohol Level  No results for input(s): "ETH" in the last 168 hours.   IMAGING past 24 hours No results found.  PHYSICAL EXAM  Physical Exam  Constitutional: Appears well-developed and well-nourished.   Cardiovascular: Normal rate and regular rhythm.  Respiratory: Effort normal, non-labored breathing   NEURO:  Mental Status: Awake, alert today Speech/Language: speech dysarthric with both receptive and expressive aphasia.  Can intermittently follow commands and repeat short phrases  Cranial Nerves:  II: PERRL. Does not blink to threat on the right III, IV, VI:  left gaze deviation, cannot cross midline  VII: slight right facial droop   VIII: hearing intact to voice. XII: tongue is midline without fasciculations. Motor: 5/5 strength to LUE and LLE, 0/5 to RUE and 2/5 to RLE  Sensation- Appears intact to light touch bilaterally.  Gait- deferred   ASSESSMENT/PLAN Elaine Ford is a 76 y.o. female with history of gallstones, HLD, HTN LBBB s/p pacemaker placement and hypotension, presenting to the ED via EMS with acute onset of right hemiplegia and aphasia presenting with via EMS after being found slumped towards the right on the couch by her husband. She received TNK and underwent a mechanical thrombectomy for a proximal left M1 occlusion. Repeat CT Head showed extensive stroke in the left MCA territory with cytotoxic edema and mild midline shift. Moved to step down 9/21. Repeat CT 9/22- No progression of large left MCA territory infarct with petechial and subarachnoid hemorrhage.  Stroke:  Left MCA infarct due to left M1 occlusion s/p TNK and IR with TICI3 complicated by small SAH, etiology unclear  Code Stroke CT head Hyperdensity in the left MCA near the bifurcation and in the proximal M2 branches CTA head & neck Occlusion of the left distal M1 with minimal flow in the proximal  left M2 branches and poor perfusion of the remainder of the left MCA territory. Multifocal stenosis in the bilateral PCAs, which is severe at the origin of one of the left P3 branches, moderate in the proximal right P1 and throughout the P2, and mild in the left P2. Poor perfusion of the right P3 segments. IR with TICI3 after 4 passes Post IR CT Small SAH and contrast pending x 2 Not able to have MRI due to pacer incompatible CT repeat 9/19 Increased edema in left MCA territory with no midline shift, likely small SAH in left sylvian fissure and left cerebral sulci 2D Echo 60-65%- normal LV function Pacemaker interrogation showed only 10 seconds of a flutter  LDL 104 HgbA1c  5.5 VTE prophylaxis - SCDs No antithrombotic prior to admission, now on aspirin 81 mg and plavix DAPTx 3 weeks and then aspirin alone Therapy recommendations: CIR on Monday  Disposition:  pending  Respiratory failure Intubated before procedure Remain intubated after procedure due to mental status Extubated 9/19 after CT repeat Mild respiratory distress with secretions, no gag or cough High risk for reintubation Close ICU monitoring  Paroxysmal brief Aflutter Pacemaker interrogation showed a 10 seconds of Aflutter Discussed with card EP, does not think enough evidence for anticoagulation Antiplatelet therapy for now Continue pacemaker monitoring  Hypertension Home meds:  Coreg '25mg'$  Stable BP goal < 160 Long-term BP goal normotensive  Hyperlipidemia LDL 104, goal < 70 Resume atorvastatin '40mg'$   Continue statin at discharge  Dysphagia Did not pass a swallow Placed core track, on tube feeding @ 45 Speech on board  Other Stroke Risk Factors Advanced Age >/= 56  Obesity, Body mass index is 33.6 kg/m., BMI >/= 30 associated with increased stroke risk, recommend weight loss, diet and exercise as appropriate   Other Active Problems LBBB on pacer  Hospital day # 8  Elaine Slates, MD PGY-1   I have personally obtained history,examined this patient, reviewed notes, independently viewed imaging studies, participated in medical decision making and plan of care.ROS completed by me personally and pertinent positives fully documented  I have made any additions or clarifications directly to the above note. Agree with note above.  Continue ongoing physical occupational therapy.  Medically stable to be transferred to inpatient rehab when bed available.  Antony Contras, MD Medical Director [facility] Pager: [phone] 04/05/2022 5:17 PM   To contact Stroke Continuity provider, please refer to [email or web address] After hours, contact General Neurology

## 2022-04-05 NOTE — Progress Notes (Signed)
Speech Language Pathology Treatment: Dysphagia;Cognitive-Linquistic  Patient Details Name: Elaine Ford MRN: [id number] DOB: 08-16-1945 Today's Date: 04/05/2022 Time: 5170-0174 SLP Time Calculation (min) (ACUTE ONLY): 15 min  Assessment / Plan / Recommendation Clinical Impression  Pt alert and able to follow commands, Speaking in short phrases in response to social language with mild errors sometimes impacting intelligibility. Pt expressing wants and needs more with paraphasic errors and telegraphic language. Revisions frequently needed. Pt is somewhat striderous this am. She has wet throat clearing. Husband reports this started last night and pt did not sleep well as a result. SLP offered small teaspoon of honey thick water x2 to elicit swallowing and hopefully clear throat a bit. Pt able to swallow, but grimacing was present. DId not proceed further with trials as pts work of breathing is different from Friday and somewhat effortful. Pt not expected to be more successful today than last week. Will await improvement for repeat MBS/FEES (would like to visualize airway).   HPI HPI: 9/18 admit for CVA.  CT of the head demonstrated hypodensity in the left MCA without hemorrhage.   Systemic TNK mechanical thrombectomy.  Post procedurally on vent to ICU. Postoperative CT did demonstrate small subarachnoid hemorrhage.Extubated 9/19. No MRI at this time.      SLP Plan  Continue with current plan of care      Recommendations for follow up therapy are one component of a multi-disciplinary discharge planning process, led by the attending physician.  Recommendations may be updated based on patient status, additional functional criteria and insurance authorization.    Recommendations  Diet recommendations: NPO                Oral Care Recommendations: Oral care BID Follow Up Recommendations: Acute inpatient rehab (3hours/day) Assistance recommended at discharge: Frequent or constant  Supervision/Assistance SLP Visit Diagnosis: Dysphagia, oropharyngeal phase (R13.12) Plan: Continue with current plan of care           Kirsi Hugh, Katherene Ponto  04/05/2022, 10:27 AM

## 2022-04-05 NOTE — Progress Notes (Signed)
Inpatient Rehabilitation Admissions Coordinator   I await insurance approval from [facility] for possible CIR admit.   Danne Baxter, RN, MSN Rehab Admissions Coordinator [phone] 04/05/2022 11:13 AM

## 2022-04-06 ENCOUNTER — Encounter (HOSPITAL_COMMUNITY): Payer: Self-pay | Admitting: Physical Medicine and Rehabilitation

## 2022-04-06 ENCOUNTER — Other Ambulatory Visit: Payer: Self-pay

## 2022-04-06 ENCOUNTER — Inpatient Hospital Stay (HOSPITAL_COMMUNITY)
Admission: RE | Admit: 2022-04-06 | Discharge: 2022-04-27 | DRG: 056 | Disposition: A | Discharge status: Home Health | Payer: Medicare HMO | Source: Intra-hospital | Attending: Physical Medicine & Rehabilitation | Admitting: Physical Medicine & Rehabilitation

## 2022-04-06 DIAGNOSIS — N281 Cyst of kidney, acquired: Secondary | ICD-10-CM | POA: Diagnosis not present

## 2022-04-06 DIAGNOSIS — I10 Essential (primary) hypertension: Secondary | ICD-10-CM | POA: Diagnosis present

## 2022-04-06 DIAGNOSIS — J811 Chronic pulmonary edema: Secondary | ICD-10-CM | POA: Diagnosis not present

## 2022-04-06 DIAGNOSIS — I4892 Unspecified atrial flutter: Secondary | ICD-10-CM | POA: Diagnosis not present

## 2022-04-06 DIAGNOSIS — I69398 Other sequelae of cerebral infarction: Secondary | ICD-10-CM

## 2022-04-06 DIAGNOSIS — R0989 Other specified symptoms and signs involving the circulatory and respiratory systems: Secondary | ICD-10-CM | POA: Diagnosis present

## 2022-04-06 DIAGNOSIS — M79602 Pain in left arm: Secondary | ICD-10-CM | POA: Diagnosis present

## 2022-04-06 DIAGNOSIS — E1165 Type 2 diabetes mellitus with hyperglycemia: Secondary | ICD-10-CM | POA: Diagnosis present

## 2022-04-06 DIAGNOSIS — R059 Cough, unspecified: Secondary | ICD-10-CM | POA: Diagnosis not present

## 2022-04-06 DIAGNOSIS — T80818A Extravasation of other vesicant agent, initial encounter: Secondary | ICD-10-CM | POA: Diagnosis not present

## 2022-04-06 DIAGNOSIS — I63512 Cerebral infarction due to unspecified occlusion or stenosis of left middle cerebral artery: Secondary | ICD-10-CM | POA: Diagnosis present

## 2022-04-06 DIAGNOSIS — Z823 Family history of stroke: Secondary | ICD-10-CM

## 2022-04-06 DIAGNOSIS — R32 Unspecified urinary incontinence: Secondary | ICD-10-CM | POA: Diagnosis present

## 2022-04-06 DIAGNOSIS — M79605 Pain in left leg: Secondary | ICD-10-CM | POA: Diagnosis not present

## 2022-04-06 DIAGNOSIS — N390 Urinary tract infection, site not specified: Secondary | ICD-10-CM | POA: Diagnosis not present

## 2022-04-06 DIAGNOSIS — I4719 Other supraventricular tachycardia: Secondary | ICD-10-CM | POA: Diagnosis not present

## 2022-04-06 DIAGNOSIS — L259 Unspecified contact dermatitis, unspecified cause: Secondary | ICD-10-CM | POA: Diagnosis not present

## 2022-04-06 DIAGNOSIS — Z8249 Family history of ischemic heart disease and other diseases of the circulatory system: Secondary | ICD-10-CM

## 2022-04-06 DIAGNOSIS — R739 Hyperglycemia, unspecified: Secondary | ICD-10-CM

## 2022-04-06 DIAGNOSIS — I69392 Facial weakness following cerebral infarction: Secondary | ICD-10-CM | POA: Diagnosis not present

## 2022-04-06 DIAGNOSIS — Z886 Allergy status to analgesic agent status: Secondary | ICD-10-CM

## 2022-04-06 DIAGNOSIS — I69322 Dysarthria following cerebral infarction: Secondary | ICD-10-CM | POA: Diagnosis not present

## 2022-04-06 DIAGNOSIS — E876 Hypokalemia: Secondary | ICD-10-CM

## 2022-04-06 DIAGNOSIS — R21 Rash and other nonspecific skin eruption: Secondary | ICD-10-CM | POA: Diagnosis not present

## 2022-04-06 DIAGNOSIS — L218 Other seborrheic dermatitis: Secondary | ICD-10-CM | POA: Diagnosis not present

## 2022-04-06 DIAGNOSIS — I69020 Aphasia following nontraumatic subarachnoid hemorrhage: Secondary | ICD-10-CM

## 2022-04-06 DIAGNOSIS — R482 Apraxia: Secondary | ICD-10-CM | POA: Diagnosis present

## 2022-04-06 DIAGNOSIS — I447 Left bundle-branch block, unspecified: Secondary | ICD-10-CM | POA: Diagnosis present

## 2022-04-06 DIAGNOSIS — Z885 Allergy status to narcotic agent status: Secondary | ICD-10-CM

## 2022-04-06 DIAGNOSIS — K5900 Constipation, unspecified: Secondary | ICD-10-CM | POA: Diagnosis not present

## 2022-04-06 DIAGNOSIS — I69391 Dysphagia following cerebral infarction: Secondary | ICD-10-CM

## 2022-04-06 DIAGNOSIS — Z6832 Body mass index (BMI) 32.0-32.9, adult: Secondary | ICD-10-CM

## 2022-04-06 DIAGNOSIS — D649 Anemia, unspecified: Secondary | ICD-10-CM | POA: Diagnosis not present

## 2022-04-06 DIAGNOSIS — E785 Hyperlipidemia, unspecified: Secondary | ICD-10-CM | POA: Diagnosis present

## 2022-04-06 DIAGNOSIS — D62 Acute posthemorrhagic anemia: Secondary | ICD-10-CM | POA: Diagnosis present

## 2022-04-06 DIAGNOSIS — Z950 Presence of cardiac pacemaker: Secondary | ICD-10-CM | POA: Diagnosis not present

## 2022-04-06 DIAGNOSIS — R1312 Dysphagia, oropharyngeal phase: Secondary | ICD-10-CM | POA: Diagnosis present

## 2022-04-06 DIAGNOSIS — E669 Obesity, unspecified: Secondary | ICD-10-CM | POA: Diagnosis not present

## 2022-04-06 DIAGNOSIS — I609 Nontraumatic subarachnoid hemorrhage, unspecified: Secondary | ICD-10-CM | POA: Diagnosis present

## 2022-04-06 DIAGNOSIS — D72829 Elevated white blood cell count, unspecified: Secondary | ICD-10-CM | POA: Diagnosis not present

## 2022-04-06 DIAGNOSIS — I63412 Cerebral infarction due to embolism of left middle cerebral artery: Secondary | ICD-10-CM | POA: Diagnosis not present

## 2022-04-06 DIAGNOSIS — I69320 Aphasia following cerebral infarction: Secondary | ICD-10-CM

## 2022-04-06 DIAGNOSIS — M79604 Pain in right leg: Secondary | ICD-10-CM | POA: Diagnosis not present

## 2022-04-06 DIAGNOSIS — K649 Unspecified hemorrhoids: Secondary | ICD-10-CM | POA: Diagnosis present

## 2022-04-06 DIAGNOSIS — H5789 Other specified disorders of eye and adnexa: Secondary | ICD-10-CM | POA: Diagnosis not present

## 2022-04-06 DIAGNOSIS — M79644 Pain in right finger(s): Secondary | ICD-10-CM | POA: Diagnosis not present

## 2022-04-06 DIAGNOSIS — I69351 Hemiplegia and hemiparesis following cerebral infarction affecting right dominant side: Principal | ICD-10-CM

## 2022-04-06 DIAGNOSIS — R269 Unspecified abnormalities of gait and mobility: Secondary | ICD-10-CM | POA: Diagnosis not present

## 2022-04-06 DIAGNOSIS — Z888 Allergy status to other drugs, medicaments and biological substances status: Secondary | ICD-10-CM

## 2022-04-06 DIAGNOSIS — M25531 Pain in right wrist: Secondary | ICD-10-CM | POA: Diagnosis not present

## 2022-04-06 DIAGNOSIS — R1310 Dysphagia, unspecified: Secondary | ICD-10-CM | POA: Diagnosis not present

## 2022-04-06 DIAGNOSIS — R310 Gross hematuria: Secondary | ICD-10-CM | POA: Diagnosis not present

## 2022-04-06 LAB — GLUCOSE, CAPILLARY
Glucose-Capillary: 121 mg/dL — ABNORMAL HIGH (ref 70–99)
Glucose-Capillary: 131 mg/dL — ABNORMAL HIGH (ref 70–99)
Glucose-Capillary: 136 mg/dL — ABNORMAL HIGH (ref 70–99)
Glucose-Capillary: 150 mg/dL — ABNORMAL HIGH (ref 70–99)
Glucose-Capillary: 162 mg/dL — ABNORMAL HIGH (ref 70–99)
Glucose-Capillary: 179 mg/dL — ABNORMAL HIGH (ref 70–99)

## 2022-04-06 MED ORDER — MELATONIN 3 MG PO TABS
3.0000 mg | ORAL_TABLET | Freq: Every evening | ORAL | Status: DC | PRN
  Filled 2022-04-06: qty 1

## 2022-04-06 MED ORDER — PROSOURCE TF20 ENFIT COMPATIBL EN LIQD: 60.0000 mL | Freq: Every day | ENTERAL | Status: DC

## 2022-04-06 MED ORDER — INSULIN ASPART 100 UNIT/ML IJ SOLN
0.0000 [IU] | INTRAMUSCULAR | Status: DC
  Administered 2022-04-06 – 2022-04-07 (×4): 1 [IU] via SUBCUTANEOUS
  Administered 2022-04-07 – 2022-04-09 (×6): 2 [IU] via SUBCUTANEOUS
  Administered 2022-04-09: 1 [IU] via SUBCUTANEOUS
  Administered 2022-04-09: 2 [IU] via SUBCUTANEOUS
  Administered 2022-04-09 – 2022-04-10 (×7): 1 [IU] via SUBCUTANEOUS
  Administered 2022-04-11: 2 [IU] via SUBCUTANEOUS
  Administered 2022-04-11 – 2022-04-12 (×6): 1 [IU] via SUBCUTANEOUS

## 2022-04-06 MED ORDER — ATORVASTATIN CALCIUM 40 MG PO TABS
40.0000 mg | ORAL_TABLET | Freq: Every day | ORAL | Status: DC
  Administered 2022-04-07 – 2022-04-14 (×8): 40 mg
  Filled 2022-04-06 (×8): qty 1

## 2022-04-06 MED ORDER — LABETALOL HCL 5 MG/ML IV SOLN: 20.0000 mg | INTRAVENOUS | Status: DC | PRN

## 2022-04-06 MED ORDER — ASPIRIN 81 MG PO CHEW: 81.0000 mg | CHEWABLE_TABLET | Freq: Every day | ORAL | Status: DC

## 2022-04-06 MED ORDER — HYDRALAZINE HCL 20 MG/ML IJ SOLN: 10.0000 mg | INTRAMUSCULAR | Status: DC | PRN

## 2022-04-06 MED ORDER — HYDRALAZINE HCL 10 MG PO TABS
10.0000 mg | ORAL_TABLET | Freq: Four times a day (QID) | ORAL | Status: DC | PRN
  Administered 2022-04-16 – 2022-04-20 (×5): 10 mg via ORAL
  Filled 2022-04-06 (×8): qty 1

## 2022-04-06 MED ORDER — OSMOLITE 1.5 CAL PO LIQD
1000.0000 mL | ORAL | Status: DC
  Administered 2022-04-06 – 2022-04-07 (×2): 1000 mL
  Filled 2022-04-06 (×3): qty 1000

## 2022-04-06 MED ORDER — CLOPIDOGREL BISULFATE 75 MG PO TABS: 75.0000 mg | ORAL_TABLET | Freq: Every day | ORAL | Status: DC

## 2022-04-06 MED ORDER — SENNOSIDES-DOCUSATE SODIUM 8.6-50 MG PO TABS
1.0000 | ORAL_TABLET | Freq: Every evening | ORAL | Status: DC | PRN
  Administered 2022-04-06: 1
  Filled 2022-04-06: qty 1

## 2022-04-06 MED ORDER — PANTOPRAZOLE 2 MG/ML SUSPENSION
40.0000 mg | Freq: Every day | ORAL | Status: DC
  Administered 2022-04-06 – 2022-04-13 (×8): 40 mg
  Filled 2022-04-06 (×9): qty 20

## 2022-04-06 MED ORDER — OSMOLITE 1.5 CAL PO LIQD: 1000.0000 mL | ORAL | 0 refills | Status: DC

## 2022-04-06 MED ORDER — GUAIFENESIN-DM 100-10 MG/5ML PO SYRP: 5.0000 mL | ORAL_SOLUTION | Freq: Four times a day (QID) | ORAL | Status: DC | PRN

## 2022-04-06 MED ORDER — TRAMADOL HCL 50 MG PO TABS: 50.0000 mg | ORAL_TABLET | Freq: Four times a day (QID) | ORAL | Status: DC | PRN

## 2022-04-06 MED ORDER — SENNOSIDES-DOCUSATE SODIUM 8.6-50 MG PO TABS: 1.0000 | ORAL_TABLET | Freq: Every evening | ORAL | Status: DC | PRN

## 2022-04-06 MED ORDER — PHENOL 1.4 % MT LIQD
2.0000 | OROMUCOSAL | Status: DC | PRN
  Administered 2022-04-06: 2 via OROMUCOSAL

## 2022-04-06 MED ORDER — PANTOPRAZOLE SODIUM 40 MG PO PACK: 40.0000 mg | PACK | Freq: Every day | ORAL | Status: DC

## 2022-04-06 MED ORDER — ORAL CARE MOUTH RINSE: 15.0000 mL | OROMUCOSAL | Status: DC | PRN

## 2022-04-06 MED ORDER — PROCHLORPERAZINE MALEATE 5 MG PO TABS: 5.0000 mg | ORAL_TABLET | Freq: Four times a day (QID) | ORAL | Status: DC | PRN

## 2022-04-06 MED ORDER — DOCUSATE SODIUM 50 MG/5ML PO LIQD
100.0000 mg | Freq: Two times a day (BID) | ORAL | Status: DC
  Administered 2022-04-06 – 2022-04-12 (×12): 100 mg
  Filled 2022-04-06 (×12): qty 10

## 2022-04-06 MED ORDER — ACETAMINOPHEN 650 MG RE SUPP: 650.0000 mg | Freq: Four times a day (QID) | RECTAL | 0 refills | Status: DC | PRN

## 2022-04-06 MED ORDER — BISACODYL 10 MG RE SUPP: 10.0000 mg | Freq: Every day | RECTAL | Status: DC | PRN

## 2022-04-06 MED ORDER — CARVEDILOL 25 MG PO TABS: 25.0000 mg | ORAL_TABLET | Freq: Two times a day (BID) | ORAL | Status: DC

## 2022-04-06 MED ORDER — PROCHLORPERAZINE EDISYLATE 10 MG/2ML IJ SOLN: 5.0000 mg | Freq: Four times a day (QID) | INTRAMUSCULAR | Status: DC | PRN

## 2022-04-06 MED ORDER — PROCHLORPERAZINE 25 MG RE SUPP: 12.5000 mg | Freq: Four times a day (QID) | RECTAL | Status: DC | PRN

## 2022-04-06 MED ORDER — PROSOURCE TF20 ENFIT COMPATIBL EN LIQD
60.0000 mL | Freq: Every day | ENTERAL | Status: DC
  Administered 2022-04-07 – 2022-04-13 (×7): 60 mL
  Filled 2022-04-06 (×7): qty 60

## 2022-04-06 MED ORDER — ALUM & MAG HYDROXIDE-SIMETH 200-200-20 MG/5ML PO SUSP: 30.0000 mL | ORAL | Status: DC | PRN

## 2022-04-06 MED ORDER — DOCUSATE SODIUM 50 MG/5ML PO LIQD: 100.0000 mg | Freq: Two times a day (BID) | ORAL | 0 refills | Status: DC

## 2022-04-06 MED ORDER — TRAMADOL HCL 50 MG PO TABS
50.0000 mg | ORAL_TABLET | Freq: Four times a day (QID) | ORAL | Status: DC | PRN
  Administered 2022-04-08 – 2022-04-13 (×4): 50 mg
  Filled 2022-04-06 (×5): qty 1

## 2022-04-06 MED ORDER — ACETAMINOPHEN 325 MG PO TABS
325.0000 mg | ORAL_TABLET | ORAL | Status: DC | PRN
  Administered 2022-04-08 – 2022-04-11 (×3): 650 mg
  Filled 2022-04-06 (×3): qty 2

## 2022-04-06 MED ORDER — ASPIRIN 81 MG PO CHEW
81.0000 mg | CHEWABLE_TABLET | Freq: Every day | ORAL | Status: DC
  Administered 2022-04-07 – 2022-04-14 (×8): 81 mg
  Filled 2022-04-06 (×8): qty 1

## 2022-04-06 MED ORDER — ATORVASTATIN CALCIUM 40 MG PO TABS: 40.0000 mg | ORAL_TABLET | Freq: Every day | ORAL | Status: DC

## 2022-04-06 MED ORDER — CLOPIDOGREL BISULFATE 75 MG PO TABS
75.0000 mg | ORAL_TABLET | Freq: Every day | ORAL | Status: DC
  Administered 2022-04-07 – 2022-04-14 (×8): 75 mg
  Filled 2022-04-06 (×8): qty 1

## 2022-04-06 MED ORDER — ORAL CARE MOUTH RINSE
15.0000 mL | OROMUCOSAL | Status: DC
  Administered 2022-04-06 – 2022-04-27 (×72): 15 mL via OROMUCOSAL

## 2022-04-06 MED ORDER — FLEET ENEMA 7-19 GM/118ML RE ENEM: 1.0000 | ENEMA | Freq: Once | RECTAL | Status: DC | PRN

## 2022-04-06 MED ORDER — CARVEDILOL 25 MG PO TABS
25.0000 mg | ORAL_TABLET | Freq: Two times a day (BID) | ORAL | Status: DC
  Administered 2022-04-06 – 2022-04-14 (×17): 25 mg
  Filled 2022-04-06 (×17): qty 1

## 2022-04-06 MED ORDER — DIPHENHYDRAMINE HCL 12.5 MG/5ML PO ELIX: 12.5000 mg | ORAL_SOLUTION | Freq: Four times a day (QID) | ORAL | Status: DC | PRN

## 2022-04-06 NOTE — Progress Notes (Signed)
Inpatient Rehabilitation Admissions Coordinator   I have insurance approval and CIR bed to admit her to today. I met at bedside with patient , spouse and son, they are in agreement. I contacted Dr Leonie Man, acute team and TOC. I will make the arrangements to admit today.  Danne Baxter, RN, MSN Rehab Admissions Coordinator [phone] 04/06/2022 10:49 AM

## 2022-04-06 NOTE — H&P (Signed)
Physical Medicine and Rehabilitation Admission H&P        Chief Complaint  Patient presents with   Functional deficits due to stroke.       HPI:  Elaine Ford is a 76 year old female with history of HTN, gestational diabetes, hypotension, LBBB s/p PPM who was admitted on 03/28/22 after found slumped on the couch with right sided weakness and inability to speak. She was noted to have right facial droop, left gaze deviation, right hemiplegia with severe dysarthria w/occasional phrase.  CT head showed hyperdensity L-MCA near bifurcation and proximal M2 branches. CTA/perfusion brain showed occlusion of left distal M1 with minimal flow in  proximal M2 branches and poor perfusion of L-MCA, multifocal intracranial stenosis--stenosis B-PCA severe at L-P3 branches and moderate in  Right P1 and P2 with poor perfusion of R-P3 segments and mild stenosis M1, M2, L-V4 and right V1 segment. She received TNK and underwent cerebral angio with mechanical thrombectomy of Left M1/MCA occlusion with residual stenosis proximal left M2/MCA. Follow up CT head showed small to moderate SAH/contrast extravasation.  Post op noted to have episodes of atrial tachycardia. PPM interrogation showed 10 seconds of A flutter but no evidence for AC per EP input.    She tolerated extubation without difficulty and made NPO due to severe oropharyngeal dysphagia with sensed aspiration as well as swelling of epiglottis. 2D echo showed EF 60-65% with moderate LVH and paradoxical septal motion c/w LBBB.  Repeat CT 9/18 head showed increase in edema L-MCA territory affecting L-temporal and basal ganglia with increase in mass effect. She was monitored in ICU with repeat CT head 9/22 showing conspicuous large L-MCA infarct with high density vessels and petechial hemorrhage and unchanged small volume SAH along. Plavix added 09/22 to ASA for stroke due to unknown source with recommendations for DAPT X 3 weeks followed by ASA alone.  She was kept NPO  with tube feeds for nutritional support. Verbal output improving but she continues to be limited by  right sided weakness with strong right lean, right inattention, inability to follow one step commands consistently as well as perseverative paraphrasia's. PT/OT has been working with patient and CIR recommended due to functional decline.     Aphasic limited expression in both Spanish and English   Review of Systems  Unable to perform ROS: Language            Past Medical History:  Diagnosis Date   Back pain     Broken arm      age 88 years old broke right arm after a fall   Gall bladder stones     Gestational diabetes     Hemorrhoid     Hx of cardiac cath      [facility] 07/24/14 at [facility] - no significant CAD   Hx of cardiovascular stress test      Myoview 11/14:  No ischemia   Hx of echocardiogram      Echo 07/23/14 - at [facility]:  EF 65-70%, impaired relaxation, mild LAE, mild MR, mild AI, mild elevated PASP (44.2 mmHg)   Hyperlipidemia     Hypertension     LBBB (left bundle branch block)     Low blood pressure     Nerve damage      right arm   Pain      Left upper chest s/p pacer maker insertion site 07/2014   S/P cardiac pacemaker procedure 07/2014    [employer] - Dr. Lovena Le   Stroke (  [place])      patient was unaware noted prior to pacermaker placement unknown when it occurred           Past Surgical History:  Procedure Laterality Date   COLONOSCOPY       DILATATION & CURETTAGE/HYSTEROSCOPY WITH MYOSURE N/A 02/29/2016    Procedure: DILATATION & CURETTAGE/HYSTEROSCOPY WITH MYOSURE;  Surgeon: [name], DO;  Location: [place] [facility];  Service: Gynecology;  Laterality: N/A;  patient has a pacemaker    GALLBLADDER SURGERY       IR 3D INDEPENDENT WKST   03/28/2022   IR PERCUTANEOUS ART THROMBECTOMY/INFUSION INTRACRANIAL INC DIAG ANGIO   03/28/2022   IR US GUIDE VASC ACCESS RIGHT   03/28/2022   LAPAROSCOPIC BILATERAL SALPINGO OOPHERECTOMY   04/20/2016    Procedure: LAPAROSCOPIC BILATERAL SALPINGO  OOPHORECTOMY;  Surgeon: [name], DO;  Location: [place] [facility];  Service: Gynecology;;   LEAD REVISION N/A 07/25/2014    Procedure: LEAD REVISION;  Surgeon: Deboraha Sprang, MD;  Location: [facility] C[facility] LAB;  Service: Cardiovascular;  Laterality: N/A;   PERMANENT PACEMAKER INSERTION N/A 07/24/2014    Procedure: PERMANENT PACEMAKER INSERTION;  Surgeon: [name], MD;  Location: [facility] CATH LAB;  Service: Cardiovascular;  Laterality: N/A;   RADIOLOGY WITH ANESTHESIA N/A 03/28/2022    Procedure: IR WITH ANESTHESIA;  Surgeon: Radiologist, Medication, MD;  Location: [place];  Service: Radiology;  Laterality: N/A;   VAGINAL HYSTERECTOMY   04/20/2016    Procedure: HYSTERECTOMY VAGINAL;  Surgeon: [name], DO;  Location: [place] [facility];  Service: Gynecology;;           Family History  Problem Relation Age of Onset   Heart attack Father     Stroke Father     Heart disease Brother        Social History:  reports that she has never smoked. She has never used smokeless tobacco. She reports that she does not drink alcohol and does not use drugs.          Allergies  Allergen Reactions   Ace Inhibitors Cough   Codeine Other (See Comments)      Reaction:  Unknown    Hydrocodone-Acetaminophen Other (See Comments)      Reaction:  Unknown    Lisinopril-Hydrochlorothiazide Cough   Oxycodone-Acetaminophen Nausea And Vomiting            Medications Prior to Admission  Medication Sig Dispense Refill   Calcium Carbonate-Vitamin D 600-400 MG-UNIT tablet Take 1 tablet by mouth daily.       carvedilol (COREG) 25 MG tablet TAKE 1 TABLET (25 MG TOTAL) BY MOUTH 2 (TWO) TIMES DAILY. 180 tablet 3   ibuprofen (ADVIL,MOTRIN) 600 MG tablet Take 1 tablet (600 mg total) by mouth every 6 (six) hours as needed for moderate pain. 30 tablet 0   Multiple Vitamin (MULTIVITAMIN WITH MINERALS) TABS tablet Take 1 tablet by mouth daily.       nitroGLYCERIN (NITROSTAT) 0.4 MG SL tablet Place 1 tablet (0.4 mg total) under the tongue every  5 (five) minutes as needed for chest pain. 25 tablet 6   VITAMIN E PO Take 1 capsule by mouth daily.        traMADol (ULTRAM) 50 MG tablet Take 1 tablet (50 mg total) by mouth every 6 (six) hours as needed. (Patient not taking: Reported on 03/28/2022) 15 tablet 0          Home: Home Living Family/patient expects to be discharged to:: Private residence Living Arrangements: Spouse/significant  other Available Help at Discharge: Family, Available 24 hours/day Type of Home: House Home Access: Stairs to enter [employer] of Steps: 1 Entrance Stairs-Rails: None Home Layout: Two level, 1/2 bath on main level, Bed/bath upstairs, Able to live on main level with bedroom/bathroom Alternate Level Stairs-Number of Steps: 13 Alternate Level Stairs-Rails: Left Bathroom Shower/Tub: Chiropodist: Handicapped height Bathroom Accessibility: Yes Home Equipment: Grab bars - toilet, Grab bars - tub/shower, Cane - single point, Shower seat Additional Comments: Pt was driving as well  Lives With: Spouse   Functional History: Prior Function Prior Level of Function : Independent/Modified Independent, Driving Mobility Comments: No falls. No AD ADLs Comments: Independent with ADLs   Functional Status:  Mobility: Bed Mobility Overal bed mobility: Needs Assistance Bed Mobility: Rolling, Sidelying to Sit Rolling: Mod assist Sidelying to sit: Mod assist, Max assist Supine to sit: Mod assist, HOB elevated Sit to supine: Max assist, HOB elevated General bed mobility comments: rolled to R with cues and assist for reaching to rail, assist to lift trunk and for R LE off bed and due to R lateral lean Pt likes to have hips very close to edge prior to trunk elevation Transfers Overall transfer level: Needs assistance Equipment used: 2 person hand held assist Transfers: Sit to/from Stand, Bed to chair/wheelchair/BSC Sit to Stand: Mod assist, +2 physical assistance, +2  safety/equipment Bed to/from chair/wheelchair/BSC transfer type:: Step pivot Stand pivot transfers: Mod assist, +2 safety/equipment Step pivot transfers: Mod assist, +2 physical assistance, +2 safety/equipment General transfer comment: R knee improved from previous session, as complexity of task increases R knee block required; cue to pt for visual vertical and correcting R lateral head lean with improved weight shift to L; step pivot to recliner with assist for moving R foot and blocking knee for safety, multimodal cues for sequencing and safety, assist with weight shift Ambulation/Gait Ambulation/Gait assistance: Mod assist Gait Distance (Feet): 2 Feet Assistive device: 1 person hand held assist Gait Pattern/deviations: Step-through pattern, Decreased step length - right, Decreased stride length, Decreased dorsiflexion - right, Decreased weight shift to right, Decreased weight shift to left General Gait Details: ModA to steady and cue lateral weight shifting to direct pt to step to L recliner > bed. Good initiation by pt to take steps with no knee buckling noted. Gait velocity: reduced Gait velocity interpretation: <1.31 ft/sec, indicative of household ambulator Pre-gait activities: MaxA for lateral weight shifting and cuing pt to lift contralateral foot while standing at EOB with UE support on therapist anterior to her. Pt with strong R lateral lean   ADL: ADL Overall ADL's : Needs assistance/impaired Grooming: Moderate assistance, Standing, Wash/dry face, Brushing hair Grooming Details (indicate cue type and reason): Pt required mod A +2 for standing balance at sink, utilizing LUE to perform grooming tasks, R hand placed on sink surface for increased input through WB, L lateral lean in standing Pt unable to self correct. Upper Body Bathing: Moderate assistance, Sitting Upper Body Bathing Details (indicate cue type and reason): EOB unsupported Lower Body Bathing: Maximal assistance, +2 for  safety/equipment, Sit to/from stand Lower Body Bathing Details (indicate cue type and reason): simulated Upper Body Dressing : Moderate assistance, Sitting Upper Body Dressing Details (indicate cue type and reason): to don second gown as robe Lower Body Dressing: Maximal assistance, +2 for safety/equipment, Sit to/from stand Lower Body Dressing Details (indicate cue type and reason): simulated Toilet Transfer: Moderate assistance, +2 for physical assistance, +2 for safety/equipment, Stand-pivot, BSC/3in1 Toilet Transfer Details (  indicate cue type and reason): simulated through recliner transfer [place] and Hygiene: Maximal assistance, +2 for physical assistance, +2 for safety/equipment, Sit to/from stand Toileting - Clothing Manipulation Details (indicate cue type and reason): hygiene in standing after toileting Functional mobility during ADLs: Maximal assistance, +2 for physical assistance, +2 for safety/equipment General ADL Comments: as complexity of task increases, Pt cannot multitask   Cognition: Cognition Overall Cognitive Status: Impaired/Different from baseline Arousal/Alertness: Awake/alert Orientation Level: Oriented to person, Disoriented to place, Disoriented to time, Disoriented to situation Attention: Focused, Sustained Focused Attention: Appears intact Sustained Attention: Appears intact Cognition Arousal/Alertness: Awake/alert Behavior During Therapy: Flat affect Overall Cognitive Status: Impaired/Different from baseline Area of Impairment: Attention, Following commands, Safety/judgement, Problem solving, Awareness Current Attention Level: Sustained Following Commands: Follows one step commands inconsistently, Follows one step commands with increased time (66% of the time) Safety/Judgement: Decreased awareness of safety, Decreased awareness of deficits Awareness: Intellectual Problem Solving: Slow processing, Difficulty sequencing, Requires  verbal cues, Requires tactile cues General Comments: Pt speaking both english and spanish. Seems to follow directions better in spanish. some R inattention will attend with multimodal cues. With increased complexity of tasks required, attention decreases Difficult to assess due to: Impaired communication, Non-English speaking (husband acting as Optometrist)     Blood pressure (!) 150/59, pulse 67, temperature 98.8 F (37.1 C), temperature source Oral, resp. rate 16, height '5\' 4"'$  (1.626 m), weight 88.8 kg, SpO2 96 %. Physical Exam Vitals and nursing note reviewed.  Constitutional:      Comments: Up in chair with cortak in nares. Gurgling/sonorous upper airway sounds noted.   Neurological:     Mental Status: She is alert.     Comments: Right inattention with decrease in EOMI to the right. Right facial weakness with wet/congested/sonorous upper airway sounds.  Tends to keep mouth open with reports of sore throat. Receptive> expressive deficits with delay in processing. She was able to initiate output with max cues but noted to have perseverative echolaly. Occasional social utterance. Unable to point or state her or recognize her son's name. Able to follow simple motor commands with visual and tactile cues.     General: No acute distress Mood and affect are appropriate Heart: Regular rate and rhythm no rubs murmurs or extra sounds Lungs: upper airway sounds o/w Clear to auscultation, breathing unlabored, no rales or wheezes Abdomen: Positive bowel sounds, soft nontender to palpation, nondistended Extremities: No clubbing, cyanosis, or edema Skin: No evidence of breakdown, no evidence of rash Neurologic: Cranial nerves II through XII intact, motor strength is 5/5 in left deltoid, bicep, tricep, grip, hip flexor, knee extensors, ankle dorsiflexor and plantar flexor Trace finger flexin RUE o/w 0/5 3- Right HF, KE, 2- ankle PT/DF Sensory exam reduced light touch LEFT upper and lower  extremities  Musculoskeletal: no pain with  range of motion in all 4 extremities. No joint swelling      Lab Results Last 48 Hours        Results for orders placed or performed during the hospital encounter of 03/28/22 (from the past 48 hour(s))  Glucose, capillary     Status: Abnormal    Collection Time: 04/04/22  4:22 PM  Result Value Ref Range    Glucose-Capillary 133 (H) 70 - 99 mg/dL      Comment: Glucose reference range applies only to samples taken after fasting for at least 8 hours.  Glucose, capillary     Status: Abnormal    Collection Time: 04/04/22  7:29  PM  Result Value Ref Range    Glucose-Capillary 141 (H) 70 - 99 mg/dL      Comment: Glucose reference range applies only to samples taken after fasting for at least 8 hours.  Glucose, capillary     Status: Abnormal    Collection Time: 04/04/22 11:36 PM  Result Value Ref Range    Glucose-Capillary 115 (H) 70 - 99 mg/dL      Comment: Glucose reference range applies only to samples taken after fasting for at least 8 hours.  Glucose, capillary     Status: Abnormal    Collection Time: 04/05/22  3:32 AM  Result Value Ref Range    Glucose-Capillary 143 (H) 70 - 99 mg/dL      Comment: Glucose reference range applies only to samples taken after fasting for at least 8 hours.  Glucose, capillary     Status: Abnormal    Collection Time: 04/05/22  8:04 AM  Result Value Ref Range    Glucose-Capillary 126 (H) 70 - 99 mg/dL      Comment: Glucose reference range applies only to samples taken after fasting for at least 8 hours.  Glucose, capillary     Status: Abnormal    Collection Time: 04/05/22 11:17 AM  Result Value Ref Range    Glucose-Capillary 128 (H) 70 - 99 mg/dL      Comment: Glucose reference range applies only to samples taken after fasting for at least 8 hours.  Glucose, capillary     Status: Abnormal    Collection Time: 04/05/22  3:56 PM  Result Value Ref Range    Glucose-Capillary 143 (H) 70 - 99 mg/dL      Comment:  Glucose reference range applies only to samples taken after fasting for at least 8 hours.  Glucose, capillary     Status: Abnormal    Collection Time: 04/05/22  8:14 PM  Result Value Ref Range    Glucose-Capillary 127 (H) 70 - 99 mg/dL      Comment: Glucose reference range applies only to samples taken after fasting for at least 8 hours.  Glucose, capillary     Status: Abnormal    Collection Time: 04/05/22 11:30 PM  Result Value Ref Range    Glucose-Capillary 121 (H) 70 - 99 mg/dL      Comment: Glucose reference range applies only to samples taken after fasting for at least 8 hours.  Glucose, capillary     Status: Abnormal    Collection Time: 04/06/22  3:44 AM  Result Value Ref Range    Glucose-Capillary 162 (H) 70 - 99 mg/dL      Comment: Glucose reference range applies only to samples taken after fasting for at least 8 hours.  Glucose, capillary     Status: Abnormal    Collection Time: 04/06/22  8:07 AM  Result Value Ref Range    Glucose-Capillary 179 (H) 70 - 99 mg/dL      Comment: Glucose reference range applies only to samples taken after fasting for at least 8 hours.  Glucose, capillary     Status: Abnormal    Collection Time: 04/06/22 11:40 AM  Result Value Ref Range    Glucose-Capillary 131 (H) 70 - 99 mg/dL      Comment: Glucose reference range applies only to samples taken after fasting for at least 8 hours.      Imaging Results (Last 48 hours)  No results found.         Blood pressure (!) 150/59,  pulse 67, temperature 98.8 F (37.1 C), temperature source Oral, resp. rate 16, height '5\' 4"'$  (1.626 m), weight 88.8 kg, SpO2 96 %.   Medical Problem List and Plan: 1. Functional deficits secondary to Left MCA infarct             -patient may  shower             -ELOS/Goals: 14-16d MinA 2.  Antithrombotics: -DVT/anticoagulation:  Mechanical: Sequential compression devices, below knee Bilateral lower extremities             -antiplatelet therapy: DAPT X 3 weeks followed by  ASA alone.  3. Pain Management:  Tylenol prn.  4. Mood/Behavior/Sleep: LCSW to follow for evaluation and support.              -antipsychotic agents: N/A 5. Neuropsych/cognition: This patient is not fully capable of making decisions on her own behalf. 6. Skin/Wound Care: Routine pressure relief measures.  7. Fluids/Electrolytes/Nutrition: Monitor I/O. Check CMET in am. 8.  HTN: Monitor BP TID--continue coreg with BP goal <160. 9. Dysphagia: Continue NPO with tube feeds for nutritional support.  10. Hyperglycemia: Hgb A1C- 11 L-BBB s/p PPM: Brief episode of A flutter. Continue to monitor. 12. Hyperlipidemia: Continue Lipitor 13.  ABLA: Hgb down from 15.1-->12.9. will monitor for signs of bleeding.              --Recheck CBC in am.      Bary Leriche, PA-C 04/06/2022  "I have personally performed a face to face diagnostic evaluation of this patient.  Additionally, I have reviewed and concur with the physician assistant's documentation above." Charlett Blake M.D. [facility] Fellow Am Acad of Phys Med and Rehab Diplomate Am Board of Electrodiagnostic Med Fellow Am Board of Interventional Pain

## 2022-04-06 NOTE — Progress Notes (Signed)
Speech Language Pathology Treatment: Dysphagia;Cognitive-Linquistic  Patient Details Name: Elaine Ford MRN: [id number] DOB: [birth year] Today's Date: 04/06/2022 Time: 1040-1107 SLP Time Calculation (min) (ACUTE ONLY): 27 min  Assessment / Plan / Recommendation Clinical Impression  Pt up in chair, breathing stable today but pt still with mild grimacing when swallowing. SLP offered ice chip trials; pt needed verbal cues to sustain attention to oral manipulation and eventually swallow. Significant anterior spillage on the right. Pt could be ready for repeat MBS but given d/c to AIR today will defer to therapists at that venue. Pt able to attend to two photos of her family, needed model to name children, but then able to say three complete sentences to describe her granddaughter. Further open ended expressive attempts deteriorated into grammatical, but paraphasic meaningless conversation. Attention drifted at times and pt needed cues for awareness of unintelligible speech. Pt unable to choose appropriate adjectives to describe a picture of an animal x3. Pt will benefit from AIR at f/u.    HPI HPI: 9/18 admit for CVA.  CT of the head demonstrated hypodensity in the left MCA without hemorrhage.   Systemic TNK mechanical thrombectomy.  Post procedurally on vent to ICU. Postoperative CT did demonstrate small subarachnoid hemorrhage.Extubated 9/19. No MRI at this time.      SLP Plan  Continue with current plan of care      Recommendations for follow up therapy are one component of a multi-disciplinary discharge planning process, led by the attending physician.  Recommendations may be updated based on patient status, additional functional criteria and insurance authorization.    Recommendations                   Follow Up Recommendations: Acute inpatient rehab (3hours/day) Assistance recommended at discharge: Frequent or constant Supervision/Assistance SLP Visit Diagnosis: Dysphagia,  oropharyngeal phase (R13.12) Plan: Continue with current plan of care           Alexiah Koroma, Katherene Ponto  04/06/2022, 11:58 AM

## 2022-04-06 NOTE — Progress Notes (Signed)
Occupational Therapy Treatment Patient Details Name: Elaine Ford MRN: [id number] DOB: 12/05/1945 Today's Date: 04/06/2022   History of present illness Pt is a 76 y.o. female who presented 03/28/22 with R-sided weakness and aphasia. Pt administered TNK. S/p diagnostic cerebral angiogram and mechanical thrombectomy, complicated by small [facility] 9/18. ETT 9/18-9/19. Noted to have left MCA infarct due to left M1 occlusion. PMHx of gallstones, gestational diabetes, HLD, HTN LBBB s/p pacemaker placement and hypotension, CVA   OT comments  Pt making progress towards OT goals this session. Able to complete multiple sit<>stand with mod A +2, steps with mod to max A+2 for short ambulation to recliner. Stood at sink to perform grooming with L hand at [employer]. For stand at sink required increased RLE blocking. R later lean more prominent with increased complexity of task. SOn and husband present throughout session. Pt remains appropriate for CIR level therapy.   Recommendations for follow up therapy are one component of a multi-disciplinary discharge planning process, led by the attending physician.  Recommendations may be updated based on patient status, additional functional criteria and insurance authorization.    Follow Up Recommendations  Acute inpatient rehab (3hours/day)    Assistance Recommended at Discharge Frequent or constant Supervision/Assistance  Patient can return home with the following  A lot of help with walking and/or transfers;A lot of help with bathing/dressing/bathroom;Assistance with cooking/housework;Help with stairs or ramp for entrance;Assist for transportation;Direct supervision/assist for financial management;Direct supervision/assist for medications management   Equipment Recommendations  Other (comment) (TBD at next venue of care)    Recommendations for Other Services Rehab consult    Precautions / Restrictions Precautions Precautions: Fall Precaution Comments: SBP < 160; R  lateral lean Restrictions Weight Bearing Restrictions: No       Mobility Bed Mobility Overal bed mobility: Needs Assistance Bed Mobility: Rolling, Sidelying to Sit Rolling: Mod assist   Supine to sit: Mod assist, HOB elevated     General bed mobility comments: rolled to R with cues and assist for reaching to rail, assist to lift trunk and for R LE off bed and due to R lateral lean Pt likes to have hips very close to edge prior to trunk elevation    Transfers Overall transfer level: Needs assistance Equipment used: 2 person hand held assist Transfers: Sit to/from Stand, Bed to chair/wheelchair/BSC Sit to Stand: Mod assist, +2 physical assistance, +2 safety/equipment     Step pivot transfers: Mod assist, +2 physical assistance, +2 safety/equipment     General transfer comment: R knee improved from previous session, as complexity of task increases R knee block required; cue to pt for visual vertical and correcting R lateral head lean with improved weight shift to L; step pivot to recliner with assist for moving R foot and blocking knee for safety, multimodal cues for sequencing and safety, assist with weight shift     Balance Overall balance assessment: Needs assistance Sitting-balance support: Feet supported Sitting balance-Leahy Scale: Poor Sitting balance - Comments: R lateral lean; assist for upright orientation in sitting Postural control: Right lateral lean Standing balance support: Single extremity supported Standing balance-Leahy Scale: Poor Standing balance comment: Mod-maxA for standing balance with noted strong R lateral lean                           ADL either performed or assessed with clinical judgement   ADL Overall ADL's : Needs assistance/impaired     Grooming: Moderate assistance;Standing;Wash/dry face;Brushing hair Grooming Details (  indicate cue type and reason): Pt required mod A +2 for standing balance at sink, utilizing LUE to perform  grooming tasks, R hand placed on sink surface for increased input through WB, L lateral lean in standing Pt unable to self correct.         Upper Body Dressing : Moderate assistance;Sitting Upper Body Dressing Details (indicate cue type and reason): to don second gown as English as a second language teacher: Moderate assistance;+2 for physical assistance;+2 for safety/equipment;Stand-pivot;BSC/3in1 Toilet Transfer Details (indicate cue type and reason): simulated through recliner transfer [place] and Hygiene: Maximal assistance;+2 for physical assistance;+2 for safety/equipment;Sit to/from stand       Functional mobility during ADLs: Maximal assistance;+2 for physical assistance;+2 for safety/equipment General ADL Comments: as complexity of task increases, Pt cannot multitask    Extremity/Trunk Assessment Upper Extremity Assessment Upper Extremity Assessment: RUE deficits/detail RUE Deficits / Details: PROM remains WFL, slight edema throughout. Tract activation noted proximally, potentially pulling centrally from shoulder RUE Sensation: decreased light touch;decreased proprioception RUE Coordination: decreased fine motor;decreased gross motor            Vision   Additional Comments: continued poor sustained attention to the right, but will cross midline and look to R   Perception Perception Perception: Impaired   Praxis      Cognition Arousal/Alertness: Awake/alert Behavior During Therapy: Flat affect Overall Cognitive Status: Impaired/Different from baseline Area of Impairment: Attention, Following commands, Safety/judgement, Problem solving, Awareness                   Current Attention Level: Sustained   Following Commands: Follows one step commands inconsistently, Follows one step commands with increased time (66% of the time) Safety/Judgement: Decreased awareness of safety, Decreased awareness of deficits Awareness: Intellectual Problem  Solving: Slow processing, Difficulty sequencing, Requires verbal cues, Requires tactile cues General Comments: Pt speaking both english and spanish. Seems to follow directions better in spanish. some R inattention will attend with multimodal cues. With increased complexity of tasks required, attention decreases        Exercises Exercises: Other exercises Other Exercises Other Exercises: PROM of RUE at shoulder, elbow, and digits    Shoulder Instructions       General Comments son and husband in room throughout session - acting as translator    Pertinent Vitals/ Pain       Pain Assessment Pain Assessment: Faces Faces Pain Scale: Hurts a little bit Pain Location: R arm with PROM Pain Descriptors / Indicators: Discomfort, Sore Pain Intervention(s): Monitored during session, Repositioned  Home Living                                          Prior Functioning/Environment              Frequency  Min 3X/week        Progress Toward Goals  OT Goals(current goals can now be found in the care plan section)  Progress towards OT goals: Progressing toward goals  Acute Rehab OT Goals Patient Stated Goal: get to rehab OT Goal Formulation: With family Time For Goal Achievement: 04/13/22 Potential to Achieve Goals: Good  Plan Discharge plan remains appropriate    Co-evaluation    PT/OT/SLP Co-Evaluation/Treatment: Yes Reason for Co-Treatment: For patient/therapist safety;To address functional/ADL transfers;Necessary to address cognition/behavior during functional activity PT goals addressed during session: Mobility/safety with mobility;Balance;Strengthening/ROM OT goals  addressed during session: ADL's and self-care;Strengthening/ROM;Other (comment) (cognition, vision)      AM-PAC OT "6 Clicks" Daily Activity     Outcome Measure   Help from another person eating meals?: A Lot Help from another person taking care of personal grooming?: A Lot Help from  another person toileting, which includes using toliet, bedpan, or urinal?: Total Help from another person bathing (including washing, rinsing, drying)?: Total Help from another person to put on and taking off regular upper body clothing?: Total Help from another person to put on and taking off regular lower body clothing?: Total 6 Click Score: 8    End of Session Equipment Utilized During Treatment: Gait belt  OT Visit Diagnosis: Unsteadiness on feet (R26.81);Other abnormalities of gait and mobility (R26.89);Muscle weakness (generalized) (M62.81);Cognitive communication deficit (R41.841);Other symptoms and signs involving cognitive function;Hemiplegia and hemiparesis Symptoms and signs involving cognitive functions: Cerebral infarction Hemiplegia - Right/Left: Right Hemiplegia - dominant/non-dominant: Dominant Hemiplegia - caused by: Cerebral infarction   Activity Tolerance Patient tolerated treatment well   Patient Left in chair;with call bell/phone within reach;with chair alarm set;with family/visitor present   Nurse Communication Mobility status;Precautions        Time: 6389-3734 OT Time Calculation (min): 34 min  Charges: OT General Charges $OT Visit: 1 Visit OT Treatments $Self Care/Home Management : 8-22 mins  Jesse Sans OTR/L Acute Rehabilitation Services Office: [place] 04/06/2022, 11:17 AM

## 2022-04-06 NOTE — Progress Notes (Signed)
Inpatient Rehabilitation Admission Medication Review by a Pharmacist  A complete drug regimen review was completed for this patient to identify any potential clinically significant medication issues.  High Risk Drug Classes Is patient taking? Indication by Medication  Antipsychotic Yes  Compazine- Nausea and Vomiting   Anticoagulant No   Antibiotic No   Opioid Yes Tramadol- Moderate/Severe Pain   Antiplatelet Yes Aspirin, Plavix - Stroke   Hypoglycemics/insulin No   Vasoactive Medication Yes  Carvedilol- Hypertension  Hydralazine - Hypertension   Chemotherapy No   Other Yes Atorvastatin- Hyperlipidemia, secondary prophylaxis for stroke  Docusate, fleet enema, docusate/senna- bowel regimen  Maalox- indigestion  Pantoprazole- acid reflex      Type of Medication Issue Identified Description of Issue Recommendation(s)  Drug Interaction(s) (clinically significant)     Duplicate Therapy     Allergy     No Medication Administration End Date     Incorrect Dose     Additional Drug Therapy Needed     Significant med changes from prior encounter (inform family/care partners about these prior to discharge).    Other       Clinically significant medication issues were identified that warrant physician communication and completion of prescribed/recommended actions by midnight of the next day:  No  Name of provider notified for urgent issues identified: NA   Provider Method of Notification: NA    Pharmacist comments: Per neurology discharge summary, plan for DAPT x 3 weeks then aspirin monotherapy. Stop date placed on clopidogrel.   Time spent performing this drug regimen review (minutes):  20   Ventura Sellers 04/06/2022 3:45 PM

## 2022-04-06 NOTE — Progress Notes (Signed)
Physical Therapy Treatment Patient Details Name: Elaine Ford MRN: [id number] DOB: Apr 26, 1946 Today's Date: 04/06/2022   History of Present Illness Pt is a 76 y.o. female who presented 03/28/22 with R-sided weakness and aphasia. Pt administered TNK. S/p diagnostic cerebral angiogram and mechanical thrombectomy, complicated by small Elaine Ford 9/18. ETT 9/18-9/19. Noted to have left MCA infarct due to left M1 occlusion. PMHx of gallstones, gestational diabetes, HLD, HTN LBBB s/p pacemaker placement and hypotension, CVA    PT Comments    Patient seen with OT to facilitate initiating gait.  She was able to take steps but remains with poor midline orientation needing assist for L lateral weight shift and at times to progress R foot and stabilize knee in stance.  She took about 6-7 steps, but began leaning and not stepping with L so placed chair behind her.  Working on standing balance with functional tasks at sink supporting with +2 A for safety and pt able to brush her hair and wipe her face.  Spouse and son present and supportive.   She remains appropriate for acute inpatient rehab.  Noted plans to transfer today.    Recommendations for follow up therapy are one component of a multi-disciplinary discharge planning process, led by the attending physician.  Recommendations may be updated based on patient status, additional functional criteria and insurance authorization.  Follow Up Recommendations  Acute inpatient rehab (3hours/day)     Assistance Recommended at Discharge Frequent or constant Supervision/Assistance  Patient can return home with the following Two people to help with walking and/or transfers;A lot of help with bathing/dressing/bathroom;Assistance with cooking/housework;Direct supervision/assist for medications management;Assist for transportation;Help with stairs or ramp for entrance   Equipment Recommendations  Other (comment) (TBA)    Recommendations for Other Services        Precautions / Restrictions Precautions Precautions: Fall Precaution Comments: SBP < 160; R lateral lean Restrictions Weight Bearing Restrictions: No     Mobility  Bed Mobility Overal bed mobility: Needs Assistance Bed Mobility: Rolling, Sidelying to Sit Rolling: Mod assist   Supine to sit: Mod assist, HOB elevated, +2 for safety/equipment     General bed mobility comments: rolled to R with cues and assist for reaching to rail, assist to lift trunk and for R LE off bed and due to R lateral lean Pt likes to have hips very close to edge prior to trunk elevation    Transfers Overall transfer level: Needs assistance Equipment used: 2 person hand held assist Transfers: Sit to/from Stand, Bed to chair/wheelchair/BSC Sit to Stand: Mod assist, +2 physical assistance, +2 safety/equipment   Step pivot transfers: Mod assist, +2 physical assistance, +2 safety/equipment       General transfer comment: assist for balance to lean to L and for stepping at times with R LE, at times in standing and to encourage stepping with L blocked R knee; using visual cues for correction head/body to midline as leaning R; needs multimodal cues    Ambulation/Gait Ambulation/Gait assistance: Mod assist, +2 physical assistance Gait Distance (Feet): 4 Feet Assistive device: 2 person hand held assist Gait Pattern/deviations: Step-to pattern, Decreased stride length, Wide base of support, Knees buckling       General Gait Details: difficulty stepping with L more than R possibly due to fear of falling, but able to step several steps toward bathroom door, then as pt began to limit stepping with L placed chair behind to sit.   Stairs  Wheelchair Mobility    Modified Rankin (Stroke Patients Only) Modified Rankin (Stroke Patients Only) Pre-Morbid Rankin Score: No symptoms Modified Rankin: Moderately severe disability     Balance Overall balance assessment: Needs  assistance Sitting-balance support: Feet supported Sitting balance-Leahy Scale: Poor Sitting balance - Comments: R lateral lean; assist for upright orientation in sitting Postural control: Right lateral lean Standing balance support: Single extremity supported Standing balance-Leahy Scale: Poor Standing balance comment: stood at sink with OT with both hands on sink, able to turn on water and wipe her face with L UE and brush her hair noted less pushing and mod A of 2 for balance throughout and blocking R knee; using mirror some, but not at appropriate angle for pt to use for midline positioning                            Cognition Arousal/Alertness: Awake/alert Behavior During Therapy: Flat affect Overall Cognitive Status: Impaired/Different from baseline Area of Impairment: Attention, Following commands, Safety/judgement, Problem solving, Awareness                   Current Attention Level: Sustained   Following Commands: Follows one step commands inconsistently, Follows one step commands with increased time Safety/Judgement: Decreased awareness of safety, Decreased awareness of deficits   Problem Solving: Slow processing, Difficulty sequencing, Requires verbal cues, Requires tactile cues General Comments: Pt speaking both english and spanish. At times able to follow directions better in spanish. some R inattention will attend with multimodal cues. With increased complexity of tasks required and with fatigue, attention decreases        Exercises      General Comments General comments (skin integrity, edema, etc.): spouse and son in the room, pt with secretions from mouth intermittently during session      Pertinent Vitals/Pain Pain Assessment Faces Pain Scale: Hurts a little bit Pain Location: R arm with PROM Pain Descriptors / Indicators: Discomfort, Sore Pain Intervention(s): Monitored during session    Home Living                           Prior Function            PT Goals (current goals can now be found in the care plan section) Progress towards PT goals: Progressing toward goals    Frequency           PT Plan Current plan remains appropriate    Co-evaluation PT/OT/SLP Co-Evaluation/Treatment: Yes Reason for Co-Treatment: For patient/therapist safety;To address functional/ADL transfers PT goals addressed during session: Mobility/safety with mobility;Balance;Strengthening/ROM OT goals addressed during session: ADL's and self-care;Strengthening/ROM;Other (comment) (cognition, vision)      AM-PAC PT "6 Clicks" Mobility   Outcome Measure  Help needed turning from your back to your side while in a flat bed without using bedrails?: A Lot Help needed moving from lying on your back to sitting on the side of a flat bed without using bedrails?: A Lot Help needed moving to and from a bed to a chair (including a wheelchair)?: Total Help needed standing up from a chair using your arms (e.g., wheelchair or bedside chair)?: Total Help needed to walk in hospital room?: Total Help needed climbing 3-5 steps with a railing? : Total 6 Click Score: 8    End of Session Equipment Utilized During Treatment: Gait belt Activity Tolerance: Patient tolerated treatment well Patient left: in chair;with call bell/phone within  reach;with chair alarm set;with family/visitor present   PT Visit Diagnosis: Other abnormalities of gait and mobility (R26.89);Other symptoms and signs involving the nervous system (R29.898);Hemiplegia and hemiparesis Hemiplegia - Right/Left: Right Hemiplegia - dominant/non-dominant: Dominant Hemiplegia - caused by: Cerebral infarction     Time: 3672-5500 PT Time Calculation (min) (ACUTE ONLY): 34 min  Charges:  $Gait Training: 8-22 mins                     Magda Kiel, PT Acute Rehabilitation Services [phone] 04/06/2022    Reginia Naas 04/06/2022, 1:46 PM

## 2022-04-06 NOTE — Progress Notes (Signed)
Kirsteins, Luanna Salk, MD  Physician Other PMR Pre-admission    Signed Date of Service:  04/04/2022  1:20 PM  Related encounter: ED to Hosp-Admission (Discharged) from 03/28/2022 in [place]      PMR Admission Coordinator Pre-Admission Assessment   Patient: Elaine Ford is an 76 y.o., female MRN: [id number] DOB: 07-Feb-1946 Height: '5\' 4"'  (162.6 cm) Weight: 88.8 kg   Insurance Information HMO:     PPO:      PCP:      IPA:      80/20:      OTHER:  PRIMARY: [employer]      Policy#: [id number]      Subscriber: pt CM Name: Mendel Ryder      Phone#: [phone] ext [phone]     Fax#: [phone] Pre-Cert#: [id number] approved  for 7 days with f/u with Hilbert Odor phone [phone] ext [phone] same fax      Employer:  Benefits:  Phone #: [phone]     Name: 9/25 Eff. Date: 07/11/21     Deduct: none      Out of Pocket Max: $3400      Life Max: none CIR: $295 co pay per day days 1 until 6      SNF: no copay days 1 until 20; $196 co pay per day days 21 until 100 Outpatient: $10 to $20 per visit     Co-Pay: visits per medical neccesity Home Health: 100%      Co-Pay: visits per medical neccesity DME: 80%     Co-Pay: 20% Providers: in network  SECONDARY: none      Policy#:      Phone#:    Development worker, community:       Phone#:    The Engineer, petroleum" for patients in Inpatient Rehabilitation Facilities with attached "Privacy Act [place] Records" was provided and verbally reviewed with: Patient and Family   Emergency Contact Information Contact Information       Name Relation Home Work Mobile    Heinzman,Jorge Spouse [phone]   [phone]         Current Medical History  Patient Admitting Diagnosis: CVA   History of Present Illness: 76 year old female with history of gallstones, gestational diabetes, HLD, HTN, LBBB s/p pacemaker and hypotension. Presented on 03/28/22 with acute onset right hemiplegia and aphasia.     She received TNK and underwent a mechanical thrombectomy for a proximal left M1 occlusion. Repeat head CT showed extensive stroke in the Left T MCA territory with cytotoxic edema and mild midline shift. Post IR with CT small SAH. Not able to do MRI due to pacemaker. 2 D echo 60-65 % normal LV function. Pacemaker interrogated showed only 10 seconds of a flutter. Cardiology consulted and felt not enough evidence for anticoagulation. LDL 104,m Hgb A1c 5.5. On no antithrombotic pta, now on ASA and Plavix. Coreg for HTN. Resume atorvastatin at discharge. SLP evaluation and did not pass swallow. Cortrak placed for tube feeding.    Complete NIHSS TOTAL: 20   Patient's medical record from [facility] has been reviewed by the rehabilitation admission coordinator and physician.   Past Medical History      Past Medical History:  Diagnosis Date   Back pain     Broken arm      age 68 years old broke right arm after a fall   Gall bladder stones     Gestational diabetes  Hemorrhoid     Hx of cardiac cath      [facility] 07/24/14 at [facility] - no significant CAD   Hx of cardiovascular stress test      [facility]iew 11/14:  No ischemia   Hx of echocardiogram      Echo 07/23/14 - at [facility]:  EF 65-70%, impaired relaxation, mild LAE, mild MR, mild AI, mild elevated PASP (44.2 mmHg)   Hyperlipidemia     Hypertension     LBBB (left bundle branch block)     Low blood pressure     Nerve damage      right arm   Pain      Left upper chest s/p pacer maker insertion site 07/2014   S/P cardiac pacemaker procedure 07/2014    [employer] - Dr. Lovena Le   Stroke [facility])      patient was unaware noted prior to pacermaker placement unknown when it occurred    Has the patient had major surgery during 100 days prior to admission? Yes   Family History   family history includes Heart attack in her father; Heart disease in her brother; Stroke in her father.   Current Medications   Current Facility-Administered Medications:     acetaminophen (TYLENOL) suppository 650 mg, 650 mg, Rectal, Q6H PRN, Mauri Brooklyn, MD, 650 mg at 04/01/22 1742   aspirin chewable tablet 81 mg, 81 mg, Per Tube, Daily, 81 mg at 04/06/22 0937 **OR** [DISCONTINUED] aspirin suppository 150 mg, 150 mg, Rectal, Daily, Millen, Jessica B, RPH   atorvastatin (LIPITOR) tablet 40 mg, 40 mg, Per Tube, Daily, Millen, Jessica B, RPH, 40 mg at 04/06/22 0936   carvedilol (COREG) tablet 25 mg, 25 mg, Per Tube, BID WC, Corey Harold, NP, 25 mg at 04/06/22 6384   Chlorhexidine Gluconate Cloth 2 % PADS 6 each, 6 each, Topical, Daily, Kerney Elbe, MD, 6 each at 04/06/22 6659   clopidogrel (PLAVIX) tablet 75 mg, 75 mg, Per Tube, Daily, Shafer, Devon, NP, 75 mg at 04/06/22 0936   docusate (COLACE) 50 MG/5ML liquid 100 mg, 100 mg, Per Tube, BID, Rozann Lesches, RPH, 100 mg at 04/06/22 0936   feeding supplement (OSMOLITE 1.5 CAL) liquid 1,000 mL, 1,000 mL, Per Tube, Continuous, Corey Harold, NP, Last Rate: 45 mL/hr at 04/03/22 0142, 1,000 mL at 04/03/22 0142   feeding supplement (PROSource TF20) liquid 60 mL, 60 mL, Per Tube, Daily, Corey Harold, NP, 60 mL at 04/06/22 0936   hydrALAZINE (APRESOLINE) injection 10 mg, 10 mg, Intravenous, Q4H PRN, de Yolanda Manges, Cortney E, NP, 10 mg at 04/04/22 1256   labetalol (NORMODYNE) injection 20 mg, 20 mg, Intravenous, Q2H PRN, Rosalin Hawking, MD, 20 mg at 03/31/22 1812   Oral care mouth rinse, 15 mL, Mouth Rinse, PRN, Kerney Elbe, MD   pantoprazole (PROTONIX) 2 mg/mL oral suspension 40 mg, 40 mg, Per Tube, QHS, Millen, Jessica B, RPH, 40 mg at 04/05/22 2122   phenol (CHLORASEPTIC) mouth spray 2 spray, 2 spray, Mouth/Throat, PRN, Garvin Fila, MD, 2 spray at 04/05/22 1629   senna-docusate (Senokot-S) tablet 1 tablet, 1 tablet, Per Tube, QHS PRN, Kerney Elbe, MD, 1 tablet at 04/03/22 2201   traMADol (ULTRAM) tablet 50 mg, 50 mg, Per Tube, Q6H PRN, Amie Portland, MD, 50 mg at 04/05/22 2122   Patients Current Diet:  Diet  Order                  Diet NPO time specified Except for: Ice  Chips  Diet effective now                       Precautions / Restrictions Precautions Precautions: Fall Precaution Comments: SBP < 160; R lateral lean Restrictions Weight Bearing Restrictions: No    Has the patient had 2 or more falls or a fall with injury in the past year? No   Prior Activity Level Community (5-7x/wk): Independent and active without AD   Prior Functional Level Self Care: Did the patient need help bathing, dressing, using the toilet or eating? Independent   Indoor Mobility: Did the patient need assistance with walking from room to room (with or without device)? Independent   Stairs: Did the patient need assistance with internal or external stairs (with or without device)? Independent   Functional Cognition: Did the patient need help planning regular tasks such as shopping or remembering to take medications? Independent   Patient Information Are you of Hispanic, Latino/a,or Spanish origin?: C. Yes, [place] What is your race?: A. White Do you need or want an interpreter to communicate with a doctor or health care staff?: 9. Unable to respond   Patient's Response To:  Health Literacy and Transportation Is the patient able to respond to health literacy and transportation needs?: No Health Literacy - How often do you need to have someone help you when you read instructions, pamphlets, or other written material from your doctor or pharmacy?: Patient unable to respond In the past 12 months, has lack of transportation kept you from medical appointments or from getting medications?: No In the past 12 months, has lack of transportation kept you from meetings, work, or from getting things needed for daily living?: No   Development worker, international aid / [place]: Grab bars - toilet, Grab bars - tub/shower, Cane - single point, Shower seat   Prior Device Use: Indicate devices/aids used by  the patient prior to current illness, exacerbation or injury? None of the above   Current Functional Level Cognition   Arousal/Alertness: Awake/alert Overall Cognitive Status: Impaired/Different from baseline Difficult to assess due to: Impaired communication, Non-English speaking Current Attention Level: Sustained Orientation Level: Oriented to person, Disoriented to place, Disoriented to time, Disoriented to situation Following Commands: Follows one step commands inconsistently, Follows one step commands with increased time Safety/Judgement: Decreased awareness of safety, Decreased awareness of deficits General Comments: patient answering some in [place], but needs some commands in Spanish and son present and assisting when pt not following commands, but not following either to stick out her tongue to assess bleeding (likely due to aphasia) Attention: Focused, Sustained Focused Attention: Appears intact Sustained Attention: Appears intact    Extremity Assessment (includes Sensation/Coordination)   Upper Extremity Assessment: RUE deficits/detail RUE Deficits / Details: PROM remains WFL, slight edema throughout. Tract activation noted proximally. Hand over hand assist for oral care RUE Sensation: decreased light touch, decreased proprioception RUE Coordination: decreased fine motor, decreased gross motor  Lower Extremity Assessment: Defer to PT evaluation RLE Deficits / Details: Noted weakness grossly throughout, difficult to formally assess due to cog/communication deficits, but no noted buckling with weight bearing, difficulty flexing hip to advance foot though; noted decreased sensation through observing lack or minimal withdrawal to noxious stimuli RLE Sensation: decreased light touch RLE Coordination: decreased gross motor     ADLs   Overall ADL's : Needs assistance/impaired Grooming: Minimal assistance, Total assistance Grooming Details (indicate cue type and reason): min A for  LUE use; total A for  RUE involvement with hadn over hand assist Upper Body Bathing: Moderate assistance, Sitting Upper Body Bathing Details (indicate cue type and reason): EOB unsupported Lower Body Bathing: Maximal assistance, +2 for safety/equipment, Sit to/from stand Lower Body Bathing Details (indicate cue type and reason): simulated Lower Body Dressing: Maximal assistance, +2 for safety/equipment, Sit to/from stand Lower Body Dressing Details (indicate cue type and reason): simulated Toilet Transfer: Moderate assistance, +2 for physical assistance, +2 for safety/equipment, Stand-pivot, BSC/3in1, Rolling walker (2 wheels) Toilet Transfer Details (indicate cue type and reason): simulated chair to bed Toileting- Clothing Manipulation and Hygiene: Maximal assistance, +2 for physical assistance, +2 for safety/equipment, Sit to/from stand Toileting - Clothing Manipulation Details (indicate cue type and reason): hygiene in standing after toileting Functional mobility during ADLs: Moderate assistance, +2 for physical assistance, +2 for safety/equipment, Rolling walker (2 wheels), Cueing for safety, Cueing for sequencing General ADL Comments: continues to be limited by R hemi, R inattention and impiared communication. improved sitting balance and transfers. Pt continues to have R lateral lean, but able to take small steps with RW and mod  A+2     Mobility   Overal bed mobility: Needs Assistance Bed Mobility: Rolling, Sidelying to Sit Rolling: Mod assist Sidelying to sit: Mod assist, Max assist Supine to sit: Mod assist, HOB elevated Sit to supine: Max assist, HOB elevated General bed mobility comments: rolled to R with cues and assist for reaching to rail, assist to lift trunk and for R LE off bed and due to R lateral lean     Transfers   Overall transfer level: Needs assistance Equipment used: 1 person hand held assist Transfers: Sit to/from Stand, Bed to chair/wheelchair/BSC Sit to Stand:  Mod assist Bed to/from chair/wheelchair/BSC transfer type:: Stand pivot Stand pivot transfers: Mod assist, +2 safety/equipment Step pivot transfers: Mod assist, +2 physical assistance General transfer comment: up to stand x 2 with A for R knee block and for upright balance with son present and cue to pt for visual vertical and correcting R lateral head lean with improved weight shift to L; step pivot to recliner with assist for moving R foot and blocking knee for safety, son present for HHA on L as well during transition to chair.     Ambulation / Gait / Stairs / Wheelchair Mobility   Ambulation/Gait Ambulation/Gait assistance: Mod assist Gait Distance (Feet): 2 Feet Assistive device: 1 person hand held assist Gait Pattern/deviations: Step-through pattern, Decreased step length - right, Decreased stride length, Decreased dorsiflexion - right, Decreased weight shift to right, Decreased weight shift to left General Gait Details: ModA to steady and cue lateral weight shifting to direct pt to step to L recliner > bed. Good initiation by pt to take steps with no knee buckling noted. Gait velocity: reduced Gait velocity interpretation: <1.31 ft/sec, indicative of household ambulator Pre-gait activities: MaxA for lateral weight shifting and cuing pt to lift contralateral foot while standing at EOB with UE support on therapist anterior to her. Pt with strong R lateral lean     Posture / Balance Dynamic Sitting Balance Sitting balance - Comments: R lateral lean; assist for upright orientation in sitting using son for visual target and correcting head to neutral for improved visual orientation Balance Overall balance assessment: Needs assistance Sitting-balance support: Feet supported Sitting balance-Leahy Scale: Poor Sitting balance - Comments: R lateral lean; assist for upright orientation in sitting using son for visual target and correcting head to neutral for improved visual orientation Postural  control: Right lateral  lean Standing balance support: Single extremity supported Standing balance-Leahy Scale: Poor Standing balance comment: Mod-maxA for standing balance with noted strong R lateral lean     Special needs/care consideration 10 fr left nare feeding tube placed on 9/21 Spanish interpreter to be arranged for therapy sessions and prn as needed    Previous Home Environment  Living Arrangements: Spouse/significant other  Lives With: Spouse Available Help at Discharge: Family, Available 24 hours/day Type of Home: House Home Layout: Two level, 1/2 bath on main level, Bed/bath upstairs, Able to live on main level with bedroom/bathroom Alternate Level Stairs-Rails: Left Alternate Level Stairs-Number of Steps: 13 Home Access: Stairs to enter Entrance Stairs-Rails: None Entrance Stairs-Number of Steps: 1 Bathroom Shower/Tub: Chiropodist: Handicapped height Bathroom Accessibility: Yes How Accessible: Accessible via walker Bells: No Additional Comments: Pt was driving as well   Discharge Living Setting Plans for Discharge Living Setting: Patient's home, Lives with (comment) (spouse) Type of Home at Discharge: House Discharge Home Layout: Two level, 1/2 bath on main level, Able to live on main level with bedroom/bathroom Alternate Level Stairs-Rails: Left Alternate Level Stairs-Number of Steps: 13 Discharge Home Access: Stairs to enter Entrance Stairs-Rails: None Entrance Stairs-Number of Steps: 1 Discharge Bathroom Shower/Tub: Tub/shower unit Discharge Bathroom Toilet: Handicapped height Discharge Bathroom Accessibility: Yes How Accessible: Accessible via walker Does the patient have any problems obtaining your medications?: No   Social/Family/Support Systems Patient Roles: Spouse, Parent Contact Information: spouse, Cory Roughen Anticipated Caregiver: spouse Anticipated Ambulance person Information: see contacts Ability/Limitations of  Caregiver: no limitations Caregiver Availability: 24/7 Discharge Plan Discussed with Primary Caregiver: Yes Is Caregiver In Agreement with Plan?: Yes Does Caregiver/Family have Issues with Lodging/Transportation while Pt is in Rehab?: No   Goals Patient/Family Goal for Rehab: min asisst with PT, OT and SLP Expected length of stay: ELOS 14 to 20 days Cultural Considerations: typically fluent [place], but now Spanish more naturally Pt/Family Agrees to Admission and willing to participate: Yes Program Orientation Provided & Reviewed with Pt/Caregiver Including Roles  & Responsibilities: Yes Additional Information Needs: Spanish interpreter to be arranged as she is bilingual   Decrease burden of Care through IP rehab admission: n/a   Possible need for SNF placement upon discharge: not anticipated   Patient Condition: I have reviewed medical records from [facility], spoken with CM, and patient and spouse. I met with patient at the bedside for inpatient rehabilitation assessment.  Patient will benefit from ongoing PT, OT, and SLP, can actively participate in 3 hours of therapy a day 5 days of the week, and can make measurable gains during the admission.  Patient will also benefit from the coordinated team approach during an Inpatient Acute Rehabilitation admission.  The patient will receive intensive therapy as well as Rehabilitation physician, nursing, social worker, and care management interventions.  Due to bladder management, bowel management, safety, skin/wound care, disease management, medication administration, pain management, and patient education the patient requires 24 hour a day rehabilitation nursing.  The patient is currently mod to max assist with mobility and basic ADLs.  Discharge setting and therapy post discharge at home with home health is anticipated.  Patient has agreed to participate in the Acute Inpatient Rehabilitation Program and will admit today.   Preadmission  Screen Completed By:  Cleatrice Burke, 04/06/2022 10:58 AM ______________________________________________________________________   Discussed status with Dr. Letta Pate on 04/06/22 at 1058 and received approval for admission today.   Admission Coordinator:  Cleatrice Burke, RN, time [phone]  Date 04/06/22    Assessment/Plan: Diagnosis:L M1 occlusion, infarct Does the need for close, 24 hr/day Medical supervision in concert with the patient's rehab needs make it unreasonable for this patient to be served in a less intensive setting? Yes Co-Morbidities requiring supervision/potential complications: HTN, LBBB Due to bladder management, bowel management, safety, skin/wound care, disease management, medication administration, pain management, and patient education, does the patient require 24 hr/day rehab nursing? Yes Does the patient require coordinated care of a physician, rehab nurse, PT, OT, and SLP to address physical and functional deficits in the context of the above medical diagnosis(es)? Yes Addressing deficits in the following areas: balance, endurance, locomotion, strength, transferring, bowel/bladder control, bathing, dressing, feeding, grooming, toileting, cognition, speech, language, swallowing, and psychosocial support Can the patient actively participate in an intensive therapy program of at least 3 hrs of therapy 5 days a week? Yes The potential for patient to make measurable gains while on inpatient rehab is good Anticipated functional outcomes upon discharge from inpatient rehab: min assist PT, min assist OT, min assist SLP Estimated rehab length of stay to reach the above functional goals is: 14-20d Anticipated discharge destination: Home 10. Overall Rehab/Functional Prognosis: good     MD Signature: Charlett Blake M.D. [facility] Fellow Am Acad of Phys Med and [place] Board of Electrodiagnostic Med Fellow Am Board of Interventional  Pain           Revision History                                Note Details  Author Charlett Blake, MD File Time 04/06/2022 11:03 AM  Author Type Physician Status Signed  Last Editor Charlett Blake, [place] # [id number] [place] Date 04/06/2022

## 2022-04-06 NOTE — Discharge Summary (Addendum)
Stroke Discharge Summary  Patient ID: Elaine Ford   MRN: [id number]      DOB: 22-Sep-1945  Date of Admission: 03/28/2022 Date of Discharge: 04/06/2022  Attending Physician:  .Antony Contras MD Consultant(s):   pulmonary/intensive care and rehabilitation medicine Patient's PCP:  [name], MD  Discharge Diagnoses: Left MCA infarct due to left M1 occlusion s/p TNK and IR with TICI3 complicated by small SAH, etiology unclear  Principal Problem:   Stroke [facility]) Active Problems:   Stroke (cerebrum) ([place]) Aphasia Right hemiplegia Dysphagia   Medications to be continued on Rehab Allergies as of 04/06/2022       Reactions   Ace Inhibitors Cough   Codeine Other (See Comments)   Reaction:  Unknown    Hydrocodone-acetaminophen Other (See Comments)   Reaction:  Unknown    Lisinopril-hydrochlorothiazide Cough   Oxycodone-acetaminophen Nausea And Vomiting        Medication List     STOP taking these medications    Calcium Carbonate-Vitamin D 600-400 MG-UNIT tablet   ibuprofen 600 MG tablet Commonly known as: ADVIL   multivitamin with minerals Tabs tablet   nitroGLYCERIN 0.4 MG SL tablet Commonly known as: NITROSTAT   VITAMIN E PO       TAKE these medications    acetaminophen 650 MG suppository Commonly known as: TYLENOL Place 1 suppository (650 mg total) rectally every 6 (six) hours as needed for fever, mild pain or moderate pain.   aspirin 81 MG chewable tablet Place 1 tablet (81 mg total) into feeding tube daily. Start taking on: April 07, 2022   atorvastatin 40 MG tablet Commonly known as: LIPITOR Place 1 tablet (40 mg total) into feeding tube daily. Start taking on: April 07, 2022   carvedilol 25 MG tablet Commonly known as: COREG Place 1 tablet (25 mg total) into feeding tube 2 (two) times daily with a meal. What changed:  how to take this when to take this   clopidogrel 75 MG tablet Commonly known as: PLAVIX Place 1 tablet (75 mg  total) into feeding tube daily. Start taking on: April 07, 2022   docusate 50 MG/5ML liquid Commonly known as: COLACE Place 10 mLs (100 mg total) into feeding tube 2 (two) times daily.   feeding supplement (OSMOLITE 1.5 CAL) Liqd Place 1,000 mLs into feeding tube continuous.   feeding supplement (PROSource TF20) liquid Place 60 mLs into feeding tube daily. Start taking on: April 07, 2022   hydrALAZINE 20 MG/ML injection Commonly known as: APRESOLINE Inject 0.5 mLs (10 mg total) into the vein every 4 (four) hours as needed (SBP >160).   labetalol 5 MG/ML injection Commonly known as: NORMODYNE Inject 4 mLs (20 mg total) into the vein every 2 (two) hours as needed (SBP >160).   pantoprazole sodium 40 mg Commonly known as: PROTONIX Place 40 mg into feeding tube at bedtime.   senna-docusate 8.6-50 MG tablet Commonly known as: Senokot-S Place 1 tablet into feeding tube at bedtime as needed for mild constipation.   traMADol 50 MG tablet Commonly known as: ULTRAM Place 1 tablet (50 mg total) into feeding tube every 6 (six) hours as needed for moderate pain or severe pain. What changed:  how to take this reasons to take this        LABORATORY STUDIES CBC    Component Value Date/Time   WBC 7.8 04/03/2022 0234   RBC 4.55 04/03/2022 0234   HGB 12.9 04/03/2022 0234   HGB 13.6 07/23/2014 0200  HCT 38.8 04/03/2022 0234   HCT 42.7 07/23/2014 0200   PLT 263 04/03/2022 0234   PLT 246 07/23/2014 0200   MCV 85.3 04/03/2022 0234   MCV 85 07/23/2014 0200   MCH 28.4 04/03/2022 0234   MCHC 33.2 04/03/2022 0234   RDW 14.5 04/03/2022 0234   RDW 14.2 07/23/2014 0200   LYMPHSABS 1.5 03/28/2022 1300   LYMPHSABS 1.9 07/23/2014 0200   MONOABS 0.3 03/28/2022 1300   MONOABS 0.5 07/23/2014 0200   EOSABS 0.1 03/28/2022 1300   EOSABS 0.0 07/23/2014 0200   BASOSABS 0.0 03/28/2022 1300   BASOSABS 0.1 07/23/2014 0200   CMP    Component Value Date/Time   NA 143 04/03/2022 0234    NA 141 07/23/2014 0200   K 4.0 04/03/2022 0234   K 3.9 07/23/2014 0200   CL 106 04/03/2022 0234   CL 106 07/23/2014 0200   CO2 28 04/03/2022 0234   CO2 26 07/23/2014 0200   GLUCOSE 89 04/03/2022 0234   GLUCOSE 87 07/23/2014 0200   BUN 20 04/03/2022 0234   BUN 12 07/23/2014 0200   CREATININE 0.70 04/03/2022 0234   CREATININE 0.87 07/23/2014 0200   CALCIUM 8.8 (L) 04/03/2022 0234   CALCIUM 8.6 07/23/2014 0200   PROT 7.5 03/28/2022 1300   PROT 7.8 07/22/2014 1734   ALBUMIN 3.7 03/28/2022 1300   ALBUMIN 3.1 (L) 07/22/2014 1734   AST 28 03/28/2022 1300   AST 89 (H) 07/22/2014 1734   ALT 21 03/28/2022 1300   ALT 123 (H) 07/22/2014 1734   ALKPHOS 74 03/28/2022 1300   ALKPHOS 83 07/22/2014 1734   BILITOT 0.8 03/28/2022 1300   BILITOT 0.7 07/22/2014 1734   GFRNONAA >60 04/03/2022 0234   GFRNONAA >60 07/23/2014 0200   GFRAA >60 04/21/2016 0538   GFRAA >60 07/23/2014 0200   COAGS Lab Results  Component Value Date   INR 1.0 03/28/2022   INR 1.1 07/22/2014   Lipid Panel    Component Value Date/Time   CHOL 213 (H) 03/29/2022 0839   CHOL 147 07/23/2014 0200   TRIG 364 (H) 03/29/2022 0839   TRIG 127 07/23/2014 0200   HDL 36 (L) 03/29/2022 0839   HDL 38 (L) 07/23/2014 0200   CHOLHDL 5.9 03/29/2022 0839   VLDL 73 (H) 03/29/2022 0839   VLDL 25 07/23/2014 0200   LDLCALC 104 (H) 03/29/2022 0839   LDLCALC 84 07/23/2014 0200   HgbA1C  Lab Results  Component Value Date   HGBA1C 5.5 03/28/2022   Urinalysis    Component Value Date/Time   COLORURINE Straw 07/22/2014 1756   APPEARANCEUR Clear 07/22/2014 1756   LABSPEC 1.005 07/22/2014 1756   PHURINE 8.0 07/22/2014 1756   GLUCOSEU Negative 07/22/2014 1756   HGBUR Negative 07/22/2014 1756   BILIRUBINUR Negative 07/22/2014 1756   KETONESUR Negative 07/22/2014 1756   PROTEINUR Negative 07/22/2014 1756   NITRITE Negative 07/22/2014 1756   LEUKOCYTESUR Negative 07/22/2014 1756   Urine Drug Screen No results found for:  "LABOPIA", "COCAINSCRNUR", "LABBENZ", "AMPHETMU", "THCU", "LABBARB"  Alcohol Level    Component Value Date/Time   ETH <10 03/28/2022 1300     SIGNIFICANT DIAGNOSTIC STUDIES CT HEAD WO CONTRAST (5MM)  Result Date: 04/01/2022 CLINICAL DATA:  Stroke follow-up EXAM: CT HEAD WITHOUT CONTRAST TECHNIQUE: Contiguous axial images were obtained from the base of the skull through the vertex without intravenous contrast. RADIATION DOSE REDUCTION: This exam was performed according to the departmental dose-optimization program which includes automated exposure control, adjustment of the mA  and/or kV according to patient size and/or use of iterative reconstruction technique. COMPARISON:  Three days ago FINDINGS: Brain: Progressively conspicuous large left MCA territory infarct with high-density vessels and petechial hemorrhage. Unchanged small volume subarachnoid hemorrhage along left parietal sulci. Local mass effect without significant midline shift. No new infarct. Small left cerebellar infarct which is stable. No hydrocephalus. Vascular: High-density left MCA branches at the level of infarction. Skull: Normal. Negative for fracture or focal lesion. Sinuses/Orbits: No acute finding. IMPRESSION: No progression of large left MCA territory infarct with petechial and subarachnoid hemorrhage. Electronically Signed   By: Jorje Guild M.D.   On: 04/01/2022 06:09   DG Abd Portable 1V  Result Date: 03/31/2022 CLINICAL DATA:  Feeding tube placement EXAM: PORTABLE ABDOMEN - 1 VIEW COMPARISON:  Abdomen 03/28/2022 FINDINGS: Feeding tube enters the stomach with the tip in the gastric antrum. Normal bowel gas pattern. Pacemaker noted. IMPRESSION: Feeding tube tip in the gastric antrum.  Normal bowel gas pattern. Electronically Signed   By: Franchot Gallo M.D.   On: 03/31/2022 15:04   DG Swallowing Func-Speech Pathology  Result Date: 03/31/2022 Table formatting from the original result was not included. Images from the  original result were not included. Objective Swallowing Evaluation: Type of Study: MBS-Modified Barium Swallow Study  Patient Details Name: TIMERA WINDT MRN: [id number] Date of Birth: Mar 15, 1946 Today's Date: 03/31/2022 Time: SLP Start Time (ACUTE ONLY): 1200 -SLP Stop Time (ACUTE ONLY): 1220 SLP Time Calculation (min) (ACUTE ONLY): 20 min Past Medical History: Past Medical History: Diagnosis Date  Back pain   Broken arm   age 58 years old broke right arm after a fall  Gall bladder stones   Gestational diabetes   Hemorrhoid   Hx of cardiac cath   [facility] 07/24/14 at [facility] - no significant CAD  Hx of cardiovascular stress test   Myoview 11/14:  No ischemia  Hx of echocardiogram   Echo 07/23/14 - at [facility]:  EF 65-70%, impaired relaxation, mild LAE, mild MR, mild AI, mild elevated PASP (44.2 mmHg)  Hyperlipidemia   Hypertension   LBBB (left bundle branch block)   Low blood pressure   Nerve damage   right arm  Pain   Left upper chest s/p pacer maker insertion site 07/2014  S/P cardiac pacemaker procedure 07/2014  [employer] - Dr. Lovena Le  Stroke [facility])   patient was unaware noted prior to pacermaker placement unknown when it occurred Past Surgical History: Past Surgical History: Procedure Laterality Date  COLONOSCOPY    DILATATION & CURETTAGE/HYSTEROSCOPY WITH MYOSURE N/A 02/29/2016  Procedure: DILATATION & CURETTAGE/HYSTEROSCOPY WITH MYOSURE;  Surgeon: [name], DO;  Location: [facility];  Service: Gynecology;  Laterality: N/A;  patient has a pacemaker   GALLBLADDER SURGERY    IR 3D INDEPENDENT WKST  03/28/2022  IR PERCUTANEOUS ART THROMBECTOMY/INFUSION INTRACRANIAL INC DIAG ANGIO  03/28/2022  IR US GUIDE VASC ACCESS RIGHT  03/28/2022  LAPAROSCOPIC BILATERAL SALPINGO OOPHERECTOMY  04/20/2016  Procedure: LAPAROSCOPIC BILATERAL SALPINGO OOPHORECTOMY;  Surgeon: [name], DO;  Location: [facility];  Service: Gynecology;;  LEAD REVISION N/A 07/25/2014  Procedure: LEAD REVISION;  Surgeon: Deboraha Sprang, MD;  Location: [facility] C[facility] LAB;  Service:  Cardiovascular;  Laterality: N/A;  PERMANENT PACEMAKER INSERTION N/A 07/24/2014  Procedure: PERMANENT PACEMAKER INSERTION;  Surgeon: [name], MD;  Location: [facility] CATH LAB;  Service: Cardiovascular;  Laterality: N/A;  RADIOLOGY WITH ANESTHESIA N/A 03/28/2022  Procedure: IR WITH ANESTHESIA;  Surgeon: Radiologist, Medication, MD;  Location: [place];  Service: Radiology;  Laterality: N/A;  VAGINAL HYSTERECTOMY  04/20/2016  Procedure: HYSTERECTOMY VAGINAL;  Surgeon: [name], DO;  Location: [facility];  Service: Gynecology;; HPI: 9/18 admit for CVA.  CT of the head demonstrated hypodensity in the left MCA without hemorrhage.   Systemic TNK mechanical thrombectomy.  Post procedurally on vent to ICU. Postoperative CT did demonstrate small subarachnoid hemorrhage.Extubated 9/19. No MRI at this time.  No data recorded  Recommendations for follow up therapy are one component of a multi-disciplinary discharge planning process, led by the attending physician.  Recommendations may be updated based on patient status, additional functional criteria and insurance authorization. Assessment / Plan / Recommendation   03/30/2022   2:00 PM Clinical Impressions Clinical Impression Pt demonstrates a severe oropharyngeal dysphagia with delayed sensation of aspiration post swallow with inability to eject due to decreased cough drive. Pt has laborious oral transit with rocking behavior to achieve transit. Bolus spills to pyriform with nectar and honey teaspoon. There is an edematous appearance to epiglottis. Decreased base of tongue tension and epiglottic swelling as well as appearance of reduced hyoid excursion lead to decreased epiglottic deflection and airway protection with penetration during the swallow and aspiration of penetrate and vallecualr residue post swallow. Pt looks like she wants to cough, face gets red, pt grimacing, but cough not elicited to eject aspiration. Puree bolus not aspirated but given delay and residue risk is  high. Pt needs decreased edema and increased oropharyngeal control prior to diet initiation. Recommend NPO with Cortrak. SLP Visit Diagnosis Dysphagia, oropharyngeal phase (R13.12) Impact on safety and function Severe aspiration risk     03/30/2022   2:00 PM Treatment Recommendations Treatment Recommendations Therapy as outlined in treatment plan below      No data to display      03/30/2022   2:00 PM Diet Recommendations SLP Diet Recommendations NPO;Alternative means - temporary Medication Administration Via alternative means     03/30/2022   2:00 PM Other Recommendations Follow Up Recommendations Acute inpatient rehab (3hours/day) Assistance recommended at discharge Frequent or constant Supervision/Assistance Functional Status Assessment Patient has had a recent decline in their functional status and demonstrates the ability to make significant improvements in function in a reasonable and predictable amount of time.   03/30/2022   2:00 PM Frequency and Duration  Speech Therapy Frequency (ACUTE ONLY) min 2x/week Treatment Duration 2 weeks     03/30/2022   2:00 PM Oral Phase Oral Phase Impaired Oral - Honey Teaspoon Reduced posterior propulsion;Weak lingual manipulation;Delayed oral transit Oral - Nectar Teaspoon Reduced posterior propulsion;Weak lingual manipulation;Delayed oral transit Oral - Puree Reduced posterior propulsion;Weak lingual manipulation;Delayed oral transit    03/30/2022   2:00 PM Pharyngeal Phase Pharyngeal Phase Impaired Pharyngeal- Honey Teaspoon Penetration/Apiration after swallow;Delayed swallow initiation-pyriform sinuses;Reduced epiglottic inversion;Reduced anterior laryngeal mobility;Reduced tongue base retraction;Moderate aspiration;Pharyngeal residue - valleculae Pharyngeal Material enters airway, passes BELOW cords and not ejected out despite cough attempt by patient;Material enters airway, passes BELOW cords without attempt by patient to eject out (silent aspiration) Pharyngeal- Nectar  Teaspoon Penetration/Apiration after swallow;Delayed swallow initiation-pyriform sinuses;Reduced epiglottic inversion;Reduced anterior laryngeal mobility;Reduced tongue base retraction;Moderate aspiration;Pharyngeal residue - valleculae Pharyngeal Material enters airway, passes BELOW cords without attempt by patient to eject out (silent aspiration);Material enters airway, passes BELOW cords and not ejected out despite cough attempt by patient Pharyngeal- Puree Delayed swallow initiation-pyriform sinuses;Reduced epiglottic inversion;Reduced anterior laryngeal mobility;Reduced tongue base retraction;Pharyngeal residue - valleculae     No data to display    DeBlois, Katherene Ponto  03/31/2022, 11:42 AM                     CT HEAD WO CONTRAST (5MM)  Result Date: 03/29/2022 CLINICAL DATA:  Stroke follow-up EXAM: CT HEAD WITHOUT CONTRAST TECHNIQUE: Contiguous axial images were obtained from the base of the skull through the vertex without intravenous contrast. RADIATION DOSE REDUCTION: This exam was performed according to the departmental dose-optimization program which includes automated exposure control, adjustment of the mA and/or kV according to patient size and/or use of iterative reconstruction technique. COMPARISON:  03/28/2022 FINDINGS: Brain: Hypodensity in the left basal ganglia, insula, and anterior left temporal lobe, likely edema related to the left MCA occlusion and infarct. Slightly increased mass effect on the left lateral ventricle compared to 03/28/2022, with overall unchanged narrowing of the left sylvian fissure. No significant midline shift. No hydrocephalus. The basal cisterns are patent. No definite hyperdensity remains in the left basal ganglia, which was likely represented contrast staining. A small amount of hyperdensity is noted in the left sylvian fissure (series 3, image 20) and in the left frontal and temporal sulci (series 3, image 21), likely subarachnoid hemorrhage; the amount of  hyperdense material in this area has decreased compared to the prior exams, also likely related to dissipating contrast staining. Vascular: No hyperdense vessel. Skull: Normal. Negative for fracture or focal lesion. Sinuses/Orbits: Bubbly fluid in the right sphenoid sinus, which may be related to intubation. The orbits are unremarkable. Other: The mastoids are well aerated. IMPRESSION: 1. Increased edema in the left MCA territory, primarily affecting the left temporal lobe and basal ganglia, with slightly increased mass effect on the left lateral ventricle but no evidence of hydrocephalus or midline shift. 2. Decreased hyperdensity in the left sylvian fissure and left cerebral sulci, likely reflecting a small amount of subarachnoid hemorrhage with dissipation of prior contrast staining. Hyperdensity in the left basal ganglia on the prior exams is no longer evident and favored to have primarily represented contrast staining. Electronically Signed   By: Merilyn Baba M.D.   On: 03/29/2022 16:02   ECHOCARDIOGRAM COMPLETE  Result Date: 03/29/2022    ECHOCARDIOGRAM REPORT   Patient Name:   JAKIRAH ZAUN Date of Exam: 03/29/2022 Medical Rec #:  [id number]      Height:       64.0 in Accession #:    [id number]     Weight:       195.8 lb Date of Birth:  08/25/1945      BSA:          1.939 m Patient Age:    36 years       BP:           133/51 mmHg Patient Gender: F              HR:           62 bpm. Exam Location:  Inpatient Procedure: 2D Echo, Cardiac Doppler and Color Doppler Indications:    Stroke I63.9  History:        Patient has no prior history of Echocardiogram examinations.                 Pacemaker, Stroke, Arrythmias:LBBB; Risk Factors:Dyslipidemia                 and Hypertension.  Sonographer:    Ronny Flurry Sonographer#2:  Melissa Morford RDCS (AE, PE) Referring Phys: [place]  1. Left ventricular ejection fraction, by estimation, is  60 to 65%. The left ventricle has normal function.  The left ventricle has no regional wall motion abnormalities. There is moderate left ventricular hypertrophy. Left ventricular diastolic parameters are consistent with Grade I diastolic dysfunction (impaired relaxation).  2. Right ventricular systolic function is normal. The right ventricular size is normal.  3. The mitral valve is normal in structure. Mild mitral valve regurgitation. No evidence of mitral stenosis. Moderate mitral annular calcification.  4. The aortic valve is tricuspid. Aortic valve regurgitation is mild. No aortic stenosis is present.  5. The inferior vena cava is normal in size with greater than 50% respiratory variability, suggesting right atrial pressure of 3 mmHg. Conclusion(s)/Recommendation(s): No intracardiac source of embolism detected on this transthoracic study. Consider a transesophageal echocardiogram to exclude cardiac source of embolism if clinically indicated. FINDINGS  Left Ventricle: Left ventricular ejection fraction, by estimation, is 60 to 65%. The left ventricle has normal function. The left ventricle has no regional wall motion abnormalities. The left ventricular internal cavity size was normal in size. There is  moderate left ventricular hypertrophy. Abnormal (paradoxical) septal motion, consistent with left bundle branch block. Left ventricular diastolic parameters are consistent with Grade I diastolic dysfunction (impaired relaxation). Right Ventricle: The right ventricular size is normal. No increase in right ventricular wall thickness. Right ventricular systolic function is normal. Left Atrium: Left atrial size was normal in size. Right Atrium: Right atrial size was normal in size. Pericardium: There is no evidence of pericardial effusion. Mitral Valve: The mitral valve is normal in structure. Moderate mitral annular calcification. Mild mitral valve regurgitation. No evidence of mitral valve stenosis. Tricuspid Valve: The tricuspid valve is normal in structure.  Tricuspid valve regurgitation is mild . No evidence of tricuspid stenosis. Aortic Valve: The aortic valve is tricuspid. Aortic valve regurgitation is mild. Aortic regurgitation PHT measures 457 msec. No aortic stenosis is present. Pulmonic Valve: The pulmonic valve was normal in structure. Pulmonic valve regurgitation is not visualized. No evidence of pulmonic stenosis. Aorta: The aortic root is normal in size and structure. Venous: The inferior vena cava is normal in size with greater than 50% respiratory variability, suggesting right atrial pressure of 3 mmHg. IAS/Shunts: No atrial level shunt detected by color flow Doppler.  LEFT VENTRICLE PLAX 2D LVIDd:         3.55 cm LV PW:         1.35 cm LV IVS:        1.00 cm LVOT diam:     1.80 cm LV SV:         82 LV SV Index:   42 LVOT Area:     2.54 cm  RIGHT VENTRICLE RV S prime:     12.90 cm/s TAPSE (M-mode): 2.2 cm LEFT ATRIUM           Index        RIGHT ATRIUM           Index LA diam:      3.10 cm 1.60 cm/m   RA Area:     13.10 cm LA Vol (A2C): 33.2 ml 17.13 ml/m  RA Volume:   28.10 ml  14.49 ml/m LA Vol (A4C): 45.4 ml 23.42 ml/m  AORTIC VALVE LVOT Vmax:   164.00 cm/s LVOT Vmean:  112.000 cm/s LVOT VTI:    0.321 m AI PHT:      457 msec  AORTA Ao Root diam: 2.90 cm Ao Asc diam:  3.30 cm MITRAL VALVE  TRICUSPID VALVE MV Area (PHT): 2.71 cm     TR Peak grad:   23.6 mmHg MV Decel Time: 280 msec     TR Vmax:        243.00 cm/s MV E velocity: 108.00 cm/s MV A velocity: 147.00 cm/s  SHUNTS MV E/A ratio:  0.73         Systemic VTI:  0.32 m                             Systemic Diam: 1.80 cm Candee Furbish MD Electronically signed by Candee Furbish MD Signature Date/Time: 03/29/2022/1:01:17 PM    Final    IR PERCUTANEOUS ART THROMBECTOMY/INFUSION INTRACRANIAL INC DIAG ANGIO  Result Date: 03/29/2022 INDICATION: 76 year old female with past medical history significant for gallstones, gestational diabetes, hyperlipidemia, hypertension, left bundle branch block  status post pacemaker placement and hypotension. She presented to of 80 with acute onset of right-sided hemiplegia and aphasia; NIHSS 23. Her last known well was 10 a.m. on 03/28/2022. Head CT showed no large acute territorial infarct or hemorrhage. Hyperdense left M1-M2 was noted. CT angiogram of the head and neck showed an occlusion of the left M1/MCA with poor collaterals. She received IV TNK and was then transferred to our service for a mechanical thrombectomy. EXAM: ULTRASOUND-GUIDED VASCULAR ACCESS DIAGNOSTIC CEREBRAL ANGIOGRAM MECHANICAL THROMBECTOMY FLAT PANEL HEAD CT COMPARISON:  CT/CT angiogram of the head and neck March 28, 2022. MEDICATIONS: No antibiotic was administered. ANESTHESIA/SEDATION: The procedure was performed under general anesthesia. CONTRAST:  150 mL of Omnipaque 300 milligram/mL FLUOROSCOPY: Radiation Exposure Index (as provided by the fluoroscopic device): 1,448 mGy Kerma COMPLICATIONS: SIR Level A - No therapy, no consequence. TECHNIQUE: Informed written consent was obtained from the patient's husband after a thorough discussion of the procedural risks, benefits and alternatives. All questions were addressed. Maximal Sterile Barrier Technique was utilized including caps, mask, sterile gowns, sterile gloves, sterile drape, hand hygiene and skin antiseptic. A timeout was performed prior to the initiation of the procedure. The right groin was prepped and draped in the usual sterile fashion. Using a micropuncture kit and the modified Seldinger technique, access was gained to the right common femoral artery and an 8 French sheath was placed. Real-time ultrasound guidance was utilized for vascular access including the acquisition of a permanent ultrasound image documenting patency of the accessed vessel. Under fluoroscopy, a Zoom 88 guide catheter was navigated over a 6 [place] VTK catheter and a 0.035" Terumo Glidewire into the aortic arch. The catheter was placed into the left common  carotid artery and then advanced into the left internal carotid artery. The diagnostic catheter was removed. Left anterior oblique angiograms of the head was obtained. FINDINGS: 1. Normal caliber of the right common femoral artery, adequate for vascular access. 2. Increase tortuosity of the upper cervical segment of the left ICA. 3. Occlusion of the proximal left M2/MCA. PROCEDURE: Using roadmap guidance, a Zoom 55 aspiration catheter was navigated over Colossus 35 microguidewire into the cavernous segment of the left ICA. The aspiration catheter was then advanced to the level of occlusion in the M1 segment and connected to an aspiration pump. Continuous aspiration was performed for 2 minutes. The guide catheter was connected to a VacLok syringe. The aspiration catheter was subsequently removed under constant aspiration. The guide catheter was aspirated for debris. Left internal carotid artery angiograms with left anterior oblique views of the head were obtained. Persistent occlusion of the left M1 segment  was seen. Using roadmap guidance, a zoom 55 aspiration catheter was navigated over a phenom 21 microcatheter and a Aristotle 14 microguidewire into the cavernous segment of the left ICA. The microcatheter was then navigated over the wire into the left M2/MCA superior division branch. Then, a 4 x 40 mm solitaire stent retriever was deployed spanning the M1-M2 segments. The device was allowed to intercalated with the clot for 4 minutes. The microcatheter was removed. The aspiration catheter was advanced to the level of occlusion and connected to an aspiration pump. The guiding catheter balloon was inflated. The thrombectomy device and aspiration catheter were removed under constant aspiration. Left internal carotid artery angiograms with left anterior oblique view showed recanalization of the superior division branch. Using roadmap guidance, a zoom 55 aspiration catheter was navigated over a phenom 21 microcatheter  and a Aristotle 14 microguidewire into the cavernous segment of the left ICA. The microcatheter was then navigated over the wire into the left M2/MCA superior division branch. Frontal and lateral angiograms were obtained via microcatheter contrast injection. The catheter was retracted into the M1 segment. Frontal and lateral angiograms were obtained via aspiration catheter contrast injection. Recanalization of the second superior division branch was noted. Using roadmap guidance, a 5 [place] Esperance aspiration catheter was navigated over a Prowler EX microcatheter and a synchro 2 microguidewire into the cavernous segment of the left ICA. The microcatheter was then navigated over the wire into the left M2/MCA inferior division branch. Then, a 3 x 40 mm solitaire stent retriever was deployed spanning the M2 segment. The device was allowed to intercalated with the clot for 4 minutes. The microcatheter was removed. The aspiration catheter was advanced to the level of occlusion and connected to an aspiration pump. The thrombectomy device and aspiration catheter were removed under constant aspiration. Left internal carotid or else with left anterior oblique view showed recanalization of the proximal aspect of the left M2/MCA inferior division branch with persistent occlusion distal to its origin. Using roadmap guidance, a 5 [place] Esperance aspiration catheter was navigated over a Prowler EX microcatheter and a synchro 2 microguidewire into the cavernous segment of the left ICA. The microcatheter was then navigated over the wire into the left M2/MCA inferior division branch. Then, a 3 mm Trevo stent retriever was deployed spanning the M2 segment. The device was allowed to intercalated with the clot for 4 minutes. The microcatheter was removed. The aspiration catheter was advanced to the level of occlusion and connected to an aspiration pump. The thrombectomy device and aspiration catheter were removed under constant  aspiration. Left internal carotid artery angiogram with left anterior oblique and lateral views of the head showed recanalization of the inferior division (TICI 3). Luminal irregularity along the proximal M2/MCA inferior division branch with moderate stenosis is noted. Mild luminal irregularities are noted along the left MCA vascular tree, suggestive of intracranial atherosclerotic disease. Flat panel CT of the head was obtained and post processed in a separate workstation with concurrent attending physician supervision. Selected images were sent to PACS. Suboptimal quality of the CT due to motion. Left sylvian fissure hyperdensity noted, may represent subarachnoid hemorrhage versus contrast extravasation. The catheter was subsequently withdrawn. Right common femoral artery angiogram was obtained in right anterior oblique view. The puncture is at the level of the common femoral artery. The artery has normal caliber, adequate for closure device. The sheath was exchanged over the wire for a Perclose prostyle which was utilized for access closure. Immediate hemostasis was achieved. IMPRESSION: 1.  Successful mechanical thrombectomy for treatment of a left M1/MCA occlusion achieving complete recanalization. Underlying atherosclerotic disease noted with moderate stenosis of the left MCA inferior division branch. 2. Hyperdensity within the left CCA and fissure may represent contrast staining versus subarachnoid hemorrhage. PLAN: 1. Head CT will be obtained to better evaluate left sylvian fissure hyperdensity. 2. Transfer to ICU for continued post stroke care. Electronically Signed   By: Pedro Earls M.D.   On: 03/29/2022 12:39   IR US Guide Vasc Access Right  Result Date: 03/29/2022 INDICATION: 76 year old female with past medical history significant for gallstones, gestational diabetes, hyperlipidemia, hypertension, left bundle branch block status post pacemaker placement and hypotension. She  presented to [facility] with acute onset of right-sided hemiplegia and aphasia; NIHSS 23. Her last known well was 10 a.m. on 03/28/2022. Head CT showed no large acute territorial infarct or hemorrhage. Hyperdense left M1-M2 was noted. CT angiogram of the head and neck showed an occlusion of the left M1/MCA with poor collaterals. She received IV TNK and was then transferred to our service for a mechanical thrombectomy. EXAM: ULTRASOUND-GUIDED VASCULAR ACCESS DIAGNOSTIC CEREBRAL ANGIOGRAM MECHANICAL THROMBECTOMY FLAT PANEL HEAD CT COMPARISON:  CT/CT angiogram of the head and neck March 28, 2022. MEDICATIONS: No antibiotic was administered. ANESTHESIA/SEDATION: The procedure was performed under general anesthesia. CONTRAST:  150 mL of Omnipaque 300 milligram/mL FLUOROSCOPY: Radiation Exposure Index (as provided by the fluoroscopic device): 1,191 mGy Kerma COMPLICATIONS: SIR Level A - No therapy, no consequence. TECHNIQUE: Informed written consent was obtained from the patient's husband after a thorough discussion of the procedural risks, benefits and alternatives. All questions were addressed. Maximal Sterile Barrier Technique was utilized including caps, mask, sterile gowns, sterile gloves, sterile drape, hand hygiene and skin antiseptic. A timeout was performed prior to the initiation of the procedure. The right groin was prepped and draped in the usual sterile fashion. Using a micropuncture kit and the modified Seldinger technique, access was gained to the right common femoral artery and an 8 French sheath was placed. Real-time ultrasound guidance was utilized for vascular access including the acquisition of a permanent ultrasound image documenting patency of the accessed vessel. Under fluoroscopy, a Zoom 88 guide catheter was navigated over a 6 [place] VTK catheter and a 0.035" Terumo Glidewire into the aortic arch. The catheter was placed into the left common carotid artery and then advanced into the left internal  carotid artery. The diagnostic catheter was removed. Left anterior oblique angiograms of the head was obtained. FINDINGS: 1. Normal caliber of the right common femoral artery, adequate for vascular access. 2. Increase tortuosity of the upper cervical segment of the left ICA. 3. Occlusion of the proximal left M2/MCA. PROCEDURE: Using roadmap guidance, a Zoom 55 aspiration catheter was navigated over Colossus 35 microguidewire into the cavernous segment of the left ICA. The aspiration catheter was then advanced to the level of occlusion in the M1 segment and connected to an aspiration pump. Continuous aspiration was performed for 2 minutes. The guide catheter was connected to a VacLok syringe. The aspiration catheter was subsequently removed under constant aspiration. The guide catheter was aspirated for debris. Left internal carotid artery angiograms with left anterior oblique views of the head were obtained. Persistent occlusion of the left M1 segment was seen. Using roadmap guidance, a zoom 55 aspiration catheter was navigated over a phenom 21 microcatheter and a Aristotle 14 microguidewire into the cavernous segment of the left ICA. The microcatheter was then navigated over the wire into  the left M2/MCA superior division branch. Then, a 4 x 40 mm solitaire stent retriever was deployed spanning the M1-M2 segments. The device was allowed to intercalated with the clot for 4 minutes. The microcatheter was removed. The aspiration catheter was advanced to the level of occlusion and connected to an aspiration pump. The guiding catheter balloon was inflated. The thrombectomy device and aspiration catheter were removed under constant aspiration. Left internal carotid artery angiograms with left anterior oblique view showed recanalization of the superior division branch. Using roadmap guidance, a zoom 55 aspiration catheter was navigated over a phenom 21 microcatheter and a Aristotle 14 microguidewire into the cavernous  segment of the left ICA. The microcatheter was then navigated over the wire into the left M2/MCA superior division branch. Frontal and lateral angiograms were obtained via microcatheter contrast injection. The catheter was retracted into the M1 segment. Frontal and lateral angiograms were obtained via aspiration catheter contrast injection. Recanalization of the second superior division branch was noted. Using roadmap guidance, a 5 [place] Esper[place] aspiration catheter was navigated over a Prowler EX microcatheter and a synchro 2 microguidewire into the cavernous segment of the left ICA. The microcatheter was then navigated over the wire into the left M2/MCA inferior division branch. Then, a 3 x 40 mm solitaire stent retriever was deployed spanning the M2 segment. The device was allowed to intercalated with the clot for 4 minutes. The microcatheter was removed. The aspiration catheter was advanced to the level of occlusion and connected to an aspiration pump. The thrombectomy device and aspiration catheter were removed under constant aspiration. Left internal carotid or else with left anterior oblique view showed recanalization of the proximal aspect of the left M2/MCA inferior division branch with persistent occlusion distal to its origin. Using roadmap guidance, a 5 [place]per[place] aspiration catheter was navigated over a Prowler EX microcatheter and a synchro 2 microguidewire into the cavernous segment of the left ICA. The microcatheter was then navigated over the wire into the left M2/MCA inferior division branch. Then, a 3 mm Trevo stent retriever was deployed spanning the M2 segment. The device was allowed to intercalated with the clot for 4 minutes. The microcatheter was removed. The aspiration catheter was advanced to the level of occlusion and connected to an aspiration pump. The thrombectomy device and aspiration catheter were removed under constant aspiration. Left internal carotid artery angiogram with  left anterior oblique and lateral views of the head showed recanalization of the inferior division (TICI 3). Luminal irregularity along the proximal M2/MCA inferior division branch with moderate stenosis is noted. Mild luminal irregularities are noted along the left MCA vascular tree, suggestive of intracranial atherosclerotic disease. Flat panel CT of the head was obtained and post processed in a separate workstation with concurrent attending physician supervision. Selected images were sent to PACS. Suboptimal quality of the CT due to motion. Left sylvian fissure hyperdensity noted, may represent subarachnoid hemorrhage versus contrast extravasation. The catheter was subsequently withdrawn. Right common femoral artery angiogram was obtained in right anterior oblique view. The puncture is at the level of the common femoral artery. The artery has normal caliber, adequate for closure device. The sheath was exchanged over the wire for a Perclose prostyle which was utilized for access closure. Immediate hemostasis was achieved. IMPRESSION: 1. Successful mechanical thrombectomy for treatment of a left M1/MCA occlusion achieving complete recanalization. Underlying atherosclerotic disease noted with moderate stenosis of the left MCA inferior division branch. 2. Hyperdensity within the left CCA and fissure may represent contrast staining  versus subarachnoid hemorrhage. PLAN: 1. Head CT will be obtained to better evaluate left sylvian fissure hyperdensity. 2. Transfer to ICU for continued post stroke care. Electronically Signed   By: Pedro Earls M.D.   On: 03/29/2022 12:39   IR 3D Independent Darreld Mclean  Result Date: 03/29/2022 INDICATION: 76 year old female with past medical history significant for gallstones, gestational diabetes, hyperlipidemia, hypertension, left bundle branch block status post pacemaker placement and hypotension. She presented to [facility] with acute onset of right-sided hemiplegia and  aphasia; NIHSS 23. Her last known well was 10 a.m. on 03/28/2022. Head CT showed no large acute territorial infarct or hemorrhage. Hyperdense left M1-M2 was noted. CT angiogram of the head and neck showed an occlusion of the left M1/MCA with poor collaterals. She received IV TNK and was then transferred to our service for a mechanical thrombectomy. EXAM: ULTRASOUND-GUIDED VASCULAR ACCESS DIAGNOSTIC CEREBRAL ANGIOGRAM MECHANICAL THROMBECTOMY FLAT PANEL HEAD CT COMPARISON:  CT/CT angiogram of the head and neck March 28, 2022. MEDICATIONS: No antibiotic was administered. ANESTHESIA/SEDATION: The procedure was performed under general anesthesia. CONTRAST:  150 mL of Omnipaque 300 milligram/mL FLUOROSCOPY: Radiation Exposure Index (as provided by the fluoroscopic device): 7,672 mGy Kerma COMPLICATIONS: SIR Level A - No therapy, no consequence. TECHNIQUE: Informed written consent was obtained from the patient's husband after a thorough discussion of the procedural risks, benefits and alternatives. All questions were addressed. Maximal Sterile Barrier Technique was utilized including caps, mask, sterile gowns, sterile gloves, sterile drape, hand hygiene and skin antiseptic. A timeout was performed prior to the initiation of the procedure. The right groin was prepped and draped in the usual sterile fashion. Using a micropuncture kit and the modified Seldinger technique, access was gained to the right common femoral artery and an 8 French sheath was placed. Real-time ultrasound guidance was utilized for vascular access including the acquisition of a permanent ultrasound image documenting patency of the accessed vessel. Under fluoroscopy, a Zoom 88 guide catheter was navigated over a 6 [place] VTK catheter and a 0.035" Terumo Glidewire into the aortic arch. The catheter was placed into the left common carotid artery and then advanced into the left internal carotid artery. The diagnostic catheter was removed. Left anterior  oblique angiograms of the head was obtained. FINDINGS: 1. Normal caliber of the right common femoral artery, adequate for vascular access. 2. Increase tortuosity of the upper cervical segment of the left ICA. 3. Occlusion of the proximal left M2/MCA. PROCEDURE: Using roadmap guidance, a Zoom 55 aspiration catheter was navigated over Colossus 35 microguidewire into the cavernous segment of the left ICA. The aspiration catheter was then advanced to the level of occlusion in the M1 segment and connected to an aspiration pump. Continuous aspiration was performed for 2 minutes. The guide catheter was connected to a VacLok syringe. The aspiration catheter was subsequently removed under constant aspiration. The guide catheter was aspirated for debris. Left internal carotid artery angiograms with left anterior oblique views of the head were obtained. Persistent occlusion of the left M1 segment was seen. Using roadmap guidance, a zoom 55 aspiration catheter was navigated over a phenom 21 microcatheter and a Aristotle 14 microguidewire into the cavernous segment of the left ICA. The microcatheter was then navigated over the wire into the left M2/MCA superior division branch. Then, a 4 x 40 mm solitaire stent retriever was deployed spanning the M1-M2 segments. The device was allowed to intercalated with the clot for 4 minutes. The microcatheter was removed. The aspiration catheter was  advanced to the level of occlusion and connected to an aspiration pump. The guiding catheter balloon was inflated. The thrombectomy device and aspiration catheter were removed under constant aspiration. Left internal carotid artery angiograms with left anterior oblique view showed recanalization of the superior division branch. Using roadmap guidance, a zoom 55 aspiration catheter was navigated over a phenom 21 microcatheter and a Aristotle 14 microguidewire into the cavernous segment of the left ICA. The microcatheter was then navigated over the  wire into the left M2/MCA superior division branch. Frontal and lateral angiograms were obtained via microcatheter contrast injection. The catheter was retracted into the M1 segment. Frontal and lateral angiograms were obtained via aspiration catheter contrast injection. Recanalization of the second superior division branch was noted. Using roadmap guidance, a 5 [place]per[place] aspiration catheter was navigated over a Prowler EX microcatheter and a synchro 2 microguidewire into the cavernous segment of the left ICA. The microcatheter was then navigated over the wire into the left M2/MCA inferior division branch. Then, a 3 x 40 mm solitaire stent retriever was deployed spanning the M2 segment. The device was allowed to intercalated with the clot for 4 minutes. The microcatheter was removed. The aspiration catheter was advanced to the level of occlusion and connected to an aspiration pump. The thrombectomy device and aspiration catheter were removed under constant aspiration. Left internal carotid or else with left anterior oblique view showed recanalization of the proximal aspect of the left M2/MCA inferior division branch with persistent occlusion distal to its origin. Using roadmap guidance, a 5 [place] aspiration catheter was navigated over a Prowler EX microcatheter and a synchro 2 microguidewire into the cavernous segment of the left ICA. The microcatheter was then navigated over the wire into the left M2/MCA inferior division branch. Then, a 3 mm Trevo stent retriever was deployed spanning the M2 segment. The device was allowed to intercalated with the clot for 4 minutes. The microcatheter was removed. The aspiration catheter was advanced to the level of occlusion and connected to an aspiration pump. The thrombectomy device and aspiration catheter were removed under constant aspiration. Left internal carotid artery angiogram with left anterior oblique and lateral views of the head showed  recanalization of the inferior division (TICI 3). Luminal irregularity along the proximal M2/MCA inferior division branch with moderate stenosis is noted. Mild luminal irregularities are noted along the left MCA vascular tree, suggestive of intracranial atherosclerotic disease. Flat panel CT of the head was obtained and post processed in a separate workstation with concurrent attending physician supervision. Selected images were sent to PACS. Suboptimal quality of the CT due to motion. Left sylvian fissure hyperdensity noted, may represent subarachnoid hemorrhage versus contrast extravasation. The catheter was subsequently withdrawn. Right common femoral artery angiogram was obtained in right anterior oblique view. The puncture is at the level of the common femoral artery. The artery has normal caliber, adequate for closure device. The sheath was exchanged over the wire for a Perclose prostyle which was utilized for access closure. Immediate hemostasis was achieved. IMPRESSION: 1. Successful mechanical thrombectomy for treatment of a left M1/MCA occlusion achieving complete recanalization. Underlying atherosclerotic disease noted with moderate stenosis of the left MCA inferior division branch. 2. Hyperdensity within the left CCA and fissure may represent contrast staining versus subarachnoid hemorrhage. PLAN: 1. Head CT will be obtained to better evaluate left sylvian fissure hyperdensity. 2. Transfer to ICU for continued post stroke care. Electronically Signed   By: Pedro Earls M.D.   On: 03/29/2022  12:39   DG Abd 1 View  Result Date: 03/28/2022 CLINICAL DATA:  Check gastric catheter placement EXAM: ABDOMEN - 1 VIEW COMPARISON:  None Available. FINDINGS: Gastric catheter is noted coiled within the stomach. Contrast is noted within the kidneys from prior arteriogram. No free air is seen. IMPRESSION: Gastric catheter within the stomach. Electronically Signed   By: Inez Catalina M.D.   On:  03/28/2022 21:57   CT HEAD WO CONTRAST (5MM)  Result Date: 03/28/2022 CLINICAL DATA:  Assess stability of hemorrhage EXAM: CT HEAD WITHOUT CONTRAST TECHNIQUE: Contiguous axial images were obtained from the base of the skull through the vertex without intravenous contrast. RADIATION DOSE REDUCTION: This exam was performed according to the departmental dose-optimization program which includes automated exposure control, adjustment of the mA and/or kV according to patient size and/or use of iterative reconstruction technique. COMPARISON:  03/28/2022 4:20 p.m. FINDINGS: Brain: Redemonstrated hyperdensity involving the left basal ganglia, which is slightly decreased in conspicuity and favored to represent contrast staining rather than hemorrhage. Additional subarachnoid hyperdensity along the left cerebral hemisphere, particularly in the left sylvian fissure, is also decreased in conspicuity and may represent a combination of extravasated contrast and subarachnoid hemorrhage. No definite acute infarct, mass, or midline shift. Sulcal effacement in the left cerebral hemisphere, particularly the sylvian fissure, with increased prominence of the caudate compared to the earliest same day CT, likely reflecting edema and mild mass effect. Mild narrowing of the left lateral ventricle frontal horn. No hydrocephalus. Vascular: Contrast remains in the vascular system. No definite hyperdense vessel. Skull: Normal. Negative for fracture or focal lesion. Sinuses/Orbits: No acute finding. Other: The mastoids are well aerated. IMPRESSION: 1. Hyperdensity in the left basal ganglia is less conspicuous on this exam, favoring contrast staining rather than hemorrhage. 2. Subarachnoid hyperdensity along the left cerebral hemisphere is also slightly decreased in conspicuity and may represent a combination of subarachnoid hemorrhage and extravasated contrast. 3. Sulcal effacement and local mass effect in the left cerebral hemisphere, most  likely edema, without midline shift. Electronically Signed   By: Merilyn Baba M.D.   On: 03/28/2022 20:02   DG CHEST PORT 1 VIEW  Result Date: 03/28/2022 CLINICAL DATA:  Intubation. EXAM: PORTABLE CHEST 1 VIEW COMPARISON:  07/25/2014 FINDINGS: Endotracheal tube tip is 12 mm from the carina, mildly low in positioning. Enteric tube is in place with tip below the diaphragm, not included in the field of view. Probable temperature probe in place. Left-sided pacemaker in place. Low lung volumes. Chronic cardiomegaly, unchanged. No pulmonary edema, focal airspace disease, large pleural effusion or pneumothorax. IMPRESSION: 1. Endotracheal tube tip 12 mm from the carina, mildly low in positioning. Recommend retraction of 1-2 cm for optimal placement. Enteric tube in place with tip below the diaphragm, not included in the field of view. 2. Low lung volumes with chronic cardiomegaly. Electronically Signed   By: Keith Rake M.D.   On: 03/28/2022 18:06   CT HEAD WO CONTRAST (5MM)  Addendum Date: 03/28/2022   ADDENDUM REPORT: 03/28/2022 17:13 ADDENDUM: These results were called by telephone at the time of interpretation on 03/28/2022 at 5:00 pm to provider [facility] , who verbally acknowledged these results. Electronically Signed   By: Kellie Simmering D.O.   On: 03/28/2022 17:13   Result Date: 03/28/2022 CLINICAL DATA:  Provided history: Neuro deficit, acute, stroke suspected. EXAM: CT HEAD WITHOUT CONTRAST TECHNIQUE: Contiguous axial images were obtained from the base of the skull through the vertex without intravenous contrast. RADIATION DOSE REDUCTION:  This exam was performed according to the departmental dose-optimization program which includes automated exposure control, adjustment of the mA and/or kV according to patient size and/or use of iterative reconstruction technique. COMPARISON:  Non-contrast head CT 03/28/2022. CT angiogram head/neck 03/28/2022. FINDINGS: Brain: Mild generalized parenchymal atrophy.  Hyperdensity within the left caudate and lentiform nuclei, which may reflect contrast staining and/or hemorrhage. No significant associated mass effect at this time. Additionally, there is moderate-volume hyperdensity along the left cerebral hemisphere, most notably within the sylvian fissure. This likely reflects a combination of subarachnoid hemorrhage and extravasated contrast. Findings have progressed from the postprocedural head CT performed earlier today at 3:29 p.m. Mild patchy and ill-defined hypoattenuation within the cerebral white matter, nonspecific but compatible with chronic small vessel ischemic disease. Redemonstrated small chronic appearing infarct within the left cerebellar hemisphere. No midline shift or hydrocephalus. Vascular: Residual circulating contrast limits evaluation for hyperdense vessels. The thrombus/embolus which was previously present within the M1 left middle cerebral artery now appears to have migrated more distally into an M2 left MCA vessel (series 3, image 15). Skull: Normal. Negative for fracture or focal lesion. Sinuses/Orbits: No mass or acute finding within the imaged orbits. Mild mucosal thickening within the bilateral ethmoid air cells. Attempts are being made to reach the ordering provider at this time. IMPRESSION: Hyperdensity within the left caudate and lentiform nuclei, which may reflect contrast staining and/or hemorrhage. Moderate-volume hyperdensity along the left cerebral hemisphere, likely reflecting a combination of extravasated contrast and subarachnoid hemorrhage. Findings have progressed from the post-procedure head CT performed earlier today at 3:29 p.m. There is no significant associated mass effect at this time. No evidence of hydrocephalus. The thrombus/embolus which was previously present within the M1 left middle cerebral artery now appears to have migrated more distally into an M2 left MCA vessel. Electronically Signed: By: Kellie Simmering D.O. On:  03/28/2022 16:45   CT ANGIO HEAD NECK W WO CM (CODE STROKE)  Result Date: 03/28/2022 CLINICAL DATA:  Right-sided deficits, aphasia, concern for left-sided stroke EXAM: CT ANGIOGRAPHY HEAD AND NECK TECHNIQUE: Multidetector CT imaging of the head and neck was performed using the standard protocol during bolus administration of intravenous contrast. Multiplanar CT image reconstructions and MIPs were obtained to evaluate the vascular anatomy. Carotid stenosis measurements (when applicable) are obtained utilizing NASCET criteria, using the distal internal carotid diameter as the denominator. RADIATION DOSE REDUCTION: This exam was performed according to the departmental dose-optimization program which includes automated exposure control, adjustment of the mA and/or kV according to patient size and/or use of iterative reconstruction technique. CONTRAST:  26m OMNIPAQUE IOHEXOL 350 MG/ML SOLN COMPARISON:  No prior CTA, correlation is made with CT head 03/28/2022 FINDINGS: CT HEAD FINDINGS For noncontrast findings, please see same day CT head. CTA NECK FINDINGS Aortic arch: Two-vessel arch with a common origin of the brachiocephalic and left common carotid arteries. Imaged portion shows no evidence of aneurysm or dissection. No significant stenosis of the major arch vessel origins. Aortic atherosclerosis. Right carotid system: No evidence of dissection, occlusion, or hemodynamically significant stenosis (greater than 50%). Atherosclerotic disease at the bifurcation and in the proximal ICA is not hemodynamically significant. Left carotid system: No evidence of dissection, occlusion, or hemodynamically significant stenosis (greater than 50%). Atherosclerotic disease at the bifurcation and in the proximal ICA is not hemodynamically significant. Vertebral arteries: Mild narrowing in the proximal right V1 segment. The vertebral arteries are otherwise patent to the skull base. Skeleton: No acute osseous abnormality. Other  neck: No acute  finding. Upper chest: No focal pulmonary opacity or pleural effusion. Review of the MIP images confirms the above findings CTA HEAD FINDINGS Anterior circulation: Both internal carotid arteries are patent to the termini, without significant stenosis. A1 segments patent. Normal anterior communicating artery. Anterior cerebral arteries are patent to their distal aspects. Occlusion of the distal left M1 (series 7, image 92) with minimal opacification in the proximal left M2 branches (series 7, image 90 and 93) but little to no opacification past the origins or in the remainder of the left MCA branches. Mild stenosis in the distal right M1 with mild-to-moderate stenosis in the proximal more superior right M2 branch. Right MCA branches otherwise perfused. Posterior circulation: Vertebral arteries patent to the vertebrobasilar junction with mild stenosis in the mid left V4 (series 7, image 152 posterior inferior cerebellar arteries patent proximally. Basilar patent to its distal aspect. Superior cerebellar arteries patent proximally. Patent P1 segments, with moderate stenosis in the proximal right P1 (series 7, image 100-101). Long segment moderate stenosis in the right P2 (series 7, images 101-106) and poor perfusion of the right P3 segments (series 7, images 102 and 104). Mild multifocal stenosis in the left P2 (series 7, image 105). Severe stenosis at the origin of the more lateral left P3 (series 7, images 101-104). The bilateral posterior communicating arteries are not visualized. Venous sinuses: As permitted by contrast timing, patent. Anatomic variants: None significant. Review of the MIP images confirms the above findings IMPRESSION: 1. Occlusion of the left distal M1 with minimal flow in the proximal left M2 branches and poor perfusion of the remainder of the left MCA territory. This correlates with the hyperdensity seen on the same-day CT head. 2. Multifocal stenosis in the bilateral PCAs, which  is severe at the origin of one of the left P3 branches, moderate in the proximal right P1 and throughout the P2, and mild in the left P2. Poor perfusion of the right P3 segments. 3. Additional mild stenosis in the distal right M1, proximal M2, and in the left V4. 4. Mild stenosis of the proximal right V1 segment. No other hemodynamically significant stenosis in the neck. Code stroke imaging results were communicated on 03/28/2022 at 1:44 pm to provider Dr. Cheral Marker via telephone, who verbally acknowledged these results. Electronically Signed   By: Merilyn Baba M.D.   On: 03/28/2022 14:04   CT HEAD CODE STROKE WO CONTRAST  Result Date: 03/28/2022 CLINICAL DATA:  Code stroke. EXAM: CT HEAD WITHOUT CONTRAST TECHNIQUE: Contiguous axial images were obtained from the base of the skull through the vertex without intravenous contrast. RADIATION DOSE REDUCTION: This exam was performed according to the departmental dose-optimization program which includes automated exposure control, adjustment of the mA and/or kV according to patient size and/or use of iterative reconstruction technique. COMPARISON:  None Available. FINDINGS: Brain: No evidence of acute infarction, hemorrhage, hydrocephalus, extra-axial collection or mass lesion/mass effect. Vascular: Hyperdensity in the left MCA, near the bifurcation and in the proximal M2 branches (series 5, image 25-27), which may represent atherosclerotic disease and/or thrombus. Skull: Normal. Negative for fracture or focal lesion. Sinuses/Orbits: No acute finding. Other: The mastoids are well aerated. ASPECTS [facility] Stroke Program Early CT Score) - Ganglionic level infarction (caudate, lentiform nuclei, internal capsule, insula, M1-M3 cortex): 7 - Supraganglionic infarction (M4-M6 cortex): 3 Total score (0-10 with 10 being normal): 10 IMPRESSION: 1. Hyperdensity in the left MCA near the bifurcation and in the proximal M2 branches, which may represent atherosclerotic disease and/or  thrombus. 2. ASPECTS  is 10 Code stroke imaging results were communicated on 03/28/2022 at 1:14 pm to provider Dr. Cheral Marker via telephone, who verbally acknowledged these results. Electronically Signed   By: Merilyn Baba M.D.   On: 03/28/2022 13:17       HISTORY OF PRESENT ILLNESS Ms. NATIKA GEYER is a 76 y.o. female with history of gallstones, HLD, HTN LBBB s/p pacemaker placement and hypotension, presenting to the ED via EMS with acute onset of right hemiplegia and aphasia presenting with via EMS after being found slumped towards the right on the couch by her husband. She received TNK and underwent a mechanical thrombectomy for a proximal left M1 occlusion. Repeat CT Head showed extensive stroke in the left MCA territory with cytotoxic edema and mild midline shift. Moved to step down 9/21. Repeat CT 9/22- No progression of large left MCA territory infarct with petechial and subarachnoid hemorrhage.   HOSPITAL COURSE Stroke:  Left MCA infarct due to left M1 occlusion s/p TNK and IR with TICI3 complicated by small SAH, etiology unclear  Code Stroke CT head Hyperdensity in the left MCA near the bifurcation and in the proximal M2 branches CTA head & neck Occlusion of the left distal M1 with minimal flow in the proximal left M2 branches and poor perfusion of the remainder of the left MCA territory. Multifocal stenosis in the bilateral PCAs, which is severe at the origin of one of the left P3 branches, moderate in the proximal right P1 and throughout the P2, and mild in the left P2. Poor perfusion of the right P3 segments. IR with TICI3 after 4 passes Post IR CT Small SAH and contrast pending x 2 Not able to have MRI due to pacer incompatible CT repeat 9/19 Increased edema in left MCA territory with no midline shift, likely small SAH in left sylvian fissure and left cerebral sulci 2D Echo 60-65%- normal LV function Pacemaker interrogation showed only 10 seconds of a flutter  LDL 104 HgbA1c 5.5 VTE  prophylaxis - SCDs No antithrombotic prior to admission, now on aspirin 81 mg and plavix DAPTx 3 weeks and then aspirin alone Therapy recommendations: CIR on Monday  Disposition:  pending  Respiratory failure Intubated before procedure Remain intubated after procedure due to mental status Extubated 9/19 after CT repeat Mild respiratory distress with secretions, no gag or cough High risk for reintubation Close ICU monitoring  Paroxysmal brief Aflutter Pacemaker interrogation showed a 10 seconds of Aflutter Discussed with card EP, does not think enough evidence for anticoagulation Antiplatelet therapy for now Continue pacemaker monitoring  Hypertension Home meds:  Coreg 62m Stable BP goal < 160 Long-term BP goal normotensive  Hyperlipidemia LDL 104, goal < 70 Resume atorvastatin 469m Continue statin at discharge  Dysphagia Did not pass a swallow Placed core track, on tube feeding @ 45 Speech on board  Other Stroke Risk Factors Advanced Age >/= 6518Obesity, Body mass index is 33.6 kg/m., BMI >/= 30 associated with increased stroke risk, recommend weight loss, diet and exercise as appropriate   Other Active Problems LBBB on pacer  DISCHARGE EXAM Blood pressure (!) 150/59, pulse 67, temperature 98.8 F (37.1 C), temperature source Oral, resp. rate 16, height '5\' 4"'  (1.626 m), weight 88.8 kg, SpO2 96 %. Physical Exam  Constitutional: Appears well-developed and well-nourished.   Cardiovascular: Normal rate and regular rhythm.  Respiratory: Effort normal, non-labored breathing   NEURO:  Mental Status: Awake, alert today Speech/Language: speech dysarthric with both receptive and expressive aphasia.  Can  intermittently follow commands and repeat short phrases  Cranial Nerves:  II: PERRL. Does not blink to threat on the right III, IV, VI: left gaze deviation, cannot cross midline  VII: slight right facial droop   VIII: hearing intact to voice. XII: tongue is midline  without fasciculations. Motor: 5/5 strength to LUE and LLE, 0/5 to RUE and 2/5 to RLE  Sensation- Appears intact to light touch bilaterally.  Gait- deferred  Discharge Diet      Diet   Diet NPO time specified Except for: Ice Chips   liquids  DISCHARGE PLAN Disposition:  Transfer to [facility] for ongoing PT, OT and ST aspirin 81 mg daily and clopidogrel 75 mg daily for secondary stroke prevention for 3 weeks then ASA69m alone. Recommend ongoing stroke risk factor control by Primary Care Physician at time of discharge from inpatient rehabilitation. Follow-up PCP GKelton Pillar MD in 2 weeks following discharge from rehab. [facility] weeks following discharge from rehab, office to schedule an appointment.   34 minutes were spent preparing discharge.  Patient seen and examined by NP/APP with MD. MD to update note as needed.   DJanine Ores DNP, FNP-BC [facility] Pager: [phone] have personally obtained history,examined this patient, reviewed notes, independently viewed imaging studies, participated in medical decision making and plan of care.ROS completed by me personally and pertinent positives fully documented  I have made any additions or clarifications directly to the above note. Agree with note above.    PAntony Contras MD Medical Director [facility] Pager: [phone]/27/2023 6:31 PM    To contact Stroke Continuity provider, please refer to [email or web address] After hours, contact General Neurology

## 2022-04-06 NOTE — Progress Notes (Signed)
Orthopedic Tech Progress Note Patient Details:  Elaine Ford 1946-01-26 [id number]  Called in order to [facility] for a resting hand splint   Patient ID: Elaine Ford, female   DOB: 1946/03/19, 76 y.o.   MRN: [id number]  Janit Pagan 04/06/2022, 5:24 PM

## 2022-04-06 NOTE — Progress Notes (Signed)
Patient arrived on unit, oriented to unit. Reviewed therapy schedule, rehab routine and plan of care with patient and son. Son states an understanding of information reviewed. Patient is aphasic. Elaine Ford

## 2022-04-07 ENCOUNTER — Inpatient Hospital Stay (HOSPITAL_COMMUNITY): Payer: Medicare HMO

## 2022-04-07 ENCOUNTER — Ambulatory Visit (HOSPITAL_COMMUNITY): Payer: Medicare HMO | Attending: Physical Medicine and Rehabilitation

## 2022-04-07 DIAGNOSIS — D649 Anemia, unspecified: Secondary | ICD-10-CM | POA: Diagnosis not present

## 2022-04-07 DIAGNOSIS — R1310 Dysphagia, unspecified: Secondary | ICD-10-CM | POA: Diagnosis not present

## 2022-04-07 DIAGNOSIS — R269 Unspecified abnormalities of gait and mobility: Secondary | ICD-10-CM | POA: Diagnosis not present

## 2022-04-07 DIAGNOSIS — M79605 Pain in left leg: Secondary | ICD-10-CM | POA: Insufficient documentation

## 2022-04-07 DIAGNOSIS — D72829 Elevated white blood cell count, unspecified: Secondary | ICD-10-CM

## 2022-04-07 DIAGNOSIS — M79604 Pain in right leg: Secondary | ICD-10-CM | POA: Insufficient documentation

## 2022-04-07 DIAGNOSIS — I63512 Cerebral infarction due to unspecified occlusion or stenosis of left middle cerebral artery: Secondary | ICD-10-CM | POA: Diagnosis not present

## 2022-04-07 LAB — VITAMIN D 25 HYDROXY (VIT D DEFICIENCY, FRACTURES): Vit D, 25-Hydroxy: 32.96 ng/mL (ref 30–100)

## 2022-04-07 LAB — CBC WITH DIFFERENTIAL/PLATELET
Abs Immature Granulocytes: 0.04 10*3/uL (ref 0.00–0.07)
Basophils Absolute: 0 10*3/uL (ref 0.0–0.1)
Basophils Relative: 0 %
Eosinophils Absolute: 0.1 10*3/uL (ref 0.0–0.5)
Eosinophils Relative: 1 %
HCT: 37.8 % (ref 36.0–46.0)
Hemoglobin: 12.4 g/dL (ref 12.0–15.0)
Immature Granulocytes: 0 %
Lymphocytes Relative: 9 %
Lymphs Abs: 1.1 10*3/uL (ref 0.7–4.0)
MCH: 27.8 pg (ref 26.0–34.0)
MCHC: 32.8 g/dL (ref 30.0–36.0)
MCV: 84.8 fL (ref 80.0–100.0)
Monocytes Absolute: 0.9 10*3/uL (ref 0.1–1.0)
Monocytes Relative: 7 %
Neutro Abs: 10.3 10*3/uL — ABNORMAL HIGH (ref 1.7–7.7)
Neutrophils Relative %: 83 %
Platelets: 307 10*3/uL (ref 150–400)
RBC: 4.46 MIL/uL (ref 3.87–5.11)
RDW: 14.5 % (ref 11.5–15.5)
WBC: 12.5 10*3/uL — ABNORMAL HIGH (ref 4.0–10.5)
nRBC: 0 % (ref 0.0–0.2)

## 2022-04-07 LAB — COMPREHENSIVE METABOLIC PANEL
ALT: 20 U/L (ref 0–44)
AST: 31 U/L (ref 15–41)
Albumin: 2.9 g/dL — ABNORMAL LOW (ref 3.5–5.0)
Alkaline Phosphatase: 86 U/L (ref 38–126)
Anion gap: 12 (ref 5–15)
BUN: 19 mg/dL (ref 8–23)
CO2: 26 mmol/L (ref 22–32)
Calcium: 9.3 mg/dL (ref 8.9–10.3)
Chloride: 99 mmol/L (ref 98–111)
Creatinine, Ser: 0.76 mg/dL (ref 0.44–1.00)
GFR, Estimated: >60 mL/min (ref >60)
Glucose, Bld: 149 mg/dL — ABNORMAL HIGH (ref 70–99)
Potassium: 4.2 mmol/L (ref 3.5–5.1)
Sodium: 137 mmol/L (ref 135–145)
Total Bilirubin: 1.2 mg/dL (ref 0.3–1.2)
Total Protein: 6.9 g/dL (ref 6.5–8.1)

## 2022-04-07 LAB — URINALYSIS, ROUTINE W REFLEX MICROSCOPIC
Bilirubin Urine: NEGATIVE
Glucose, UA: NEGATIVE mg/dL
Ketones, ur: NEGATIVE mg/dL
Nitrite: NEGATIVE
Protein, ur: 30 mg/dL — AB
RBC / HPF: >50 RBC/hpf — ABNORMAL HIGH (ref 0–5)
Specific Gravity, Urine: 1.018 (ref 1.005–1.030)
WBC, UA: >50 WBC/hpf — ABNORMAL HIGH (ref 0–5)
pH: 7 (ref 5.0–8.0)

## 2022-04-07 LAB — GLUCOSE, CAPILLARY
Glucose-Capillary: 154 mg/dL — ABNORMAL HIGH (ref 70–99)
Glucose-Capillary: 145 mg/dL — ABNORMAL HIGH (ref 70–99)
Glucose-Capillary: 132 mg/dL — ABNORMAL HIGH (ref 70–99)
Glucose-Capillary: 107 mg/dL — ABNORMAL HIGH (ref 70–99)
Glucose-Capillary: 118 mg/dL — ABNORMAL HIGH (ref 70–99)

## 2022-04-07 MED ORDER — MAGNESIUM GLUCONATE 500 MG PO TABS
250.0000 mg | ORAL_TABLET | Freq: Every day | ORAL | Status: DC
  Administered 2022-04-07 – 2022-04-17 (×11): 250 mg via ORAL
  Filled 2022-04-07 (×11): qty 1

## 2022-04-07 NOTE — Progress Notes (Signed)
[facility] Individual Statement of Services  Patient Name:  [name]  Date:  04/07/2022  Welcome to the [facility].  Our goal is to provide you with an individualized program based on your diagnosis and situation, designed to meet your specific needs.  With this comprehensive rehabilitation program, you will be expected to participate in at least 3 hours of rehabilitation therapies Monday-Friday, with modified therapy programming on the weekends.  Your rehabilitation program will include the following services:  Physical Therapy (PT), Occupational Therapy (OT), Speech Therapy (ST), 24 hour per day rehabilitation nursing, Therapeutic Recreaction (TR), Neuropsychology, Care Coordinator, Rehabilitation Medicine, Nutrition Services, and Pharmacy Services  Weekly team conferences will be held on Wednesday to discuss your progress.  Your Inpatient Rehabilitation Care Coordinator will talk with you frequently to get your input and to update you on team discussions.  Team conferences with you and your family in attendance may also be held.  Expected length of stay: 2-3 weeks  Overall anticipated outcome: CGA-min level  Depending on your progress and recovery, your program may change. Your Inpatient Rehabilitation Care Coordinator will coordinate services and will keep you informed of any changes. Your Inpatient Rehabilitation Care Coordinator's name and contact numbers are listed  below.  The following services may also be recommended but are not provided by the [facility] will be made to provide these services after discharge if needed.  Arrangements include referral to agencies that provide these services.  Your insurance has been verified to be:  [employer] Your primary doctor is:  [name]  Pertinent  information will be shared with your doctor and your insurance company.  Inpatient Rehabilitation Care Coordinator:  Ovidio Kin, [place] or Emilia Beck  Information discussed with and copy given to patient by: Elease Hashimoto, 04/07/2022, 12:47 PM

## 2022-04-07 NOTE — Plan of Care (Signed)
  Problem: RH Balance Goal: LTG: Patient will maintain dynamic sitting balance (OT) Description: LTG:  Patient will maintain dynamic sitting balance with assistance during activities of daily living (OT) Flowsheets (Taken 04/07/2022 1655) LTG: Pt will maintain dynamic sitting balance during ADLs with: Minimal Assistance - Patient > 75%   Problem: RH Grooming Goal: LTG Patient will perform grooming w/assist,cues/equip (OT) Description: LTG: Patient will perform grooming with assist, with/without cues using equipment (OT) Flowsheets (Taken 04/07/2022 1655) LTG: Pt will perform grooming with assistance level of: Minimal Assistance - Patient > 75%   Problem: RH Bathing Goal: LTG Patient will bathe all body parts with assist levels (OT) Description: LTG: Patient will bathe all body parts with assist levels (OT) Flowsheets (Taken 04/07/2022 1655) LTG: Pt will perform bathing with assistance level/cueing: Minimal Assistance - Patient > 75% LTG: Position pt will perform bathing: Shower   Problem: RH Dressing Goal: LTG Patient will perform upper body dressing (OT) Description: LTG Patient will perform upper body dressing with assist, with/without cues (OT). Flowsheets (Taken 04/07/2022 1655) LTG: Pt will perform upper body dressing with assistance level of: Minimal Assistance - Patient > 75% Goal: LTG Patient will perform lower body dressing w/assist (OT) Description: LTG: Patient will perform lower body dressing with assist, with/without cues in positioning using equipment (OT) Flowsheets (Taken 04/07/2022 1655) LTG: Pt will perform lower body dressing with assistance level of: Minimal Assistance - Patient > 75%   Problem: RH Toileting Goal: LTG Patient will perform toileting task (3/3 steps) with assistance level (OT) Description: LTG: Patient will perform toileting task (3/3 steps) with assistance level (OT)  Flowsheets (Taken 04/07/2022 1655) LTG: Pt will perform toileting task (3/3 steps) with  assistance level: Minimal Assistance - Patient > 75%   Problem: RH Functional Use of Upper Extremity Goal: LTG Patient will use RT/LT upper extremity as a (OT) Description: LTG: Patient will use right/left upper extremity as a stabilizer/gross assist/diminished/nondominant/dominant level with assist, with/without cues during functional activity (OT) Flowsheets (Taken 04/07/2022 1655) LTG: Use of upper extremity in functional activities: RUE as diminished level LTG: Pt will use upper extremity in functional activity with assistance level of: Contact Guard/Touching assist   Problem: RH Tub/Shower Transfers Goal: LTG Patient will perform tub/shower transfers w/assist (OT) Description: LTG: Patient will perform tub/shower transfers with assist, with/without cues using equipment (OT) Flowsheets (Taken 04/07/2022 1655) LTG: Pt will perform tub/shower stall transfers with assistance level of: Minimal Assistance - Patient > 75% LTG: Pt will perform tub/shower transfers from: Tub/shower combination

## 2022-04-07 NOTE — Progress Notes (Signed)
Inpatient Rehabilitation Care Coordinator Assessment and Plan Patient Details  Name: Elaine Ford MRN: [id number] Date of Birth: 1946-07-10  Today's Date: 04/07/2022  Hospital Problems: Principal Problem:   Acute ischemic left middle cerebral artery (MCA) stroke [facility])  Past Medical History:  Past Medical History:  Diagnosis Date   Back pain    Broken arm    age 76 years old broke right arm after a fall   Gall bladder stones    Gestational diabetes    Hemorrhoid    Hx of cardiac cath    [facility] 07/24/14 at [facility] - no significant CAD   Hx of cardiovascular stress test    Myoview 11/14:  No ischemia   Hx of echocardiogram    Echo 07/23/14 - at [facility]:  EF 65-70%, impaired relaxation, mild LAE, mild MR, mild AI, mild elevated PASP (44.2 mmHg)   Hyperlipidemia    Hypertension    LBBB (left bundle branch block)    Low blood pressure    Nerve damage    right arm   Pain    Left upper chest s/p pacer maker insertion site 07/2014   S/P cardiac pacemaker procedure 07/2014   St. Jude - Dr. Lovena Le   Stroke [facility])    patient was unaware noted prior to pacermaker placement unknown when it occurred   Past Surgical History:  Past Surgical History:  Procedure Laterality Date   COLONOSCOPY     DILATATION & CURETTAGE/HYSTEROSCOPY WITH MYOSURE N/A 02/29/2016   Procedure: DILATATION & CURETTAGE/HYSTEROSCOPY WITH MYOSURE;  Surgeon: [name], DO;  Location: [place] [facility];  Service: Gynecology;  Laterality: N/A;  patient has a pacemaker    GALLBLADDER SURGERY     IR 3D INDEPENDENT WKST  03/28/2022   IR PERCUTANEOUS ART THROMBECTOMY/INFUSION INTRACRANIAL INC DIAG ANGIO  03/28/2022   IR US GUIDE VASC ACCESS RIGHT  03/28/2022   LAPAROSCOPIC BILATERAL SALPINGO OOPHERECTOMY  04/20/2016   Procedure: LAPAROSCOPIC BILATERAL SALPINGO OOPHORECTOMY;  Surgeon: [name], DO;  Location: [place] [facility];  Service: Gynecology;;   LEAD REVISION N/A 07/25/2014   Procedure: LEAD REVISION;  Surgeon: Deboraha Sprang, MD;  Location:  [facility] CATH LAB;  Service: Cardiovascular;  Laterality: N/A;   PERMANENT PACEMAKER INSERTION N/A 07/24/2014   Procedure: PERMANENT PACEMAKER INSERTION;  Surgeon: [name], MD;  Location: [facility] CATH LAB;  Service: Cardiovascular;  Laterality: N/A;   RADIOLOGY WITH ANESTHESIA N/A 03/28/2022   Procedure: IR WITH ANESTHESIA;  Surgeon: Radiologist, Medication, MD;  Location: [place];  Service: Radiology;  Laterality: N/A;   VAGINAL HYSTERECTOMY  04/20/2016   Procedure: HYSTERECTOMY VAGINAL;  Surgeon: [name], DO;  Location: [place] [facility];  Service: Gynecology;;   Social History:  reports that she has never smoked. She has never used smokeless tobacco. She reports that she does not drink alcohol and does not use drugs.  Family / Support Systems Marital Status: Married Patient Roles: Spouse, Parent Spouse/Significant Other: Cory Roughen [phone] Children: Son here from [place] for a short time Other Supports: Friends and church members Anticipated Caregiver: Husband Ability/Limitations of Caregiver: retired-no limitations Caregiver Availability: 24/7 Family Dynamics: Close knit family who will pull together to assist both pt and thier Dad. Husband is in good health and will be her primary caregiver at discharge, he plans to be here daily. He reports they have good supports but family is out of the state.  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 Will look toward her  husband for any decisions while here   Abuse/Neglect Abuse/Neglect Assessment Can Be Completed: Unable to assess, patient is non-responsive or altered mental status  Patient response to: Social Isolation - How often do you feel lonely or isolated from those around you?: Never (according to husband)  Emotional Status Pt's affect, behavior and adjustment status: Husband provided the information due to pt unable to at this time. He reports she was independent and drove and did all for herself. She takes care of the home and is active in the commuity. Recent Psychosocial Issues: was doing well prior to this CVA. Psychiatric History: No history Elaine benefit from seeing neuro-psych while here for coping due to losses. Substance Abuse History: No issues  Patient / Family Perceptions, Expectations & Goals Pt/Family understanding of illness & functional limitations: Husband can explain her stroke and the deficits she has, he has spoken with the MD and feels he has a good understanding of her treatment plan moving forward and plans to be here daily to support her and see her progress. Premorbid pt/family roles/activities: Wife, mom, retiree, church member, neighbor, Social research officer, government Anticipated changes in roles/activities/participation: resume Pt/family expectations/goals: Pt does make eye contact and says thank you and hello. Husband states: " I hope she makes progress heer and can regain some of her independence before going home."  [employer]: None Premorbid Home Care/DME Agencies: Other (Comment) (cane and tub seat) Transportation available at discharge: both she and husband Is the patient able to respond to transportation needs?: Yes In the past 12 months, has lack of transportation kept you from medical appointments or from getting medications?: No In the past 12 months, has lack of transportation kept you from meetings, work, or from getting things needed for daily living?:  No Resource referrals recommended: Neuropsychology  Discharge Planning Living Arrangements: Spouse/significant other Support Systems: Spouse/significant other, Children, Other relatives, Friends/neighbors, Church/faith community Type of Residence: Private residence Insurance Resources: Multimedia programmer (specify) Actor) Financial Resources: Fish farm manager, Family Support Financial Screen Referred: No Living Expenses: Lives with family Money Management: Spouse, Patient Does the patient have any problems obtaining your medications?: No Home Management: Both she would do inside and 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 He is aware of the team conference on Wednesday and looking forward to her target DC date. He is hopeful her swallow will get better so she can get the cortrak out. Care Coordinator Barriers to Discharge: Insurance for SNF coverage Care Coordinator Anticipated Follow Up Needs: HH/OP  Clinical Impression Pleasant female who is not able to participate in interview due to aphasia from her CVA. Obtained information from her husband who is present today along with their son-who is here from [place]. Husband can provide 24/7 care and will be here daily to provide support to her. Will continue to re-assess pt for coping as she improves. Aware team conference Wed will have goals and target discharge date for husband then. Continue to work on discharge needs. Interpreter  will be utilized for therapies  Elease Hashimoto 04/07/2022, 12:43 PM

## 2022-04-07 NOTE — Evaluation (Signed)
Occupational Therapy Assessment and Plan  Patient Details  Name: Elaine Ford MRN: [id number] Date of Birth: 31-May-1946  OT Diagnosis: abnormal posture, cognitive deficits, flaccid hemiplegia and hemiparesis, hemiplegia affecting dominant side, muscle weakness (generalized), and swelling of limb Rehab Potential: Rehab Potential (ACUTE ONLY): Good ELOS: 2-3 weeks   Today's Date: 04/07/2022 OT Individual Time: 0800-0930 OT Individual Time Calculation (min): 90 min     Hospital Problem: Principal Problem:   Acute ischemic left middle cerebral artery (MCA) stroke (HCC)   Past Medical History:  Past Medical History:  Diagnosis Date   Back pain    Broken arm    age 73 years old broke right arm after a fall   Gall bladder stones    Gestational diabetes    Hemorrhoid    Hx of cardiac cath    [facility] 07/24/14 at [facility] - no significant CAD   Hx of cardiovascular stress test    Myoview 11/14:  No ischemia   Hx of echocardiogram    Echo 07/23/14 - at [facility]:  EF 65-70%, impaired relaxation, mild LAE, mild MR, mild AI, mild elevated PASP (44.2 mmHg)   Hyperlipidemia    Hypertension    LBBB (left bundle branch block)    Low blood pressure    Nerve damage    right arm   Pain    Left upper chest s/p pacer maker insertion site 07/2014   S/P cardiac pacemaker procedure 07/2014   St. Jude - Dr. Lovena Le   Stroke [facility])    patient was unaware noted prior to pacermaker placement unknown when it occurred   Past Surgical History:  Past Surgical History:  Procedure Laterality Date   COLONOSCOPY     DILATATION & CURETTAGE/HYSTEROSCOPY WITH MYOSURE N/A 02/29/2016   Procedure: DILATATION & CURETTAGE/HYSTEROSCOPY WITH MYOSURE;  Surgeon: [name], DO;  Location: [facility];  Service: Gynecology;  Laterality: N/A;  patient has a pacemaker    GALLBLADDER SURGERY     IR 3D INDEPENDENT WKST  03/28/2022   IR PERCUTANEOUS ART THROMBECTOMY/INFUSION INTRACRANIAL INC DIAG ANGIO  03/28/2022   IR US GUIDE VASC ACCESS  RIGHT  03/28/2022   LAPAROSCOPIC BILATERAL SALPINGO OOPHERECTOMY  04/20/2016   Procedure: LAPAROSCOPIC BILATERAL SALPINGO OOPHORECTOMY;  Surgeon: [name], DO;  Location: [place] [facility];  Service: Gynecology;;   LEAD REVISION N/A 07/25/2014   Procedure: LEAD REVISION;  Surgeon: Deboraha Sprang, MD;  Location: [facility] CATH LAB;  Service: Cardiovascular;  Laterality: N/A;   PERMANENT PACEMAKER INSERTION N/A 07/24/2014   Procedure: PERMANENT PACEMAKER INSERTION;  Surgeon: [name], MD;  Location: [facility] CATH LAB;  Service: Cardiovascular;  Laterality: N/A;   RADIOLOGY WITH ANESTHESIA N/A 03/28/2022   Procedure: IR WITH ANESTHESIA;  Surgeon: Radiologist, Medication, MD;  Location: [place];  Service: Radiology;  Laterality: N/A;   VAGINAL HYSTERECTOMY  04/20/2016   Procedure: HYSTERECTOMY VAGINAL;  Surgeon: [name], DO;  Location: [facility] [facility];  Service: Gynecology;;    Assessment & Plan Clinical Impression: Elaine Ford is a 76 year old female with history of HTN, gestational diabetes, hypotension, LBBB s/p PPM who was admitted on 03/28/22 after found slumped on the couch with right sided weakness and inability to speak. She was noted to have right facial droop, left gaze deviation, right hemiplegia with severe dysarthria w/occasional phrase.  CT head showed hyperdensity L-MCA near bifurcation and proximal M2 branches. CTA/perfusion brain showed occlusion of left distal M1 with minimal flow in  proximal M2 branches and poor perfusion of  L-MCA, multifocal intracranial stenosis--stenosis B-PCA severe at L-P3 branches and moderate in  Right P1 and P2 with poor perfusion of R-P3 segments and mild stenosis M1, M2, L-V4 and right V1 segment.  Patient transferred to CIR on 04/06/2022 .    Patient currently requires total with basic self-care skills secondary to muscle weakness and muscle paralysis, impaired timing and sequencing, abnormal tone, unbalanced muscle activation, decreased coordination, and decreased motor  planning, decreased midline orientation, decreased attention to right, right side neglect, and decreased motor planning, decreased initiation, decreased attention, decreased awareness, decreased problem solving, decreased safety awareness, decreased memory, and delayed processing, and decreased sitting balance, decreased standing balance, decreased postural control, hemiplegia, and decreased balance strategies.  Prior to hospitalization, patient could complete BADL and IADL with independent .  Patient will benefit from skilled intervention to decrease level of assist with basic self-care skills prior to discharge home with care partner.  Anticipate patient will require 24 hour supervision and  Home health vs. Outpatient OT .  OT - End of Session Activity Tolerance: Decreased this session Endurance Deficit: Yes Endurance Deficit Description: labored breathing with simple functional mobility tasks - recovers with seated rests OT Assessment Rehab Potential (ACUTE ONLY): Good OT Barriers to Discharge: Home environment access/layout;Incontinence;Weight OT Patient demonstrates impairments in the following area(s): Balance;Safety;Cognition;Sensory;Edema;Endurance;Motor;Vision OT Basic ADL's Functional Problem(s): Eating;Grooming;Bathing;Dressing;Toileting OT Transfers Functional Problem(s): Toilet;Tub/Shower OT Additional Impairment(s): Fuctional Use of Upper Extremity OT Plan OT Intensity: Minimum of 1-2 x/day, 45 to 90 minutes OT Frequency: 5 out of 7 days OT Duration/Estimated Length of Stay: 2-3 weeks OT Treatment/Interventions: Balance/vestibular training;Discharge planning;Pain management;Self Care/advanced ADL retraining;Therapeutic Activities;UE/LE Coordination activities;Cognitive remediation/compensation;Functional mobility training;Patient/family education;Therapeutic Exercise;Visual/perceptual remediation/compensation;Wheelchair propulsion/positioning;UE/LE Strength  taining/ROM;Splinting/orthotics;Psychosocial support;Neuromuscular re-education;DME/adaptive equipment instruction;Community reintegration OT Self Feeding Anticipated Outcome(s): SBA OT Basic Self-Care Anticipated Outcome(s): Min A OT Toileting Anticipated Outcome(s): Min A OT Bathroom Transfers Anticipated Outcome(s): Min A OT Recommendation Recommendations for Other Services: Speech consult Patient destination: Home Follow Up Recommendations: Home health OT Equipment Recommended: 3 in 1 bedside comode;Wheelchair cushion (measurements);Wheelchair (measurements);Tub/shower bench   OT Evaluation Precautions/Restrictions  Precautions Precautions: Fall Precaution Comments: Right lateral lean, pacemaker (2016) with right arm nerve damage, HTN Restrictions Weight Bearing Restrictions: No  Pain Pain Assessment Pain Scale: 0-10 Pain Score: 0-No pain Home Living/Prior [place] expects to be discharged to:: Private residence Living Arrangements: Spouse/significant other, Children Available Help at Discharge: Family, Available 24 hours/day Type of Home: House Home Access: Stairs to enter [employer] of Steps: 1 Entrance Stairs-Rails: None Home Layout: Two level, 1/2 bath on main level, Bed/bath upstairs, Able to live on main level with bedroom/bathroom Alternate Level Stairs-Number of Steps: 13 Alternate Level Stairs-Rails: Left Bathroom Shower/Tub: Tub/shower unit, Curtain Bathroom Toilet: Handicapped height Bathroom Accessibility: Yes Additional Comments: Husband is planning to change shower to a walk-in. grab bars: toilet and shower. Has a straight cane and shower seat although didn't use any prior to admit.  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 Leisure and Hobbies: [place] playing games on iPad and phone. Listens to [employer]. Prior Function Level of Independence:  Independent with basic ADLs, Independent with homemaking with ambulation, Independent with gait  Able to Take Stairs?: Yes Driving: Yes Vision Baseline Vision/History: 1 Wears glasses (reading) Ability to See in Adequate Light: 0 Adequate Perception  Perception: Impaired Inattention/Neglect: Does not attend to right side of body;Does not attend to right visual field;Impaired-to be further tested in functional context  Praxis Praxis: Impaired Praxis Impairment Details: Initiation Cognition Cognition Overall Cognitive Status: Impaired/Different from baseline Arousal/Alertness: Lethargic (awake/alert with alternating alertness) Memory: Impaired Safety/Judgment: Impaired Brief Interview for Mental Status (BIMS) Repetition of Three Words (First Attempt): Nonsensical Temporal Orientation: Year: No answer Temporal Orientation: Month: No answer Temporal Orientation: Day: No answer Recall: "Sock": No answer Recall: "Blue": No answer Recall: "Bed": No answer BIMS Summary Score: 99 Sensation Sensation Light Touch: Impaired by gross assessment (RUE) Hot/Cold: Impaired by gross assessment (RUE) Proprioception: Impaired by gross assessment (RUE) Stereognosis: Impaired Detail Stereognosis Impaired Details: Absent RUE Additional Comments: Unable to formally assess due to aphasia and cognition. Coordination Gross Motor Movements are Fluid and Coordinated: No Fine Motor Movements are Fluid and Coordinated: No Coordination and Movement Description: R hemi (UE > LE), apraxic Finger Nose Finger Test: Unable to test due to cognitive deficits Heel Shin Test: unable on R due to weakness and apraxia 9 Hole Peg Test: not tested Motor  Motor Motor: Hemiplegia;Motor apraxia  Trunk/Postural Assessment  Cervical Assessment Cervical Assessment: Exceptions to [facility] (forward head) Thoracic Assessment Thoracic Assessment: Exceptions to [facility] (rounded shoulders) Lumbar Assessment Lumbar Assessment:  Exceptions to [facility] (posterior pelvic tilt) Postural Control Postural Control: Deficits on evaluation Righting Reactions: delayed  and impaired Protective Responses: delayed and impaired  Balance Balance Balance Assessed: Yes Static Sitting Balance Static Sitting - Balance Support: Feet supported;No upper extremity supported Static Sitting - Level of Assistance: 4: Min assist Dynamic Sitting Balance Dynamic Sitting - Balance Support: Feet supported;No upper extremity supported Dynamic Sitting - Level of Assistance: 3: Mod assist Static Standing Balance Static Standing - Balance Support: Left upper extremity supported Static Standing - Level of Assistance: 2: Max assist Dynamic Standing Balance Dynamic Standing - Balance Support: Left upper extremity supported;During functional activity Dynamic Standing - Level of Assistance: 3: Mod assist;2: Max assist Extremity/Trunk Assessment RUE Assessment RUE Assessment: Exceptions to [facility] Passive Range of Motion (PROM) Comments: WFL shoulder, elbow, wrist in all ranges. Able to achieve full composite finger extension. Passive composite finger flexion limited to 75% due to joint limitations Active Range of Motion (AROM) Comments: No A/ROM demonstrated General Strength Comments: 0/5 strength in RUE RUE Body System: Neuro LUE Assessment Active Range of Motion (AROM) Comments: WNL shoulder, wrist, elbow, and hand in all ranges. General Strength Comments: Pt demonstrated functional strength during evaluation in shoulder, elbow, wrist, in all ranges. . Decreased gross grasp noted.  Care Tool Care Tool Self Care Eating Eating activity did not occur: Safety/medical concerns (NPO)      Oral Care    Oral Care Assist Level: Dependent - Patient 0%)    Bathing   Body parts bathed by patient: Face Body parts bathed by helper: Right arm;Front perineal area;Buttocks;Right lower leg;Left lower leg;Left arm;Chest;Abdomen;Right upper leg;Left upper leg    Assist Level: Total Assistance - Patient < 25%    Upper Body Dressing(including orthotics)   What is the patient wearing?: Button up shirt   Assist Level: Total Assistance - Patient < 25%    Lower Body Dressing (excluding footwear)   What is the patient wearing?: Incontinence brief;Pants Assist for lower body dressing: Dependent - Patient 0%    Putting on/Taking off footwear   What is the patient wearing?: Non-skid slipper socks Assist for footwear: Dependent - Patient 0%       Care Tool Toileting Toileting activity   Assist for toileting: 2 Helpers     Care Tool Bed Mobility Lying to sitting on side of bed  activity   Lying to sitting on side of bed assist level: the ability to move from lying on the back to sitting on the side of the bed with no back support.: Total Assistance - Patient < 25%     Care Tool Transfers Sit to stand transfer   Sit to stand assist level: Moderate Assistance - Patient 50 - 74%    Chair/bed transfer   Chair/bed transfer assist level: Maximal Assistance - Patient 25 - 49%     Toilet transfer   Assist Level: Maximal Assistance - Patient 24 - 49%     Care Tool Cognition  Expression of Ideas and Wants Expression of Ideas and Wants: 2. Frequent difficulty - frequently exhibits difficulty with expressing needs and ideas  Understanding Verbal and Non-Verbal Content Understanding Verbal and Non-Verbal Content: 2. Sometimes understands - understands only basic conversations or simple, direct phrases. Frequently requires cues to understand   Memory/Recall Ability Memory/Recall Ability : None of the above were recalled   Refer to Care Plan for Long Term Goals  SHORT TERM GOAL WEEK 1 OT Short Term Goal 1 (Week 1): Pt will increase UB dressing and bathing to Max A while demonstrating use of hemi dressing techniques and compensatory strategies. OT Short Term Goal 2 (Week 1): Pt will increase LB bathing and dressing while utilizing AE as needed to  increase independence. OT Short Term Goal 3 (Week 1): Pt will utilize her RUE as a stabilizer when completing self feed and grooming tasks. OT Short Term Goal 4 (Week 1): Pt will increase toilet transfer to [place]  Recommendations for other services: None    Skilled Therapeutic Intervention  Patient in bed upon therapy arrival with Husband present at bedside. Patient currently demonstrating decreased Right side strength, coordination and functional use of her RUE. Husband reports that she has a history of right arm nerve damage with baseline pain as a result which occurred from her pacemaker placement. Pt was able to participate in OT evaluation with OT providing education on therapy schedule, plan of care, therapy goals, and safety protocols. Pt requires increased physical assistance to complete BADL tasks and functional transfers due to right side hemiplegia.    ADL ADL Eating: NPO Grooming: Minimal assistance Where Assessed-Grooming: Wheelchair Upper Body Bathing: Dependent Where Assessed-Upper Body Bathing: Wheelchair Lower Body Bathing: Dependent (2 person assist when standing) Where Assessed-Lower Body Bathing: Wheelchair Upper Body Dressing: Dependent Where Assessed-Upper Body Dressing: Wheelchair Lower Body Dressing: Dependent Toileting: Dependent Where Assessed-Toileting: Bedside Commode Toilet Transfer: Maximal assistance Toilet Transfer Method: Stand pivot Tub/Shower Transfer: Not assessed Tub/Shower Transfer Method: Unable to assess Gaffer Transfer: Unable to assess Mobility  Bed Mobility Bed Mobility: Supine to Sit;Sitting - Scoot to [employer] of Bed Rolling Right: Moderate Assistance - Patient 50-74% Rolling Left: Maximal Assistance - Patient 25-49% Supine to Sit: Maximal Assistance - Patient - Patient 25-49% Sitting - Scoot to Edge of Bed: Maximal Assistance - Patient 25-49% Sit to Supine: Total Assistance - Patient < 25% Transfers Sit to Stand:  Moderate Assistance - Patient 50-74% Stand to Sit: Moderate Assistance - Patient 50-74%   Discharge Criteria: Patient will be discharged from OT if patient refuses treatment 3 consecutive times without medical reason, if treatment goals not met, if there is a change in medical status, if patient makes no progress towards goals or if patient is discharged from hospital.  The above assessment, treatment plan, treatment alternatives and goals were discussed and mutually agreed upon: by patient  and by family  [name], OTR/L,CBIS  Supplemental OT - Page and Dirk Dress 04/07/2022, 4:52 PM

## 2022-04-07 NOTE — Progress Notes (Signed)
Inpatient Rehabilitation  Patient information reviewed and entered into eRehab system by Cheril Slattery M. Marshia Tropea, M.A., CCC/SLP, PPS Coordinator.  Information including medical coding, functional ability and quality indicators will be reviewed and updated through discharge.    

## 2022-04-07 NOTE — Evaluation (Signed)
Physical Therapy Assessment and Plan  Patient Details  Name: Elaine Ford MRN: [id number] Date of Birth: 06-Nov-1945  PT Diagnosis: Abnormal posture, Abnormality of gait, Cognitive deficits, Difficulty walking, Hemiparesis dominant, Impaired cognition, Impaired sensation, and Muscle weakness Rehab Potential: Good ELOS: 2-3 weeks   Today's Date: 04/07/2022 PT Individual Time: 1345-1500 PT Individual Time Calculation (min): 75 min    Hospital Problem: Principal Problem:   Acute ischemic left middle cerebral artery (MCA) stroke ([place])   Past Medical History:  Past Medical History:  Diagnosis Date   Back pain    Broken arm    age 76 years old broke right arm after a fall   Gall bladder stones    Gestational diabetes    Hemorrhoid    Hx of cardiac cath    [facility] 07/24/14 at [facility] - no significant CAD   Hx of cardiovascular stress test    Myoview 11/14:  No ischemia   Hx of echocardiogram    Echo 07/23/14 - at [facility]:  EF 65-70%, impaired relaxation, mild LAE, mild MR, mild AI, mild elevated PASP (44.2 mmHg)   Hyperlipidemia    Hypertension    LBBB (left bundle branch block)    Low blood pressure    Nerve damage    right arm   Pain    Left upper chest s/p pacer maker insertion site 07/2014   S/P cardiac pacemaker procedure 07/2014   [employer] - Dr. Lovena Le   Stroke [facility])    patient was unaware noted prior to pacermaker placement unknown when it occurred   Past Surgical History:  Past Surgical History:  Procedure Laterality Date   COLONOSCOPY     DILATATION & CURETTAGE/HYSTEROSCOPY WITH MYOSURE N/A 02/29/2016   Procedure: DILATATION & CURETTAGE/HYSTEROSCOPY WITH MYOSURE;  Surgeon: [name], DO;  Location: [facility];  Service: Gynecology;  Laterality: N/A;  patient has a pacemaker    GALLBLADDER SURGERY     IR 3D INDEPENDENT WKST  03/28/2022   IR PERCUTANEOUS ART THROMBECTOMY/INFUSION INTRACRANIAL INC DIAG ANGIO  03/28/2022   IR US GUIDE VASC ACCESS RIGHT  03/28/2022   LAPAROSCOPIC  BILATERAL SALPINGO OOPHERECTOMY  04/20/2016   Procedure: LAPAROSCOPIC BILATERAL SALPINGO OOPHORECTOMY;  Surgeon: [name], DO;  Location: [facility];  Service: Gynecology;;   LEAD REVISION N/A 07/25/2014   Procedure: LEAD REVISION;  Surgeon: Deboraha Sprang, MD;  Location: [facility] CATH LAB;  Service: Cardiovascular;  Laterality: N/A;   PERMANENT PACEMAKER INSERTION N/A 07/24/2014   Procedure: PERMANENT PACEMAKER INSERTION;  Surgeon: [name], MD;  Location: [facility] CATH LAB;  Service: Cardiovascular;  Laterality: N/A;   RADIOLOGY WITH ANESTHESIA N/A 03/28/2022   Procedure: IR WITH ANESTHESIA;  Surgeon: Radiologist, Medication, MD;  Location: [place];  Service: Radiology;  Laterality: N/A;   VAGINAL HYSTERECTOMY  04/20/2016   Procedure: HYSTERECTOMY VAGINAL;  Surgeon: [name], DO;  Location: [facility];  Service: Gynecology;;    Assessment & Plan Clinical Impression: Patient is a 76 year old female with history of HTN, gestational diabetes, hypotension, LBBB s/p PPM who was admitted on 03/28/22 after found slumped on the couch with right sided weakness and inability to speak. She was noted to have right facial droop, left gaze deviation, right hemiplegia with severe dysarthria w/occasional phrase.  CT head showed hyperdensity L-MCA near bifurcation and proximal M2 branches. CTA/perfusion brain showed occlusion of left distal M1 with minimal flow in  proximal M2 branches and poor perfusion of L-MCA, multifocal intracranial stenosis--stenosis B-PCA severe at L-P3  branches and moderate in  Right P1 and P2 with poor perfusion of R-P3 segments and mild stenosis M1, M2, L-V4 and right V1 segment. She received TNK and underwent cerebral angio with mechanical thrombectomy of Left M1/MCA occlusion with residual stenosis proximal left M2/MCA. Follow up CT head showed small to moderate SAH/contrast extravasation.  Post op noted to have episodes of atrial tachycardia. PPM interrogation showed 10 seconds of A flutter but no  evidence for AC per EP input.    She tolerated extubation without difficulty and made NPO due to severe oropharyngeal dysphagia with sensed aspiration as well as swelling of epiglottis. 2D echo showed EF 60-65% with moderate LVH and paradoxical septal motion c/w LBBB.  Repeat CT 9/18 head showed increase in edema L-MCA territory affecting L-temporal and basal ganglia with increase in mass effect. She was monitored in ICU with repeat CT head 9/22 showing conspicuous large L-MCA infarct with high density vessels and petechial hemorrhage and unchanged small volume SAH along. Plavix added 09/22 to ASA for stroke due to unknown source with recommendations for DAPT X 3 weeks followed by ASA alone.  She was kept NPO with tube feeds for nutritional support. Verbal output improving but she continues to be limited by  right sided weakness with strong right lean, right inattention, inability to follow one step commands consistently as well as perseverative paraphrasia's. PT/OT has been working with patient and CIR recommended due to functional decline.  Patient transferred to CIR on 04/06/2022 .   Patient currently requires max with mobility secondary to muscle weakness, decreased cardiorespiratoy endurance, impaired timing and sequencing, unbalanced muscle activation, motor apraxia, and decreased motor planning, decreased midline orientation, decreased attention to right, and decreased motor planning, decreased initiation, decreased attention, decreased awareness, decreased problem solving, decreased safety awareness, decreased memory, and delayed processing, and decreased sitting balance, decreased standing balance, decreased postural control, hemiplegia, and decreased balance strategies.  Prior to hospitalization, patient was independent  with mobility and lived with Spouse in a House home.  Home access is 1Stairs to enter.  Patient will benefit from skilled PT intervention to maximize safe functional mobility,  minimize fall risk, and decrease caregiver burden for planned discharge home with 24 hour assist.  Anticipate patient will benefit from follow up [facility] at discharge.  PT - End of Session Activity Tolerance: Tolerates < 10 min activity, no significant change in vital signs Endurance Deficit: Yes Endurance Deficit Description: labored breathing with simple functional mobility tasks - recovers with seated rests PT Assessment Rehab Potential (ACUTE/IP ONLY): Good PT Barriers to Discharge: Decreased caregiver support;Lack of/limited family support;Home environment access/layout;Incontinence;Weight;Insurance for SNF coverage;Nutrition means PT Patient demonstrates impairments in the following area(s): Balance;Endurance;Motor;Nutrition;Safety;Sensory;Perception;Skin Integrity PT Transfers Functional Problem(s): Bed Mobility;Bed to Chair;Car PT Locomotion Functional Problem(s): Ambulation;Stairs PT Plan PT Intensity: Minimum of 1-2 x/day ,45 to 90 minutes PT Frequency: 5 out of 7 days PT Duration Estimated Length of Stay: 2-3 weeks PT Treatment/Interventions: Ambulation/gait training;Discharge planning;Functional mobility training;Psychosocial support;Therapeutic Activities;Visual/perceptual remediation/compensation;Wheelchair propulsion/positioning;Therapeutic Exercise;Skin care/wound management;Neuromuscular re-education;Disease management/prevention;Balance/vestibular training;Cognitive remediation/compensation;DME/adaptive equipment instruction;Pain management;Splinting/orthotics;UE/LE Strength taining/ROM;UE/LE Coordination activities;Stair training;Patient/family education;Functional electrical stimulation;Community reintegration PT Transfers Anticipated Outcome(s): CGA with LRAD PT Locomotion Anticipated Outcome(s): CGA with LRAD PT Recommendation Follow Up Recommendations: 24 hour supervision/assistance Patient destination: Home Equipment Recommended: To be determined   PT  Evaluation Precautions/Restrictions Precautions Precautions: Fall Precaution Comments: R hemi, coretrack, apraxic Restrictions Weight Bearing Restrictions: No Pain Interference Pain Interference Pain Effect on Sleep: 8. Unable to answer Pain Interference with Therapy Activities: 8.  Unable to answer Pain Interference with Day-to-Day Activities: 8. Unable to answer Home Living/Prior Functioning Home Living Living Arrangements: Spouse/significant other Available Help at Discharge: Family;Available 24 hours/day Type of Home: House Home Access: Stairs to enter [employer] of Steps: 1 Entrance Stairs-Rails: None Home Layout: Two level;1/2 bath on main level;Bed/bath upstairs;Able to live on main level with bedroom/bathroom Alternate Level Stairs-Number of Steps: 13 Alternate Level Stairs-Rails: Left Bathroom Shower/Tub: Tub/shower unit;Curtain Bathroom Toilet: Handicapped height Bathroom Accessibility: Yes Additional Comments: Husband is planning to change shower to a walk-in. grab bars: toilet and shower. Has a straight cane and shower seat although didn't use any prior to admit.  Lives With: Spouse Prior Function Level of Independence: Independent with basic ADLs;Independent with homemaking with ambulation;Independent with gait  Able to Take Stairs?: Yes Driving: Yes Vision/Perception  Vision - History Ability to See in Adequate Light: 0 Adequate Perception Perception: Impaired Inattention/Neglect: Does not attend to right side of body;Does not attend to right visual field;Impaired-to be further tested in functional context Praxis Praxis: Impaired Praxis Impairment Details: Initiation;Motor planning;Perseveration  Cognition Overall Cognitive Status: Impaired/Different from baseline Arousal/Alertness: Awake/alert Orientation Level: Oriented to person;Disoriented to place;Disoriented to time;Disoriented to situation Attention: Focused;Sustained Focused Attention:  Impaired Focused Attention Impairment: Verbal basic;Functional basic Sustained Attention: Impaired Sustained Attention Impairment: Verbal basic;Functional basic Memory: Impaired Memory Impairment: Retrieval deficit;Decreased recall of new information;Decreased short term memory;Storage deficit Decreased Short Term Memory: Verbal basic;Functional basic Awareness: Impaired Awareness Impairment: Intellectual impairment;Emergent impairment Problem Solving: Impaired Problem Solving Impairment: Functional basic Executive Function:  (Impaired) Behaviors: Perseveration Safety/Judgment: Impaired Sensation Sensation Light Touch: Impaired by gross assessment Hot/Cold: Impaired by gross assessment Proprioception: Impaired by gross assessment Stereognosis: Not tested Additional Comments: Unable to formally assess due to aphasia and cognition. Coordination Gross Motor Movements are Fluid and Coordinated: No Coordination and Movement Description: R hemi (UE > LE), apraxic Heel Shin Test: unable on R due to weakness and apraxia Motor  Motor Motor: Hemiplegia;Motor apraxia   Trunk/Postural Assessment  Cervical Assessment Cervical Assessment: Within Functional Limits Thoracic Assessment Thoracic Assessment: Exceptions to [facility] (rounded shoulders) Lumbar Assessment Lumbar Assessment: Exceptions to [facility] (posterior pelvic tilt) Postural Control Postural Control: Deficits on evaluation Righting Reactions: delayed and insufficient Protective Responses: delayed and insufficient  Balance Balance Balance Assessed: Yes Static Sitting Balance Static Sitting - Balance Support: Feet supported;No upper extremity supported Static Sitting - Level of Assistance: 4: Min assist Dynamic Sitting Balance Dynamic Sitting - Balance Support: Feet supported;No upper extremity supported Dynamic Sitting - Level of Assistance: 3: Mod assist Static Standing Balance Static Standing - Balance Support: Left upper  extremity supported Static Standing - Level of Assistance: 4: Min assist Dynamic Standing Balance Dynamic Standing - Balance Support: Left upper extremity supported;During functional activity Dynamic Standing - Level of Assistance: 3: Mod assist;2: Max assist Extremity Assessment      RLE Assessment RLE Assessment: Exceptions to [facility] RLE Strength RLE Overall Strength: Deficits Right Hip Flexion: 2/5 Right Knee Flexion: 2-/5 Right Knee Extension: 3+/5 Right Ankle Dorsiflexion: 2+/5 LLE Assessment LLE Assessment: Within Functional Limits  Care Tool Care Tool Bed Mobility Roll left and right activity   Roll left and right assist level: Maximal Assistance - Patient 25 - 49%    Sit to lying activity   Sit to lying assist level: Total Assistance - Patient < 25%    Lying to sitting on side of bed activity   Lying to sitting on side of bed assist level: the ability to move from  lying on the back to sitting on the side of the bed with no back support.: Maximal Assistance - Patient 25 - 49%     Care Tool Transfers Sit to stand transfer   Sit to stand assist level: Moderate Assistance - Patient 50 - 74%    Chair/bed transfer   Chair/bed transfer assist level: Maximal Assistance - Patient 25 - 49%     Toilet transfer   Assist Level: Maximal Assistance - Patient 24 - 49%    Car transfer Car transfer activity did not occur: Safety/medical concerns        Care Tool Locomotion Ambulation   Assist level: Moderate Assistance - Patient 50 - 74% Assistive device: Parallel bars Max distance: 69f  Walk 10 feet activity Walk 10 feet activity did not occur: Safety/medical concerns       Walk 50 feet with 2 turns activity Walk 50 feet with 2 turns activity did not occur: Safety/medical concerns      Walk 150 feet activity Walk 150 feet activity did not occur: Safety/medical concerns      Walk 10 feet on uneven surfaces activity Walk 10 feet on uneven surfaces activity did not occur:  Safety/medical concerns      Stairs Stair activity did not occur: Safety/medical concerns        Walk up/down 1 step activity Walk up/down 1 step or curb (drop down) activity did not occur: Safety/medical concerns      Walk up/down 4 steps activity Walk up/down 4 steps activity did not occur: Safety/medical concerns      Walk up/down 12 steps activity Walk up/down 12 steps activity did not occur: Safety/medical concerns      Pick up small objects from floor Pick up small object from the floor (from standing position) activity did not occur: Safety/medical concerns      Wheelchair Is the patient using a wheelchair?: Yes Type of Wheelchair: Manual   Wheelchair assist level: Dependent - Patient 0% Max wheelchair distance: 02f Wheel 50 feet with 2 turns activity   Assist Level: Dependent - Patient 0%  Wheel 150 feet activity   Assist Level: Dependent - Patient 0%    Refer to Care Plan for Long Term Goals  SHORT TERM GOAL WEEK 1 PT Short Term Goal 1 (Week 1): Pt will complete bed mobility with modA PT Short Term Goal 2 (Week 1): Pt will complete bed<>chair transfers with modA and LRAD PT Short Term Goal 3 (Week 1): Pt will ambulate [id number] modA and LRAD PT Short Term Goal 4 (Week 1): Pt will initiate stair training PT Short Term Goal 5 (Week 1): Pt will participate in functional outcome measure to assess falls risk  Recommendations for other services: None   Skilled Therapeutic Intervention Mobility Bed Mobility Bed Mobility: Rolling Right;Rolling Left;Supine to Sit;Sit to Supine Rolling Right: Moderate Assistance - Patient 50-74% Rolling Left: Maximal Assistance - Patient 25-49% Supine to Sit: Maximal Assistance - Patient - Patient 25-49% Sit to Supine: Total Assistance - Patient < 25% Transfers Transfers: Sit to Stand;Stand to Sit;Squat Pivot Transfers Sit to Stand: Moderate Assistance - Patient 50-74% Stand to Sit: Moderate Assistance - Patient 50-74% Squat Pivot  Transfers: Maximal Assistance - Patient 25-49% Locomotion  Gait Ambulation: Yes Gait Assistance: Moderate Assistance - Patient 50-74% Gait Distance (Feet): 8 Feet Assistive device: Parallel bars Gait Assistance Details: Tactile cues for initiation;Tactile cues for sequencing;Tactile cues for weight shifting;Tactile cues for posture;Tactile cues for weight beaing;Verbal cues for precautions/safety;Verbal cues  for gait pattern;Verbal cues for safe use of DME/AE;Verbal cues for sequencing;Visual cues/gestures for precautions/safety;Visual cues/gestures for sequencing;Manual facilitation for weight shifting;Manual facilitation for placement;Manual facilitation for weight bearing Gait Gait: Yes Gait Pattern: Impaired Gait Pattern: Decreased stance time - right;Step-to pattern;Decreased step length - right;Decreased step length - left;Decreased hip/knee flexion - right;Decreased weight shift to right;Poor foot clearance - right;Trunk flexed Stairs / Additional Locomotion Stairs: No Wheelchair Mobility Wheelchair Mobility: No  Skilled Intervention: Pt supine in bed to start. Husband and son at bedside - both [place] speaking and can translate for patient. PLOF and social factors gathered from them - confirms 2 lvl home with 1 STE, bed/bath on 2nd floor with full flight of stairs to access. Pt was fully indep w/ no DME needs PTA. Pt noted to be incontinent of urine in the bed, fully saturated brief, linen, and pants. Required totalA for pericare, brief change. Overall, pt completes bed mobility with maxA, sitting balance with minA, squat<>pivot transfers with modA, sit<>stands in [place] with min/modA, and ambulated in // bars with modA. Pt presents with R sided weakness (RUE > RLE), motor apraxia, expressive > receptive aphasia, delayed processing and initiation, and R inattention. Pt concluded session in bed, all needs met, alarm on, HOB >30deg for tube feeds.    Discharge Criteria: Patient will be  discharged from PT if patient refuses treatment 3 consecutive times without medical reason, if treatment goals not met, if there is a change in medical status, if patient makes no progress towards goals or if patient is discharged from hospital.  The above assessment, treatment plan, treatment alternatives and goals were discussed and mutually agreed upon: by patient and by family  Alger Simons PT, DPT 04/07/2022, 3:00 PM

## 2022-04-07 NOTE — Progress Notes (Signed)
PROGRESS NOTE   Subjective/Complaints: Husband says she has been asking when she can go home She was woken frequently last night and is sleepy this morning She feels like she needs to move bowels soon He notes she has been congested  ROS: husband notes chest congestion  Objective:   DG Chest 2 View  Result Date: 04/07/2022 CLINICAL DATA:  Congestion, cough EXAM: CHEST - 2 VIEW COMPARISON:  03/28/2022 FINDINGS: Feeding tube coursing below the diaphragm. Bilateral mild interstitial thickening. No focal consolidation. No pleural effusion or pneumothorax. Stable cardiomegaly. Dual lead cardiac pacemaker. No acute osseous abnormality. IMPRESSION: Cardiomegaly with mild pulmonary vascular congestion. Electronically Signed   By: Kathreen Devoid M.D.   On: 04/07/2022 13:46   Recent Labs    04/07/22 0643  WBC 12.5*  HGB 12.4  HCT 37.8  PLT 307   Recent Labs    04/07/22 0643  NA 137  K 4.2  CL 99  CO2 26  GLUCOSE 149*  BUN 19  CREATININE 0.76  CALCIUM 9.3    Intake/Output Summary (Last 24 hours) at 04/07/2022 1523 Last data filed at 04/07/2022 0400 Gross per 24 hour  Intake 548.25 ml  Output --  Net 548.25 ml        Physical Exam: Vital Signs Blood pressure (!) 161/80, pulse 67, temperature 98.4 F (36.9 C), resp. rate 17, height '5\' 4"'$  (1.626 m), weight 87 kg, SpO2 96 %. Vitals and nursing note reviewed.  Constitutional:      Comments: Up in chair with cortak in nares. Gurgling/sonorous upper airway sounds noted.   Neurological:     Mental Status: She is alert.     Comments: Right inattention with decrease in EOMI to the right. Right facial weakness with wet/congested/sonorous upper airway sounds.  Tends to keep mouth open with reports of sore throat. Receptive> expressive deficits with delay in processing. She was able to initiate output with max cues but noted to have perseverative echolaly. Occasional social  utterance. Unable to point or state her or recognize her son's name. Able to follow simple motor commands with visual and tactile cues.     General: No acute distress Mood and affect are appropriate Heart: Regular rate and rhythm no rubs murmurs or extra sounds Lungs: upper airway sounds o/w Clear to auscultation, breathing unlabored, no rales or wheezes, appears to have chest congestion Abdomen: Positive bowel sounds, soft nontender to palpation, nondistended Extremities: No clubbing, cyanosis, or edema Skin: No evidence of breakdown, no evidence of rash Neurologic: Cranial nerves II through XII intact, motor strength is 5/5 in left deltoid, bicep, tricep, grip, hip flexor, knee extensors, ankle dorsiflexor and plantar flexor Trace finger flexin RUE o/w 0/5 3- Right HF, KE, 2- ankle PT/DF Sensory exam reduced light touch LEFT upper and lower extremities   Musculoskeletal: no pain with  range of motion in all 4 extremities. No joint swelling      Assessment/Plan: 1. Functional deficits which require 3+ hours per day of interdisciplinary therapy in a comprehensive inpatient rehab setting. Physiatrist is providing close team supervision and 24 hour management of active medical problems listed below. Physiatrist and rehab team continue to assess barriers to  discharge/monitor patient progress toward functional and medical goals  Care Tool:  Bathing    Body parts bathed by patient: Face   Body parts bathed by helper: Right arm, Front perineal area, Buttocks, Right lower leg, Left lower leg, Left arm, Chest, Abdomen, Right upper leg, Left upper leg     Bathing assist Assist Level: Total Assistance - Patient < 25%     Upper Body Dressing/Undressing Upper body dressing   What is the patient wearing?: Button up shirt    Upper body assist Assist Level: Total Assistance - Patient < 25%    Lower Body Dressing/Undressing Lower body dressing      What is the patient wearing?:  Incontinence brief, Pants     Lower body assist Assist for lower body dressing: Dependent - Patient 0%     Toileting Toileting    Toileting assist Assist for toileting: 2 Helpers     Transfers Chair/bed transfer  Transfers assist     Chair/bed transfer assist level: Maximal Assistance - Patient 25 - 49%     Locomotion Ambulation   Ambulation assist      Assist level: Moderate Assistance - Patient 50 - 74% Assistive device: Parallel bars Max distance: 65f   Walk 10 feet activity   Assist  Walk 10 feet activity did not occur: Safety/medical concerns        Walk 50 feet activity   Assist Walk 50 feet with 2 turns activity did not occur: Safety/medical concerns         Walk 150 feet activity   Assist Walk 150 feet activity did not occur: Safety/medical concerns         Walk 10 feet on uneven surface  activity   Assist Walk 10 feet on uneven surfaces activity did not occur: Safety/medical concerns         Wheelchair     Assist Is the patient using a wheelchair?: Yes Type of Wheelchair: Manual    Wheelchair assist level: Dependent - Patient 0% Max wheelchair distance: 046f   Wheelchair 50 feet with 2 turns activity    Assist        Assist Level: Dependent - Patient 0%   Wheelchair 150 feet activity     Assist      Assist Level: Dependent - Patient 0%   Blood pressure (!) 161/80, pulse 67, temperature 98.4 F (36.9 C), resp. rate 17, height '5\' 4"'$  (1.626 m), weight 87 kg, SpO2 96 %.    Medical Problem List and Plan: 1. Functional deficits secondary to Left MCA infarct             -patient may  shower             -ELOS/Goals: 14-16d MinA 2.  Antithrombotics: -DVT/anticoagulation:  Mechanical: Sequential compression devices, below knee Bilateral lower extremities             -antiplatelet therapy: DAPT X 3 weeks followed by ASA alone.  3. Pain Management:  Tylenol prn.  4. Mood/Behavior/Sleep: LCSW to follow  for evaluation and support.              -antipsychotic agents: N/A 5. Neuropsych/cognition: This patient is not fully capable of making decisions on her own behalf. 6. Skin/Wound Care: Routine pressure relief measures.  7. Fluids/Electrolytes/Nutrition: Monitor I/O. Check CMET in am. 8.  HTN: Monitor BP TID--continue coreg with BP goal <160. 9. Dysphagia: Continue NPO with tube feeds for nutritional support.  10. Hyperglycemia: Hgb A1C- 11  L-BBB s/p PPM: Brief episode of A flutter. Continue to monitor. 12. Hyperlipidemia: Continue Lipitor 13.  ABLA: Hgb down from 15.1-->12.9-->12.4. will monitor for signs of bleeding.  Repeat hgb tomorrow, ordered heme occult 14. Leukocytosis: CXR and UA ordered 15. Chest congestion: chest PT ordered.   LOS: 1 days A FACE TO FACE EVALUATION WAS PERFORMED  Clide Deutscher Damondre Pfeifle 04/07/2022, 3:23 PM

## 2022-04-07 NOTE — Progress Notes (Signed)
BLE venous duplex has been completed.   Results can be found under chart review under CV PROC. 04/07/2022 3:40 PM Darrius Montano RVT, RDMS

## 2022-04-07 NOTE — Evaluation (Addendum)
Speech Language Pathology Assessment and Plan  Patient Details  Name: Elaine Ford MRN: [id number] Date of Birth: Dec 04, 1945  SLP Diagnosis: Cognitive Impairments;Speech and Language deficits;Dysphagia;Dysarthria;Apraxia  Rehab Potential: Good ELOS: 2 weeks    Today's Date: 04/07/2022 SLP Individual Time: 1100-1200 SLP Individual Time Calculation (min): 60 min   Hospital Problem: Principal Problem:   Acute ischemic left middle cerebral artery (MCA) stroke (HCC)  Past Medical History:  Past Medical History:  Diagnosis Date   Back pain    Broken arm    age 76 years old broke right arm after a fall   Gall bladder stones    Gestational diabetes    Hemorrhoid    Hx of cardiac cath    [facility] 07/24/14 at [facility] - no significant CAD   Hx of cardiovascular stress test    Myoview 11/14:  No ischemia   Hx of echocardiogram    Echo 07/23/14 - at [facility]:  EF 65-70%, impaired relaxation, mild LAE, mild MR, mild AI, mild elevated PASP (44.2 mmHg)   Hyperlipidemia    Hypertension    LBBB (left bundle branch block)    Low blood pressure    Nerve damage    right arm   Pain    Left upper chest s/p pacer maker insertion site 07/2014   S/P cardiac pacemaker procedure 07/2014   [employer] - Dr. Lovena Le   Stroke [facility])    patient was unaware noted prior to pacermaker placement unknown when it occurred   Past Surgical History:  Past Surgical History:  Procedure Laterality Date   COLONOSCOPY     DILATATION & CURETTAGE/HYSTEROSCOPY WITH MYOSURE N/A 02/29/2016   Procedure: DILATATION & CURETTAGE/HYSTEROSCOPY WITH MYOSURE;  Surgeon: [name], DO;  Location: [facility];  Service: Gynecology;  Laterality: N/A;  patient has a pacemaker    GALLBLADDER SURGERY     IR 3D INDEPENDENT WKST  03/28/2022   IR PERCUTANEOUS ART THROMBECTOMY/INFUSION INTRACRANIAL INC DIAG ANGIO  03/28/2022   IR US GUIDE VASC ACCESS RIGHT  03/28/2022   LAPAROSCOPIC BILATERAL SALPINGO OOPHERECTOMY  04/20/2016   Procedure: LAPAROSCOPIC  BILATERAL SALPINGO OOPHORECTOMY;  Surgeon: [name], DO;  Location: [facility];  Service: Gynecology;;   LEAD REVISION N/A 07/25/2014   Procedure: LEAD REVISION;  Surgeon: Deboraha Sprang, MD;  Location[facility] CATH LAB;  Service: Cardiovascular;  Laterality: N/A;   PERMANENT PACEMAKER INSERTION N/A 07/24/2014   Procedure: PERMANENT PACEMAKER INSERTION;  Surgeon: [name], MD;  Location: [facility] CATH LAB;  Service: Cardiovascular;  Laterality: N/A;   RADIOLOGY WITH ANESTHESIA N/A 03/28/2022   Procedure: IR WITH ANESTHESIA;  Surgeon: Radiologist, Medication, MD;  Location: [facility];  Service: Radiology;  Laterality: N/A;   VAGINAL HYSTERECTOMY  04/20/2016   Procedure: HYSTERECTOMY VAGINAL;  Surgeon: [name], DO;  Location: [facility];  Service: Gynecology;;    Assessment / Plan / Recommendation Clinical Impression History of Present Illness: 76 year old female with history of gallstones, gestational diabetes, HLD, HTN, LBBB s/p pacemaker and hypotension. Presented on 03/28/22 with acute onset right hemiplegia and aphasia. She received TNK and underwent a mechanical thrombectomy for a proximal left M1 occlusion. Repeat head CT showed extensive stroke in the Left T MCA territory with cytotoxic edema and mild midline shift. Post IR with CT small SAH. Not able to do MRI due to pacemaker. 2 D echo 60-65 % normal LV function. Pacemaker interrogated showed only 10 seconds of a flutter. Cardiology consulted and felt not enough evidence for anticoagulation.  LDL 104,m Hgb A1c 5.5. On no antithrombotic pta, now on ASA and Plavix. Coreg for HTN. Resume atorvastatin at discharge. SLP evaluation and did not pass swallow. Cortrak placed for tube feeding.   SLP consulted to complete CSE, language, and cognitive evaluation s/p L MCA CVA. Interpreter present throughout; pt is bilingual though has reverted back to [place] since stroke.   Per portions of formal assessments (Bedside WAB) and informal assessment measures, pt  presents with mixed aphasia (receptive and expressive) with mild dysarthria and apraxia components. Receptively, pt exhibits difficulty with command following of single step commands, receptive identification of common objects in field of 2 (horizontal and vertical placement - horizontal placement limited by decreased attention to R visual field), and inconsistent responses to yes/no questions to basic and semi-complex questions via hand gestures (thumbs up for "yes," thumbs down for "no"). Expressively, pt exhibits echolalia which limits verbal expression of wants and needs and responses to open and close-ended + yes/no questions. Unable to assess reading and writing due to time constraints and lethargy. Informally, cognition is also appears to be negatively impacted; however, difficult to fully assess due to language deficits. Pt exhibits disorientation to place, situation, and time, diminished sustained attention, decreased visual attention to R, intellectual awareness, recall, and basic + functional problem-solving.   Re: deglutition, pt provided with oral care via suction toothbrush with Max A. Accepted ice chips x 3. Pt with prolonged oral manipulation, impaired mastication (munching vs rotary chewing pattern), and decreased awareness of bolus, which resulted in anterior bolus loss on R. Pharyngeally, pt exhibited wet vocal quality post-swallow, and was cognitively unable to produce volitional cough or throat clear. Pt continues to exhibit facial grimace with swallowing and reports that a sore throat (pt receiving throat spray, which pt's spouse reports is helpful). In the absence of PO, pt demonstrated reduced sensation and awareness of oral secretions which resulted in loss of saliva on R; intermittently benefited from verbal cues to swallow saliva. Prior to admission, pt independent with all iADL and ADL tasks at home and driving, per spouse and son who were present for today's evaluation.   Given  discrepancy between pt's PLOF and CLOF, skilled ST intervention appears indicated to address aforementioned deficits, maximize pt independence, and decrease caregiver burden. Pt and pt's family in agreement with proposed ST plan. Anticipate need for 24/7 supervision and assistance upon d/c, as well as HH ST.   Skilled Therapeutic Interventions          CSE and portions of bedside WAB administered. Please see above for details.   SLP Assessment  Patient will need skilled [facility] during CIR admission    Recommendations  SLP Diet Recommendations: NPO;Alternative means - temporary Medication Administration: Via alternative means Oral Care Recommendations: Oral care QID Patient destination: Home Follow up Recommendations: Home Health SLP;Outpatient SLP;24 hour supervision/assistance Equipment Recommended: None recommended by SLP    SLP Frequency 3 to 5 out of 7 days   SLP Duration  SLP Intensity  SLP Treatment/Interventions 2 weeks  Minumum of 1-2 x/day, 30 to 90 minutes  Dysphagia/aspiration precaution training;Speech/Language facilitation;Therapeutic Activities;Patient/family education    Pain Pain Assessment Pain Scale: 0-10 Pain Score: 0-No pain  Prior Functioning Cognitive/Linguistic Baseline: Within functional limits Type of Home: House  Lives With: Spouse Available Help at Discharge: Family;Available 24 hours/day  SLP Evaluation Cognition Overall Cognitive Status: Impaired/Different from baseline Arousal/Alertness: Lethargic Orientation Level: Oriented to person;Disoriented to place;Disoriented to time;Disoriented to situation Attention: Focused;Sustained Focused Attention:  Impaired Focused Attention Impairment: Verbal basic;Functional basic Sustained Attention: Impaired Sustained Attention Impairment: Verbal basic;Functional basic Memory: Impaired Memory Impairment: Retrieval deficit;Decreased recall of new information;Decreased short  term memory;Storage deficit Decreased Short Term Memory: Verbal basic;Functional basic Awareness: Impaired Awareness Impairment: Intellectual impairment;Emergent impairment Problem Solving: Impaired Problem Solving Impairment: Functional basic Executive Function:  (Impaired) Behaviors: Perseveration Safety/Judgment: Impaired  Comprehension Auditory Comprehension Overall Auditory Comprehension: Impaired Yes/No Questions: Impaired Basic Biographical Questions: 51-75% accurate Basic Immediate Environment Questions: 50-74% accurate Complex Questions: 0-24% accurate Commands: Impaired One Step Basic Commands: 50-74% accurate Conversation: Simple Interfering Components: Attention;Motor planning EffectiveTechniques: Visual/Gestural cues;Repetition Visual Recognition/Discrimination Discrimination: Not tested Reading Comprehension Reading Status: Unable to assess (comment) Expression Expression Primary Mode of Expression: Verbal Verbal Expression Overall Verbal Expression: Impaired Initiation: Impaired Automatic Speech: Name;Social Response Level of Generative/Spontaneous Verbalization: Word Repetition: No impairment Naming: Impairment Confrontation: Impaired Other Naming Comments: Echolalia present which limited pt's performance Verbal Errors: Perseveration;Echolalia;Not aware of errors Pragmatics: No impairment Interfering Components: Attention Effective Techniques: Other (Comment) (Repetition) Non-Verbal Means of Communication: Not applicable Written Expression Dominant Hand: Right Written Expression: Unable to assess (comment) Oral Motor Oral Motor/Sensory Function Overall Oral Motor/Sensory Function: Moderate impairment Facial ROM: Reduced right;Suspected CN VII (facial) dysfunction Facial Symmetry: Abnormal symmetry right;Suspected CN VII (facial) dysfunction Facial Strength: Reduced right;Suspected CN VII (facial) dysfunction Facial Sensation: Reduced right;Suspected  CN V (Trigeminal) dysfunction Lingual ROM: Reduced left;Reduced right;Suspected CN XII (hypoglossal) dysfunction Lingual Symmetry: Within Functional Limits Lingual Strength: Reduced Lingual Sensation: Reduced (pt reported to be biting her tongue; suspect decreased sensation) Motor Speech Overall Motor Speech: Impaired Respiration: Within functional limits Phonation: Hoarse;Breathy Articulation: Impaired Level of Impairment: Word Intelligibility: Intelligibility reduced Word: 75-100% accurate Phrase: 75-100% accurate Motor Planning:  (Needs further assesment)  Care Tool Care Tool Cognition Ability to hear (with hearing aid or hearing appliances if normally used Ability to hear (with hearing aid or hearing appliances if normally used): 0. Adequate - no difficulty in normal conservation, social interaction, listening to TV   Expression of Ideas and Wants Expression of Ideas and Wants: 2. Frequent difficulty - frequently exhibits difficulty with expressing needs and ideas   Understanding Verbal and Non-Verbal Content Understanding Verbal and Non-Verbal Content: 2. Sometimes understands - understands only basic conversations or simple, direct phrases. Frequently requires cues to understand  Memory/Recall Ability Memory/Recall Ability : None of the above were recalled    Intelligibility: Intelligibility reduced Word: 75-100% accurate Phrase: 75-100% accurate  Bedside Swallowing Assessment General Date of Onset: 03/29/22 Previous Swallow Assessment: BSE and MBSS on 03/30/2022 - recs for NPO with short-term AMON Diet Prior to this Study: NPO;NG Tube Temperature Spikes Noted: No Respiratory Status: Room air History of Recent Intubation: Yes Length of Intubations (days): 2 days Date extubated: 03/29/22 Behavior/Cognition: Requires cueing;Lethargic/Drowsy Oral Cavity - Dentition: Adequate natural dentition Self-Feeding Abilities: Total assist Vision: Functional for  self-feeding Patient Positioning: Upright in bed Baseline Vocal Quality: Hoarse Volitional Cough: Cognitively unable to elicit Volitional Swallow: Unable to elicit  Ice Chips Ice chips: Impaired Presentation: Spoon Oral Phase Impairments: Reduced lingual movement/coordination;Reduced labial seal;Impaired mastication;Poor awareness of bolus Oral Phase Functional Implications: Right anterior spillage Pharyngeal Phase Impairments: Wet Vocal Quality Thin Liquid Thin Liquid: Not tested Nectar Thick Nectar Thick Liquid: Not tested Honey Thick Honey Thick Liquid: Not tested Puree Puree: Not tested Solid Solid: Not tested BSE Assessment Suspected Esophageal Findings Suspected Esophageal Findings:  (N/A) Risk for Aspiration Impact on safety and function: Severe aspiration risk Other Related Risk Factors: Decreased respiratory status;Decreased  management of secretions;Cognitive impairment;Prolonged intubation;Deconditioning  Short Term Goals: Week 1: SLP Short Term Goal 1 (Week 1): Pt will participate in therapeutic trials of ice chips with minimal s/sx concerning for aspiration given Max A to prepare for MBSS. SLP Short Term Goal 2 (Week 1): Pt will receptively ID common objects in field of 2 (vertical presentation) with 80% accuracy given Max A multimodal cues. SLP Short Term Goal 3 (Week 1): Pt will respond to basic and personal yes/no questions verbally or via alternate means with 80% accuracy given Max A multimodal cues. SLP Short Term Goal 4 (Week 1): Pt will attend to functional and therapeutic tasks for 5 minutes given Mod A. SLP Short Term Goal 5 (Week 1): Pt will utilize AAC (low-tech picture board, gestures) to communicate wants/needs given Max A multimodal cues SLP Short Term Goal 6 (Week 1): Pt will demonstrate orientation x 4 via multimodal communication given Max A multinodal cues.  Refer to Care Plan for Long Term Goals  Recommendations for other services: None    Discharge Criteria: Patient will be discharged from SLP if patient refuses treatment 3 consecutive times without medical reason, if treatment goals not met, if there is a change in medical status, if patient makes no progress towards goals or if patient is discharged from hospital.  The above assessment, treatment plan, treatment alternatives and goals were discussed and mutually agreed upon: by patient and by family  Elaine Ford 04/07/2022, 2:15 PM

## 2022-04-07 NOTE — Plan of Care (Signed)
  Problem: RH Swallowing Goal: LTG Patient will consume least restrictive diet using compensatory strategies with assistance (SLP) Description: LTG:  Patient will consume least restrictive diet using compensatory strategies with assistance (SLP) Flowsheets (Taken 04/07/2022 2213) LTG: Pt Patient will consume least restrictive diet using compensatory strategies with assistance of (SLP): Minimal Assistance - Patient > 75% Goal: LTG Patient will participate in dysphagia therapy to increase swallow function with assistance (SLP) Description: LTG:  Patient will participate in dysphagia therapy to increase swallow function with assistance (SLP) Flowsheets (Taken 04/07/2022 2213) LTG: Pt will participate in dysphagia therapy to increase swallow function with assistance of (SLP): Minimal Assistance - Patient > 75% Goal: LTG Pt will demonstrate functional change in swallow as evidenced by bedside/clinical objective assessment (SLP) Description: LTG: Patient will demonstrate functional change in swallow as evidenced by bedside/clinical objective assessment (SLP) Flowsheets (Taken 04/07/2022 2213) LTG: Patient will demonstrate functional change in swallow as evidenced by bedside/clinical objective assessment: Oropharyngeal swallow   Problem: RH Cognition - SLP Goal: RH LTG Patient will demonstrate orientation with cues Description:  LTG:  Patient will demonstrate orientation to person/place/time/situation with cues (SLP)   Flowsheets (Taken 04/07/2022 2213) LTG Patient will demonstrate orientation to:  Place  Time  Situation LTG: Patient will demonstrate orientation using cueing (SLP): Minimal Assistance - Patient > 75%   Problem: RH Comprehension Communication Goal: LTG Patient will comprehend basic/complex auditory (SLP) Description: LTG: Patient will comprehend basic/complex auditory information with cues (SLP). Flowsheets (Taken 04/07/2022 2213) LTG: Patient will comprehend: Basic auditory  information LTG: Patient will comprehend auditory information with cueing (SLP): Minimal Assistance - Patient > 75%   Problem: RH Expression Communication Goal: LTG Patient will express needs/wants via multi-modal(SLP) Description: LTG:  Patient will express needs/wants via multi-modal communication (gestures/written, etc) with cues (SLP) Flowsheets (Taken 04/07/2022 2213) LTG: Patient will express needs/wants via multimodal communication (gestures/written, etc) with cueing (SLP): Minimal Assistance - Patient > 75% Goal: LTG Patient will verbally express basic/complex needs(SLP) Description: LTG:  Patient will verbally express basic/complex needs, wants or ideas with cues  (SLP) Flowsheets (Taken 04/07/2022 2213) LTG: Patient will verbally express basic/complex needs, wants or ideas (SLP): Moderate Assistance - Patient 50 - 74% Goal: LTG Patient will increase word finding of common (SLP) Description: LTG:  Patient will increase word finding of common objects/daily info/abstract thoughts with cues using compensatory strategies (SLP). Flowsheets (Taken 04/07/2022 2213) LTG: Patient will increase word finding of common (SLP): Moderate Assistance - Patient 50 - 74% Patient will use compensatory strategies to increase word finding of: Common objects   Problem: RH Attention Goal: LTG Patient will demonstrate this level of attention during functional activites (SLP) Description: LTG:  Patient will will demonstrate this level of attention during functional activites (SLP) Flowsheets (Taken 04/07/2022 2213) Patient will demonstrate during cognitive/linguistic activities the attention type of: Sustained Patient will demonstrate this level of attention during cognitive/linguistic activities in: Controlled LTG: Patient will demonstrate this level of attention during cognitive/linguistic activities with assistance of (SLP): Minimal Assistance - Patient > 75% Number of minutes patient will demonstrate  attention during cognitive/linguistic activities: 15   Problem: RH Awareness Goal: LTG: Patient will demonstrate awareness during functional activites type of (SLP) Description: LTG: Patient will demonstrate awareness during functional activites type of (SLP) Flowsheets (Taken 04/07/2022 2213) Patient will demonstrate during cognitive/linguistic activities awareness type of: Intellectual LTG: Patient will demonstrate awareness during cognitive/linguistic activities with assistance of (SLP): Minimal Assistance - Patient > 75%

## 2022-04-07 NOTE — Plan of Care (Signed)
  Problem: RH Balance Goal: LTG Patient will maintain dynamic sitting balance (PT) Description: LTG:  Patient will maintain dynamic sitting balance with assistance during mobility activities (PT) Flowsheets (Taken 04/07/2022 1452) LTG: Pt will maintain dynamic sitting balance during mobility activities with:: Supervision/Verbal cueing Goal: LTG Patient will maintain dynamic standing balance (PT) Description: LTG:  Patient will maintain dynamic standing balance with assistance during mobility activities (PT) Flowsheets (Taken 04/07/2022 1452) LTG: Pt will maintain dynamic standing balance during mobility activities with:: Contact Guard/Touching assist   Problem: Sit to Stand Goal: LTG:  Patient will perform sit to stand with assistance level (PT) Description: LTG:  Patient will perform sit to stand with assistance level (PT) Flowsheets (Taken 04/07/2022 1452) LTG: PT will perform sit to stand in preparation for functional mobility with assistance level: Contact Guard/Touching assist   Problem: RH Bed Mobility Goal: LTG Patient will perform bed mobility with assist (PT) Description: LTG: Patient will perform bed mobility with assistance, with/without cues (PT). Flowsheets (Taken 04/07/2022 1452) LTG: Pt will perform bed mobility with assistance level of: Minimal Assistance - Patient > 75%   Problem: RH Bed to Chair Transfers Goal: LTG Patient will perform bed/chair transfers w/assist (PT) Description: LTG: Patient will perform bed to chair transfers with assistance (PT). Flowsheets (Taken 04/07/2022 1452) LTG: Pt will perform Bed to Chair Transfers with assistance level: Contact Guard/Touching assist   Problem: RH Car Transfers Goal: LTG Patient will perform car transfers with assist (PT) Description: LTG: Patient will perform car transfers with assistance (PT). Flowsheets (Taken 04/07/2022 1452) LTG: Pt will perform car transfers with assist:: Minimal Assistance - Patient > 75%   Problem:  RH Ambulation Goal: LTG Patient will ambulate in controlled environment (PT) Description: LTG: Patient will ambulate in a controlled environment, # of feet with assistance (PT). Flowsheets (Taken 04/07/2022 1452) LTG: Pt will ambulate in controlled environ  assist needed:: Contact Guard/Touching assist LTG: Ambulation distance in controlled environment: 168f Goal: LTG Patient will ambulate in home environment (PT) Description: LTG: Patient will ambulate in home environment, # of feet with assistance (PT). Flowsheets (Taken 04/07/2022 1452) LTG: Pt will ambulate in home environ  assist needed:: Contact Guard/Touching assist LTG: Ambulation distance in home environment: 156f  Problem: RH Stairs Goal: LTG Patient will ambulate up and down stairs w/assist (PT) Description: LTG: Patient will ambulate up and down # of stairs with assistance (PT) Flowsheets (Taken 04/07/2022 1452) LTG: Pt will ambulate up/down stairs assist needed:: Minimal Assistance - Patient > 75% LTG: Pt will  ambulate up and down number of stairs: 12

## 2022-04-08 DIAGNOSIS — R1310 Dysphagia, unspecified: Secondary | ICD-10-CM | POA: Diagnosis not present

## 2022-04-08 DIAGNOSIS — D649 Anemia, unspecified: Secondary | ICD-10-CM | POA: Diagnosis not present

## 2022-04-08 DIAGNOSIS — I63512 Cerebral infarction due to unspecified occlusion or stenosis of left middle cerebral artery: Secondary | ICD-10-CM | POA: Diagnosis not present

## 2022-04-08 DIAGNOSIS — D72829 Elevated white blood cell count, unspecified: Secondary | ICD-10-CM | POA: Diagnosis not present

## 2022-04-08 LAB — GLUCOSE, CAPILLARY
Glucose-Capillary: 163 mg/dL — ABNORMAL HIGH (ref 70–99)
Glucose-Capillary: 104 mg/dL — ABNORMAL HIGH (ref 70–99)
Glucose-Capillary: 159 mg/dL — ABNORMAL HIGH (ref 70–99)
Glucose-Capillary: 162 mg/dL — ABNORMAL HIGH (ref 70–99)

## 2022-04-08 LAB — CBC WITH DIFFERENTIAL/PLATELET
Abs Immature Granulocytes: 0.04 10*3/uL (ref 0.00–0.07)
Basophils Absolute: 0 10*3/uL (ref 0.0–0.1)
Basophils Relative: 0 %
Eosinophils Absolute: 0.1 10*3/uL (ref 0.0–0.5)
Eosinophils Relative: 1 %
HCT: 36.5 % (ref 36.0–46.0)
Hemoglobin: 12.2 g/dL (ref 12.0–15.0)
Immature Granulocytes: 0 %
Lymphocytes Relative: 10 %
Lymphs Abs: 1.1 10*3/uL (ref 0.7–4.0)
MCH: 27.7 pg (ref 26.0–34.0)
MCHC: 33.4 g/dL (ref 30.0–36.0)
MCV: 82.8 fL (ref 80.0–100.0)
Monocytes Absolute: 0.7 10*3/uL (ref 0.1–1.0)
Monocytes Relative: 6 %
Neutro Abs: 9.5 10*3/uL — ABNORMAL HIGH (ref 1.7–7.7)
Neutrophils Relative %: 83 %
Platelets: 315 10*3/uL (ref 150–400)
RBC: 4.41 MIL/uL (ref 3.87–5.11)
RDW: 14.6 % (ref 11.5–15.5)
WBC: 11.5 10*3/uL — ABNORMAL HIGH (ref 4.0–10.5)
nRBC: 0 % (ref 0.0–0.2)

## 2022-04-08 MED ORDER — KETOTIFEN FUMARATE 0.025 % OP SOLN
1.0000 [drp] | Freq: Two times a day (BID) | OPHTHALMIC | Status: DC
  Administered 2022-04-08 – 2022-04-24 (×26): 1 [drp] via OPHTHALMIC
  Filled 2022-04-08: qty 5

## 2022-04-08 MED ORDER — B COMPLEX-C PO TABS
1.0000 | ORAL_TABLET | Freq: Every day | ORAL | Status: DC
  Administered 2022-04-09 – 2022-04-27 (×19): 1 via ORAL
  Filled 2022-04-08 (×19): qty 1

## 2022-04-08 MED ORDER — FREE WATER
100.0000 mL | Status: DC
  Administered 2022-04-08 – 2022-04-12 (×24): 100 mL

## 2022-04-08 MED ORDER — SENNOSIDES-DOCUSATE SODIUM 8.6-50 MG PO TABS: 1.0000 | ORAL_TABLET | Freq: Every evening | ORAL | Status: DC | PRN

## 2022-04-08 MED ORDER — OSMOLITE 1.5 CAL PO LIQD
1000.0000 mL | ORAL | Status: DC
  Administered 2022-04-10: 1000 mL
  Filled 2022-04-08 (×4): qty 1000

## 2022-04-08 MED ORDER — OSMOLITE 1.5 CAL PO LIQD
1000.0000 mL | ORAL | Status: DC
  Administered 2022-04-08 (×2): 1000 mL
  Filled 2022-04-08 (×2): qty 1000

## 2022-04-08 MED ORDER — CEPHALEXIN 250 MG PO CAPS
500.0000 mg | ORAL_CAPSULE | Freq: Two times a day (BID) | ORAL | Status: DC
  Administered 2022-04-08 – 2022-04-12 (×9): 500 mg via ORAL
  Filled 2022-04-08 (×9): qty 2

## 2022-04-08 NOTE — Progress Notes (Signed)
Initial Nutrition Assessment  DOCUMENTATION CODES:   Not applicable  INTERVENTION:   Tube feeding via Cortrak: - Osmolite 1.5 @ 60 ml/hr x 20 hours (tube feeds can be held for up to 4 hours daily for therapies) - PROSource TF20 60 ml daily - Free water flushes of 100 ml q 4 hours  Tube feeding regimen provides 1880 kcal, 95 grams of protein, and 1514 ml of H2O.   NUTRITION DIAGNOSIS:   Inadequate oral intake related to inability to eat as evidenced by NPO status.  GOAL:   Patient will meet greater than or equal to 90% of their needs  MONITOR:   Diet advancement, Labs, Weight trends, TF tolerance  REASON FOR ASSESSMENT:   Consult Enteral/tube feeding initiation and management  ASSESSMENT:   76 year old female with PMH of HTN, gestational diabetes, HLD, LBBB s/p PPM. Pt admitted on 03/28/22 with L MCA stroke. Pt received TNK and underwent cerebral angio with mechanical thrombectomy. Pt has been NPO due to severe oropharyngeal dysphagia with sensed aspiration as well as swelling of epiglottis. Cortrak in place for nutrition support. Pt admitted to [facility] on 04/06/22.  Consult received for enteral nutrition initiation and management. Pt with Cortrak in place, currently NPO. Will adjust tube feeds to meet pt's estimated needs over 20 hours so that feeds can be held for therapies. Discussed plan with RN.  Reviewed weight history in chart. Pt with a 1.3 kg weight loss since admission to CIR. This is a 1.5% weight loss in less than 1 week which is significant for timeframe. RD to monitor weight trends.  Spoke with pt, husband, and son at bedside. Family reports that pt typically has a great appetite and eats 3 meals daily. Family reports pt had been trying to lose weight but had not had any drastic weight changes. They are unsure of her UBW.  Admit weight: 86.8 kg Current weight: 85.5 kg  Pt with mild pitting edema to BUE, moderate pitting edema to RLE, and non-pitting edema to  LLE.  Current TF: Osmolite 1.5 @ 45 ml/hr, PROSource TF20 60 ml daily  Medications reviewed and include: colace, SSI q 4 hours, magnesium gluconate, protonix  Labs reviewed. CBG's: 107-163 x 24 hours  NUTRITION - FOCUSED PHYSICAL EXAM:  Flowsheet Row Most Recent Value  Orbital Region No depletion  Upper Arm Region No depletion  Thoracic and Lumbar Region No depletion  Buccal Region No depletion  Temple Region No depletion  Clavicle Bone Region No depletion  Clavicle and Acromion Bone Region No depletion  Scapular Bone Region No depletion  Dorsal Hand No depletion  Patellar Region No depletion  Anterior Thigh Region No depletion  Posterior Calf Region No depletion  Edema (RD Assessment) None  Hair Reviewed  Eyes Reviewed  Mouth Reviewed  Skin Reviewed  Nails Reviewed       Diet Order:   Diet Order             Diet NPO time specified Except for: Ice Chips  Diet effective now                   EDUCATION NEEDS:   No education needs have been identified at this time  Skin:  Skin Assessment: Reviewed RN Assessment  Last BM:  04/07/22 medium type 4  Height:   Ht Readings from Last 1 Encounters:  04/06/22 '5\' 4"'$  (1.626 m)    Weight:   Wt Readings from Last 1 Encounters:  04/08/22 85.5 kg  BMI:  Body mass index is 32.35 kg/m.  Estimated Nutritional Needs:   Kcal:  6151-8343  Protein:  85-100 grams  Fluid:  1.7-1.9 L    Gustavus Bryant, MS, RD, LDN Inpatient Clinical Dietitian Please see [facility] for contact information.

## 2022-04-08 NOTE — Progress Notes (Signed)
Occupational Therapy Session Note  Patient Details  Name: Elaine Ford MRN: [id number] Date of Birth: 1946/03/24  Today's Date: 04/08/2022 OT Individual Time: 0945-1100 OT Individual Time Calculation (min): 75 min    Short Term Goals: Week 1:  OT Short Term Goal 1 (Week 1): Pt will increase UB dressing and bathing to Max A while demonstrating use of hemi dressing techniques and compensatory strategies. OT Short Term Goal 2 (Week 1): Pt will increase LB bathing and dressing while utilizing AE as needed to increase independence. OT Short Term Goal 3 (Week 1): Pt will utilize her RUE as a stabilizer when completing self feed and grooming tasks. OT Short Term Goal 4 (Week 1): Pt will increase toilet transfer to [place] Therapeutic Interventions/Progress Updates:    Patient received supine in bed with head of bed elevated.  Patient with left arm draped in ice pack.  Husband indicates that patient has restricted extremity.   Patient alert, interpreter present.   Patient assisted to roll and transition to edge of bed to address sitting balance.  Patient able to maintain midline sitting to allow LB dressing.  Patient not yet able to flex forward to don pants and return to upright seated position in midline.  Patient following simple commands for familiar functional tasks.  Patient often though just repeating last phrase she's heard.   Patient transferred via squat pivot to wheelchair, and transported to sink to complete hygiene.  Brought large mirror as patient unable to see herself while seated in wheelchair.  Patient not attending to right body, right environment.  Eyes not moving right of midline without max cueing.   Transported to gym to further address postural control, and attention to right UE.  Patient able to maintain upright seated position, but difficulty maintaining - fatigues.  Patient able to move RUE on mobile surface when weight biased toward RUE.   Patient returned  to room, and safety belt in place and engaged.  Call bell in lap.    Therapy Documentation Precautions:  Precautions Precautions: Fall Precaution Comments: R hemi, coretrack, apraxic Restrictions Weight Bearing Restrictions: No  Pain: Pain Assessment Pain Score: 2      Therapy/Group: Individual Therapy  Mariah Milling 04/08/2022, 12:23 PM

## 2022-04-08 NOTE — Progress Notes (Signed)
PROGRESS NOTE   Subjective/Complaints: Complains of scratch by her right eye Husband says she did very well today Discussed UTI, treating with keflex, f/u UC Discussed chest XR shows congestion  ROS: husband notes chest congestion, +fatigue  Objective:   VAS [place] LOWER EXTREMITY VENOUS (DVT)  Result Date: 04/07/2022  Lower Venous DVT Study Patient Name:  Elaine Ford  Date of Exam:   04/07/2022 Medical Rec #: [id number]       Accession #:    [id number] Date of Birth: Mar 28, 1946       Patient Gender: F Patient Age:   76 years Exam Location:  [facility] Procedure:      VAS [place] LOWER EXTREMITY VENOUS (DVT) Referring Phys: PAMELA LOVE --------------------------------------------------------------------------------  Indications: Immobility (rehab patient).  Limitations: Body habitus, poor ultrasound/tissue interface and patient movement/agitation. Comparison Study: Previous exam on 07/15/21 was negative for DVT. Performing Technologist: Rogelia Rohrer RVT, RDMS  Examination Guidelines: A complete evaluation includes B-mode imaging, spectral Doppler, color Doppler, and power Doppler as needed of all accessible portions of each vessel. Bilateral testing is considered an integral part of a complete examination. Limited examinations for reoccurring indications may be performed as noted. The reflux portion of the exam is performed with the patient in reverse Trendelenburg.  +---------+---------------+---------+-----------+----------+--------------+ RIGHT    CompressibilityPhasicitySpontaneityPropertiesThrombus Aging +---------+---------------+---------+-----------+----------+--------------+ CFV      Full           Yes      Yes                                 +---------+---------------+---------+-----------+----------+--------------+ SFJ      Full                                                         +---------+---------------+---------+-----------+----------+--------------+ FV Prox  Full           Yes      Yes                                 +---------+---------------+---------+-----------+----------+--------------+ FV Mid   Full           Yes      Yes                                 +---------+---------------+---------+-----------+----------+--------------+ FV DistalFull           Yes      Yes                                 +---------+---------------+---------+-----------+----------+--------------+ PFV      Full                                                        +---------+---------------+---------+-----------+----------+--------------+  POP      Full           Yes      Yes                                 +---------+---------------+---------+-----------+----------+--------------+ PTV      Full                                                        +---------+---------------+---------+-----------+----------+--------------+ PERO     Full                                                        +---------+---------------+---------+-----------+----------+--------------+   +--------+---------------+---------+-----------+----------+--------------------+ LEFT    CompressibilityPhasicitySpontaneityPropertiesThrombus Aging       +--------+---------------+---------+-----------+----------+--------------------+ CFV     Full           Yes      Yes                                       +--------+---------------+---------+-----------+----------+--------------------+ SFJ     Full                                                              +--------+---------------+---------+-----------+----------+--------------------+ FV Prox Full           Yes      Yes                                       +--------+---------------+---------+-----------+----------+--------------------+ FV Mid  Full           Yes      Yes                                        +--------+---------------+---------+-----------+----------+--------------------+ FV                     Yes      Yes                  patent by            Distal                                               color/doppler        +--------+---------------+---------+-----------+----------+--------------------+ PFV     Full                                                              +--------+---------------+---------+-----------+----------+--------------------+  POP     Full           Yes      Yes                                       +--------+---------------+---------+-----------+----------+--------------------+ PTV                                                  Not well visualized  +--------+---------------+---------+-----------+----------+--------------------+ PERO                                                 Not well visualized  +--------+---------------+---------+-----------+----------+--------------------+ LLE distal thigh through calf limited due to patient agitation and pain intolerance.    Summary: BILATERAL: -No evidence of popliteal cyst, bilaterally. RIGHT: - There is no evidence of deep vein thrombosis in the lower extremity.  LEFT: - There is no evidence of deep vein thrombosis in the lower extremity. However, portions of this examination were limited- see technologist comments above.  *See table(s) above for measurements and observations. Electronically signed by Jamelle Haring on 04/07/2022 at 5:15:02 PM.    Final    DG Chest 2 View  Result Date: 04/07/2022 CLINICAL DATA:  Congestion, cough EXAM: CHEST - 2 VIEW COMPARISON:  03/28/2022 FINDINGS: Feeding tube coursing below the diaphragm. Bilateral mild interstitial thickening. No focal consolidation. No pleural effusion or pneumothorax. Stable cardiomegaly. Dual lead cardiac pacemaker. No acute osseous abnormality. IMPRESSION: Cardiomegaly with mild pulmonary vascular congestion. Electronically Signed    By: Kathreen Devoid M.D.   On: 04/07/2022 13:46    Recent Labs    04/07/22 0643 04/08/22 0656  WBC 12.5* 11.5*  HGB 12.4 12.2  HCT 37.8 36.5  PLT 307 315   Recent Labs    04/07/22 0643  NA 137  K 4.2  CL 99  CO2 26  GLUCOSE 149*  BUN 19  CREATININE 0.76  CALCIUM 9.3    Intake/Output Summary (Last 24 hours) at 04/08/2022 1925 Last data filed at 04/08/2022 1300 Gross per 24 hour  Intake 0 ml  Output --  Net 0 ml        Physical Exam: Vital Signs Blood pressure (!) 121/59, pulse (!) 59, temperature 97.9 F (36.6 C), temperature source Oral, resp. rate 18, height '5\' 4"'$  (1.626 m), weight 85.5 kg, SpO2 97 %. Vitals and nursing note reviewed.  Constitutional:      Comments: Up in chair with cortak in nares. Gurgling/sonorous upper airway sounds noted.   HEENT: NGT in place Neurological:     Mental Status: She is alert.     Comments: Right inattention with decrease in EOMI to the right. Right facial weakness with wet/congested/sonorous upper airway sounds.  Tends to keep mouth open with reports of sore throat. Receptive> expressive deficits with delay in processing. She was able to initiate output with max cues but noted to have perseverative echolaly. Occasional social utterance. Unable to point or state her or recognize her son's name. Able to follow simple motor commands with visual and tactile cues.     General: No acute distress Mood and affect are appropriate Heart:  Regular rate and rhythm no rubs murmurs or extra sounds Lungs: upper airway sounds o/w Clear to auscultation, breathing unlabored, no rales or wheezes, appears to have chest congestion Abdomen: Positive bowel sounds, soft nontender to palpation, nondistended Extremities: No clubbing, cyanosis, or edema Skin: No evidence of breakdown, no evidence of rash Neurologic: Cranial nerves II through XII intact, motor strength is 5/5 in left deltoid, bicep, tricep, grip, hip flexor, knee extensors, ankle dorsiflexor  and plantar flexor Trace finger flexin RUE o/w 0/5 3- Right HF, KE, 2- ankle PT/DF Sensory exam reduced light touch LEFT upper and lower extremities   Musculoskeletal: no pain with  range of motion in all 4 extremities. No joint swelling      Assessment/Plan: 1. Functional deficits which require 3+ hours per day of interdisciplinary therapy in a comprehensive inpatient rehab setting. Physiatrist is providing close team supervision and 24 hour management of active medical problems listed below. Physiatrist and rehab team continue to assess barriers to discharge/monitor patient progress toward functional and medical goals  Care Tool:  Bathing    Body parts bathed by patient: Face   Body parts bathed by helper: Right arm, Front perineal area, Buttocks, Right lower leg, Left lower leg, Left arm, Chest, Abdomen, Right upper leg, Left upper leg     Bathing assist Assist Level: Total Assistance - Patient < 25%     Upper Body Dressing/Undressing Upper body dressing   What is the patient wearing?: Button up shirt    Upper body assist Assist Level: Total Assistance - Patient < 25%    Lower Body Dressing/Undressing Lower body dressing      What is the patient wearing?: Pants     Lower body assist Assist for lower body dressing: Maximal Assistance - Patient 25 - 49%     Toileting Toileting    Toileting assist Assist for toileting: 2 Helpers     Transfers Chair/bed transfer  Transfers assist  Chair/bed transfer activity did not occur: (P) Safety/medical concerns  Chair/bed transfer assist level: Moderate Assistance - Patient 50 - 74%     Locomotion Ambulation   Ambulation assist      Assist level: Moderate Assistance - Patient 50 - 74% Assistive device: Parallel bars Max distance: 53f   Walk 10 feet activity   Assist  Walk 10 feet activity did not occur: Safety/medical concerns        Walk 50 feet activity   Assist Walk 50 feet with 2 turns activity  did not occur: Safety/medical concerns         Walk 150 feet activity   Assist Walk 150 feet activity did not occur: Safety/medical concerns         Walk 10 feet on uneven surface  activity   Assist Walk 10 feet on uneven surfaces activity did not occur: Safety/medical concerns         Wheelchair     Assist Is the patient using a wheelchair?: Yes Type of Wheelchair: Manual    Wheelchair assist level: Dependent - Patient 0% Max wheelchair distance: 048f   Wheelchair 50 feet with 2 turns activity    Assist        Assist Level: Dependent - Patient 0%   Wheelchair 150 feet activity     Assist      Assist Level: Dependent - Patient 0%   Blood pressure (!) 121/59, pulse (!) 59, temperature 97.9 F (36.6 C), temperature source Oral, resp. rate 18, height '5\' 4"'$  (1.626 m),  weight 85.5 kg, SpO2 97 %.    Medical Problem List and Plan: 1. Functional deficits secondary to Left MCA infarct             -patient may  shower             -ELOS/Goals: 14-16d MinA  Start vitamin B/C supplement 2.  Antithrombotics: -DVT/anticoagulation:  Mechanical: Sequential compression devices, below knee Bilateral lower extremities             -antiplatelet therapy: DAPT X 3 weeks followed by ASA alone.  3. Pain Management:  Tylenol prn.  4. Mood/Behavior/Sleep: LCSW to follow for evaluation and support.              -antipsychotic agents: N/A 5. Neuropsych/cognition: This patient is not fully capable of making decisions on her own behalf. 6. Skin/Wound Care: Routine pressure relief measures.  7. Fluids/Electrolytes/Nutrition: Monitor I/O. Check CMET in am. 8.  HTN: Monitor BP TID--continue coreg with BP goal <160. 9. Dysphagia: Continue NPO with tube feeds for nutritional support.  10. Hyperglycemia: Hgb A1C- 11 L-BBB s/p PPM: Brief episode of A flutter. Continue to monitor. 12. Hyperlipidemia: Continue Lipitor 13.  ABLA: Hgb down from 15.1-->12.9-->12.4. will  monitor for signs of bleeding.  Repeat hgb tomorrow, ordered heme occult 14. UTI: Keflex started. F/u UC 15. Chest congestion: chest PT ordered. Decrease TF rate to 55/hour to avoid fluid overload 16. Obesity: BMI 32: provide dietary education   LOS: 2 days A FACE TO FACE EVALUATION WAS PERFORMED  Martha Clan P Hamid Brookens 04/08/2022, 7:25 PM

## 2022-04-08 NOTE — Progress Notes (Signed)
Physical Therapy Session Note  Patient Details  Name: Elaine Ford MRN: [id number] Date of Birth: 02-22-1946  Today's Date: 04/08/2022 PT Individual Time: 9417-4081 PT Individual Time Calculation (min): 71 min   Short Term Goals: Week 1:  PT Short Term Goal 1 (Week 1): Pt will complete bed mobility with modA PT Short Term Goal 2 (Week 1): Pt will complete bed<>chair transfers with modA and LRAD PT Short Term Goal 3 (Week 1): Pt will ambulate 88f with modA and LRAD PT Short Term Goal 4 (Week 1): Pt will initiate stair training PT Short Term Goal 5 (Week 1): Pt will participate in functional outcome measure to assess falls risk  Skilled Therapeutic Interventions/Progress Updates: Pt presents sitting in w/c and agreeable to therapy, also through spouse.  Nursing disconnected FT for therapy session.  Pt wheeled to main gym for time and energy conservation.  Pt placed in standing frame for increased time WB through RLE, but before PT donned sling, pt pulled self up on frame w/ PT quickly placing RLE on tray.  Pt placed in sling and performed partial squats into sling and then standing w/o UE use.  Pt requires increased time for initiation and for any translation needed.  Pt then moved to // bars for further RX.  Pt performed multiple sit to stand transfers in bars w/ mod to min A and min blocking of R knee.  Pt encouraged to push from arm rest for all sit <> stand transfers.  Spouse educated on reasoning.  Pt able to stand x 4-5' w/ R hand on bar and L arm at side, blocking R knee, verbal and tactile stim for quad activation R.  Pt w/ increased RR, c/o dizziness.  BP monitored in sitting at 146/64 (MAP 88) and then standing at 123/64 (MAP 80).  Dizziness/lightheadedness disippates w/ sitting.  Pt returned to room and transfers sit to stand w/ mod A and then mod A for step-pivot to bed w/ facilitation for weight shift.  Pt performed sit to supine transfer w/ max A but w/ cueing pt shifts weight to bring  LLE onto bed and then requires A for RLE.  Pt remained in bed w/ bed alarm on and all needs in reach, spouse and son present.  NT notified of need for brief change.     Therapy Documentation Precautions:  Precautions Precautions: Fall Precaution Comments: R hemi, coretrack, apraxic Restrictions Weight Bearing Restrictions: No General:   Vital Signs: Therapy Vitals Temp: 98 F (36.7 C) Pulse Rate: 66 Resp: 19 BP: 126/60 Patient Position (if appropriate): Sitting Oxygen Therapy SpO2: 98 % O2 Device: Room Air Pain:0/10 Pain Assessment Pain Scale: 0-10 Pain Score: 0-No pain     Therapy/Group: Individual Therapy  JLadoris Gene9/29/2023, 2:17 PM

## 2022-04-08 NOTE — Progress Notes (Addendum)
Speech Language Pathology Daily Session Note  Patient Details  Name: Elaine Ford MRN: [id number] Date of Birth: [birth year]  Today's Date: 04/08/2022 SLP Individual Time: 1100-1200 SLP Individual Time Calculation (min): 60 min  Short Term Goals: Week 1: SLP Short Term Goal 1 (Week 1): Pt will participate in therapeutic trials of ice chips with minimal s/sx concerning for aspiration given Max A to prepare for MBSS. - Following Mod A for oral care via suction toothbrush, pt accepted controlled ice chip trials and 1/4 tsp of thin water via tsp. No clinical s/sx concerning for airway compromise were observed. Vocal quality remained clear and dry post-swallow. In the absence of PO, pt did appear to exhibit some stridor though breathing was unlabored and even + VSS; ? If this is related to edematous appearance of pharynx during MBSS. Will plan to complete repeat MBSS next week, if pt continues to tolerate therapeutic trials during ST sessions.  SLP Short Term Goal 2 (Week 1): Pt will receptively ID common objects in field of 2 (vertical presentation) with 80% accuracy given Max A multimodal cues. - Pt receptively identified pictures of body parts and clothing items in a field of 2 (horizontal presentation) with 80% accuracy given overall Mod A multimodal cues.  SLP Short Term Goal 3 (Week 1): Pt will respond to basic and personal yes/no questions verbally or via alternate means with 80% accuracy given Max A multimodal cues. - Pt responded to basic and personal yes/no questions verbally and via alternate means (yes/no application on iPad) with ~50% accuracy given Max A multimodal cues.  SLP Short Term Goal 4 (Week 1): Pt will attend to functional and therapeutic tasks for 5 minutes given Mod A. - Pt attended to visual task in iPad (receptive ID task) for ~5 minutes given Min-Mod A verbal and visual cues.  SLP Short Term Goal 5 (Week 1): Pt will utilize AAC (low-tech picture board, gestures) to  communicate wants/needs given Max A multimodal cues. - Did not formally address this session.  SLP Short Term Goal 6 (Week 1): Pt will demonstrate orientation x 4 via multimodal communication given Max A multinodal cues. - Pt oriented to place and situation with Mod A verbal cues (binary verbal choice prompts). Total A for date.  Skilled Therapeutic Interventions: S: Pt seen this date for skilled ST intervention targeting swallowing and language goals outlined above. Pt received awake/alert and OOB in w/c; recently finished with OT. Pt's spouse and hospital interpreter present throughout. Agreeable to ST intervention in hospital room. Pt became more lethargic as session progressed.  O: Please see above for objective data re: pt's performance on targeted goals. SLP provided skilled pt and family education re: oral care recommendations (QID), ice chip protocol, s/sx concerning for airway invasion, ST POC, plan to complete instrumental swallow study next week, and pt's progress this session in comparison to 9/28.  A: Pt appears sitmulable for skilled ST intervention as evident by improvement of verbal responses when given binary choice prompts, visual supports (pictures and yes/no board), and model prompts. Recommend continuation of NPO with NGT + initiation of ice chip protocol (only providing 5-10 small ice chips when FULLY awake + following thorough oral care) for comfort and oropharyngeal conditioning; RN notified.   P: Pt left OOB in w/c with all safety measures activated. Call bell reviewed and within reach and all immediate needs met. Spouse remained at bedside. Continue per current ST POC next session.  Pain Pain Assessment Pain Scale: 0-10 Pain Score:  0-No pain  Therapy/Group: Individual Therapy  Sadey Yandell A Kaiesha Tonner 04/08/2022, 1:18 PM

## 2022-04-08 NOTE — Progress Notes (Signed)
Patient slept through the night.

## 2022-04-08 NOTE — Progress Notes (Signed)
   04/08/22 1135  Clinical Encounter Type  Visited With Patient and family together  Visit Type Initial;Other (Comment) (Advanced Directive)  Referral From Nurse  Consult/Referral To Chaplain   Chaplain responded to a spiritual consult for advanced directive education. The patient was receiving therapy at the time of my visit. I spoke with her husband providing the paperwork and inviting them to reach out if there were any questions that I can assist with. I also shared that when the documents were completed to let a nurse know and someone would respond making arrangements for a notary.    Danice Goltz [facility]  [phone]

## 2022-04-08 NOTE — Progress Notes (Signed)
Patient sitting up at 50 degrees. Tube feeding running as ordered. Bilateral arms elevated.

## 2022-04-09 LAB — GLUCOSE, CAPILLARY
Glucose-Capillary: 133 mg/dL — ABNORMAL HIGH (ref 70–99)
Glucose-Capillary: 137 mg/dL — ABNORMAL HIGH (ref 70–99)
Glucose-Capillary: 156 mg/dL — ABNORMAL HIGH (ref 70–99)
Glucose-Capillary: 160 mg/dL — ABNORMAL HIGH (ref 70–99)
Glucose-Capillary: 78 mg/dL (ref 70–99)

## 2022-04-09 NOTE — Progress Notes (Signed)
Speech Language Pathology Daily Session Note  Patient Details  Name: Elaine Ford MRN: [id number] Date of Birth: March 28, 1946  Today's Date: 04/09/2022 SLP Individual Time: 1305-1350 SLP Individual Time Calculation (min): 45 min  Short Term Goals: Week 1: SLP Short Term Goal 1 (Week 1): Pt will participate in therapeutic trials of ice chips with minimal s/sx concerning for aspiration given Max A to prepare for MBSS. SLP Short Term Goal 2 (Week 1): Pt will receptively ID common objects in field of 2 (vertical presentation) with 80% accuracy given Max A multimodal cues. SLP Short Term Goal 3 (Week 1): Pt will respond to basic and personal yes/no questions verbally or via alternate means with 80% accuracy given Max A multimodal cues. SLP Short Term Goal 4 (Week 1): Pt will attend to functional and therapeutic tasks for 5 minutes given Mod A. SLP Short Term Goal 5 (Week 1): Pt will utilize AAC (low-tech picture board, gestures) to communicate wants/needs given Max A multimodal cues SLP Short Term Goal 6 (Week 1): Pt will demonstrate orientation x 4 via multimodal communication given Max A multinodal cues.  Skilled Therapeutic Interventions: Skilled ST treatment focused on swallowing and language goals. Pt greeted in bed on arrival and accompanied by spouse. Interpreter present. Pt performed oral care via suction toothbrush and oral sponge with min A for thoroughness. Pt accepting of ice chip trials, 1/4 tsp of thin water by tsp, and up to 10cc of thin water via medicine cup. Pt exhibited what appeared to be good oral containment (one instance of R anterior spillage), a timely swallow response, and no clinical s/sx concerning for airway invasion among all tested volumes. Similar to previous encounter and documented by SLP, pt continues to exhibit some stridor. Breathing remained WFL for duration of session. Stridor was present prior to initiation of PO trials and appeared to wax and wane. Pt exhibited  difficulty initiating volitional swallow (faint), pt unable to initiate cough response. Toward end of session, pt appeared increasingly fatigued and exhibited facial grimace with swallowing. SLP asked yes/no questions and facilitated scanning through body parts to identify location of pain, pt identified that her throat was sore. May attribute to irritation from [place]. SLP informed RN. SLP assessed pt's vitals. Temp 97.9, BP 165/65, Pulse 63, SPO2 98%. Also notified RN of BP reading. Pt responded to basic yes/no questions pertaining to personal comfort and preferences with overall mod A verbal cues. Pt benefited from SLP's use of gestures. Pt was known to perseverate on words spoken by SLP on occasion. Per clarification of interpreter and spouse, pt communicated basic needs at the word and phrase/short sentence level. Pt continues to exhibit jargon and appeared to "forget what she was saying" mid sentence - per interpreter. Spouse felt speech was more comprehensible today. Recommend continuation of NPO with NGT + continuation of ice chip protocol (only providing 5-10 small ice chips when FULLY awake + following thorough oral care) for comfort and oropharyngeal conditioning. Patient was left in bed with alarm activated and immediate needs within reach at end of session. Continue per current plan of care.      Pain  Pt reported sore throat. SLP informed pt's nurse.   Therapy/Group: Individual Therapy  Loys Hoselton T Sadako Cegielski 04/09/2022, 3:12 PM

## 2022-04-09 NOTE — Progress Notes (Signed)
Occupational Therapy Session Note  Patient Details  Name: Elaine Ford MRN: [id number] Date of Birth: 07-11-1946  Today's Date: 04/09/2022 OT Individual Time: [phone] OT Individual Time Calculation (min): 72 min      Skilled Therapeutic Interventions/Progress Updates: Patient seen for self care as listed below. Worked on educating patient and SO on hemi dressing technique to encourage R side attention and encourage independence. Patient required hand overhand for threading RUE through sleeve of button up shirt. Continued with assist to bring shirt around her back and reach through with the LUE. Total assist to button. Patient educated on dressing RLE first into pant leg. Hand over hand assist provided to reach to R leg and therapist assist to lift RLE into pant leg. Patient able to stand with mod assist of one and able to help with clothing management. Continued therapy session in gym to work on neuro re education of the RUE focusing on activation of pre-reach skills and proximal stability. Patient with good scapular elevation and cues for adduction. AAROM into reach with support distally and cuing provided at triceps. Able to initiate anterior deltoid and triceps to extend arm forward. IR/ER facilitation with distal support. Patient's husband reports helping with AAROM and ranging fingers to preserve join integrity.Good participation and attention to the tasks.Continue with skilled OT POC.     Therapy Documentation Precautions:  Precautions Precautions: Fall Precaution Comments: R hemi, coretrack, apraxic Restrictions Weight Bearing Restrictions: No  General: Husband and interpreter present for  full Therapy session   Pain:No c/o pain during treatment   ADL: ADL Eating: NPO Grooming: Minimal assistance Where Assessed-Grooming: Wheelchair Upper Body Bathing: Dependent Where Assessed-Upper Body Bathing: Wheelchair Lower Body Bathing: Dependent  Where Assessed-Lower Body Bathing:  Wheelchair Upper Body Dressing: Dependent Where Assessed-Upper Body Dressing: Wheelchair Lower Body Dressing: Dependent Toileting: Dependent Where Assessed-Toileting: Bedside Commode Toilet Transfer: Maximal assistance Toilet Transfer Method: Stand pivot Tub/Shower Transfer: Not assessed Tub/Shower Transfer Method: Unable to assess Gaffer Transfer: Unable to assess       Therapy/Group: Individual Therapy  Hermina Barters 04/09/2022, 11:46 AM

## 2022-04-09 NOTE — IPOC Note (Signed)
Overall Plan of Care [facility]) Patient Details Name: Elaine Ford MRN: [id number] DOB: Apr 04, 1946  Admitting Diagnosis: Acute ischemic left middle cerebral artery (MCA) stroke [facility])  Hospital Problems: Principal Problem:   Acute ischemic left middle cerebral artery (MCA) stroke ([place])     Functional Problem List: Nursing Nutrition, Bladder, Bowel, Perception, Edema, Safety, Endurance, Sensory, Medication Management, Skin Integrity, Motor  PT Balance, Endurance, Motor, Nutrition, Safety, Sensory, Perception, Skin Integrity  OT Balance, Safety, Cognition, Sensory, Edema, Endurance, Motor, Vision  SLP Cognition, Linguistic, Motor, Nutrition, Sensory  TR         Basic ADL's: OT Eating, Grooming, Bathing, Dressing, Toileting     Advanced  ADL's: OT       Transfers: PT Bed Mobility, Bed to Chair, Teacher, early years/pre, Tub/Shower     Locomotion: PT Ambulation, Stairs     Additional Impairments: OT Fuctional Use of Upper Extremity  SLP Swallowing, Communication, Social Cognition comprehension, expression Memory, Attention, Awareness, Problem Solving  TR      Anticipated Outcomes Item Anticipated Outcome  Self Feeding SBA  Swallowing  Sup A   Basic self-care  Min A  Toileting  Min A   Bathroom Transfers Min A  Bowel/Bladder  incontinent x 2 with occational I/O cath, LBM 09/22  Transfers  CGA with LRAD  Locomotion  CGA with LRAD  Communication  Min A  Cognition  Min A for attention, orientation, and awareness  Pain  less than 3  Safety/Judgment  remain fall free while in rehab   Therapy Plan: PT Intensity: Minimum of 1-2 x/day ,45 to 90 minutes PT Frequency: 5 out of 7 days PT Duration Estimated Length of Stay: 2-3 weeks OT Intensity: Minimum of 1-2 x/day, 45 to 90 minutes OT Frequency: 5 out of 7 days OT Duration/Estimated Length of Stay: 2-3 weeks SLP Intensity: Minumum of 1-2 x/day, 30 to 90 minutes SLP Frequency: 3 to 5 out of 7 days SLP Duration/Estimated  Length of Stay: 2 weeks   Team Interventions: Nursing Interventions Patient/Family Education, Disease Management/Prevention, Skin Care/Wound Management, Discharge Planning, Bladder Management, Cognitive Remediation/Compensation, Psychosocial Support, Bowel Management, Medication Management, Dysphagia/Aspiration Precaution Training  PT interventions Ambulation/gait training, Discharge planning, Functional mobility training, Psychosocial support, Therapeutic Activities, Visual/perceptual remediation/compensation, Wheelchair propulsion/positioning, Therapeutic Exercise, Skin care/wound management, Neuromuscular re-education, Disease management/prevention, Training and development officer, Cognitive remediation/compensation, DME/adaptive equipment instruction, Pain management, Splinting/orthotics, UE/LE Strength taining/ROM, UE/LE Coordination activities, Stair training, Patient/family education, Functional electrical stimulation, Community reintegration  OT Interventions Training and development officer, Discharge planning, Pain management, Self Care/advanced ADL retraining, Therapeutic Activities, UE/LE Coordination activities, Cognitive remediation/compensation, Functional mobility training, Patient/family education, Therapeutic Exercise, Visual/perceptual remediation/compensation, Wheelchair propulsion/positioning, UE/LE Strength taining/ROM, Splinting/orthotics, Psychosocial support, Neuromuscular re-education, DME/adaptive equipment instruction, Community reintegration  SLP Interventions Dysphagia/aspiration precaution training, Speech/Language facilitation, Therapeutic Activities, Patient/family education  TR Interventions    SW/CM Interventions Discharge Planning, Psychosocial Support, Patient/Family Education   Barriers to Discharge MD  Medical stability, Home enviroment access/loayout, Incontinence, Wound care, Lack of/limited family support, Medication compliance, Behavior, and Nutritional means  Nursing  Decreased caregiver support, Home environment access/layout, Incontinence, Wound Care, Weight, Weight bearing restrictions, Nutrition means Lives with spouse 2 level house B/B main level with 1 ste 0 rails, alt entry has 13 ste rail on L  PT Decreased caregiver support, Lack of/limited family support, Home environment access/layout, Incontinence, Weight, Insurance for SNF coverage, Nutrition means    OT Home environment access/layout, Incontinence, Weight    SLP Nutrition means    SW Insurance for SNF coverage  Team Discharge Planning: Destination: PT-Home ,OT- Home , SLP-Home Projected Follow-up: PT-24 hour supervision/assistance, OT-  Home health OT, SLP-Home Health SLP, Outpatient SLP, 24 hour supervision/assistance Projected Equipment Needs: PT-To be determined, OT- 3 in 1 bedside comode, Wheelchair cushion (measurements), Wheelchair (measurements), Tub/shower bench, SLP-None recommended by SLP Equipment Details: PT- , OT-  Patient/family involved in discharge planning: PT- Patient, Family member/caregiver,  OT-Family member/caregiver, SLP-Patient, Family member/caregiver  MD ELOS: 14-20 days Medical Rehab Prognosis:  Good Assessment: The patient has been admitted for CIR therapies with the diagnosis of L MCA stroke. The team will be addressing functional mobility, strength, stamina, balance, safety, adaptive techniques and equipment, self-care, bowel and bladder mgt, patient and caregiver education. Goals have been set at [facility] PT, Min A OT, and Max A SLP. Anticipated discharge destination is home.       See Team Conference Notes for weekly updates to the plan of care

## 2022-04-09 NOTE — Progress Notes (Signed)
Physical Therapy Session Note  Patient Details  Name: Elaine Ford MRN: [id number] Date of Birth: 02-14-1946  Today's Date: 04/09/2022 PT Individual Time: 1100-1210 PT Individual Time Calculation (min): 70 min   Short Term Goals: Week 1:  PT Short Term Goal 1 (Week 1): Pt will complete bed mobility with modA PT Short Term Goal 2 (Week 1): Pt will complete bed<>chair transfers with modA and LRAD PT Short Term Goal 3 (Week 1): Pt will ambulate 71f with modA and LRAD PT Short Term Goal 4 (Week 1): Pt will initiate stair training PT Short Term Goal 5 (Week 1): Pt will participate in functional outcome measure to assess falls risk  Skilled Therapeutic Interventions/Progress Updates:     Pt received sitting in w/c with her husband present and in-person interpreter, Tim, present throughout session - pt able to comprehend simple phrases from therapist in [place] responds in [place] Pt agreeable to therapy session. Pt's husband present for session because he is eager to learn how to safely assist pt; therefore, allowed him to participate throughout.  Transported to/from gym in w/c for time management and energy conservation.  Pt noted to have increased production of saliva with spillage on R side benefiting from use of washcloth throughout session to manage this.  R partial stand pivot transfer w/c>EOM with mod assist for balance and manual facilitation to assist with R lateral step to complete the transfer, guarding R knee during stance but no significant buckling noted.  Once on EOM, pt noted to be soiled. L stand pivot EOM>w/c with mod assist again for balance and managing R LE stepping. Transported back to room.   Sit>stand w/c>stedy with light mod assist for R UE management, lifting, and balance. Stedy transfer in/out bathroom. Sit<>stand to/from stedy seat with min assist primarily for R UE support and balance/trunk control as pt starts to demonstrate R lateral trunk lean that worsens  with prolonged standing due to fatigue - pt tolerated standing ~531mutes x2. Standing with min assist for balance and R UE management from her husband during dependent peri-care and LB clothing management.  Pt had been incontinent of bladder and bowels - MD present during session therefore discussed bowel and bladder timed toileting program with her and the nursing staff to use +2 assist with stedy to take pt to/from bathroom every 2-3hrs for continent voiding.  Transported back to gym.   Sit>stand w/c>L UE support on hallway rail with light mod/min assist for rising to stand. Gait training 3062ft L hallway rail with mod assist of 1 and +2 w/c follow with pt's husband standing in front of her also providing cuing for pt to keep trunk upright and sequence LE steps - pt able to advance R LE ~25% of the way and stabilize in stance with light guarding turning into min/mod block towards end due to fatigue - pt starts with step-to pattern leading with R LE but then progresses towards slight reciprocal pattern. Pt does well maintaining balance, keeping trunk upright, and weight shifting without significant assistance.  Pt reporting fatigue. Transported back to room. R stand pivot w/c>EOB as described above. Sit>supine with mod assist for R hemibody management and trunk upright. Pt positioned with HOB >30degrees for safety and left with needs in reach, lines intact, R UE supported on pillow, her husband present, and bed alarm on.    Therapy Documentation Precautions:  Precautions Precautions: Fall Precaution Comments: R hemi, coretrack, apraxic Restrictions Weight Bearing Restrictions: No   Pain: Reports increased  sensitivity to touch in L UE due to hx of pacemaker placement but otherwise no complaints of pain.   Therapy/Group: Individual Therapy  Tawana Scale , PT, DPT, NCS, CSRS 04/09/2022, 8:00 AM

## 2022-04-10 LAB — URINALYSIS, ROUTINE W REFLEX MICROSCOPIC
Bilirubin Urine: NEGATIVE
Glucose, UA: NEGATIVE mg/dL
Ketones, ur: NEGATIVE mg/dL
Leukocytes,Ua: NEGATIVE
Nitrite: NEGATIVE
Protein, ur: NEGATIVE mg/dL
RBC / HPF: >50 RBC/hpf — ABNORMAL HIGH (ref 0–5)
Specific Gravity, Urine: 1.014 (ref 1.005–1.030)
pH: 8 (ref 5.0–8.0)

## 2022-04-10 LAB — GLUCOSE, CAPILLARY
Glucose-Capillary: 127 mg/dL — ABNORMAL HIGH (ref 70–99)
Glucose-Capillary: 131 mg/dL — ABNORMAL HIGH (ref 70–99)
Glucose-Capillary: 131 mg/dL — ABNORMAL HIGH (ref 70–99)
Glucose-Capillary: 133 mg/dL — ABNORMAL HIGH (ref 70–99)
Glucose-Capillary: 134 mg/dL — ABNORMAL HIGH (ref 70–99)
Glucose-Capillary: 143 mg/dL — ABNORMAL HIGH (ref 70–99)

## 2022-04-10 NOTE — Progress Notes (Signed)
PROGRESS NOTE   Subjective/Complaints:  Pt seen in PT yesterday, no acute complaints. Therapies noting frequent incontinent episodes and requesting timed toileting. Today, seen in room during therapies, with recent incontinent episode. Family endorses no other concerns.   ROS: Difficult to obtain d/t cognition, language barrier.  Objective:   No results found.  Recent Labs    04/08/22 0656  WBC 11.5*  HGB 12.2  HCT 36.5  PLT 315    No results for input(s): "NA", "K", "CL", "CO2", "GLUCOSE", "BUN", "CREATININE", "CALCIUM" in the last 72 hours.  No intake or output data in the 24 hours ending 04/10/22 1945       Physical Exam: Vital Signs Blood pressure (!) 146/64, pulse 65, temperature 97.6 F (36.4 C), temperature source Oral, resp. rate 18, height '5\' 4"'$  (1.626 m), weight 88 kg, SpO2 97 %. Vitals and nursing note reviewed.   General: No acute distress Mood and affect are appropriate Heart: Regular rate and rhythm no rubs murmurs or extra sounds Lungs: CTAB. No rales, rhonchi, or wheezing  Abdomen: Positive bowel sounds, soft nontender to palpation, nondistended Extremities: No clubbing, cyanosis, or edema Skin: No evidence of breakdown, no evidence of rash Musculoskeletal: All 4 limbs antigravity, ambulating with RW and gait belt.    PE from prior encounters: Constitutional:      Comments: Up in chair with cortak in nares. Gurgling/sonorous upper airway sounds noted.   HEENT: NGT in place Neurological:  Neurologic: Cranial nerves II through XII intact, motor strength is 5/5 in left deltoid, bicep, tricep, grip, hip flexor, knee extensors, ankle dorsiflexor and plantar flexor Trace finger flexin RUE o/w 0/5 3- Right HF, KE, 2- ankle PT/DF Sensory exam reduced light touch LEFT upper and lower extremities      Mental Status: She is alert.     Comments: Right inattention with decrease in EOMI to the right.  Right facial weakness with wet/congested/sonorous upper airway sounds.  Tends to keep mouth open with reports of sore throat. Receptive> expressive deficits with delay in processing. She was able to initiate output with max cues but noted to have perseverative echolaly. Occasional social utterance. Unable to point or state her or recognize her son's name. Able to follow simple motor commands with visual and tactile cues.       Assessment/Plan: 1. Functional deficits which require 3+ hours per day of interdisciplinary therapy in a comprehensive inpatient rehab setting. Physiatrist is providing close team supervision and 24 hour management of active medical problems listed below. Physiatrist and rehab team continue to assess barriers to discharge/monitor patient progress toward functional and medical goals  Care Tool:  Bathing    Body parts bathed by patient: Right arm, Chest, Abdomen, Face   Body parts bathed by helper: Right arm, Right upper leg, Left arm, Left upper leg, Chest, Abdomen, Right lower leg, Front perineal area, Left lower leg, Buttocks, Face     Bathing assist Assist Level: Total Assistance - Patient < 25%     Upper Body Dressing/Undressing Upper body dressing   What is the patient wearing?: Button up shirt    Upper body assist Assist Level: Total Assistance - Patient < 25%  Lower Body Dressing/Undressing Lower body dressing      What is the patient wearing?: Pants, Incontinence brief     Lower body assist Assist for lower body dressing: Total Assistance - Patient < 25%     Toileting Toileting Toileting Activity did not occur Landscape architect and hygiene only): N/A (no void or bm)  Toileting assist Assist for toileting: 2 Helpers     Transfers Chair/bed transfer  Transfers assist  Chair/bed transfer activity did not occur: Safety/medical concerns  Chair/bed transfer assist level: Moderate Assistance - Patient 50 - 74%      Locomotion Ambulation   Ambulation assist      Assist level: Moderate Assistance - Patient 50 - 74% Assistive device: Parallel bars Max distance: 71f   Walk 10 feet activity   Assist  Walk 10 feet activity did not occur: Safety/medical concerns        Walk 50 feet activity   Assist Walk 50 feet with 2 turns activity did not occur: Safety/medical concerns         Walk 150 feet activity   Assist Walk 150 feet activity did not occur: Safety/medical concerns         Walk 10 feet on uneven surface  activity   Assist Walk 10 feet on uneven surfaces activity did not occur: Safety/medical concerns         Wheelchair     Assist Is the patient using a wheelchair?: Yes Type of Wheelchair: Manual    Wheelchair assist level: Dependent - Patient 0% Max wheelchair distance: 042f   Wheelchair 50 feet with 2 turns activity    Assist        Assist Level: Dependent - Patient 0%   Wheelchair 150 feet activity     Assist      Assist Level: Dependent - Patient 0%   Blood pressure (!) 146/64, pulse 65, temperature 97.6 F (36.4 C), temperature source Oral, resp. rate 18, height '5\' 4"'$  (1.626 m), weight 88 kg, SpO2 97 %.    Medical Problem List and Plan: 1. Functional deficits secondary to Left MCA infarct             -patient may  shower             -ELOS/Goals: 14-16d MinA  Start vitamin B/C supplement 2.  Antithrombotics: -DVT/anticoagulation:  Mechanical: Sequential compression devices, below knee Bilateral lower extremities             -antiplatelet therapy: DAPT X 3 weeks followed by ASA alone.  3. Pain Management:  Tylenol prn.  4. Mood/Behavior/Sleep: LCSW to follow for evaluation and support.              -antipsychotic agents: N/A 5. Neuropsych/cognition: This patient is not fully capable of making decisions on her own behalf. 6. Skin/Wound Care: Routine pressure relief measures.  7. Fluids/Electrolytes/Nutrition: Monitor I/O.  Check CMET in am. 8.  HTN: Monitor BP TID--continue coreg with BP goal <160. 9. Dysphagia: Continue NPO with tube feeds for nutritional support.  10. Hyperglycemia: Hgb A1C-            10/1 - consistent control; monitor Recent Labs    04/10/22 1136 04/10/22 1633 04/10/22 2007  GLUCAP 134* 131* 131*     11 L-BBB s/p PPM: Brief episode of A flutter. Continue to monitor. 12. Hyperlipidemia: Continue Lipitor 13.  ABLA: Hgb down from 15.1-->12.9-->12.4. will monitor for signs of bleeding.  Repeat hgb tomorrow, ordered heme occult           -  10/1: Daily Bms without collection; requested on next BM to nursing  14. UTI: Keflex started. F/u UC             - 10/1 no results in chart, appears to not have been performed. Pt has remained afebrile, HD stable, but with urinary frequency/incontinence.             - CBC in AM and repeat UA/UCx ordered            - timed toileting Q3H to limit incontinent episodes  15. Chest congestion: chest PT ordered. Decrease TF rate to 55/hour to avoid fluid overload 16. Obesity: BMI 32: provide dietary education   LOS: 4 days A FACE TO [place] 04/10/2022, 7:45 PM

## 2022-04-11 DIAGNOSIS — R1310 Dysphagia, unspecified: Secondary | ICD-10-CM | POA: Diagnosis not present

## 2022-04-11 DIAGNOSIS — D649 Anemia, unspecified: Secondary | ICD-10-CM | POA: Diagnosis not present

## 2022-04-11 DIAGNOSIS — D72829 Elevated white blood cell count, unspecified: Secondary | ICD-10-CM | POA: Diagnosis not present

## 2022-04-11 DIAGNOSIS — I63512 Cerebral infarction due to unspecified occlusion or stenosis of left middle cerebral artery: Secondary | ICD-10-CM | POA: Diagnosis not present

## 2022-04-11 LAB — GLUCOSE, CAPILLARY
Glucose-Capillary: 115 mg/dL — ABNORMAL HIGH (ref 70–99)
Glucose-Capillary: 119 mg/dL — ABNORMAL HIGH (ref 70–99)
Glucose-Capillary: 144 mg/dL — ABNORMAL HIGH (ref 70–99)
Glucose-Capillary: 144 mg/dL — ABNORMAL HIGH (ref 70–99)
Glucose-Capillary: 157 mg/dL — ABNORMAL HIGH (ref 70–99)
Glucose-Capillary: 93 mg/dL (ref 70–99)

## 2022-04-11 LAB — BASIC METABOLIC PANEL
Anion gap: 7 (ref 5–15)
BUN: 16 mg/dL (ref 8–23)
CO2: 25 mmol/L (ref 22–32)
Calcium: 8.8 mg/dL — ABNORMAL LOW (ref 8.9–10.3)
Chloride: 104 mmol/L (ref 98–111)
Creatinine, Ser: 0.76 mg/dL (ref 0.44–1.00)
GFR, Estimated: >60 mL/min (ref >60)
Glucose, Bld: 136 mg/dL — ABNORMAL HIGH (ref 70–99)
Potassium: 4.2 mmol/L (ref 3.5–5.1)
Sodium: 136 mmol/L (ref 135–145)

## 2022-04-11 LAB — CBC
HCT: 36.3 % (ref 36.0–46.0)
Hemoglobin: 11.7 g/dL — ABNORMAL LOW (ref 12.0–15.0)
MCH: 27.4 pg (ref 26.0–34.0)
MCHC: 32.2 g/dL (ref 30.0–36.0)
MCV: 85 fL (ref 80.0–100.0)
Platelets: 341 10*3/uL (ref 150–400)
RBC: 4.27 MIL/uL (ref 3.87–5.11)
RDW: 14.5 % (ref 11.5–15.5)
WBC: 10 10*3/uL (ref 4.0–10.5)
nRBC: 0 % (ref 0.0–0.2)

## 2022-04-11 MED ORDER — WITCH HAZEL-GLYCERIN EX PADS: MEDICATED_PAD | CUTANEOUS | Status: DC | PRN

## 2022-04-11 MED ORDER — OSMOLITE 1.5 CAL PO LIQD
1000.0000 mL | ORAL | Status: DC
  Administered 2022-04-11: 1000 mL
  Filled 2022-04-11 (×2): qty 1000

## 2022-04-11 NOTE — Progress Notes (Signed)
Speech Language Pathology Daily Session Note  Patient Details  Name: Elaine Ford MRN: [id number] Date of Birth: 26-May-1946  Today's Date: 04/11/2022 SLP Individual Time: 1115-1200 SLP Individual Time Calculation (min): 45 min  Short Term Goals: Week 1: SLP Short Term Goal 1 (Week 1): Pt will participate in therapeutic trials of ice chips with minimal s/sx concerning for aspiration given Max A to prepare for MBSS. - Following Mod A for oral care via suction toothbrush, pt accepted small ice chips with no clinical s/sx concerning for aspiration. Intermittent throat clear observed with thin water via tsp and 5 cc volume via medicine cup, though vocal quality dry following throat clear. Improving secretion management noted with less loss on R.  SLP Short Term Goal 2 (Week 1): Pt will receptively ID common objects in field of 2 (vertical presentation) with 80% accuracy given Max A multimodal cues. - 80% accuracy given Min A.  SLP Short Term Goal 3 (Week 1): Pt will respond to basic and personal yes/no questions verbally or via alternate means with 80% accuracy given Max A multimodal cues. - ~70% accuracy given Mod A verbal and visual cues.  SLP Short Term Goal 4 (Week 1): Pt will attend to functional and therapeutic tasks for 5 minutes given Mod A. - 5 minutes given Min A  SLP Short Term Goal 5 (Week 1): Pt will utilize AAC (low-tech picture board, gestures) to communicate wants/needs given Max A multimodal cues - Did not formally address this session.  SLP Short Term Goal 6 (Week 1): Pt will demonstrate orientation x 4 via multimodal communication given Max A multinodal cues. - Did not formally address this session.  Skilled Therapeutic Interventions: S: Pt seen this date for skilled ST intervention targeting swallowing and communication goals outlined above. Pt received awake/alert and OOB in w/c; recently finished with OT. Spouse present and translated during today's session. Agreeable to ST  intervention in hospital room.  O: Please see above for objective data re: pt's performance on targeted goals. SLP provided skilled education re: plan for instrumental swallow study next date, aphasia, and ST POC. Education will continue to be reinforced. Pt continues to communicate mostly in [place], though is in comprehensible, at times, due to aphasia. Minimally stimulable for increasing vocal intensity despite Max A verbal cues and model prompts.  A: Pt remains sitmulable for skilled ST intervention as evident by improvement in receptive identification of common nouns and responses to yes/no questions.. Recommend re-assessment of swallow function via instrumental swallow study next date.  P: Pt left in room + OOB in w/c with all safety measures activated. Call bell reviewed and within reach and all immediate needs met. Spouse remained at bedside with NT coming in for glucose check. Continue per current ST POC next session.   Pain Pain Assessment Pain Scale: 0-10 Pain Score: 0-No pain Faces Pain Scale: No hurt  Therapy/Group: Individual Therapy  Venise Ellingwood A Kaydi Kley 04/11/2022, 1:46 PM

## 2022-04-11 NOTE — Progress Notes (Signed)
Occupational Therapy Session Note  Patient Details  Name: Elaine Ford MRN: [id number] Date of Birth: 1945/12/13  Today's Date: 04/11/2022 OT Individual Time: 0951-1100 OT Individual Time Calculation (min): 69 min    Short Term Goals: Week 1:  OT Short Term Goal 1 (Week 1): Pt will increase UB dressing and bathing to Max A while demonstrating use of hemi dressing techniques and compensatory strategies. OT Short Term Goal 2 (Week 1): Pt will increase LB bathing and dressing while utilizing AE as needed to increase independence. OT Short Term Goal 3 (Week 1): Pt will utilize her RUE as a stabilizer when completing self feed and grooming tasks. OT Short Term Goal 4 (Week 1): Pt will increase toilet transfer to [place] Therapeutic Interventions/Progress Updates:    Patient received supine in bed, nursing attending to needs.  Patient had indicated need to void, assisted patient to edge of bed, and to wheelchair.  Patient transferred to commode with mod assist.  If given time, patient completing stand step transfer.  Patient unable to void - brief saturated with urine.  Patient agreeable to shower.  Transferred to shower bench with mod assist.  Patient responds well to simple one step cues.   Patient assisted with all aspects of bathing, and able to stand to wash peri area.  Patient dressed and completed hygiene at sink.  Patient with significant improvement in attention today.  Focused on attempting to button her pajamas for several minutes, and could actually tell when she was successful.   Patient is on water protocol.  Had her complete thorough oral care, then offered sips of water via medicine cup.  Patient left up in wheelchair with safety belt alarm in place and engaged.  Husband at her side.    Therapy Documentation Precautions:  Precautions Precautions: Fall Precaution Comments: R hemi, coretrack, apraxic Restrictions Weight Bearing Restrictions: No   Pain: Pain  Assessment Pain Scale: 0-10 Pain Score: 0-No pain Faces Pain Scale: No hurt     Therapy/Group: Individual Therapy  Mariah Milling 04/11/2022, 12:54 PM

## 2022-04-11 NOTE — Progress Notes (Signed)
Physical Therapy Session Note  Patient Details  Name: Elaine Ford MRN: [id number] Date of Birth: [birth year]  Today's Date: 04/11/2022 PT Individual Time: 1300-1415 PT Individual Time Calculation (min): 75 min   Short Term Goals: Week 1:  PT Short Term Goal 1 (Week 1): Pt will complete bed mobility with modA PT Short Term Goal 2 (Week 1): Pt will complete bed<>chair transfers with modA and LRAD PT Short Term Goal 3 (Week 1): Pt will ambulate 61f with modA and LRAD PT Short Term Goal 4 (Week 1): Pt will initiate stair training PT Short Term Goal 5 (Week 1): Pt will participate in functional outcome measure to assess falls risk  Skilled Therapeutic Interventions/Progress Updates: Pt presents sitting in w/c w/ family present and agreeable to therapy.  Pt needing to use BR.  Pt wheeled to BR and performed SPT w/ max A and manual facilitation for weight shift to R.  Pt transfers sit to stand w/ mod A throughout session although verbal and visual cues for hand placement on arm rest.  Pt requires traslation from husband for upright posture and to back up to seat.  Pt wheeled to main gym for energy conservation.  Pt amb x 25' w/ max A and HHA, although constant verbal cueing from spouse for posture and use of HHA.  Pt requires manual facilitation for weight shift to advance RLE along w/ verbal cueing for sequencing.  Pt w/o buckling to R knee but circumduction noted and decreased BOS.  Pt amb x 3 more trials while PT lowering w/c.  Pt amb x 5' in room to bed w/ constant cueing for sequencing.  Pt then cued to transfer sit to supine w/ mod A for RLE.  Pt bridged to center of bed w/ manual assist for RLE placement/maintenance.  Bed alarm on and all needs in reach, spouse and son remain in room.     Therapy Documentation Precautions:  Precautions Precautions: Fall Precaution Comments: R hemi, coretrack, apraxic Restrictions Weight Bearing Restrictions: No General:   Vital Signs:  Pain:c/o pain  w/ movement of R UE but unable to quantify. Pain Assessment Pain Scale: 0-10 Pain Score: 0-No pain Faces Pain Scale: No hurt Mobility:     Therapy/Group: Individual Therapy  JLadoris Gene10/08/2021, 2:17 PM

## 2022-04-11 NOTE — Progress Notes (Signed)
PROGRESS NOTE   Subjective/Complaints: Appears much more alert and less congested today. CBGs elevated due to TF- discussed with husband. Will decrease TF rate to 50 She had left arm pain but that has improved Discussed lab work with her  ROS: husband notes chest congestion- improved, +fatigue, left arm pain resolved  Objective:   No results found.  Recent Labs    04/11/22 0713  WBC 10.0  HGB 11.7*  HCT 36.3  PLT 341   Recent Labs    04/11/22 0713  NA 136  K 4.2  CL 104  CO2 25  GLUCOSE 136*  BUN 16  CREATININE 0.76  CALCIUM 8.8*   No intake or output data in the 24 hours ending 04/11/22 0940       Physical Exam: Vital Signs Blood pressure (!) 160/53, pulse 66, temperature 97.8 F (36.6 C), temperature source Oral, resp. rate 18, height '5\' 4"'$  (1.626 m), weight 88.4 kg, SpO2 98 %. Vitals and nursing note reviewed.  Constitutional:      Comments: Up in chair with cortak in nares. Gurgling/sonorous upper airway sounds noted.   HEENT: NGT in place Neurological:     Mental Status: She is alert.     Comments: Right inattention with decrease in EOMI to the right. Right facial weakness with wet/congested/sonorous upper airway sounds.  Tends to keep mouth open with reports of sore throat. Receptive> expressive deficits with delay in processing. She was able to initiate output with max cues but noted to have perseverative echolaly. Occasional social utterance. Unable to point or state her or recognize her son's name. Able to follow simple motor commands with visual and tactile cues.     General: No acute distress Mood and affect are appropriate Heart: Regular rate and rhythm no rubs murmurs or extra sounds Lungs: upper airway sounds o/w Clear to auscultation, breathing unlabored, no rales or wheezes, appears to have chest congestion Abdomen: Positive bowel sounds, soft nontender to palpation,  nondistended Extremities: No clubbing, cyanosis, or edema Skin: No evidence of breakdown, no evidence of rash Neurologic: More alert and talkative. Cranial nerves II through XII intact, motor strength is 5/5 in left deltoid, bicep, tricep, grip, hip flexor, knee extensors, ankle dorsiflexor and plantar flexor Trace finger flexin RUE o/w 0/5 3- Right HF, KE, 2- ankle PT/DF Sensory exam reduced light touch LEFT upper and lower extremities   Musculoskeletal: no pain with  range of motion in all 4 extremities. No joint swelling      Assessment/Plan: 1. Functional deficits which require 3+ hours per day of interdisciplinary therapy in a comprehensive inpatient rehab setting. Physiatrist is providing close team supervision and 24 hour management of active medical problems listed below. Physiatrist and rehab team continue to assess barriers to discharge/monitor patient progress toward functional and medical goals  Care Tool:  Bathing    Body parts bathed by patient: Right arm, Chest, Abdomen, Face   Body parts bathed by helper: Right arm, Right upper leg, Left arm, Left upper leg, Chest, Abdomen, Right lower leg, Front perineal area, Left lower leg, Buttocks, Face     Bathing assist Assist Level: Total Assistance - Patient < 25%  Upper Body Dressing/Undressing Upper body dressing   What is the patient wearing?: Button up shirt    Upper body assist Assist Level: Total Assistance - Patient < 25%    Lower Body Dressing/Undressing Lower body dressing      What is the patient wearing?: Pants, Incontinence brief     Lower body assist Assist for lower body dressing: Total Assistance - Patient < 25%     Toileting Toileting Toileting Activity did not occur Landscape architect and hygiene only): N/A (no void or bm)  Toileting assist Assist for toileting: 2 Helpers     Transfers Chair/bed transfer  Transfers assist  Chair/bed transfer activity did not occur: Safety/medical  concerns  Chair/bed transfer assist level: Moderate Assistance - Patient 50 - 74%     Locomotion Ambulation   Ambulation assist      Assist level: Moderate Assistance - Patient 50 - 74% Assistive device: Parallel bars Max distance: 31f   Walk 10 feet activity   Assist  Walk 10 feet activity did not occur: Safety/medical concerns        Walk 50 feet activity   Assist Walk 50 feet with 2 turns activity did not occur: Safety/medical concerns         Walk 150 feet activity   Assist Walk 150 feet activity did not occur: Safety/medical concerns         Walk 10 feet on uneven surface  activity   Assist Walk 10 feet on uneven surfaces activity did not occur: Safety/medical concerns         Wheelchair     Assist Is the patient using a wheelchair?: Yes Type of Wheelchair: Manual    Wheelchair assist level: Dependent - Patient 0% Max wheelchair distance: 085f   Wheelchair 50 feet with 2 turns activity    Assist        Assist Level: Dependent - Patient 0%   Wheelchair 150 feet activity     Assist      Assist Level: Dependent - Patient 0%   Blood pressure (!) 160/53, pulse 66, temperature 97.8 F (36.6 C), temperature source Oral, resp. rate 18, height '5\' 4"'$  (1.626 m), weight 88.4 kg, SpO2 98 %.    Medical Problem List and Plan: 1. Functional deficits secondary to Left MCA infarct             -patient may  shower             -ELOS/Goals: 14-16d MinA  Start vitamin B/C supplement  Continue CIR 2.  Antithrombotics: -DVT/anticoagulation:  Mechanical: Sequential compression devices, below knee Bilateral lower extremities             -antiplatelet therapy: DAPT X 3 weeks followed by ASA alone.  3. Pain Management:  Tylenol prn.  4. Mood/Behavior/Sleep: LCSW to follow for evaluation and support.              -antipsychotic agents: N/A 5. Neuropsych/cognition: This patient is not fully capable of making decisions on her own  behalf. 6. Skin/Wound Care: Routine pressure relief measures.  7. Fluids/Electrolytes/Nutrition: Monitor I/O. Check CMET in am. 8.  HTN: Monitor BP TID--continue coreg with BP goal <160. 9. Dysphagia: Continue NPO with tube feeds for nutritional support.  10. Hyperglycemia: Hgb A1C- 11 L-BBB s/p PPM: Brief episode of A flutter. Continue to monitor. 12. Hyperlipidemia: Continue Lipitor 13.  ABLA: Hgb down from 15.1-->12.9-->12.4-->11.7 will monitor for signs of bleeding.  Repeat hgb tomorrow, ordered heme occult, messaged nursing  as I do not see that this has been sent 14. UTI: Keflex started. F/u UC (in process) 15. Chest congestion: chest PT ordered. Decrease TF rate to 50/hour to avoid fluid overload 16. Obesity: BMI 32-->33.45: provide dietary education, decrease TF rate to avoid fluid overload, appears less congested with decrease 17. Hemorrhoid: witch hazel pads ordered.    LOS: 5 days A FACE TO FACE EVALUATION WAS PERFORMED  Clide Deutscher Mckinze Poirier 04/11/2022, 9:40 AM

## 2022-04-12 ENCOUNTER — Inpatient Hospital Stay (HOSPITAL_COMMUNITY): Payer: Medicare HMO

## 2022-04-12 DIAGNOSIS — D649 Anemia, unspecified: Secondary | ICD-10-CM | POA: Diagnosis not present

## 2022-04-12 DIAGNOSIS — R1310 Dysphagia, unspecified: Secondary | ICD-10-CM | POA: Diagnosis not present

## 2022-04-12 DIAGNOSIS — D72829 Elevated white blood cell count, unspecified: Secondary | ICD-10-CM | POA: Diagnosis not present

## 2022-04-12 DIAGNOSIS — I63512 Cerebral infarction due to unspecified occlusion or stenosis of left middle cerebral artery: Secondary | ICD-10-CM | POA: Diagnosis not present

## 2022-04-12 LAB — GLUCOSE, CAPILLARY
Glucose-Capillary: 108 mg/dL — ABNORMAL HIGH (ref 70–99)
Glucose-Capillary: 123 mg/dL — ABNORMAL HIGH (ref 70–99)
Glucose-Capillary: 123 mg/dL — ABNORMAL HIGH (ref 70–99)
Glucose-Capillary: 132 mg/dL — ABNORMAL HIGH (ref 70–99)
Glucose-Capillary: 145 mg/dL — ABNORMAL HIGH (ref 70–99)
Glucose-Capillary: 86 mg/dL (ref 70–99)

## 2022-04-12 LAB — URINALYSIS, ROUTINE W REFLEX MICROSCOPIC
Bilirubin Urine: NEGATIVE
Glucose, UA: NEGATIVE mg/dL
Ketones, ur: NEGATIVE mg/dL
Leukocytes,Ua: NEGATIVE
Nitrite: NEGATIVE
Protein, ur: NEGATIVE mg/dL
RBC / HPF: >50 RBC/hpf — ABNORMAL HIGH (ref 0–5)
Specific Gravity, Urine: 1.013 (ref 1.005–1.030)
pH: 7 (ref 5.0–8.0)

## 2022-04-12 LAB — CBC WITH DIFFERENTIAL/PLATELET
Abs Immature Granulocytes: 0.02 10*3/uL (ref 0.00–0.07)
Basophils Absolute: 0 10*3/uL (ref 0.0–0.1)
Basophils Relative: 0 %
Eosinophils Absolute: 0.1 10*3/uL (ref 0.0–0.5)
Eosinophils Relative: 2 %
HCT: 36.6 % (ref 36.0–46.0)
Hemoglobin: 11.8 g/dL — ABNORMAL LOW (ref 12.0–15.0)
Immature Granulocytes: 0 %
Lymphocytes Relative: 17 %
Lymphs Abs: 1.6 10*3/uL (ref 0.7–4.0)
MCH: 27.4 pg (ref 26.0–34.0)
MCHC: 32.2 g/dL (ref 30.0–36.0)
MCV: 84.9 fL (ref 80.0–100.0)
Monocytes Absolute: 0.6 10*3/uL (ref 0.1–1.0)
Monocytes Relative: 6 %
Neutro Abs: 7.2 10*3/uL (ref 1.7–7.7)
Neutrophils Relative %: 75 %
Platelets: 348 10*3/uL (ref 150–400)
RBC: 4.31 MIL/uL (ref 3.87–5.11)
RDW: 14.6 % (ref 11.5–15.5)
WBC: 9.5 10*3/uL (ref 4.0–10.5)
nRBC: 0 % (ref 0.0–0.2)

## 2022-04-12 LAB — URINE CULTURE: Culture: 20000 — AB

## 2022-04-12 LAB — BASIC METABOLIC PANEL
Anion gap: 10 (ref 5–15)
BUN: 18 mg/dL (ref 8–23)
CO2: 26 mmol/L (ref 22–32)
Calcium: 9 mg/dL (ref 8.9–10.3)
Chloride: 101 mmol/L (ref 98–111)
Creatinine, Ser: 0.72 mg/dL (ref 0.44–1.00)
GFR, Estimated: >60 mL/min (ref >60)
Glucose, Bld: 135 mg/dL — ABNORMAL HIGH (ref 70–99)
Potassium: 4.1 mmol/L (ref 3.5–5.1)
Sodium: 137 mmol/L (ref 135–145)

## 2022-04-12 LAB — OCCULT BLOOD X 1 CARD TO LAB, STOOL: Fecal Occult Bld: NEGATIVE

## 2022-04-12 MED ORDER — LIDOCAINE HCL URETHRAL/MUCOSAL 2 % EX GEL: CUTANEOUS | Status: DC | PRN

## 2022-04-12 MED ORDER — SENNOSIDES-DOCUSATE SODIUM 8.6-50 MG PO TABS: 2.0000 | ORAL_TABLET | Freq: Every day | ORAL | Status: DC

## 2022-04-12 MED ORDER — MAGNESIUM HYDROXIDE 400 MG/5ML PO SUSP
30.0000 mL | Freq: Once | ORAL | Status: AC
  Administered 2022-04-12: 30 mL via ORAL
  Filled 2022-04-12: qty 30

## 2022-04-12 MED ORDER — SENNOSIDES-DOCUSATE SODIUM 8.6-50 MG PO TABS
2.0000 | ORAL_TABLET | Freq: Every day | ORAL | Status: DC
  Administered 2022-04-12 – 2022-04-14 (×3): 2
  Filled 2022-04-12 (×3): qty 2

## 2022-04-12 MED ORDER — OSMOLITE 1.5 CAL PO LIQD
400.0000 mL | Freq: Every day | ORAL | Status: AC
  Administered 2022-04-12: 400 mL
  Filled 2022-04-12: qty 474

## 2022-04-12 MED ORDER — OSMOLITE 1.5 CAL PO LIQD
1000.0000 mL | Freq: Every day | ORAL | Status: DC
  Filled 2022-04-12: qty 1000

## 2022-04-12 MED ORDER — FREE WATER
100.0000 mL | Freq: Three times a day (TID) | Status: DC
  Administered 2022-04-12 – 2022-04-14 (×8): 100 mL

## 2022-04-12 MED ORDER — CEPHALEXIN 250 MG PO CAPS
500.0000 mg | ORAL_CAPSULE | Freq: Two times a day (BID) | ORAL | Status: DC
  Administered 2022-04-12 – 2022-04-13 (×2): 500 mg
  Filled 2022-04-12 (×2): qty 2

## 2022-04-12 NOTE — Progress Notes (Addendum)
PROGRESS NOTE   Subjective/Complaints: Gross hematuria this morning, hold lovenox, hgb stable, sting of bottles ordered Daily weights ordered given prior congestion but this has improved She is feeling well this morning  ROS: husband notes chest congestion- improved, +fatigue, left arm pain resolved, +gross hematuria  Objective:   No results found.  Recent Labs    04/11/22 0713 04/12/22 0450  WBC 10.0 9.5  HGB 11.7* 11.8*  HCT 36.3 36.6  PLT 341 348   Recent Labs    04/11/22 0713 04/12/22 0458  NA 136 137  K 4.2 4.1  CL 104 101  CO2 25 26  GLUCOSE 136* 135*  BUN 16 18  CREATININE 0.76 0.72  CALCIUM 8.8* 9.0    Intake/Output Summary (Last 24 hours) at 04/12/2022 9628 Last data filed at 04/11/2022 1305 Gross per 24 hour  Intake --  Output 1 ml  Net -1 ml         Physical Exam: Vital Signs Blood pressure (!) 139/53, pulse 62, temperature 97.7 F (36.5 C), temperature source Oral, resp. rate 16, height '5\' 4"'$  (1.626 m), weight 88.4 kg, SpO2 96 %. Vitals and nursing note reviewed. BMI 33.45 Constitutional:      Comments: Up in chair with cortak in nares. Gurgling/sonorous upper airway sounds noted.   HEENT: NGT in place Neurological:     Mental Status: She is alert.     Comments: Right inattention with decrease in EOMI to the right. Right facial weakness with wet/congested/sonorous upper airway sounds.  Tends to keep mouth open with reports of sore throat. Receptive> expressive deficits with delay in processing. She was able to initiate output with max cues but noted to have perseverative echolaly. Occasional social utterance. Unable to point or state her or recognize her son's name. Able to follow simple motor commands with visual and tactile cues.     General: No acute distress Mood and affect are appropriate Heart: Regular rate and rhythm no rubs murmurs or extra sounds Lungs: upper airway sounds o/w  Clear to auscultation, breathing unlabored, no rales or wheezes, appears to have chest congestion Abdomen: Positive bowel sounds, soft nontender to palpation, nondistended Extremities: No clubbing, cyanosis, or edema Skin: No evidence of breakdown, no evidence of rash Neurologic: More alert and talkative. Cranial nerves II through XII intact, motor strength is 5/5 in left deltoid, bicep, tricep, grip, hip flexor, knee extensors, ankle dorsiflexor and plantar flexor Trace finger flexin RUE o/w 0/5 3- Right HF, KE, 2- ankle PT/DF Sensory exam reduced light touch LEFT upper and lower extremities   Musculoskeletal: no pain with  range of motion in all 4 extremities. No joint swelling      Assessment/Plan: 1. Functional deficits which require 3+ hours per day of interdisciplinary therapy in a comprehensive inpatient rehab setting. Physiatrist is providing close team supervision and 24 hour management of active medical problems listed below. Physiatrist and rehab team continue to assess barriers to discharge/monitor patient progress toward functional and medical goals  Care Tool:  Bathing    Body parts bathed by patient: Right arm, Chest, Abdomen, Face, Right upper leg, Left upper leg, Front perineal area   Body parts bathed by  helper: Right lower leg, Left lower leg, Buttocks, Left arm     Bathing assist Assist Level: Maximal Assistance - Patient 24 - 49%     Upper Body Dressing/Undressing Upper body dressing   What is the patient wearing?: Button up shirt    Upper body assist Assist Level: Maximal Assistance - Patient 25 - 49%    Lower Body Dressing/Undressing Lower body dressing      What is the patient wearing?: Pants, Incontinence brief     Lower body assist Assist for lower body dressing: Maximal Assistance - Patient 25 - 49%     Toileting Toileting Toileting Activity did not occur (Clothing management and hygiene only): N/A (no void or bm)  Toileting assist Assist  for toileting: Moderate Assistance - Patient 50 - 74%     Transfers Chair/bed transfer  Transfers assist  Chair/bed transfer activity did not occur: Safety/medical concerns  Chair/bed transfer assist level: Moderate Assistance - Patient 50 - 74%     Locomotion Ambulation   Ambulation assist      Assist level: Moderate Assistance - Patient 50 - 74% Assistive device: Hand held assist Max distance: 20   Walk 10 feet activity   Assist  Walk 10 feet activity did not occur: Safety/medical concerns  Assist level: Moderate Assistance - Patient - 50 - 74% Assistive device: Hand held assist   Walk 50 feet activity   Assist Walk 50 feet with 2 turns activity did not occur: Safety/medical concerns         Walk 150 feet activity   Assist Walk 150 feet activity did not occur: Safety/medical concerns         Walk 10 feet on uneven surface  activity   Assist Walk 10 feet on uneven surfaces activity did not occur: Safety/medical concerns         Wheelchair     Assist Is the patient using a wheelchair?: Yes Type of Wheelchair: Manual    Wheelchair assist level: Dependent - Patient 0% Max wheelchair distance: 58f    Wheelchair 50 feet with 2 turns activity    Assist        Assist Level: Dependent - Patient 0%   Wheelchair 150 feet activity     Assist      Assist Level: Dependent - Patient 0%   Blood pressure (!) 139/53, pulse 62, temperature 97.7 F (36.5 C), temperature source Oral, resp. rate 16, height '5\' 4"'$  (1.626 m), weight 88.4 kg, SpO2 96 %.    Medical Problem List and Plan: 1. Functional deficits secondary to Left MCA infarct             -patient may  shower             -ELOS/Goals: 14-16d MinA  Start vitamin B/C supplement  Continue CIR 2.  Antithrombotics: -DVT/anticoagulation:  Mechanical: Sequential compression devices, below knee Bilateral lower extremities             -antiplatelet therapy: DAPT X 3 weeks followed  by ASA alone.  3. Pain Management:  Tylenol prn.  4. Mood/Behavior/Sleep: LCSW to follow for evaluation and support.              -antipsychotic agents: N/A 5. Neuropsych/cognition: This patient is not fully capable of making decisions on her own behalf. 6. Skin/Wound Care: Routine pressure relief measures.  7. Fluids/Electrolytes/Nutrition: Monitor I/O. Check CMET in am. 8.  HTN: Monitor BP TID--continue coreg with BP goal <160. 9. Dysphagia: Continue NPO with  tube feeds for nutritional support.  10. Hyperglycemia: Hgb A1C- 11 L-BBB s/p PPM: Brief episode of A flutter. Continue to monitor. 12. Hyperlipidemia: Continue Lipitor 13.  ABLA: Hgb down from 15.1-->12.9-->12.4-->11.7 will monitor for signs of bleeding.  Heme occult ordered 14. UTI: Keflex started. UC with 20,000 E coli, f/u sensitivities 15. Chest congestion: chest PT ordered. Decrease TF rate to 50/hour to avoid fluid overload 16. Obesity: BMI 32-->33.45: provide dietary education, decrease TF rate to avoid fluid overload, appears less congested with decrease, discussed with patient and husband, daily weights ordered.  17. Hemorrhoid: witch hazel pads ordered.  18. Gross hematuria: lovenox held 19. Constipation: milk of mag ordered today   LOS: 6 days A FACE TO FACE EVALUATION WAS PERFORMED  Keeva Reisen P Sydna Brodowski 04/12/2022, 9:22 AM

## 2022-04-12 NOTE — Progress Notes (Signed)
This provider was in contact with Silvestre Mesi PA, he will assess Ms. Schlotzhauer .

## 2022-04-12 NOTE — Progress Notes (Signed)
Occupational Therapy Session Note  Patient Details  Name: Elaine Ford MRN: [id number] Date of Birth: 12-Dec-1945  Today's Date: 04/12/2022 OT Individual Time: 4098-1191;  1300-1410 OT Individual Time Calculation (min): 28 min;  70 min    Short Term Goals: Week 1:  OT Short Term Goal 1 (Week 1): Pt will increase UB dressing and bathing to Max A while demonstrating use of hemi dressing techniques and compensatory strategies. OT Short Term Goal 2 (Week 1): Pt will increase LB bathing and dressing while utilizing AE as needed to increase independence. OT Short Term Goal 3 (Week 1): Pt will utilize her RUE as a stabilizer when completing self feed and grooming tasks. OT Short Term Goal 4 (Week 1): Pt will increase toilet transfer to [place]  Skilled Therapeutic Interventions/Progress Updates:   AM Session:  Patient received supine in bed.  Husband at bedside.  Patient declined need to void - stating she had gone x 2 already this am.  Husband reports patient indicates a need to void, but cannot wait for staff before wetting her brief.  Discussed timed toileting.   While supine - worked on attention to [employer] and facilitating simple reach patterns.  Patient showing early shoulder  and elbow flexion patterns.  Needs max encouragement to produce movement.  Patient fatigues quickly.  Patient assisted to sitting to address postural control and static to slightly dynamic sitting balance.  Assisted patient to don pants for PT session immediately following.  Patient left up in wheelchair for direct handoff to PT.     PM Session:  Patient received supine in bed.  Agreeable to OT session, although tired from PT session.  Patient agrees that she could void, and her brief was dry.  Transferred to toilet with mod assist, and assist for clothing management.  Continent void.  Alerted nursing for urine specimen.   Transported to gym to address Neuromuscular reeducation for RUE with emphasis on visual attention  to Right, forced use concepts, pre-functional use - grasp, reach, pinch, and work to increase motor persistence in RUE.  Husband present for this session.  Patient returned to room, and put back to bed per her request.  Patient fatigued.  Bed alarm set, and call bell within reach.  Husband and son at bedside.    Therapy Documentation Precautions:  Precautions Precautions: Fall Precaution Comments: R hemi, coretrack, apraxic Restrictions Weight Bearing Restrictions: No  Pain:  No pain   Therapy/Group: Individual Therapy  Mariah Milling 04/12/2022, 2:18 PM

## 2022-04-12 NOTE — Progress Notes (Signed)
Received a call from [name] reporting Elaine Ford has hematuria ( she states gross hematuria) . Dr Octavio Manns note was reviewed. Stat CBC ordered, she is currently on DAPT. Dr Octavio Manns called, awaiting a return call.

## 2022-04-12 NOTE — Progress Notes (Signed)
Osmolite 1.5 turned off per order. UA collected and sent to lab. No BM at this time. Free water flush given as ordered. Repositioned Q 2 hours and PRN. Family at bedside.

## 2022-04-12 NOTE — Progress Notes (Signed)
Modified Barium Swallow Progress Note  Patient Details  Name: Elaine Ford MRN: [id number] Date of Birth: 10-21-1945  Today's Date: 04/12/2022  Modified Barium Swallow completed.  Full report located under Chart Review in the Imaging Section.  Brief recommendations include the following:  Clinical Impression MBSS completed to assess biomechanics of swallow function and determine readiness to initiate PO diet; currently pt is NPO with Cortrak in place. Hospital-based Spanish interpreter present throughout.   Per instrumental findings, pt presents with mild-moderate oropharyngeal dysphagia that appears to be negatively impacted by pt's cognitive status. Oral phase most notable for decreased lingual strength resulting in lingual pumping, poor oral containment resulting in premature spillage into the pharynx prior to swallow initiation, prolonged + impaired mastication, and trace oral residuals post-swallow. Pharyngeal phase most notable for decreased laryngeal closure, variable swallow initiation points, and mistiming/incoordination of swallow apneic period and swallow initiation.    Aforementioned deficits resulted in silent cord-level penetration with resultant aspiration (PAS 8) with thin liquid via tsp. Trace-transient, shallow penetration (PAS 2) appreciated with intake of nectar-thick liquid via cup likely due to fatigue, and above cord-level + silent penetration (PAS 3) when nectar-thick liquid was provided via tsp and straw. No penetration nor aspiration appreciated with puree and Dysphagia 2 trials. SLP attempted to prompt volitional throat clear and cough with pt unable to cognitively complete task. Intermittently performed volitional secondary swallow to clear trace oropharyngeal residuals. Due to cognitive deficits, pt unable to follow through and consistently perform compensatory swallowing strategies at this time.   Given instrumental findings and clinical presentation, recommend  initiation of Dysphagia 1 textures with nectar-thick liquids via cup only (no straws) with adherence to strict aspiration precautions, in light of cognitive deficits, to include full supervision with all PO intake, small + single bites/sips, slow rate, upright positioning, and fully awake/alert, and no straws. SLP will continue to provide close skilled observation and monitoring of tolerance for current diet and ability to upgrade throughout this admission. Solids may be upgraded clinically; however, to upgrade liquid consistencies, recommend repeat instrumental assessment when appropriate given hx of silent penetration and aspiration.  Swallow Evaluation Recommendations   SLP Diet Recommendations: Dysphagia 1 (Puree) solids;Nectar thick liquid   Liquid Administration via: No straw;Cup   Medication Administration: Crushed with puree   Supervision: Full supervision/cueing for compensatory strategies;Staff to assist with self feeding   Compensations: Minimize environmental distractions;Slow rate;Small sips/bites   Postural Changes: Remain semi-upright after after feeds/meals (Comment);Seated upright at 90 degrees   Oral Care Recommendations: Oral care BID        Octave Montrose A Brittni Hult 04/12/2022,1:11 PM

## 2022-04-12 NOTE — Progress Notes (Signed)
One Occult blood card sent to lab

## 2022-04-12 NOTE — Progress Notes (Signed)
Patient has had bright bloody urine. On call Zella Ball made away and repeat CBC, BMP and UA ordered per Zella Ball. Patient continent through out the night. Patient in bed with call bell at reach and husband at bedside.

## 2022-04-12 NOTE — Progress Notes (Signed)
Physical Therapy Session Note  Patient Details  Name: Elaine Ford MRN: [id number] Date of Birth: [birth year]  Today's Date: 04/12/2022 PT Individual Time: 3419-6222 PT Individual Time Calculation (min): 71 min   Short Term Goals: Week 1:  PT Short Term Goal 1 (Week 1): Pt will complete bed mobility with modA PT Short Term Goal 2 (Week 1): Pt will complete bed<>chair transfers with modA and LRAD PT Short Term Goal 3 (Week 1): Pt will ambulate 40f with modA and LRAD PT Short Term Goal 4 (Week 1): Pt will initiate stair training PT Short Term Goal 5 (Week 1): Pt will participate in functional outcome measure to assess falls risk  Skilled Therapeutic Interventions/Progress Updates:      Direct handoff of care from OT to start - pt sitting in w/c and agreeable to PT tx. Husband at bedside and present throughout session to assist with interpretation as needed. Donned socks/tennis shoes with dependant assist. Pt with improved attention and initiation on her R side compared to prior session.  Transported in w/c to day room rehab gym. Stand step transfer with min/modA to Nustep, towards her stronger L side. SetupA required on Nustep. Pt completed 10 minutes total for neural primining, R attention, cardiovascular endurance, RLE strengthening. Completed 196 steps total and required brief intermittent rest breaks. Assist for keeping cadence >40 steps minute but patient unable to sustain, keeps cadence ~10 steps/minute when not cued.   Transported to main rehab gym and focused remainder of session on gait training, standing, and RLE NMR.  Gait training using EEthelene Halwith +2 assist from rehab tech. Shoe cover applied to R foot. Ambulated 383f+ 2028fith seated rest breaks and min/modA for trunk support on R, and facilitating ~75% of RLE swing phase. Pt lacks adequate hip/knee flexion in swing phase and "drags" her foot with poor awareness of R foot placement.   NMR with mirror for visual feedback  - standing facing // bars - rises to stand with minA and R knee softly blocked. Blocked practice with emphasis on hand placement for pushing from arm rests rather than pulling from // bar. In standing, worked on lateral weight shifting L<>R with minA for facilitation. Also worked on small step ups to 2inch block on L to promote loading on R.   Pt requesting to lie down in bed at end of session due to fatigue. Squat<>pivot transfer with minA using hospital bed rail. MaxA needed for sit>supine. Shoes removed, bed alarm on, all needs met.   Therapy Documentation Precautions:  Precautions Precautions: Fall Precaution Comments: R hemi, coretrack, apraxic Restrictions Weight Bearing Restrictions: No General:     Therapy/Group: Individual Therapy  ChrAlger Simons/09/2021, 7:56 AM

## 2022-04-13 LAB — GLUCOSE, CAPILLARY
Glucose-Capillary: 105 mg/dL — ABNORMAL HIGH (ref 70–99)
Glucose-Capillary: 121 mg/dL — ABNORMAL HIGH (ref 70–99)
Glucose-Capillary: 121 mg/dL — ABNORMAL HIGH (ref 70–99)
Glucose-Capillary: 124 mg/dL — ABNORMAL HIGH (ref 70–99)
Glucose-Capillary: 139 mg/dL — ABNORMAL HIGH (ref 70–99)
Glucose-Capillary: 91 mg/dL (ref 70–99)
Glucose-Capillary: 97 mg/dL (ref 70–99)

## 2022-04-13 LAB — CBC
HCT: 36.8 % (ref 36.0–46.0)
Hemoglobin: 11.8 g/dL — ABNORMAL LOW (ref 12.0–15.0)
MCH: 27.4 pg (ref 26.0–34.0)
MCHC: 32.1 g/dL (ref 30.0–36.0)
MCV: 85.4 fL (ref 80.0–100.0)
Platelets: 363 10*3/uL (ref 150–400)
RBC: 4.31 MIL/uL (ref 3.87–5.11)
RDW: 14.5 % (ref 11.5–15.5)
WBC: 9.6 10*3/uL (ref 4.0–10.5)
nRBC: 0 % (ref 0.0–0.2)

## 2022-04-13 LAB — OCCULT BLOOD X 1 CARD TO LAB, STOOL
Fecal Occult Bld: NEGATIVE
Fecal Occult Bld: NEGATIVE

## 2022-04-13 MED ORDER — INSULIN ASPART 100 UNIT/ML IJ SOLN: 0.0000 [IU] | Freq: Every day | INTRAMUSCULAR | Status: DC

## 2022-04-13 MED ORDER — JEVITY 1.5 CAL/FIBER PO LIQD
600.0000 mL | ORAL | Status: DC
  Administered 2022-04-13: 600 mL
  Filled 2022-04-13 (×2): qty 711

## 2022-04-13 MED ORDER — PROSOURCE TF20 ENFIT COMPATIBL EN LIQD
60.0000 mL | Freq: Two times a day (BID) | ENTERAL | Status: DC
  Filled 2022-04-13: qty 60

## 2022-04-13 MED ORDER — INSULIN ASPART 100 UNIT/ML IJ SOLN
0.0000 [IU] | Freq: Three times a day (TID) | INTRAMUSCULAR | Status: DC
  Administered 2022-04-13 – 2022-04-19 (×12): 1 [IU] via SUBCUTANEOUS

## 2022-04-13 NOTE — Progress Notes (Signed)
Patient ID: Elaine Ford, female   DOB: 06/28/1946, 76 y.o.   MRN: [id number]  Met with pt, husband and son in the room to update all on team conference goals of min assist and target discharge date of 10/18. Pt is wanting to go home but will stay and work on therapy goals. She is hopeful the cortrak can come out once she is able to take in enough po. All pleased with the plan and will work toward discharge 10/18. Husband aware therapy team will have do hands on when feels appropriate. He does go to her therapy sessions with her now

## 2022-04-13 NOTE — Progress Notes (Signed)
PROGRESS NOTE   Subjective/Complaints:  No gross hematuria this AM; per NP, examined string of bottles yesterday and only noted microhematuria. Renal US WNL.  2/3 FOBT negative. Patient complains of lower abdominal pain this AM; husband states this has been occurring with solid foods. Patient had BM shortly thereafter and nursing reports resolution of abdominal pain.  Much improved incontinent episodes with timed toiletting; 1x incontinence this AM per family, and only when patient had refused prior toiletting.   ROS: Limited d/t language and cognition. Husband endorses +abdominal pain.   Objective:   US RENAL  Result Date: 04/12/2022 CLINICAL DATA:  Hematuria. EXAM: RENAL / URINARY TRACT ULTRASOUND COMPLETE COMPARISON:  None Available. FINDINGS: Right Kidney: Renal measurements: 9.9 x 5.1 x 4.3 cm = volume: 112 mL. Echogenicity within normal limits. No mass or hydronephrosis visualized. Left Kidney: Renal measurements: 11.4 x 5.4 x 4.1 cm = volume: 131 mL. 1.7 cm cyst is noted in lower pole. Echogenicity within normal limits. No mass or hydronephrosis visualized. Bladder: Appears normal for degree of bladder distention. Other: None. IMPRESSION: No significant renal abnormality is noted. Electronically Signed   By: Marijo Conception M.D.   On: 04/12/2022 15:32   DG Swallowing Func-Speech Pathology  Result Date: 04/12/2022 Table formatting from the original result was not included. Objective Swallowing Evaluation: Type of Study: MBS-Modified Barium Swallow Study  Patient Details Name: Elaine Ford MRN: [id number] Date of Birth: [birth year] Today's Date: 04/12/2022 Time: SLP Start Time (ACUTE ONLY): 63 -SLP Stop Time (ACUTE ONLY): 53 SLP Time Calculation (min) (ACUTE ONLY): 27 min Past Medical History: Past Medical History: Diagnosis Date  Back pain   Broken arm   age 64 years old broke right arm after a fall  Gall bladder stones   Gestational  diabetes   Hemorrhoid   Hx of cardiac cath   [facility] 07/24/14 at [facility] - no significant CAD  Hx of cardiovascular stress test   Myoview 11/14:  No ischemia  Hx of echocardiogram   Echo 07/23/14 - at [facility]:  EF 65-70%, impaired relaxation, mild LAE, mild MR, mild AI, mild elevated PASP (44.2 mmHg)  Hyperlipidemia   Hypertension   LBBB (left bundle branch block)   Low blood pressure   Nerve damage   right arm  Pain   Left upper chest s/p pacer maker insertion site 07/2014  S/P cardiac pacemaker procedure 07/2014  [employer] - Dr. Lovena Le  Stroke [facility])   patient was unaware noted prior to pacermaker placement unknown when it occurred Past Surgical History: Past Surgical History: Procedure Laterality Date  COLONOSCOPY    DILATATION & CURETTAGE/HYSTEROSCOPY WITH MYOSURE N/A 02/29/2016  Procedure: DILATATION & CURETTAGE/HYSTEROSCOPY WITH MYOSURE;  Surgeon: [name], DO;  Location: [facility];  Service: Gynecology;  Laterality: N/A;  patient has a pacemaker   GALLBLADDER SURGERY    IR 3D INDEPENDENT WKST  03/28/2022  IR PERCUTANEOUS ART THROMBECTOMY/INFUSION INTRACRANIAL INC DIAG ANGIO  03/28/2022  IR US GUIDE VASC ACCESS RIGHT  03/28/2022  LAPAROSCOPIC BILATERAL SALPINGO OOPHERECTOMY  04/20/2016  Procedure: LAPAROSCOPIC BILATERAL SALPINGO OOPHORECTOMY;  Surgeon: [name], DO;  Location: [facility];  Service: Gynecology;;  LEAD REVISION N/A 07/25/2014  Procedure: LEAD REVISION;  Surgeon: Deboraha Sprang, MD;  Location: [facility] CATH LAB;  Service: Cardiovascular;  Laterality: N/A;  PERMANENT PACEMAKER INSERTION N/A 07/24/2014  Procedure: PERMANENT PACEMAKER INSERTION;  Surgeon: [name], MD;  Location: [facility] CATH LAB;  Service: Cardiovascular;  Laterality: N/A;  RADIOLOGY WITH ANESTHESIA N/A 03/28/2022  Procedure: IR WITH ANESTHESIA;  Surgeon: Radiologist, Medication, MD;  Location: [place];  Service: Radiology;  Laterality: N/A;  VAGINAL HYSTERECTOMY  04/20/2016  Procedure: HYSTERECTOMY VAGINAL;  Surgeon: [name], DO;  Location: [facility];   Service: Gynecology;; HPI: 9/18 admit for CVA.  CT of the head demonstrated hypodensity in the left MCA without hemorrhage.   Systemic TNK mechanical thrombectomy.  Post procedurally on vent to ICU. Postoperative CT did demonstrate small subarachnoid hemorrhage.Extubated 9/19. No MRI at this time.  No data recorded  Recommendations for follow up therapy are one component of a multi-disciplinary discharge planning process, led by the attending physician.  Recommendations may be updated based on patient status, additional functional criteria and insurance authorization. Assessment / Plan / Recommendation   04/12/2022   1:10 PM Clinical Impressions Clinical Impression MBSS completed to assess biomechanics of swallow function and determine readiness to initiate PO diet; currently pt is NPO with Cortrak in place. Hospital-based Spanish interpreter present throughout. Per instrumental findings, pt presents with mild-moderate oropharyngeal dysphagia that appears to be negatively impacted by pt's cognitive status. Oral phase most notable for decreased lingual strength resulting in lingual pumping, poor oral containment resulting in premature spillage into the pharynx prior to swallow initiation, prolonged + impaired mastication, and trace oral residuals post-swallow. Pharyngeal phase most notable for decreased laryngeal closure, variable swallow initiation points, and mistiming/incoordination of swallow apneic period and swallow initiation.  Aforementioned deficits resulted in silent cord-level penetration with resultant aspiration (PAS 8) with thin liquid via tsp. Trace-transient, shallow penetration (PAS 2) appreciated with intake of nectar-thick liquid via cup likely due to fatigue, and above cord-level + silent penetration (PAS 3) when nectar-thick liquid was provided via tsp and straw. No penetration nor aspiration appreciated with puree and Dysphagia 2 trials. SLP attempted to prompt volitional throat clear and cough  with pt unable to cognitively complete task. Intermittently performed volitional secondary swallow to clear trace oropharyngeal residuals. Due to cognitive deficits, pt unable to follow through and consistently perform compensatory swallowing strategies at this time. Given instrumental findings and clinical presentation, recommend initiation of Dysphagia 1 textures with nectar-thick liquids via cup only (no straws) with adherence to strict aspiration precautions, in light of cognitive deficits, to include full supervision with all PO intake, small + single bites/sips, slow rate, upright positioning, and fully awake/alert, and no straws. SLP will continue to provide close skilled observation and monitoring of tolerance for current diet and ability to upgrade throughout this admission. Solids may be upgraded clinically; however, to upgrade liquid consistencies, recommend repeat instrumental assessment when appropriate given hx of silent penetration and aspiration. SLP Visit Diagnosis Dysphagia, oropharyngeal phase (R13.12) Impact on safety and function Moderate aspiration risk     03/30/2022   2:00 PM Treatment Recommendations Treatment Recommendations Therapy as outlined in treatment plan below     04/12/2022   1:10 PM Prognosis Prognosis for Safe Diet Advancement Good Barriers to Reach Goals Cognitive deficits;Language deficits;Severity of deficits   04/12/2022   1:10 PM Diet Recommendations SLP Diet Recommendations Dysphagia 1 (Puree) solids;Nectar thick liquid Liquid Administration via No straw;Cup Medication Administration Crushed with puree Compensations Minimize environmental distractions;Slow rate;Small sips/bites Postural Changes  Remain semi-upright after after feeds/meals (Comment);Seated upright at 90 degrees     04/12/2022   1:10 PM Other Recommendations Oral Care Recommendations Oral care BID   03/30/2022   2:00 PM Frequency and Duration  Speech Therapy Frequency (ACUTE ONLY) min 2x/week Treatment Duration 2  weeks     04/12/2022   1:05 PM Oral Phase Oral Phase Impaired Oral - Honey Teaspoon NT Oral - Nectar Teaspoon Weak lingual manipulation;Lingual pumping;Delayed oral transit;Decreased bolus cohesion;Premature spillage Oral - Nectar Cup Weak lingual manipulation;Lingual pumping;Delayed oral transit;Decreased bolus cohesion;Premature spillage;Reduced posterior propulsion Oral - Nectar Straw Delayed oral transit;Decreased bolus cohesion;Premature spillage;Lingual pumping;Weak lingual manipulation;Reduced posterior propulsion Oral - Thin Teaspoon Weak lingual manipulation;Lingual pumping;Decreased bolus cohesion;Premature spillage Oral - Thin Cup NT Oral - Thin Straw NT Oral - Puree Reduced posterior propulsion;Delayed oral transit;Decreased bolus cohesion;Premature spillage;Impaired mastication;Weak lingual manipulation;Lingual pumping Oral - Mech Soft Impaired mastication;Weak lingual manipulation;Reduced posterior propulsion;Lingual pumping;Piecemeal swallowing;Delayed oral transit;Decreased bolus cohesion;Premature spillage Oral - Regular NT Oral - Multi-Consistency NT Oral - Pill NT    04/12/2022   1:07 PM Pharyngeal Phase Pharyngeal Phase Impaired Pharyngeal- Honey Teaspoon NT Pharyngeal- Nectar Teaspoon Delayed swallow initiation-vallecula;Reduced airway/laryngeal closure Pharyngeal Material enters airway, remains ABOVE vocal cords and not ejected out Pharyngeal- Nectar Cup Delayed swallow initiation-vallecula;Delayed swallow initiation-pyriform sinuses;Reduced airway/laryngeal closure Pharyngeal Material enters airway, remains ABOVE vocal cords then ejected out Pharyngeal- Nectar Straw Delayed swallow initiation-vallecula;Delayed swallow initiation-pyriform sinuses;Reduced airway/laryngeal closure Pharyngeal Material enters airway, remains ABOVE vocal cords and not ejected out Pharyngeal- Puree [facility] Pharyngeal Material does not enter airway Pharyngeal- Mechanical Soft WFL Pharyngeal Material does not enter airway  Pharyngeal- Regular NT Pharyngeal- Multi-consistency NT Pharyngeal- Pill NT    04/12/2022   1:09 PM Cervical Esophageal Phase  Cervical Esophageal Phase [facility] L[facility] 04/12/2022, 1:12 PM                      Recent Labs    04/12/22 0450 04/13/22 0641  WBC 9.5 9.6  HGB 11.8* 11.8*  HCT 36.6 36.8  PLT 348 363    Recent Labs    04/11/22 0713 04/12/22 0458  NA 136 137  K 4.2 4.1  CL 104 101  CO2 25 26  GLUCOSE 136* 135*  BUN 16 18  CREATININE 0.76 0.72  CALCIUM 8.8* 9.0     Intake/Output Summary (Last 24 hours) at 04/13/2022 1033 Last data filed at 04/13/2022 1019 Gross per 24 hour  Intake 480 ml  Output 1450 ml  Net -970 ml          Physical Exam: Vital Signs Blood pressure (!) 163/62, pulse 73, temperature 98.3 F (36.8 C), temperature source Oral, resp. rate 16, height '5\' 4"'$  (1.626 m), weight 85.1 kg, SpO2 97 %. Vitals and nursing note reviewed. BMI 33.45 Constitutional:      Comments: + cortak in nares. Gurgling/sonorous upper airway sounds noted, mild.   Neurological:     Mental Status: She is alert.     Comments: Right inattention with decrease in EOMI to the right. Right facial weakness. Receptive> expressive deficits with delay in processing. + perseverative echolaly. Occasional social utterance. Able to follow simple motor commands with visual and tactile cues.     General: No acute distress Mood and affect are appropriate Heart: Regular rate and rhythm no rubs murmurs or extra sounds Lungs: upper airway sounds o/w Clear to auscultation, breathing unlabored, no rales or wheezes. Central sonorous sounds.  Abdomen: Positive bowel sounds, +TTP lower  abdomen, nondistended Extremities: No clubbing, cyanosis, or edema. Moving LUE antigravity; RUE no appreciable movement in bed.  Skin: No evidence of breakdown, no evidence of rash  PE from prior encounter:  Neurologic:  Trace finger flexin RUE o/w 0/5 3- Right HF, KE, 2- ankle PT/DF Sensory exam reduced  light touch LEFT upper and lower extremities   Musculoskeletal: no pain with  range of motion in all 4 extremities. No joint swelling      Assessment/Plan: 1. Functional deficits which require 3+ hours per day of interdisciplinary therapy in a comprehensive inpatient rehab setting. Physiatrist is providing close team supervision and 24 hour management of active medical problems listed below. Physiatrist and rehab team continue to assess barriers to discharge/monitor patient progress toward functional and medical goals  Care Tool:  Bathing    Body parts bathed by patient: Right arm, Chest, Abdomen, Face, Right upper leg, Left upper leg, Front perineal area   Body parts bathed by helper: Right lower leg, Left lower leg, Buttocks, Left arm     Bathing assist Assist Level: Maximal Assistance - Patient 24 - 49%     Upper Body Dressing/Undressing Upper body dressing   What is the patient wearing?: Button up shirt    Upper body assist Assist Level: Maximal Assistance - Patient 25 - 49%    Lower Body Dressing/Undressing Lower body dressing      What is the patient wearing?: Pants, Incontinence brief     Lower body assist Assist for lower body dressing: Maximal Assistance - Patient 25 - 49%     Toileting Toileting Toileting Activity did not occur (Clothing management and hygiene only): N/A (no void or bm)  Toileting assist Assist for toileting: Moderate Assistance - Patient 50 - 74%     Transfers Chair/bed transfer  Transfers assist  Chair/bed transfer activity did not occur: Safety/medical concerns  Chair/bed transfer assist level: Moderate Assistance - Patient 50 - 74%     Locomotion Ambulation   Ambulation assist      Assist level: Moderate Assistance - Patient 50 - 74% Assistive device: Hand held assist Max distance: 20   Walk 10 feet activity   Assist  Walk 10 feet activity did not occur: Safety/medical concerns  Assist level: Moderate Assistance -  Patient - 50 - 74% Assistive device: Hand held assist   Walk 50 feet activity   Assist Walk 50 feet with 2 turns activity did not occur: Safety/medical concerns         Walk 150 feet activity   Assist Walk 150 feet activity did not occur: Safety/medical concerns         Walk 10 feet on uneven surface  activity   Assist Walk 10 feet on uneven surfaces activity did not occur: Safety/medical concerns         Wheelchair     Assist Is the patient using a wheelchair?: Yes Type of Wheelchair: Manual    Wheelchair assist level: Dependent - Patient 0% Max wheelchair distance: 37f    Wheelchair 50 feet with 2 turns activity    Assist        Assist Level: Dependent - Patient 0%   Wheelchair 150 feet activity     Assist      Assist Level: Dependent - Patient 0%   Blood pressure (!) 163/62, pulse 73, temperature 98.3 F (36.8 C), temperature source Oral, resp. rate 16, height '5\' 4"'$  (1.626 m), weight 85.1 kg, SpO2 97 %.    Medical Problem List  and Plan: 1. Functional deficits secondary to Left MCA infarct             -patient may  shower             -ELOS/Goals: 14-16d MinA  Start vitamin B/C supplement  Continue CIR 2.  Antithrombotics: -DVT/anticoagulation:  Mechanical: Sequential compression devices, below knee Bilateral lower extremities             -antiplatelet therapy: DAPT X 3 weeks followed by ASA alone.  3. Pain Management:  Tylenol prn.  4. Mood/Behavior/Sleep: LCSW to follow for evaluation and support.              -antipsychotic agents: N/A 5. Neuropsych/cognition: This patient is not fully capable of making decisions on her own behalf. 6. Skin/Wound Care: Routine pressure relief measures.  7. Fluids/Electrolytes/Nutrition: Monitor I/O. Check CMET in am. 8.  HTN: Monitor BP TID--continue coreg with BP goal <160. 9. Dysphagia: Continue NPO with tube feeds for nutritional support.  - Started PO diet 10/3 Diet Orders (From admission,  onward)     Start     Ordered   04/12/22 1548  DIET - DYS 1 Room service appropriate? Yes; Fluid consistency: Nectar Thick  Diet effective now       Comments: Nectar-Thick liquid via cup only (NO STRAWS) Full supervision at meals. NEEDS TO BE SEMI UPRIGHT FOR 60 MINUTES AFTER MEALS.  Question Answer Comment  Room service appropriate? Yes   Fluid consistency: Nectar Thick      04/12/22 1547            10. Hyperglycemia: Hgb A1C- Recent Labs    04/13/22 0431 04/13/22 0624 04/13/22 0814  GLUCAP 121* 139* 105*    - well controlled - change SSI to [facility] and HS with initiation of oral diet  11 L-BBB s/p PPM: Brief episode of A flutter. Continue to monitor. 12. Hyperlipidemia: Continue Lipitor 13.  ABLA: Hgb down from 15.1-->12.9-->12.4-->11.7 will monitor for signs of bleeding.  Heme occult ordered 14. UTI: Keflex started. UC with 20,000 E coli, f/u sensitivities            - 10/4 sensitive, however with only 20k, resolving hematuria, and new abdominal pain/nausea, DC abx  15. Chest congestion: chest PT ordered. Decrease TF rate to 50/hour to avoid fluid overload 16. Obesity: BMI 32-->33.45: provide dietary education, decrease TF rate to avoid fluid overload, appears less congested with decrease, discussed with patient and husband, daily weights ordered.  17. Hemorrhoid: witch hazel pads ordered.  18. Gross hematuria: lovenox held - monitor 19. Constipation: milk of mag ordered today - BM 10/4   LOS: 7 days A FACE TO FACE EVALUATION WAS PERFORMED  Gertie Gowda 04/13/2022, 10:33 AM

## 2022-04-13 NOTE — Progress Notes (Signed)
Speech Language Pathology Daily Session Note  Patient Details  Name: Elaine Ford MRN: [id number] Date of Birth: [birth year]  Today's Date: 04/13/2022 SLP Individual Time: 1208-1253 SLP Individual Time Calculation (min): 45 min  Short Term Goals: Week 1: SLP Short Term Goal 1 (Week 1): Pt will demonstrate tolerance of D1 and nectar-thick liquid consistencies with minimal s/sx concerning for aspiration given Mod A cues for implementation of aspiration precautions. - With intake of D1 and nectar-thick liquid via cup, pt exhibited intermittent subtle throat clear during PO intake; however, vocal quality clear throughout with no change in RR. Per chart review, VSS. Benefited from Min-Mod A verbal and visual cues for implementation of aspiration precautions. Ate ~40% of meal and 120 mL of NTL.  SLP Short Term Goal 2 (Week 1): Pt will receptively ID common objects in field of 2 (vertical presentation) with 80% accuracy given Max A multimodal cues. - Did not formally address this session.  SLP Short Term Goal 3 (Week 1): Pt will respond to basic and personal yes/no questions verbally or via alternate means with 80% accuracy given Max A multimodal cues. - Pt responded to personal yes/no questions verbally with <50% accuracy given Max A multimodal cues.  SLP Short Term Goal 4 (Week 1): Pt will attend to functional and therapeutic tasks for 5 minutes given Mod A. - Pt attended to functional meal for 5+ minutes with Min A.  SLP Short Term Goal 5 (Week 1): Pt will utilize AAC (low-tech picture board, gestures) to communicate wants/needs given Max A multimodal cues. - Pt communicated verbally 100% of today's session given Min A verbal cues, though was not always producing coherent responses.  SLP Short Term Goal 6 (Week 1): Pt will demonstrate orientation x 4 via multimodal communication given Max A multinodal cues. - Demonstrated orientation x 4 verbally given Max to Total A multimodal cues. Benefited from  verbal choice prompts.  Skilled Therapeutic Interventions: S: Pt seen this date for skilled ST intervention targeting swallowing and cognitive goals outlined above. Pt received awake/alert and OOB in w/c. No c/o pain. Agreeable to ST intervention in hospital room. Pleasant and participatory throughout.  O: Please see above for objective data re: pt's performance on targeted goals. SLP provided skilled pt and family education re: ice chip protocol, importance of adhering to diet modifications and aspiration precautions, neuro recovery following CVA, exercises to practice yes/no questions, and use of visual supports to answer yes/no questions with Max A verbal cues. Pt spouse verbalized understanding.  A: Pt appears sitmulable for skilled ST intervention. Recommend continuation of current diet textures (Dysphagia 1 and nectar-thick liquid via cup) with full supervision.  P: Pt left in room and OOB in w/c with all safety measures activated. Call bell reviewed and within reach and all immediate needs met. Continue per current ST POC next session.   Pain Pain Assessment Pain Scale: 0-10 Pain Score: 0-No pain  Therapy/Group: Individual Therapy  Yaneliz Radebaugh A Jamerius Boeckman 04/13/2022, 1:34 PM

## 2022-04-13 NOTE — Patient Care Conference (Signed)
Inpatient RehabilitationTeam Conference and Plan of Care Update Date: 04/13/2022   Time: 11:06 AM    Patient Name: Elaine Ford      Medical Record Number: [id number]  Date of Birth: 1946-05-10 Sex: Female         Room/Bed: 4M10C/4M10C-01 Payor Info: Payor: [employer] MEDICARE / Plan: [place] HMO / Product Type: *No Product type* /    Admit Date/Time:  04/06/2022  3:10 PM  Primary Diagnosis:  Acute ischemic left middle cerebral artery (MCA) stroke [facility])  Hospital Problems: Principal Problem:   Acute ischemic left middle cerebral artery (MCA) stroke [facility])    Expected Discharge Date: Expected Discharge Date: 04/27/22  Team Members Present: Physician leading conference: Dr. Jennye Boroughs Social Worker Present: Ovidio Kin, LCSW Nurse Present: Dorien Chihuahua, RN PT Present: Ginnie Smart, PT OT Present: Other (comment) Antony Salmon, OT) SLP Present: Helaine Chess, SLP PPS Coordinator present : Ileana Ladd, PT     Current Status/Progress Goal Weekly Team Focus  Bowel/Bladder             Swallow/Nutrition/ Hydration   D1 + NTL with full supervision; Mod-Max A for implementation of aspiration precautions  Min A  Tolerance of current diet and implementation of aspiration precautions   ADL's   Max/mod assist BADL, poor trunk control, decreased awareness to Right body/env't, motor impersistence  Min assist ADL  Improve r attention, RUE NMR, balance sitting and standing, transitional movements, activity tolerance, family education   Mobility   maxA bed mobility, modA squat<>pivot transfers, minA for sit<>stand in // bars, ambulating ~70f with modA +2. R sided weakness (UE > LE), R inattention, impaired sustained/selective attention, decreased awareness, impaired endurance and activity tolerance  CGA/minA overall  Bed mobility, functional transfers, RLE NMR, gait training, standing balance. Initiate stair training   Communication   Mod-Max A  Min A for basic auditory  comprehension and express of wants and needs via multimodal communication. Mod A for verbally expression of wants and needs and naming.  naming, yes/no responses, command following, verbal communication, and use of AAC board   Safety/Cognition/ Behavioral Observations  Max A  Min A for orientation, sustained attention, and intellectual awareness  orientation, attention, and intellectual awareness   Pain     Left UE pain   Manage pain with prns   Assess need for and effectiveness of prn meds  Skin     N/a          Discharge Planning:  HOme with husband who is here aily and observing in therapies, will participate when appropriate   Team Discussion: Patient  post left MCA CVA with apraxia and right lower extremity weakness with activity intolerance. Presents with abd pain-constipation and UTI. DM well controlled per MD.  Patient on target to meet rehab goals: yes, currently needs mod - max assist for ADLs, max assist for bed mobility and mod assist for squat pivot transfers. Able to ambulate up to 3-0' with mod assist. Needs mod - max assist for aspiration precaution following with D1 nectar thick diet and mod - max assist for communication. Goals for discharge set for CGA - min assist overall  *See Care Plan and progress notes for long and short-term goals.   Revisions to Treatment Plan:  MBS 04/12/22 Neuro re-education   Teaching Needs: Safety, medications, dietary modifications, transfers, toileting, etc  Current Barriers to Discharge: Decreased caregiver support  Possible Resolutions to Barriers: [facility]     Medical Summary  Current Status: Left MCA infarct, HTN, UTI, dysphagia, constipation, ABLA  Barriers to Discharge: Home enviroment access/layout;Medical stability  Barriers to Discharge Comments: Left MCA infarct, HTN, UTI, dysphagia, constipation, ABLA Possible Resolutions to [employer] Focus: monitor BP, laxatives when needed, monitor  CBC   Continued Need for Acute Rehabilitation Level of Care: The patient requires daily medical management by a physician with specialized training in physical medicine and rehabilitation for the following reasons: Direction of a multidisciplinary physical rehabilitation program to maximize functional independence : Yes Medical management of patient stability for increased activity during participation in an intensive rehabilitation regime.: Yes Analysis of laboratory values and/or radiology reports with any subsequent need for medication adjustment and/or medical intervention. : Yes   I attest that I was present, lead the team conference, and concur with the assessment and plan of the team.   Dorien Chihuahua B 04/13/2022, 4:50 PM

## 2022-04-13 NOTE — Progress Notes (Signed)
Nutrition Follow-up  DOCUMENTATION CODES:   Not applicable  INTERVENTION:   Tube Feeds via Cortrak:  Jevity 1.5 @ 60 mL/hr x 10 hours (600 mL/night) ProSource TF20 - BID Provides 1060 kcal, 78 gm protein, and 456 mL free water daily.  Calories count per MD  NUTRITION DIAGNOSIS:   Inadequate oral intake related to inability to eat as evidenced by NPO status. - Progressing, pt now on a diet  GOAL:   Patient will meet greater than or equal to 90% of their needs - Ongoing  MONITOR:   PO intake, TF tolerance, I & O's, Labs  REASON FOR ASSESSMENT:   Consult Enteral/tube feeding initiation and management  ASSESSMENT:   76 year old female with PMH of HTN, gestational diabetes, HLD, LBBB s/p PPM. Pt admitted on 03/28/22 with L MCA stroke. Pt received TNK and underwent cerebral angio with mechanical thrombectomy. Pt has been NPO due to severe oropharyngeal dysphagia with sensed aspiration as well as swelling of epiglottis. Cortrak in place for nutrition support. Pt admitted to CIR on 04/06/22.  10/03 - diet advanced to Dysphagia 1, Nectar Thick Liquids    Pt in therapy at time of RD visit. RN reports that pt is eating ok, is drinking better. Reports that pt was having abdominal pain that resolved after having a BM, discussed changing to a formula with Jevity. RN in agreement to continue with night-time feeds. RD to follow-up on calorie count tomorrow.  Meal completions: 20-55% x 3 meals (average 33%)  Medications reviewed and include: Vitamin B w/ C complex, Kelfex, NovoLog SSI, Magnesium Gluconate, Protonix, Senokot-S Labs reviewed: 24 hr CBG 86-139  Diet Order:   Diet Order             DIET - DYS 1 Room service appropriate? Yes; Fluid consistency: Nectar Thick  Diet effective now                   EDUCATION NEEDS:   No education needs have been identified at this time  Skin:  Skin Assessment: Reviewed RN Assessment  Last BM:  10/4 - Type 6  Height:   Ht  Readings from Last 1 Encounters:  04/06/22 '5\' 4"'$  (1.626 m)    Weight:   Wt Readings from Last 1 Encounters:  04/13/22 85.1 kg   BMI:  Body mass index is 32.2 kg/m.  Estimated Nutritional Needs:  Kcal:  5397-6734 Protein:  85-100 grams Fluid:  1.7-1.9 L   Hermina Barters RD, LDN Clinical Dietitian See [facility] for contact information.

## 2022-04-13 NOTE — Progress Notes (Signed)
Occupational Therapy Session Note  Patient Details  Name: Elaine Ford MRN: [id number] Date of Birth: [birth year]  Today's Date: 04/13/2022 OT Individual Time: 1300-1415 OT Individual Time Calculation (min): 75 min    Short Term Goals: Week 1:  OT Short Term Goal 1 (Week 1): Pt will increase UB dressing and bathing to Max A while demonstrating use of hemi dressing techniques and compensatory strategies. OT Short Term Goal 2 (Week 1): Pt will increase LB bathing and dressing while utilizing AE as needed to increase independence. OT Short Term Goal 3 (Week 1): Pt will utilize her RUE as a stabilizer when completing self feed and grooming tasks. OT Short Term Goal 4 (Week 1): Pt will increase toilet transfer to [place] Therapeutic Interventions/Progress Updates:    Patient received sitting in wheelchair. Patient agreeable to shower with some coaxing.  Husband present this session.  Patient able to stand and step into shower with mod assist.  Patient attempting to use right hand at times during familiar functional tasks.  Patient starts movement in right but lacks motor persistence and hand functioning to complete.  Patient with improved sitting balance today.   Patient able to button all buttons on her Pajama top this session in 2 minutes.  Last attempt it took nearly two minutes to button one button.  Patient with improved attention and focus.   Patient transported to ADL apartment to address stand tolerance, stand alignment, stand balance while sorting silverwear at countertop.  Patient transported back to room for brief rest before PT session.    Therapy Documentation Precautions:  Precautions Precautions: Fall Precaution Comments: R hemi, coretrack, apraxic Restrictions Weight Bearing Restrictions: No  Pain: Pain Assessment Pain Scale: 0-10 Pain Score: 0-No pain    Therapy/Group: Individual Therapy  Mariah Milling 04/13/2022, 3:42 PM

## 2022-04-13 NOTE — Progress Notes (Signed)
Physical Therapy Session Note  Patient Details  Name: Elaine Ford MRN: [id number] Date of Birth: 22-Jan-1946  Today's Date: 04/13/2022 PT Individual Time: 1415-1528 PT Individual Time Calculation (min): 73 min   Short Term Goals: Week 1:  PT Short Term Goal 1 (Week 1): Pt will complete bed mobility with modA PT Short Term Goal 2 (Week 1): Pt will complete bed<>chair transfers with modA and LRAD PT Short Term Goal 3 (Week 1): Pt will ambulate 61f with modA and LRAD PT Short Term Goal 4 (Week 1): Pt will initiate stair training PT Short Term Goal 5 (Week 1): Pt will participate in functional outcome measure to assess falls risk  Skilled Therapeutic Interventions/Progress Updates:      1st attempt: Pt in bed on arrival with husband and son at bedside. Per husband, patient is reporting pain but pt unable to describe location/severity due to aphasia/cognition. Husband reports that the patient just finished eating ~50% of her breakfast and believes this is contributing to possible abdominal discomfort. Pt groaning with eyes closed, hands on stomach. RN made aware of patient's status. Pt unable to participate due to discomfort. Will return as schedule and pt availability permits. Missed 75 minutes of therapy.   2nd attempt: Pt sitting in w/c to start - husband and son at bedside. Family reports patient feels much better compared to AM. Pt agreeable to PT tx.   Transported in w/c to main rehab gym. Stand step transfer to mat table with minA and no AD.   Worked on sit<>stand transitions and dynamic standing balance with mirror for visual feedback. 1x10 sit<>stands using back of arm chair for support, CGA for safety and cues for reducing R lean. 1x10 sit<>stands using back of arm chair for support and green TB around her waist to provide posterior perturbation. Worked on reaching for horseshoes towards her R side with minA and R knee block - worked on trunk elongation and upward reaching for  placing horseshoes on top of mirror.   Attempted to work on sBaristawith UE support on arm chair but pt unable to motor plan standing marching. She also lacked adequate hip/knee flexion on R to clear RLE off floor. Pt reporting fatigue and requesting to return to bed.   Stand<>pivot transfer with min/modA towards her stronger L side from w/c to Nustep. SetupA on nustep for R hemibody. L5 resistance for 10 minutes using BLE and LUE, only achieving 90 steps - cues for finding rhythm and keeping cadence >15 steps/minute but pt unable to sustain.  Pt responded well to husband cues and encouragement.  Returned to her room in w/c and insisted patient go to the bathroom prior to returning to bed. Used Stedy for time management and for safety. Sit<>stand in SAmeswith CGA with pt pulling to stand. Sit's in perched position with close supervision. Requires totalA for LB dressing in standing and pt continnet of bladder once on toilet - NT made aware and urine kept in basin for RN to have urine sample. [place] required modA for sit>supine for R hemi body management. Concluded session in bed with all needs met, bed alarm on.  Therapy Documentation Precautions:  Precautions Precautions: Fall Precaution Comments: R hemi, coretrack, apraxic Restrictions Weight Bearing Restrictions: No General:     Therapy/Group: Individual Therapy  Raul Torrance P Amauria Younts 04/13/2022, 7:45 AM

## 2022-04-13 NOTE — Progress Notes (Signed)
3/3 occult stool sample sent to lab.

## 2022-04-14 LAB — GLUCOSE, CAPILLARY
Glucose-Capillary: 134 mg/dL — ABNORMAL HIGH (ref 70–99)
Glucose-Capillary: 143 mg/dL — ABNORMAL HIGH (ref 70–99)
Glucose-Capillary: 123 mg/dL — ABNORMAL HIGH (ref 70–99)
Glucose-Capillary: 94 mg/dL (ref 70–99)

## 2022-04-14 LAB — URINE CULTURE: Culture: 40000 — AB

## 2022-04-14 MED ORDER — PROCHLORPERAZINE 25 MG RE SUPP: 12.5000 mg | Freq: Four times a day (QID) | RECTAL | Status: DC | PRN

## 2022-04-14 MED ORDER — ATORVASTATIN CALCIUM 40 MG PO TABS
40.0000 mg | ORAL_TABLET | Freq: Every day | ORAL | Status: DC
  Administered 2022-04-15 – 2022-04-27 (×13): 40 mg via ORAL
  Filled 2022-04-14 (×13): qty 1

## 2022-04-14 MED ORDER — ASPIRIN 81 MG PO CHEW
81.0000 mg | CHEWABLE_TABLET | Freq: Every day | ORAL | Status: DC
  Administered 2022-04-15 – 2022-04-27 (×13): 81 mg via ORAL
  Filled 2022-04-14 (×13): qty 1

## 2022-04-14 MED ORDER — PANTOPRAZOLE 2 MG/ML SUSPENSION
40.0000 mg | Freq: Every day | ORAL | Status: DC
  Administered 2022-04-14 – 2022-04-24 (×11): 40 mg via ORAL
  Filled 2022-04-14 (×9): qty 20

## 2022-04-14 MED ORDER — TRAMADOL HCL 50 MG PO TABS: 50.0000 mg | ORAL_TABLET | Freq: Four times a day (QID) | ORAL | Status: DC | PRN

## 2022-04-14 MED ORDER — SENNOSIDES-DOCUSATE SODIUM 8.6-50 MG PO TABS: 2.0000 | ORAL_TABLET | Freq: Every day | ORAL | Status: DC

## 2022-04-14 MED ORDER — ALUM & MAG HYDROXIDE-SIMETH 200-200-20 MG/5ML PO SUSP: 30.0000 mL | ORAL | Status: DC | PRN

## 2022-04-14 MED ORDER — ACETAMINOPHEN 325 MG PO TABS
325.0000 mg | ORAL_TABLET | ORAL | Status: DC | PRN
  Administered 2022-04-21 – 2022-04-27 (×3): 650 mg via ORAL
  Filled 2022-04-14 (×3): qty 2

## 2022-04-14 MED ORDER — CARVEDILOL 25 MG PO TABS
25.0000 mg | ORAL_TABLET | Freq: Two times a day (BID) | ORAL | Status: DC
  Administered 2022-04-15 – 2022-04-27 (×25): 25 mg via ORAL
  Filled 2022-04-14 (×25): qty 1

## 2022-04-14 MED ORDER — PROCHLORPERAZINE MALEATE 5 MG PO TABS: 5.0000 mg | ORAL_TABLET | Freq: Four times a day (QID) | ORAL | Status: DC | PRN

## 2022-04-14 MED ORDER — GUAIFENESIN-DM 100-10 MG/5ML PO SYRP: 5.0000 mL | ORAL_SOLUTION | Freq: Four times a day (QID) | ORAL | Status: DC | PRN

## 2022-04-14 MED ORDER — PROCHLORPERAZINE EDISYLATE 10 MG/2ML IJ SOLN: 5.0000 mg | Freq: Four times a day (QID) | INTRAMUSCULAR | Status: DC | PRN

## 2022-04-14 MED ORDER — CLOPIDOGREL BISULFATE 75 MG PO TABS
75.0000 mg | ORAL_TABLET | Freq: Every day | ORAL | Status: AC
  Administered 2022-04-15 – 2022-04-21 (×7): 75 mg via ORAL
  Filled 2022-04-14 (×7): qty 1

## 2022-04-14 NOTE — Progress Notes (Signed)
Occupational Therapy Session Note  Patient Details  Name: Elaine Ford MRN: [id number] Date of Birth: 1946/03/14  Today's Date: 04/14/2022 OT Individual Time: 1415-1530 Total Time 75 min      Short Term Goals: Week 1:  OT Short Term Goal 1 (Week 1): Pt will increase UB dressing and bathing to Max A while demonstrating use of hemi dressing techniques and compensatory strategies. OT Short Term Goal 2 (Week 1): Pt will increase LB bathing and dressing while utilizing AE as needed to increase independence. OT Short Term Goal 3 (Week 1): Pt will utilize her RUE as a stabilizer when completing self feed and grooming tasks. OT Short Term Goal 4 (Week 1): Pt will increase toilet transfer to [place] Therapeutic Interventions/Progress Updates:    Patient agreeable to participate in OT session. Reports 0/10 pain level. Husband present during session and provided update on patient's performance during therapy.  Patient participated in skilled OT session focusing on ADL re-training and functional transfers. Therapist integrated use of RUE as stabilizer during grooming and dressing tasks, standing tolerance while brushing teeth and washing her face standing, in order to improve overall functional performance during BADL tasks requiring less physical assist. Functional transfer from bed to w/c performed with Min A and no device. While seated in w/c at sink, pt completed bathing and dressing. Pt/family education provided regarding hemi dressing technique requiring max assist to complete both UB and LB dressing. Pt able to button PJ top using one hand technique with occasional set-up due to tightness of top. While standing at sink; min A provided on right side due to slight lateral lean; pt washed her face with Set-up and brushed teeth with total assist provided to use right hand as stabilizer for toothbrush when applying toothpaste. Increased time was needed to complete bathing/dressing  although pt motivated and determined to complete as much of task as possible.    Therapy Documentation Precautions:  Precautions Precautions: Fall Precaution Comments: R hemi, coretrack, apraxic Restrictions Weight Bearing Restrictions: No   Therapy/Group: Individual Therapy  [name], OTR/L,CBIS  Supplemental OT - [facility]  04/14/2022, 1:02 PM

## 2022-04-14 NOTE — Progress Notes (Signed)
Physical Therapy Session Note  Patient Details  Name: Elaine Ford MRN: [id number] Date of Birth: [birth year]  Today's Date: 04/14/2022 PT Individual Time: 0800-0856 PT Individual Time Calculation (min): 56 min    Short Term Goals: Week 1:  PT Short Term Goal 1 (Week 1): Pt will complete bed mobility with modA PT Short Term Goal 2 (Week 1): Pt will complete bed<>chair transfers with modA and LRAD PT Short Term Goal 3 (Week 1): Pt will ambulate 49f with modA and LRAD PT Short Term Goal 4 (Week 1): Pt will initiate stair training PT Short Term Goal 5 (Week 1): Pt will participate in functional outcome measure to assess falls risk  Skilled Therapeutic Interventions/Progress Updates:      Pt supine in bed to start with husband at bedside. Agreeable to PT tx but requires convincing. Denies pain. Donned pants at bed level via bridging, modA for threading and for pulling pants over hips.   HOB flat and pt completes supine<>sitting with minA (!) with use of bed rail, towards her weaker R side. Able to sit EOB with supervision. Stand step transfer to w/c with min/modA with face-to-face technique and no use of AD. Transported in w/c to main rehab gym and wheeled inside // bars. Focused remainder of session on gait training in // bars.  Pt able to rise to stand with CGA in // bars by pulling herself to stand. She's able to place her weaker RUE onto the // bar with only minA (!). Ambulated length (84f of // bars x3 times with minA and backwards x3 times with minA as well. Pt demonstrating improved weight shifting and ability to lift and clear RLE during swing phase with hip flexion 3/5 (!!). Hand-over-hand assist for managing RUE along // bar.   Able to progress to side stepping in // bars with minA for stepping R and CGA for stepping L. Hand-over-hand assist for managing RUE along // bar - pt able to manage RLE without physical assist however inconsistent with step lengths while side stepping and  with forward/backward stepping.   Returned to patient's room and she remained seated in w/c with 1/2 lap tray supporting RUE - husband at bedside, all needs met.   Therapy Documentation Precautions:  Precautions Precautions: Fall Precaution Comments: R hemi, coretrack, apraxic Restrictions Weight Bearing Restrictions: No General:     Therapy/Group: Individual Therapy  Briani Maul P Lycan Davee PT 04/14/2022, 7:31 AM

## 2022-04-14 NOTE — Progress Notes (Signed)
PROGRESS NOTE   Subjective/Complaints: Feeling much better this morning and doing great with PT Discussed that if she can eat >50 percent of meals today we can remove Cortrak  ROS: Denies pain   Objective:   US RENAL  Result Date: 04/12/2022 CLINICAL DATA:  Hematuria. EXAM: RENAL / URINARY TRACT ULTRASOUND COMPLETE COMPARISON:  None Available. FINDINGS: Right Kidney: Renal measurements: 9.9 x 5.1 x 4.3 cm = volume: 112 mL. Echogenicity within normal limits. No mass or hydronephrosis visualized. Left Kidney: Renal measurements: 11.4 x 5.4 x 4.1 cm = volume: 131 mL. 1.7 cm cyst is noted in lower pole. Echogenicity within normal limits. No mass or hydronephrosis visualized. Bladder: Appears normal for degree of bladder distention. Other: None. IMPRESSION: No significant renal abnormality is noted. Electronically Signed   By: Marijo Conception M.D.   On: 04/12/2022 15:32    Recent Labs    04/12/22 0450 04/13/22 0641  WBC 9.5 9.6  HGB 11.8* 11.8*  HCT 36.6 36.8  PLT 348 363   Recent Labs    04/12/22 0458  NA 137  K 4.1  CL 101  CO2 26  GLUCOSE 135*  BUN 18  CREATININE 0.72  CALCIUM 9.0    Intake/Output Summary (Last 24 hours) at 04/14/2022 0936 Last data filed at 04/14/2022 0446 Gross per 24 hour  Intake 240 ml  Output 1350 ml  Net -1110 ml         Physical Exam: Vital Signs Blood pressure (!) 158/55, pulse 67, temperature 98.4 F (36.9 C), temperature source Oral, resp. rate 20, height '5\' 4"'$  (1.626 m), weight 86.7 kg, SpO2 98 %. Vitals and nursing note reviewed. BMI 33.45 Constitutional:      Comments: + cortak in nares. Gurgling/sonorous upper airway sounds noted, mild.   Neurological:     Mental Status: She is alert.     Comments: Right inattention with decrease in EOMI to the right. Right facial weakness. Receptive> expressive deficits with delay in processing. + perseverative echolaly. Occasional social  utterance. Able to follow simple motor commands with visual and tactile cues.     General: No acute distress Mood and affect are appropriate Heart: Regular rate and rhythm no rubs murmurs or extra sounds Lungs: upper airway sounds o/w Clear to auscultation, breathing unlabored, no rales or wheezes. Central sonorous sounds.  Abdomen: Positive bowel sounds, +TTP lower abdomen, nondistended Extremities: No clubbing, cyanosis, or edema. Moving LUE antigravity; RUE no appreciable movement in bed.  Skin: No evidence of breakdown, no evidence of rash  PE from prior encounter:  Neurologic:  Trace finger flexin RUE o/w 0/5 3- Right HF, KE, 2- ankle PT/DF Sensory exam reduced light touch LEFT upper and lower extremities  Working with PT in gym Musculoskeletal: no pain with  range of motion in all 4 extremities. No joint swelling      Assessment/Plan: 1. Functional deficits which require 3+ hours per day of interdisciplinary therapy in a comprehensive inpatient rehab setting. Physiatrist is providing close team supervision and 24 hour management of active medical problems listed below. Physiatrist and rehab team continue to assess barriers to discharge/monitor patient progress toward functional and medical goals  Care  Tool:  Bathing    Body parts bathed by patient: Right arm, Chest, Abdomen, Face, Right upper leg, Left upper leg, Front perineal area, Right lower leg, Left lower leg   Body parts bathed by helper: Right lower leg, Left lower leg, Buttocks, Left arm Body parts n/a: Buttocks   Bathing assist Assist Level: Moderate Assistance - Patient 50 - 74%     Upper Body Dressing/Undressing Upper body dressing   What is the patient wearing?: Button up shirt    Upper body assist Assist Level: Moderate Assistance - Patient 50 - 74%    Lower Body Dressing/Undressing Lower body dressing      What is the patient wearing?: Pants, Incontinence brief     Lower body assist Assist for  lower body dressing: Moderate Assistance - Patient 50 - 74%     Toileting Toileting Toileting Activity did not occur (Clothing management and hygiene only): N/A (no void or bm)  Toileting assist Assist for toileting: Moderate Assistance - Patient 50 - 74%     Transfers Chair/bed transfer  Transfers assist  Chair/bed transfer activity did not occur: Safety/medical concerns  Chair/bed transfer assist level: Moderate Assistance - Patient 50 - 74%     Locomotion Ambulation   Ambulation assist      Assist level: Moderate Assistance - Patient 50 - 74% Assistive device: Hand held assist Max distance: 20   Walk 10 feet activity   Assist  Walk 10 feet activity did not occur: Safety/medical concerns  Assist level: Moderate Assistance - Patient - 50 - 74% Assistive device: Hand held assist   Walk 50 feet activity   Assist Walk 50 feet with 2 turns activity did not occur: Safety/medical concerns         Walk 150 feet activity   Assist Walk 150 feet activity did not occur: Safety/medical concerns         Walk 10 feet on uneven surface  activity   Assist Walk 10 feet on uneven surfaces activity did not occur: Safety/medical concerns         Wheelchair     Assist Is the patient using a wheelchair?: Yes Type of Wheelchair: Manual    Wheelchair assist level: Dependent - Patient 0% Max wheelchair distance: 68f    Wheelchair 50 feet with 2 turns activity    Assist        Assist Level: Dependent - Patient 0%   Wheelchair 150 feet activity     Assist      Assist Level: Dependent - Patient 0%   Blood pressure (!) 158/55, pulse 67, temperature 98.4 F (36.9 C), temperature source Oral, resp. rate 20, height '5\' 4"'$  (1.626 m), weight 86.7 kg, SpO2 98 %.    Medical Problem List and Plan: 1. Functional deficits secondary to Left MCA infarct             -patient may  shower             -ELOS/Goals: 14-16d MinA  Start vitamin B/C  supplement  Continue CIR 2.  Antithrombotics: -DVT/anticoagulation:  Mechanical: Sequential compression devices, below knee Bilateral lower extremities             -antiplatelet therapy: DAPT X 3 weeks followed by ASA alone.  3. Pain Management:  Tylenol prn.  4. Mood/Behavior/Sleep: LCSW to follow for evaluation and support.              -antipsychotic agents: N/A 5. Neuropsych/cognition: This patient is not fully capable  of making decisions on her own behalf. 6. Skin/Wound Care: Routine pressure relief measures.  7. Fluids/Electrolytes/Nutrition: Monitor I/O. Check CMET in am. 8.  HTN: Monitor BP TID--continue coreg with BP goal <160. 9. Dysphagia: Discussed with patient, family, and nursing that if she can eat >50% of lunch we can d/c Cortrak today.  Diet Orders (From admission, onward)     Start     Ordered   04/12/22 1548  DIET - DYS 1 Room service appropriate? Yes; Fluid consistency: Nectar Thick  Diet effective now       Comments: Nectar-Thick liquid via cup only (NO STRAWS) Full supervision at meals. NEEDS TO BE SEMI UPRIGHT FOR 60 MINUTES AFTER MEALS.  Question Answer Comment  Room service appropriate? Yes   Fluid consistency: Nectar Thick      04/12/22 1547            10. Hyperglycemia: Hgb A1C- Recent Labs    04/13/22 1630 04/13/22 2101 04/14/22 0608  GLUCAP 124* 97 134*    Decrease CBGs to AC and d/c HS ISS.   11 L-BBB s/p PPM: Brief episode of A flutter. Continue to monitor. 12. Hyperlipidemia: Continue Lipitor 13.  ABLA: Hgb down from 15.1-->12.9-->12.4-->11.7 will monitor for signs of bleeding.  Heme occult ordered 14. UTI: Keflex started. UC with 20,000 E coli, f/u sensitivities            - 10/4 sensitive, however with only 20k, resolving hematuria, and new abdominal pain/nausea, DC abx  15. Chest congestion: chest PT ordered. Decrease TF rate to 50/hour to avoid fluid overload 16. Obesity: BMI 32-->33.45-->32.81: provide dietary education, decrease TF  rate to avoid fluid overload, appears less congested with decrease, discussed with patient and husband, daily weights ordered.  17. Hemorrhoid: witch hazel pads ordered.  18. Gross hematuria: continue to hold lovenox, messaged nursing regarding her hematuria 19. Constipation: milk of mag ordered today - BM 10/4   LOS: 8 days A FACE TO FACE EVALUATION WAS PERFORMED  Clide Deutscher Thang Flett 04/14/2022, 9:36 AM

## 2022-04-14 NOTE — Progress Notes (Addendum)
Speech Language Pathology Weekly Progress and Session Note  Patient Details  Name: Elaine Ford MRN: [id number] Date of Birth: Feb 11, 1946  Beginning of progress report period: April 07, 2022 End of progress report period: April 14, 2022  Today's Date: 04/14/2022 SLP Individual Time: 0930-1030 SLP Total Time Calculations: 60 minutes    Short Term Goals: Week 1: SLP Short Term Goal 1 (Week 1): Pt will demonstrate tolerance of D1 and nectar-thick liquid consistencies with minimal s/sx concerning for aspiration given Mod A cues for implementation of aspiration precautions. - Given Min-Mod A verbal cues to Mod A verbal and tactile cues for implementation of aspiration precautions, pt self-administered PO trials of Dysphagia 1 textures and nectar-thick liquid via cup. Pt with intermittent subtle throat clear <20% of the time. Vocal quality clear following throat clear with RR even and unlabored throughout. Per chart review, medically stable and VSS. GOAL MET  SLP Short Term Goal 2 (Week 1): Pt will receptively ID common objects in field of 2 (vertical presentation) with 80% accuracy given Max A multimodal cues. Pt receptively identified pictures in field of 2 (horizontal placement) with 70% accuracy given Mod A multimodal cues; 90% given Max A multimodal cues.  GOAL MET  SLP Short Term Goal 3 (Week 1): Pt will respond to basic and personal yes/no questions verbally or via alternate means with 80% accuracy given Max A multimodal cues. - Pt responded to personal yes/no questions verbally with ~90% given Max A multimodal cues.  GOAL MET  SLP Short Term Goal 4 (Week 1): Pt will attend to functional and therapeutic tasks for 5 minutes given Mod A. - Attended to functional task of eating and therapeutic language tasks for >5 minutes given Min A verbal cues. GOAL MET  SLP Short Term Goal 5 (Week 1): Pt will utilize AAC (low-tech picture board, gestures) to communicate wants/needs given Max A  multimodal cues. - Pt communicating verbally, though not always producing coherent responses. Limited by verbal perseveration and aphasia. GOAL MET  SLP Short Term Goal 6 (Week 1): Pt will demonstrate orientation x 4 via multimodal communication given Max A multinodal cues. - Pt oriented x 4 via verbal expression given Max to Total A multimodal cues to include verbal choice prompts. GOAL NOT MET    New Short Term Goals: Week 2: SLP Short Term Goal 1 (Week 2): Pt will demonstrate tolerance of D1 and nectar-thick liquid consistencies with minimal s/sx concerning for aspiration given Min A cues for implementation of aspiration precautions. SLP Short Term Goal 2 (Week 2): Pt will receptively ID common objects in field of 2 (vertical presentation) with 80% accuracy given Mod A multimodal cues. SLP Short Term Goal 3 (Week 2): Pt will respond to basic and personal yes/no questions verbally with 80% accuracy given Mod A multimodal cues. SLP Short Term Goal 4 (Week 2): Pt will demonstrate orientation x 4 via multimodal communication given binary choice prompts. SLP Short Term Goal 5 (Week 2): Pt will produce 2-3 word short phrases to describe pictures and/or communication functionally on 90% of opportunities given Max A multimodal cues. SLP Short Term Goal 6 (Week 2): Pt will participate in trials of Dysphagia 2 textures with functional oral clearance and minimal s/sx concering for airway compromise given Min A for implementation of aspiration precautions.  Weekly Progress Updates: Pt has demonstrated progress, this past reporting period, as evident by meeting 5 out of 6 short-term goals outlined above. At this time pt requires Mod A verbal and tactile  cues for use of aspiration precautions, Mod to Max A for basic auditory comprehension and verbal expression, and Max to Total A for orientation. Will set goals accordingly for the next reporting period; long-term goals set between [place] and Mod A. Pt and family  education ongoing. Recommend 24/7 supervision and assistance at time of discharge, as well as OP ST services.  Intensity: Minumum of 1-2 x/day, 30 to 90 minutes Frequency: 3 to 5 out of 7 days Duration/Length of Stay: 2 weeks Treatment/Interventions: Dysphagia/aspiration precaution training;Speech/Language facilitation;Therapeutic Activities;Patient/family education   Daily Session Skilled Therapeutic Interventions:     S: Pt seen this date for skilled ST intervention targeting swallowing and communication goals outlined above. Pt received awake/alert and OOB in w/c. Spouse present and translated for pt intermittently; pt speaking both [place] and Spanish this session. No c/o pain. Agreeable to ST intervention in hospital room. At request of  SLP, observed LPN providing medications crushed via puree with no evidence of airway compromise.   O: Please see above for objective data re: pt's performance on targeted goals. SLP provided ongoing skilled pt and family education re: ST POC, verbal perseveration in relation to aphasia, recommendations for addressing yes/no responses, and aspiration precautions; pt's husband verbalized understanding of all education, and verbalized that he feels pt is improving.   A: Pt remains sitmulable for skilled ST intervention as evident by subtle improvements in responses to yes/no questions, command following, and receptive identification of common nouns given Mod to Max A. Recommend continuation of current diet textures (Dysphagia 1 and nectar-thick liquids via cup). May provide medications crushed in puree; recommendations communicated with LPN.  P: Pt left in room and OOB in w/c with all safety measures activated. Call bell reviewed and within reach and all immediate needs met. 1/2 lap tray donned on w/c to support RUE. Spouse present. Continue per current ST POC next session.  Pain None/Denies  Therapy/Group: Individual Therapy  Verlie Hellenbrand A Lashaye Fisk 04/14/2022,  7:52 PM

## 2022-04-14 NOTE — Progress Notes (Signed)
Calorie Count Note  48 hour calorie count ordered.  Diet: Dys 1; Nectar Thick Liquids  Supplements: None  04/13/22 Breakfast: 218 kcal; 7 gm protein  Lunch: 253 kcal; 12 gm protein  Dinner: 150 kcal; 6 gm protein  Supplements: none   Total intake: 621 kcal (35% of minimum estimated needs)  25 protein (30% of minimum estimated needs)  Spoke with MD and RN in regards to [place]. MD states plan is to remove Cortrak tube and proceed with food PO. Pt ate 95% of her breakfast this am and appears to be doing better. Pt receives a magic cup with each tray. RD will continue to monitor PO intakes.   Nutrition Dx: Inadequate oral intake related to inability to eat as evidenced by NPO status.  Goal: Patient will meet greater than or equal to 90% of their needs  Intervention:  - D/C Cortrak tube  - Monitor and encourage PO intakes   Elaine Ford Graciela Husbands, RD, LDN, CNSC

## 2022-04-15 LAB — GLUCOSE, CAPILLARY
Glucose-Capillary: 100 mg/dL — ABNORMAL HIGH (ref 70–99)
Glucose-Capillary: 127 mg/dL — ABNORMAL HIGH (ref 70–99)
Glucose-Capillary: 130 mg/dL — ABNORMAL HIGH (ref 70–99)
Glucose-Capillary: 131 mg/dL — ABNORMAL HIGH (ref 70–99)

## 2022-04-15 LAB — OCCULT BLOOD X 1 CARD TO LAB, STOOL: Fecal Occult Bld: NEGATIVE

## 2022-04-15 MED ORDER — SENNOSIDES-DOCUSATE SODIUM 8.6-50 MG PO TABS
1.0000 | ORAL_TABLET | Freq: Every day | ORAL | Status: DC
  Administered 2022-04-16 – 2022-04-18 (×3): 1 via ORAL
  Filled 2022-04-15 (×3): qty 1

## 2022-04-15 NOTE — Progress Notes (Signed)
PROGRESS NOTE   Subjective/Complaints: Feeling much better this morning and doing great with PT Discussed that if she can eat >50 percent of meals today we can remove Cortrak  ROS: Denies pain   Objective:   No results found.  Recent Labs    04/13/22 0641  WBC 9.6  HGB 11.8*  HCT 36.8  PLT 363   No results for input(s): "NA", "K", "CL", "CO2", "GLUCOSE", "BUN", "CREATININE", "CALCIUM" in the last 72 hours.   Intake/Output Summary (Last 24 hours) at 04/15/2022 1032 Last data filed at 04/15/2022 0811 Gross per 24 hour  Intake 472 ml  Output 0 ml  Net 472 ml         Physical Exam: Vital Signs Blood pressure (!) 137/53, pulse 64, temperature 98.2 F (36.8 C), temperature source Oral, resp. rate 17, height '5\' 4"'$  (1.626 m), weight 86.7 kg, SpO2 98 %. Vitals and nursing note reviewed. BMI 33.45 Constitutional:      Comments: + cortak in nares. Gurgling/sonorous upper airway sounds noted, mild.   Neurological:     Mental Status: She is alert.     Comments: Right inattention with decrease in EOMI to the right. Right facial weakness. Receptive> expressive deficits with delay in processing. + perseverative echolaly. Occasional social utterance. Able to follow simple motor commands with visual and tactile cues.     General: No acute distress Mood and affect are appropriate Heart: Regular rate and rhythm no rubs murmurs or extra sounds Lungs: upper airway sounds o/w Clear to auscultation, breathing unlabored, no rales or wheezes. Central sonorous sounds.  Abdomen: Positive bowel sounds, +TTP lower abdomen, nondistended Extremities: No clubbing, cyanosis, or edema. Moving LUE antigravity; RUE no appreciable movement in bed.  Skin: No evidence of breakdown, no evidence of rash  PE from prior encounter:  Neurologic:  Trace finger flexin RUE o/w 0/5 3- Right HF, KE, 2- ankle PT/DF Sensory exam reduced light touch LEFT  upper and lower extremities  Working with PT in gym Musculoskeletal: no pain with  range of motion in all 4 extremities. No joint swelling      Assessment/Plan: 1. Functional deficits which require 3+ hours per day of interdisciplinary therapy in a comprehensive inpatient rehab setting. Physiatrist is providing close team supervision and 24 hour management of active medical problems listed below. Physiatrist and rehab team continue to assess barriers to discharge/monitor patient progress toward functional and medical goals  Care Tool:  Bathing    Body parts bathed by patient: Right arm, Chest, Abdomen, Face, Right upper leg, Left upper leg, Front perineal area, Right lower leg, Left lower leg   Body parts bathed by helper: Right lower leg, Left lower leg, Buttocks, Left arm Body parts n/a: Buttocks   Bathing assist Assist Level: Moderate Assistance - Patient 50 - 74%     Upper Body Dressing/Undressing Upper body dressing   What is the patient wearing?: Button up shirt    Upper body assist Assist Level: Moderate Assistance - Patient 50 - 74%    Lower Body Dressing/Undressing Lower body dressing      What is the patient wearing?: Pants, Incontinence brief     Lower body assist  Assist for lower body dressing: Moderate Assistance - Patient 50 - 74%     Toileting Toileting Toileting Activity did not occur (Clothing management and hygiene only): N/A (no void or bm)  Toileting assist Assist for toileting: Moderate Assistance - Patient 50 - 74%     Transfers Chair/bed transfer  Transfers assist  Chair/bed transfer activity did not occur: Safety/medical concerns  Chair/bed transfer assist level: Moderate Assistance - Patient 50 - 74%     Locomotion Ambulation   Ambulation assist      Assist level: Moderate Assistance - Patient 50 - 74% Assistive device: Hand held assist Max distance: 20   Walk 10 feet activity   Assist  Walk 10 feet activity did not occur:  Safety/medical concerns  Assist level: Moderate Assistance - Patient - 50 - 74% Assistive device: Hand held assist   Walk 50 feet activity   Assist Walk 50 feet with 2 turns activity did not occur: Safety/medical concerns         Walk 150 feet activity   Assist Walk 150 feet activity did not occur: Safety/medical concerns         Walk 10 feet on uneven surface  activity   Assist Walk 10 feet on uneven surfaces activity did not occur: Safety/medical concerns         Wheelchair     Assist Is the patient using a wheelchair?: Yes Type of Wheelchair: Manual    Wheelchair assist level: Dependent - Patient 0% Max wheelchair distance: 43f    Wheelchair 50 feet with 2 turns activity    Assist        Assist Level: Dependent - Patient 0%   Wheelchair 150 feet activity     Assist      Assist Level: Dependent - Patient 0%   Blood pressure (!) 137/53, pulse 64, temperature 98.2 F (36.8 C), temperature source Oral, resp. rate 17, height '5\' 4"'$  (1.626 m), weight 86.7 kg, SpO2 98 %.    Medical Problem List and Plan: 1. Functional deficits secondary to Left MCA infarct             -patient may  shower             -ELOS/Goals: 14-16d MinA  Start vitamin B/C supplement  Continue CIR 2.  Antithrombotics: -DVT/anticoagulation:  Mechanical: Sequential compression devices, below knee Bilateral lower extremities             -antiplatelet therapy: DAPT X 3 weeks followed by ASA alone.  3. Pain Management:  Tylenol prn.  4. Mood/Behavior/Sleep: LCSW to follow for evaluation and support.              -antipsychotic agents: N/A 5. Neuropsych/cognition: This patient is not fully capable of making decisions on her own behalf. 6. Skin/Wound Care: Routine pressure relief measures.  7. Fluids/Electrolytes/Nutrition: Monitor I/O. Check CMET in am. 8.  HTN: Monitor BP TID--continue coreg with BP goal <160. 9. Dysphagia: Discussed with patient, family, and nursing  that if she can eat >50% of lunch we can d/c Cortrak today. Asking nursing to provide her with a water chart so we can ensure she is drinking 6-8 glasses of thickened water per day Diet Orders (From admission, onward)     Start     Ordered   04/12/22 1548  DIET - DYS 1 Room service appropriate? Yes; Fluid consistency: Nectar Thick  Diet effective now       Comments: Nectar-Thick liquid via cup only (NO STRAWS)  Full supervision at meals. NEEDS TO BE SEMI UPRIGHT FOR 60 MINUTES AFTER MEALS.  Question Answer Comment  Room service appropriate? Yes   Fluid consistency: Nectar Thick      04/12/22 1547            10. Hyperglycemia: Hgb A1C- Recent Labs    04/14/22 1652 04/14/22 2057 04/15/22 0619  GLUCAP 123* 94 100*    Decrease CBGs to AC and d/c HS ISS.   11 L-BBB s/p PPM: Brief episode of A flutter. Continue to monitor. 12. Hyperlipidemia: Continue Lipitor 13.  ABLA: Hgb down from 15.1-->12.9-->12.4-->11.7 will monitor for signs of bleeding.  Heme occult ordered 14. UTI: Keflex started. UC with 20,000 E coli, f/u sensitivities            - 10/4 sensitive, however with only 20k, resolving hematuria, and new abdominal pain/nausea, DC abx  15. Chest congestion: chest PT ordered. Decrease TF rate to 50/hour to avoid fluid overload 16. Obesity: BMI 32-->33.45-->32.81: provide dietary education, decrease TF rate to avoid fluid overload, appears less congested with decrease, discussed with patient and husband, daily weights ordered.  17. Hemorrhoid: witch hazel pads ordered. Discussed that softening the stool will help to prevent straining and prevent new hemorrhoids from forming.  18. Gross hematuria: continue to hold lovenox, messaged nursing regarding her hematuria 19. Constipation: decrease senna-docusate to 1 tab with dinner.    LOS: 9 days A FACE TO FACE EVALUATION WAS PERFORMED  Elaine Ford 04/15/2022, 10:32 AM

## 2022-04-15 NOTE — Progress Notes (Signed)
Speech Language Pathology Daily Session Note  Patient Details  Name: Elaine Ford MRN: [id number] Date of Birth: Jul 22, 1945  Today's Date: 04/15/2022 SLP Individual Time: 0945-1030 SLP Individual Time Calculation (min): 45 min  Short Term Goals: Week 2: SLP Short Term Goal 1 (Week 2): Pt will demonstrate tolerance of D1 and nectar-thick liquid consistencies with minimal s/sx concerning for aspiration given Min A cues for implementation of aspiration precautions. - Given Min A verbal cues for implementation of aspiration precautions, pt self-administered PO trials of nectar-thick liquid via cup. Pt with intermittent subtle throat clear <20% of the time. Vocal quality clear following throat clear with RR even and unlabored throughout. Per chart review, medically stable and VSS.  SLP Short Term Goal 2 (Week 2): Pt will receptively ID common objects in field of 2 (vertical presentation) with 80% accuracy given Mod A multimodal cues. - Did not formally address.  SLP Short Term Goal 3 (Week 2): Pt will respond to basic and personal yes/no questions verbally with 80% accuracy given Mod A multimodal cues. - ~80% accuracy given Mod-Max A multimodal cues.  SLP Short Term Goal 4 (Week 2): Pt will demonstrate orientation x 4 via multimodal communication given binary choice prompts. - Oriented x 2 given binary choice prompts.  SLP Short Term Goal 5 (Week 2): Pt will produce 2-3 word short phrases to describe pictures and/or communication functionally on 90% of opportunities given Max A multimodal cues. - Min A on ~90% of opportunities.  SLP Short Term Goal 6 (Week 2): Pt will participate in trials of Dysphagia 2 textures with functional oral clearance and minimal s/sx concering for airway compromise given Min A for implementation of aspiration precautions. - Participated in trials of Dysphagia 2 textures with functional oral clearance and no evidence of airway compromise given Min A for implementation of  aspiration precautions.  Skilled Therapeutic Interventions: S: Pt seen this date for skilled ST intervention targeting swallowing and communication goals outlined above. Pt received awake/alert and OOB in w/c. Denies pain. Agreeable to ST intervention in hospital room. Spouse present and available to assist with interpretation as needed. Pleasant and participatory throughout.  O: Please see above for objective data re: pt's performance on targeted goals. SLP provided skilled pt and family education re: ST POC, progress thus far, aspiration precautions, and how to thicken liquids to nectar-thick consistency via gel packs with pt's spouse demonstrating understanding of process following SLP's verbal instructions.  A: Pt remains sitmulable for skilled ST intervention as evident by subtle improvements in responses to yes/no questions, command following, and receptive identification of common nouns given Mod to Max A. Recommend continuation of current diet textures (Dysphagia 1 and nectar-thick liquids via cup). May provide medications crushed in puree.  P: Pt left in room and OOB in w/c with all safety measures activated. Call bell reviewed and within reach and all immediate needs met. Spouse remained at pt's side. Continue per current ST POC next session.  Pain Pain Assessment Pain Scale: 0-10 Pain Score: 0-No pain  Therapy/Group: Individual Therapy  Deniese Oberry A Reid Nawrot 04/15/2022, 1:05 PM

## 2022-04-15 NOTE — Progress Notes (Signed)
Physical Therapy Weekly Progress Note  Patient Details  Name: Elaine Ford MRN: [id number] Date of Birth: 1945-07-15  Beginning of progress report period: April 07, 2022 End of progress report period: April 15, 2022  Today's Date: 04/15/2022 PT Individual Time: 0800-0911 PT Individual Time Calculation (min): 71 min   Patient has met 2 of 5 short term goals.  Mrs. Ucci is making appropriate progress towards LTG of CGA. Overall, she requires modA for bed mobility, minA for sit<>stand transfers, minA for stand<>pivot transfers, and minA for ambulating in // bars ~64f and minA +2 for ambulating ~157f30ft using RW with her R foot ace wrapped for DF assist. She is primarily limited by R inattention, R sided weakness, motor apraxia, and decreased activity tolerance. Her husband has been present for every therapy session for active observing as well as assisting with interpretation [place]  Patient continues to demonstrate the following deficits muscle weakness, decreased cardiorespiratoy endurance, impaired timing and sequencing, unbalanced muscle activation, and motor apraxia, decreased initiation, decreased attention, decreased awareness, decreased problem solving, decreased safety awareness, decreased memory, and delayed processing, and decreased sitting balance, decreased standing balance, decreased postural control, hemiplegia, and decreased balance strategies and therefore will continue to benefit from skilled PT intervention to increase functional independence with mobility.  Patient progressing toward long term goals..  Continue plan of care.  PT Short Term Goals Week 1:  PT Short Term Goal 1 (Week 1): Pt will complete bed mobility with modA PT Short Term Goal 1 - Progress (Week 1): Met PT Short Term Goal 2 (Week 1): Pt will complete bed<>chair transfers with modA and LRAD PT Short Term Goal 2 - Progress (Week 1): Met PT Short Term Goal 3 (Week  1): Pt will ambulate 5069fith modA and LRAD PT Short Term Goal 3 - Progress (Week 1): Not met PT Short Term Goal 4 (Week 1): Pt will initiate stair training PT Short Term Goal 4 - Progress (Week 1): Not met PT Short Term Goal 5 (Week 1): Pt will participate in functional outcome measure to assess falls risk PT Short Term Goal 5 - Progress (Week 1): Not met Week 2:  PT Short Term Goal 1 (Week 2): Pt will complete bed mobility with minA PT Short Term Goal 2 (Week 2): Husband will assist patient with bed<>chair transfers PT Short Term Goal 3 (Week 2): Pt will ambulate 64f40fth minA and LRAD PT Short Term Goal 4 (Week 2): Pt will initiate stair training  Skilled Therapeutic Interventions/Progress Updates:       RN and NT using [place] to transfer patient into the bathroom upon PT arrival - team requesting stool sample so nursing obtaining. Discussed with husband while this was happening, general DC planning, PT goals, PT POC, etc. Husband confirms 13 steps to 2nd floor but is willing to accommodate downstairs space for bed if needed. 1 step to enter home.   Assisted patient with donning pants with maxA for threading and pulling over hips in standing - moderate R lean in standing with decreased awareness and inability to self correct.  Transported patient in w/c to main rehab gym for time management and energy conservation.   Focused remainder of session on gait training while introducing RW. Adjusted RW to fit, applied RUE saddle splint to RW as well. Ace wrapped R foot for DF assist (pt has 3/5 ankle strength but insufficient during dynamic movements with inattention).   Ambulated ~10ft57f10ft 87f  62f + ~365f+ ~2055f ~82f21fth RW and minA with husband providing w/c follow for safety. Assist for lateral weight shifting, upright posture, and managing her R trunk lean that worsens with fatigue. Max cues for lifting and stepping her RLE which she was able to manage without external assist but  inconsistent in step length. Narrow BOS with R foot placement contributing to R trunk lean.  *Extended seated rest breaks after each gait trial for recovery.  Returned to room - seated in w/c with 1/2 lap tray supporting paretic UE, all needs met.    Therapy Documentation Precautions:  Precautions Precautions: Fall Precaution Comments: R hemi, coretrack, apraxic Restrictions Weight Bearing Restrictions: No General:     Therapy/Group: Individual Therapy  Quin Mathenia P Byren Pankow PT 04/15/2022, 7:54 AM

## 2022-04-15 NOTE — Progress Notes (Signed)
Occupational Therapy Session Note  Patient Details  Name: Elaine Ford MRN: [id number] Date of Birth: 21-Jan-1946  Today's Date: 04/15/2022 OT Individual Time: 1115-1145 OT Individual Time Calculation (min): 30 min    Short Term Goals: Week 1:  OT Short Term Goal 1 (Week 1): Pt will increase UB dressing and bathing to Max A while demonstrating use of hemi dressing techniques and compensatory strategies. OT Short Term Goal 2 (Week 1): Pt will increase LB bathing and dressing while utilizing AE as needed to increase independence. OT Short Term Goal 3 (Week 1): Pt will utilize her RUE as a stabilizer when completing self feed and grooming tasks. OT Short Term Goal 4 (Week 1): Pt will increase toilet transfer to [place] A using LRAD  Skilled Therapeutic Interventions/Progress Updates:    Patient received seated in wheelchair - neuromuscular reeducation to address postural control, midline orientation, stand balance, stand tolerance, and functional use of RUE for wiping horizontal surfaces, and hand to mouth pattern with cup.  Patient showing improved ability to adjust tension in hand when task required.  Patient walked 5 feet with mod assist and without assistive device!  Patient returned to room in wheelchair with husband.    Therapy Documentation Precautions:  Precautions Precautions: Fall Precaution Comments: R hemi, coretrack, apraxic Restrictions Weight Bearing Restrictions: No  Pain:  Reported pain in thumb with abduction    Therapy/Group: Individual Therapy  Mariah Milling 04/15/2022, 12:26 PM

## 2022-04-15 NOTE — Progress Notes (Signed)
Occupational Therapy Session Note  Patient Details  Name: Elaine Ford MRN: [id number] Date of Birth: 04-Nov-1945  Today's Date: 04/15/2022 OT Individual Time: 1345-1500 OT Individual Time Calculation (min): 75 min    Short Term Goals: Week 1:  OT Short Term Goal 1 (Week 1): Pt will increase UB dressing and bathing to Max A while demonstrating use of hemi dressing techniques and compensatory strategies. OT Short Term Goal 2 (Week 1): Pt will increase LB bathing and dressing while utilizing AE as needed to increase independence. OT Short Term Goal 3 (Week 1): Pt will utilize her RUE as a stabilizer when completing self feed and grooming tasks. OT Short Term Goal 4 (Week 1): Pt will increase toilet transfer to [place] Therapeutic Interventions/Progress Updates:   Patient agreeable to participate in OT session. Reports 0/10 pain level. Cortrak removed yesterday after therapy. Husband reports that pt ate 100% of her lunch today!  Patient participated in skilled OT session focusing on NM re-ed. Therapist integrated standing balance and tolerance along with visual scanning while participating in [place] program in order to improve right side visual and physical awareness while participating in self care tasks. See below for BITS results and activities.   BITS program  Completed standing with RW using LUE. Min A provided while standing for balance.  Visual Scanning: single target 3 trials. Trial times: 1', 2', 2' 1st and 2nd trial: difficulty locating blue circle on right side of vision 3rd trial: 100%  Visual Scanning/Complex Array/Sequencing Completed standing with RUE on RW and LUE used to tap screen. Min A proved while standing for balance 1 trial complete standing for 7'30". Able to make it through letters A-M with Max difficulty requiring Max VC and visual cues to locate letters in correct sequence. Able to locate 2 letters without assistance needed.   Seated AA/ROM RUE  completed using vibration to facilitate muscle activation to perform protraction, retraction, elbow flexion and extension. Pt able to elicit some movement during retraction and elbow extension only. Required physical assist to refrain from compensatory trunk movements. Pt attempted to utilize compensatory muscles during all activities. Upon returning home, pt completed functional transfer from bed to w/c with RW with Min A. Total required for right hand set-up on walker hand splint. VC to initiation forward step with right foot each time with Mod A to provide active assist to RLE due to fatigue. Max A to transition from sit to supine.  Therapy Documentation Precautions:  Precautions Precautions: Fall Precaution Comments: R hemi, apraxic Restrictions Weight Bearing Restrictions: No   Therapy/Group: Individual Therapy  [name], OTR/L,CBIS  Supplemental OT - [facility]  04/15/2022, 2:09 PM

## 2022-04-15 NOTE — Progress Notes (Signed)
Calorie count x48 hours completed.   04/13/22 0845 04/13/22 1252 04/13/22 1805  Intake (mL)  P.O. 240 mL 120 mL 120 mL  Percent Meals Eaten (%) 55 % 40 % 40 %    04/14/22 0939 04/14/22 1305 04/14/22 1849  Intake (mL)  P.O. 240 mL 118 mL 236 mL  Percent Meals Eaten (%) 95 % 50 % 50 %    04/15/22 0811 04/15/22 1302 04/15/22 1836  Intake (mL)  P.O. 118 mL 118 mL 118 mL  Percent Meals Eaten (%) 90 % 95 % 95 %    04/15/22 1859  Intake (mL)  P.O. 118 mL  Percent Meals Eaten (%) 100 %

## 2022-04-15 NOTE — Progress Notes (Signed)
Patient ID: Elaine Ford, female   DOB: [birth year], 76 y.o.   MRN: [id number] Met with the patient, son and DIL to review current situation, rehab process, team conference and plan of care. Discussed secondary risk management with medications. Reviewed encourage fluids w dysphagia diet restrictions/thicken liquids. Son going back home Sunday and patient's spouse will be the primary caregiver. Continue to follow along to address educational needs to facilitate preparation for discharge home. Margarito Liner

## 2022-04-16 DIAGNOSIS — K5900 Constipation, unspecified: Secondary | ICD-10-CM

## 2022-04-16 DIAGNOSIS — R21 Rash and other nonspecific skin eruption: Secondary | ICD-10-CM

## 2022-04-16 DIAGNOSIS — I10 Essential (primary) hypertension: Secondary | ICD-10-CM

## 2022-04-16 DIAGNOSIS — D62 Acute posthemorrhagic anemia: Secondary | ICD-10-CM

## 2022-04-16 LAB — GLUCOSE, CAPILLARY
Glucose-Capillary: 116 mg/dL — ABNORMAL HIGH (ref 70–99)
Glucose-Capillary: 128 mg/dL — ABNORMAL HIGH (ref 70–99)
Glucose-Capillary: 113 mg/dL — ABNORMAL HIGH (ref 70–99)
Glucose-Capillary: 117 mg/dL — ABNORMAL HIGH (ref 70–99)

## 2022-04-16 MED ORDER — AMLODIPINE BESYLATE 2.5 MG PO TABS
2.5000 mg | ORAL_TABLET | Freq: Every day | ORAL | Status: DC
  Administered 2022-04-16 – 2022-04-20 (×5): 2.5 mg via ORAL
  Filled 2022-04-16 (×5): qty 1

## 2022-04-16 NOTE — Progress Notes (Signed)
Physical Therapy Session Note  Patient Details  Name: EMBRIE MIKKELSEN MRN: [id number] Date of Birth: January 23, 1946  Today's Date: 04/16/2022 PT Individual Time: 0800-0900 PT Individual Time Calculation (min): 60 min   Short Term Goals: Week 2:  PT Short Term Goal 1 (Week 2): Pt will complete bed mobility with minA PT Short Term Goal 2 (Week 2): Husband will assist patient with bed<>chair transfers PT Short Term Goal 3 (Week 2): Pt will ambulate 57f with minA and LRAD PT Short Term Goal 4 (Week 2): Pt will initiate stair training  Skilled Therapeutic Interventions/Progress Updates:       Pt supine in bed on arrival with husband at bedside. Husband present throughout session for interpretation as needed. Encouraged patient to toilet as it's been >4 hours.   Supine<>sitting EOB with minA for trunk support only - pt able to manage RLE without assist to EOB!  [place]. [place] and pt continent of bowel and bladder - totalA for posterior pericare. Charted in flowsheets.   Donned pants with maxA for threading and pulling over hips in standing, R lean in standing that required correcting.   Transported in w/c to main rehab gym for time management and energy conservation.   Focused remainder of session on gait training using RW and RUE saddle splint. Ambulated ~285f+ ~2053fith minA and RW with husband providing w/c follow for safety. MinA needed for trunk support due to R trunk lean and TC for RLE facilitation for initiating swing phase. Pt with inconsistent step lengths on R, sometimes insufficient. Pt easily distracted externally from gym environment, inhibiting gait mechanics and quality.   Worked on RLE initiation and dynamic standing balance with standing ball kicks with husband rolling ball to patient. minA needed for trunk support due to frequent leaning to the R and she had x1 LOB that was not self correct resulting in  sitting on PT's knee - able to quickly rise to standing from that position. Pt unable to adequately step backwards with RLE and required totalA for placement after kicking the ball.   Pt returned to her room and remained seated in w/c with 1/2 lap tray for RUE, husband at bedside. All needs met.   Therapy Documentation Precautions:  Precautions Precautions: Fall Precaution Comments: R hemi, apraxic Restrictions Weight Bearing Restrictions: No General:    Therapy/Group: Individual Therapy  ChrAlger Simons/01/2022, 7:41 AM

## 2022-04-16 NOTE — Progress Notes (Signed)
PROGRESS NOTE   Subjective/Complaints: BP has been a little elevated. She has a small area of rash on left lower face, not painful or itching.   ROS: Denies pain   Objective:   No results found.  No results for input(s): "WBC", "HGB", "HCT", "PLT" in the last 72 hours.  No results for input(s): "NA", "K", "CL", "CO2", "GLUCOSE", "BUN", "CREATININE", "CALCIUM" in the last 72 hours.   Intake/Output Summary (Last 24 hours) at 04/16/2022 1413 Last data filed at 04/16/2022 1317 Gross per 24 hour  Intake 596 ml  Output 0 ml  Net 596 ml          Physical Exam: Vital Signs Blood pressure (!) 157/57, pulse 65, temperature 98.4 F (36.9 C), temperature source Oral, resp. rate 19, height '5\' 4"'$  (1.626 m), weight 86.1 kg, SpO2 99 %. Vitals and nursing note reviewed. BMI 33.45 Constitutional:      Comments:  NAD, sitting in chair, cortrak has been removed Neurological:     Mental Status: She is alert.     Comments: Right inattention with decrease in EOMI to the right. Right facial weakness. Receptive> expressive deficits with delay in processing. + perseverative echolaly. Occasional social utterance. Able to follow simple motor commands with visual and tactile cues.     General: No acute distress Mood and affect are appropriate Heart: Regular rate and rhythm no rubs murmurs or extra sounds Lungs: CTAB, breathing unlabored, no rales or wheezes.  Abdomen: Positive bowel sounds, +TTP lower abdomen, nondistended Extremities: No clubbing, cyanosis, or edema. Moving LUE antigravity; RUE no appreciable movement in bed.  Skin: No evidence of breakdown, small area of rash left face inferior and lateral to mouth  PE from prior encounter:  Neurologic:  Trace finger flexin RUE o/w 0/5 3- Right HF, KE, 2- ankle PT/DF Sensory exam reduced light touch LEFT upper and lower extremities Musculoskeletal: no pain with  range of motion in all 4  extremities. No joint swelling      Assessment/Plan: 1. Functional deficits which require 3+ hours per day of interdisciplinary therapy in a comprehensive inpatient rehab setting. Physiatrist is providing close team supervision and 24 hour management of active medical problems listed below. Physiatrist and rehab team continue to assess barriers to discharge/monitor patient progress toward functional and medical goals  Care Tool:  Bathing    Body parts bathed by patient: Right arm, Chest, Abdomen, Face, Right upper leg, Left upper leg, Front perineal area, Right lower leg, Left lower leg   Body parts bathed by helper: Right lower leg, Left lower leg, Buttocks, Left arm Body parts n/a: Buttocks   Bathing assist Assist Level: Moderate Assistance - Patient 50 - 74%     Upper Body Dressing/Undressing Upper body dressing   What is the patient wearing?: Button up shirt    Upper body assist Assist Level: Moderate Assistance - Patient 50 - 74%    Lower Body Dressing/Undressing Lower body dressing      What is the patient wearing?: Pants, Incontinence brief     Lower body assist Assist for lower body dressing: Moderate Assistance - Patient 50 - 74%     Naval architect Activity  did not occur Landscape architect and hygiene only): N/A (no void or bm)  Toileting assist Assist for toileting: Moderate Assistance - Patient 50 - 74%     Transfers Chair/bed transfer  Transfers assist  Chair/bed transfer activity did not occur: Safety/medical concerns  Chair/bed transfer assist level: Moderate Assistance - Patient 50 - 74%     Locomotion Ambulation   Ambulation assist      Assist level: Moderate Assistance - Patient 50 - 74% Assistive device: Hand held assist Max distance: 20   Walk 10 feet activity   Assist  Walk 10 feet activity did not occur: Safety/medical concerns  Assist level: Moderate Assistance - Patient - 50 - 74% Assistive device: Hand  held assist   Walk 50 feet activity   Assist Walk 50 feet with 2 turns activity did not occur: Safety/medical concerns         Walk 150 feet activity   Assist Walk 150 feet activity did not occur: Safety/medical concerns         Walk 10 feet on uneven surface  activity   Assist Walk 10 feet on uneven surfaces activity did not occur: Safety/medical concerns         Wheelchair     Assist Is the patient using a wheelchair?: Yes Type of Wheelchair: Manual    Wheelchair assist level: Dependent - Patient 0% Max wheelchair distance: 40f    Wheelchair 50 feet with 2 turns activity    Assist        Assist Level: Dependent - Patient 0%   Wheelchair 150 feet activity     Assist      Assist Level: Dependent - Patient 0%   Blood pressure (!) 157/57, pulse 65, temperature 98.4 F (36.9 C), temperature source Oral, resp. rate 19, height '5\' 4"'$  (1.626 m), weight 86.1 kg, SpO2 99 %.    Medical Problem List and Plan: 1. Functional deficits secondary to Left MCA infarct             -patient may  shower             -ELOS/Goals: 14-16d MinA  Start vitamin B/C supplement  Continue CIR 2.  Antithrombotics: -DVT/anticoagulation:  Mechanical: Sequential compression devices, below knee Bilateral lower extremities             -antiplatelet therapy: DAPT X 3 weeks followed by ASA alone.  3. Pain Management:  Tylenol prn.  4. Mood/Behavior/Sleep: LCSW to follow for evaluation and support.              -antipsychotic agents: N/A 5. Neuropsych/cognition: This patient is not fully capable of making decisions on her own behalf. 6. Skin/Wound Care: Routine pressure relief measures.  7. Fluids/Electrolytes/Nutrition: Monitor I/O. Check CMET in am. 8.  HTN: Monitor BP TID--continue coreg with BP goal <160.  -10/7 BP has been a little elevated, will add low dose amlodipine 2.'5mg'$  9. Dysphagia: Discussed with patient, family, and nursing that if she can eat >50% of lunch  we can d/c Cortrak today. Asking nursing to provide her with a water chart so we can ensure she is drinking 6-8 glasses of thickened water per day Diet Orders (From admission, onward)     Start     Ordered   04/12/22 1548  DIET - DYS 1 Room service appropriate? Yes; Fluid consistency: Nectar Thick  Diet effective now       Comments: Nectar-Thick liquid via cup only (NO STRAWS) Full supervision at meals. NEEDS TO  BE SEMI UPRIGHT FOR 60 MINUTES AFTER MEALS.  Question Answer Comment  Room service appropriate? Yes   Fluid consistency: Nectar Thick      04/12/22 1547            10. Hyperglycemia: Hgb A1C- Recent Labs    04/15/22 2116 04/16/22 0620 04/16/22 1134  GLUCAP 131* 116* 128*     Decrease CBGs to AC and d/c HS ISS.   11 L-BBB s/p PPM: Brief episode of A flutter. Continue to monitor. 12. Hyperlipidemia: Continue Lipitor 13.  ABLA: Hgb down from 15.1-->12.9-->12.4-->11.7 will monitor for signs of bleeding.  Heme occult ordered  -10/7 heme occult has been negative 14. UTI: Keflex started. UC with 20,000 E coli, f/u sensitivities            - 10/4 sensitive, however with only 20k, resolving hematuria, and new abdominal pain/nausea, DC abx  15. Chest congestion: chest PT ordered. Decrease TF rate to 50/hour to avoid fluid overload 16. Obesity: BMI 32-->33.45-->32.81: provide dietary education, decrease TF rate to avoid fluid overload, appears less congested with decrease, discussed with patient and husband, daily weights ordered.  17. Hemorrhoid: witch hazel pads ordered. Discussed that softening the stool will help to prevent straining and prevent new hemorrhoids from forming.  18. Gross hematuria: continue to hold lovenox, messaged nursing regarding her hematuria 19. Constipation: decrease senna-docusate to 1 tab with dinner.   -LBM today 10/7 improved 20. Small area of rash L face  -possibly minor contact dermatitis, avoid new soaps or orther possible irritants, continue to  monitor for now  LOS: 10 days A FACE TO FACE EVALUATION WAS PERFORMED  Elaine Ford 04/16/2022, 2:13 PM

## 2022-04-16 NOTE — Progress Notes (Signed)
Occupational Therapy Session Note  Patient Details  Name: Elaine Ford MRN: [id number] Date of Birth: June 07, 1946  Today's Date: 04/16/2022 OT Individual Time: 0215-0245 OT Individual Time Calculation (min): 30 min    Short Term Goals: Week 1:  OT Short Term Goal 1 (Week 1): Pt will increase UB dressing and bathing to Max A while demonstrating use of hemi dressing techniques and compensatory strategies. OT Short Term Goal 2 (Week 1): Pt will increase LB bathing and dressing while utilizing AE as needed to increase independence. OT Short Term Goal 3 (Week 1): Pt will utilize her RUE as a stabilizer when completing self feed and grooming tasks. OT Short Term Goal 4 (Week 1): Pt will increase toilet transfer to [place] Therapeutic Interventions/Progress Updates:    The pt was in bed upon arrival with family present at the time of treatment.  The pt was able to transfer from bed level to w/c using a scooting technique with ModA for dynamic balance.  At w/c  LOF the pt completed UB theraex in her living quarter,tolerating manual manipulation of the scapular to improve functional mobility of the RUE.  The pt was instructed regarding the return of function being distal to proximal .  The pt followed up with NMR exercise to the RUE involving wt bearing through the extremity 1 set of 10.  The pt went on to complete AAROM exercises for shld flexion to improve strength for gains with bilateral hand manipulation The pt remain at w/c LOF with the family present and  her call light and bedside table within reach.  The pt had no report of pain this treatment session.    Therapy Documentation Precautions:  Precautions Precautions: Fall Precaution Comments: R hemi, apraxic Restrictions Weight Bearing Restrictions: No   Therapy/Group: Individual Therapy  Yvonne Kendall 04/16/2022, 5:23 PM

## 2022-04-17 LAB — GLUCOSE, CAPILLARY
Glucose-Capillary: 103 mg/dL — ABNORMAL HIGH (ref 70–99)
Glucose-Capillary: 136 mg/dL — ABNORMAL HIGH (ref 70–99)
Glucose-Capillary: 101 mg/dL — ABNORMAL HIGH (ref 70–99)
Glucose-Capillary: 115 mg/dL — ABNORMAL HIGH (ref 70–99)

## 2022-04-17 NOTE — Progress Notes (Signed)
PROGRESS NOTE   Subjective/Complaints: Pt seen at bedside. Son in the room. No new concerns elicited.   ROS: Denies pain   Objective:   No results found.  No results for input(s): "WBC", "HGB", "HCT", "PLT" in the last 72 hours.  No results for input(s): "NA", "K", "CL", "CO2", "GLUCOSE", "BUN", "CREATININE", "CALCIUM" in the last 72 hours.   Intake/Output Summary (Last 24 hours) at 04/17/2022 1639 Last data filed at 04/17/2022 1450 Gross per 24 hour  Intake 660 ml  Output --  Net 660 ml          Physical Exam: Vital Signs Blood pressure (!) 128/58, pulse 66, temperature 98.1 F (36.7 C), temperature source Oral, resp. rate 18, height '5\' 4"'$  (1.626 m), weight 87.5 kg, SpO2 98 %. Vitals and nursing note reviewed. BMI 33.45 Constitutional:      Comments:  NAD, in bed, cortrak has been removed Neurological:     Mental Status: drowsy but easily awakened    Comments: Right inattention with decrease in EOMI to the right. Right facial weakness. Receptive> expressive deficits with delay in processing. + perseverative echolaly. Occasional social utterance. Able to follow simple motor commands with visual and tactile cues.     General: No acute distress Mood and affect are appropriate Heart: Regular rate and rhythm no rubs murmurs or extra sounds Lungs: CTAB, breathing unlabored, no rales or wheezes.  Abdomen: Positive bowel sounds, +TTP lower abdomen, nondistended Extremities: No clubbing, cyanosis, or edema. Moving LUE antigravity; RUE no appreciable movement in bed.  Skin: No evidence of breakdown, small area of rash left face inferior and lateral to mouth- a little improved today  PE from prior encounter:  Neurologic:  Trace finger flexin RUE o/w 0/5 3- Right HF, KE, 2- ankle PT/DF Sensory exam reduced light touch LEFT upper and lower extremities Musculoskeletal: no pain with  range of motion in all 4 extremities. No  joint swelling      Assessment/Plan: 1. Functional deficits which require 3+ hours per day of interdisciplinary therapy in a comprehensive inpatient rehab setting. Physiatrist is providing close team supervision and 24 hour management of active medical problems listed below. Physiatrist and rehab team continue to assess barriers to discharge/monitor patient progress toward functional and medical goals  Care Tool:  Bathing    Body parts bathed by patient: Right arm, Chest, Abdomen, Face, Right upper leg, Left upper leg, Front perineal area, Right lower leg, Left lower leg   Body parts bathed by helper: Right lower leg, Left lower leg, Buttocks, Left arm Body parts n/a: Buttocks   Bathing assist Assist Level: Moderate Assistance - Patient 50 - 74%     Upper Body Dressing/Undressing Upper body dressing   What is the patient wearing?: Button up shirt    Upper body assist Assist Level: Moderate Assistance - Patient 50 - 74%    Lower Body Dressing/Undressing Lower body dressing      What is the patient wearing?: Pants, Incontinence brief     Lower body assist Assist for lower body dressing: Moderate Assistance - Patient 50 - 74%     Toileting Toileting Toileting Activity did not occur Landscape architect and hygiene  only): N/A (no void or bm)  Toileting assist Assist for toileting: Moderate Assistance - Patient 50 - 74%     Transfers Chair/bed transfer  Transfers assist  Chair/bed transfer activity did not occur: Safety/medical concerns  Chair/bed transfer assist level: Moderate Assistance - Patient 50 - 74%     Locomotion Ambulation   Ambulation assist      Assist level: Moderate Assistance - Patient 50 - 74% Assistive device: Hand held assist Max distance: 20   Walk 10 feet activity   Assist  Walk 10 feet activity did not occur: Safety/medical concerns  Assist level: Moderate Assistance - Patient - 50 - 74% Assistive device: Hand held assist    Walk 50 feet activity   Assist Walk 50 feet with 2 turns activity did not occur: Safety/medical concerns         Walk 150 feet activity   Assist Walk 150 feet activity did not occur: Safety/medical concerns         Walk 10 feet on uneven surface  activity   Assist Walk 10 feet on uneven surfaces activity did not occur: Safety/medical concerns         Wheelchair     Assist Is the patient using a wheelchair?: Yes Type of Wheelchair: Manual    Wheelchair assist level: Dependent - Patient 0% Max wheelchair distance: 47f    Wheelchair 50 feet with 2 turns activity    Assist        Assist Level: Dependent - Patient 0%   Wheelchair 150 feet activity     Assist      Assist Level: Dependent - Patient 0%   Blood pressure (!) 128/58, pulse 66, temperature 98.1 F (36.7 C), temperature source Oral, resp. rate 18, height '5\' 4"'$  (1.626 m), weight 87.5 kg, SpO2 98 %.    Medical Problem List and Plan: 1. Functional deficits secondary to Left MCA infarct             -patient may  shower             -ELOS/Goals: 14-16d MinA  Start vitamin B/C supplement  Continue CIR 2.  Antithrombotics: -DVT/anticoagulation:  Mechanical: Sequential compression devices, below knee Bilateral lower extremities             -antiplatelet therapy: DAPT X 3 weeks followed by ASA alone.  3. Pain Management:  Tylenol prn.  4. Mood/Behavior/Sleep: LCSW to follow for evaluation and support.              -antipsychotic agents: N/A 5. Neuropsych/cognition: This patient is not fully capable of making decisions on her own behalf. 6. Skin/Wound Care: Routine pressure relief measures.  7. Fluids/Electrolytes/Nutrition: Monitor I/O. Check CMET in am. 8.  HTN: Monitor BP TID--continue coreg with BP goal <160.  -10/7 BP has been a little elevated, will add low dose amlodipine 2.'5mg'$   -10/8 BP intermittently elevated, continue to monitor response amlodipine 9. Dysphagia: Discussed  with patient, family, and nursing that if she can eat >50% of lunch we can d/c Cortrak today. Asking nursing to provide her with a water chart so we can ensure she is drinking 6-8 glasses of thickened water per day Diet Orders (From admission, onward)     Start     Ordered   04/12/22 1548  DIET - DYS 1 Room service appropriate? Yes; Fluid consistency: Nectar Thick  Diet effective now       Comments: Nectar-Thick liquid via cup only (NO STRAWS) Full supervision at  meals. NEEDS TO BE SEMI UPRIGHT FOR 60 MINUTES AFTER MEALS.  Question Answer Comment  Room service appropriate? Yes   Fluid consistency: Nectar Thick      04/12/22 1547            10. Hyperglycemia: Hgb A1C-5.5 Recent Labs    04/17/22 0632 04/17/22 1129 04/17/22 1637  GLUCAP 103* 136* 101*     Decrease CBGs to AC and d/c HS ISS.  CBGs well controlled  11 L-BBB s/p PPM: Brief episode of A flutter. Continue to monitor. 12. Hyperlipidemia: Continue Lipitor 13.  ABLA: Hgb down from 15.1-->12.9-->12.4-->11.7 will monitor for signs of bleeding.  Heme occult ordered  -10/7 heme occult has been negative 14. UTI: Keflex started. UC with 20,000 E coli, f/u sensitivities            - 10/4 sensitive, however with only 20k, resolving hematuria, and new abdominal pain/nausea, DC abx  15. Chest congestion: chest PT ordered. Decrease TF rate to 50/hour to avoid fluid overload 16. Obesity: BMI 32-->33.45-->32.81: provide dietary education, decrease TF rate to avoid fluid overload, appears less congested with decrease, discussed with patient and husband, daily weights ordered.  17. Hemorrhoid: witch hazel pads ordered. Discussed that softening the stool will help to prevent straining and prevent new hemorrhoids from forming.  18. Gross hematuria: continue to hold lovenox, messaged nursing regarding her hematuria 19. Constipation: decrease senna-docusate to 1 tab with dinner.   -LBM today 10/8, improved 20. Small area of rash L  face  -possibly minor contact dermatitis, avoid new soaps or orther possible irritants, continue to monitor for now, appears to be improving  LOS: 11 days A FACE TO FACE EVALUATION WAS PERFORMED  Jennye Boroughs 04/17/2022, 4:39 PM

## 2022-04-18 LAB — BASIC METABOLIC PANEL
Anion gap: 10 (ref 5–15)
BUN: 18 mg/dL (ref 8–23)
CO2: 27 mmol/L (ref 22–32)
Calcium: 9.3 mg/dL (ref 8.9–10.3)
Chloride: 105 mmol/L (ref 98–111)
Creatinine, Ser: 0.79 mg/dL (ref 0.44–1.00)
GFR, Estimated: >60 mL/min (ref >60)
Glucose, Bld: 96 mg/dL (ref 70–99)
Potassium: 3.7 mmol/L (ref 3.5–5.1)
Sodium: 142 mmol/L (ref 135–145)

## 2022-04-18 LAB — CBC WITH DIFFERENTIAL/PLATELET
Abs Immature Granulocytes: 0.01 10*3/uL (ref 0.00–0.07)
Basophils Absolute: 0 10*3/uL (ref 0.0–0.1)
Basophils Relative: 0 %
Eosinophils Absolute: 0.1 10*3/uL (ref 0.0–0.5)
Eosinophils Relative: 1 %
HCT: 37.3 % (ref 36.0–46.0)
Hemoglobin: 12.2 g/dL (ref 12.0–15.0)
Immature Granulocytes: 0 %
Lymphocytes Relative: 20 %
Lymphs Abs: 1.7 10*3/uL (ref 0.7–4.0)
MCH: 27.6 pg (ref 26.0–34.0)
MCHC: 32.7 g/dL (ref 30.0–36.0)
MCV: 84.4 fL (ref 80.0–100.0)
Monocytes Absolute: 0.4 10*3/uL (ref 0.1–1.0)
Monocytes Relative: 5 %
Neutro Abs: 6 10*3/uL (ref 1.7–7.7)
Neutrophils Relative %: 74 %
Platelets: 337 10*3/uL (ref 150–400)
RBC: 4.42 MIL/uL (ref 3.87–5.11)
RDW: 14.7 % (ref 11.5–15.5)
WBC: 8.3 10*3/uL (ref 4.0–10.5)
nRBC: 0 % (ref 0.0–0.2)

## 2022-04-18 LAB — GLUCOSE, CAPILLARY
Glucose-Capillary: 98 mg/dL (ref 70–99)
Glucose-Capillary: 121 mg/dL — ABNORMAL HIGH (ref 70–99)
Glucose-Capillary: 123 mg/dL — ABNORMAL HIGH (ref 70–99)

## 2022-04-18 MED ORDER — MAGNESIUM GLUCONATE 500 MG PO TABS
500.0000 mg | ORAL_TABLET | Freq: Every day | ORAL | Status: DC
  Administered 2022-04-18 – 2022-04-26 (×9): 500 mg via ORAL
  Filled 2022-04-18 (×9): qty 1

## 2022-04-18 MED ORDER — ORAL CARE MOUTH RINSE: 15.0000 mL | Freq: Three times a day (TID) | OROMUCOSAL | 0 refills | Status: DC

## 2022-04-18 MED ORDER — ACETAMINOPHEN 325 MG PO TABS: 325.0000 mg | ORAL_TABLET | ORAL | Status: DC | PRN

## 2022-04-18 MED ORDER — VITAMIN D (ERGOCALCIFEROL) 1.25 MG (50000 UNIT) PO CAPS
50000.0000 [IU] | ORAL_CAPSULE | ORAL | Status: DC
  Administered 2022-04-18 – 2022-04-25 (×2): 50000 [IU] via ORAL
  Filled 2022-04-18 (×2): qty 1

## 2022-04-18 NOTE — Progress Notes (Signed)
PROGRESS NOTE   Subjective/Complaints: No new complaints this morning Husband is at bedside She is working with SLP Trying to upgrade to D2 since she does not like taking medications crushed  ROS: Denies pain   Objective:   No results found.  Recent Labs    04/18/22 0555  WBC 8.3  HGB 12.2  HCT 37.3  PLT 337   Recent Labs    04/18/22 0555  NA 142  K 3.7  CL 105  CO2 27  GLUCOSE 96  BUN 18  CREATININE 0.79  CALCIUM 9.3     Intake/Output Summary (Last 24 hours) at 04/18/2022 1051 Last data filed at 04/18/2022 0839 Gross per 24 hour  Intake 420 ml  Output --  Net 420 ml         Physical Exam: Vital Signs Blood pressure (!) 165/64, pulse 65, temperature 98.4 F (36.9 C), temperature source Oral, resp. rate 16, height '5\' 4"'$  (1.626 m), weight 86.8 kg, SpO2 97 %. Vitals and nursing note reviewed. BMI 33.45 Constitutional:      Comments:  NAD, in bed, cortrak has been removed Neurological:     Mental Status: drowsy but easily awakened    Comments: Right inattention with decrease in EOMI to the right. Right facial weakness. Receptive> expressive deficits with delay in processing. + perseverative echolaly. Occasional social utterance. Able to follow simple motor commands with visual and tactile cues.     General: No acute distress Mood and affect are appropriate Heart: Regular rate and rhythm no rubs murmurs or extra sounds Lungs: CTAB, breathing unlabored, no rales or wheezes.  Abdomen: Positive bowel sounds, +TTP lower abdomen, nondistended Extremities: No clubbing, cyanosis, or edema. Moving LUE antigravity; RUE no appreciable movement in bed.  Skin: No evidence of breakdown, small area of rash left face inferior and lateral to mouth- a little improved today  PE from prior encounter:  Neurologic:  Trace finger flexin RUE o/w 0/5 3- Right HF, KE, 2- ankle PT/DF Sensory exam reduced light touch LEFT  upper and lower extremities Musculoskeletal: no pain with  range of motion in all 4 extremities. No joint swelling Ambulating MinA with RW      Assessment/Plan: 1. Functional deficits which require 3+ hours per day of interdisciplinary therapy in a comprehensive inpatient rehab setting. Physiatrist is providing close team supervision and 24 hour management of active medical problems listed below. Physiatrist and rehab team continue to assess barriers to discharge/monitor patient progress toward functional and medical goals  Care Tool:  Bathing    Body parts bathed by patient: Right arm, Chest, Abdomen, Face, Right upper leg, Left upper leg, Front perineal area, Right lower leg, Left lower leg   Body parts bathed by helper: Right lower leg, Left lower leg, Buttocks, Left arm Body parts n/a: Buttocks   Bathing assist Assist Level: Moderate Assistance - Patient 50 - 74%     Upper Body Dressing/Undressing Upper body dressing   What is the patient wearing?: Button up shirt    Upper body assist Assist Level: Moderate Assistance - Patient 50 - 74%    Lower Body Dressing/Undressing Lower body dressing      What is  the patient wearing?: Pants, Incontinence brief     Lower body assist Assist for lower body dressing: Moderate Assistance - Patient 50 - 74%     Toileting Toileting Toileting Activity did not occur (Clothing management and hygiene only): N/A (no void or bm)  Toileting assist Assist for toileting: Moderate Assistance - Patient 50 - 74%     Transfers Chair/bed transfer  Transfers assist  Chair/bed transfer activity did not occur: Safety/medical concerns  Chair/bed transfer assist level: Moderate Assistance - Patient 50 - 74%     Locomotion Ambulation   Ambulation assist      Assist level: Moderate Assistance - Patient 50 - 74% Assistive device: Hand held assist Max distance: 20   Walk 10 feet activity   Assist  Walk 10 feet activity did not occur:  Safety/medical concerns  Assist level: Moderate Assistance - Patient - 50 - 74% Assistive device: Hand held assist   Walk 50 feet activity   Assist Walk 50 feet with 2 turns activity did not occur: Safety/medical concerns         Walk 150 feet activity   Assist Walk 150 feet activity did not occur: Safety/medical concerns         Walk 10 feet on uneven surface  activity   Assist Walk 10 feet on uneven surfaces activity did not occur: Safety/medical concerns         Wheelchair     Assist Is the patient using a wheelchair?: Yes Type of Wheelchair: Manual    Wheelchair assist level: Dependent - Patient 0% Max wheelchair distance: 23f    Wheelchair 50 feet with 2 turns activity    Assist        Assist Level: Dependent - Patient 0%   Wheelchair 150 feet activity     Assist      Assist Level: Dependent - Patient 0%   Blood pressure (!) 165/64, pulse 65, temperature 98.4 F (36.9 C), temperature source Oral, resp. rate 16, height '5\' 4"'$  (1.626 m), weight 86.8 kg, SpO2 97 %.    Medical Problem List and Plan: 1. Functional deficits secondary to Left MCA infarct             -patient may  shower             -ELOS/Goals: 14-16d MinA  Start vitamin B/C supplement  Continue CIR 2.  Antithrombotics: -DVT/anticoagulation:  Mechanical: Sequential compression devices, below knee Bilateral lower extremities             -antiplatelet therapy: DAPT X 3 weeks followed by ASA alone.  3. Pain Management:  Tylenol prn.  4. Mood/Behavior/Sleep: LCSW to follow for evaluation and support.              -antipsychotic agents: N/A 5. Neuropsych/cognition: This patient is not fully capable of making decisions on her own behalf. 6. Skin/Wound Care: Routine pressure relief measures.  7. Fluids/Electrolytes/Nutrition: Monitor I/O. Check CMET in am. 8.  HTN: Monitor BP TID--continue coreg with BP goal <160.  -10/7 BP has been a little elevated, will add low dose  amlodipine 2.'5mg'$   -10/8 BP intermittently elevated, continue to monitor response amlodipine 9. Dysphagia: Discussed with patient, family, and nursing that if she can eat >50% of lunch we can d/c Cortrak today. Asking nursing to provide her with a water chart so we can ensure she is drinking 6-8 glasses of thickened water per day Diet Orders (From admission, onward)     Start  Ordered   04/18/22 0914  DIET DYS 2 Room service appropriate? Yes; Fluid consistency: Thin  Diet effective now       Comments: Please do not send green vegetables at pt's request. She does love pureed pineapple (please send as able). Please add extra sauces and gravy to each meal.  Question Answer Comment  Room service appropriate? Yes   Fluid consistency: Thin      04/18/22 0915            10. Hyperglycemia: Hgb A1C-5.5 Recent Labs    04/17/22 1637 04/17/22 2105 04/18/22 0610  GLUCAP 101* 115* 98    Decrease CBGs to TID AC  11 L-BBB s/p PPM: Brief episode of A flutter. Continue to monitor. 12. Hyperlipidemia: Continue Lipitor 13.  ABLA: Hgb down from 15.1-->12.9-->12.4-->11.7 will monitor for signs of bleeding.  Heme occult ordered  -10/7 heme occult has been negative 14. UTI: Keflex started. UC with 20,000 E coli, f/u sensitivities            - 10/4 sensitive, however with only 20k, resolving hematuria, and new abdominal pain/nausea, DC abx  15. Chest congestion: chest PT ordered. Decrease TF rate to 50/hour to avoid fluid overload 16. Obesity: BMI 32-->33.45-->32.81: provide dietary education, decrease TF rate to avoid fluid overload, appears less congested with decrease, discussed with patient and husband, daily weights ordered.  17. Hemorrhoid: witch hazel pads ordered. Discussed that softening the stool will help to prevent straining and prevent new hemorrhoids from forming.  18. Gross hematuria: continue to hold lovenox, improved.  19. Constipation: decrease senna-docusate to 1 tab with dinner.  Discussed that this will help her have a bowel movement in the morning after breakfast.  20. Small area of rash L face  -possibly minor contact dermatitis, avoid new soaps or orther possible irritants, continue to monitor for now, appears to be improving 21. Suboptimal vitamin D level: start ergocalciferol 50,000U once per weel for 7 weeks.   LOS: 12 days A FACE TO FACE EVALUATION WAS PERFORMED  Clide Deutscher Lacheryl Niesen 04/18/2022, 10:52 AM

## 2022-04-18 NOTE — Progress Notes (Signed)
Physical Therapy Session Note  Patient Details  Name: Elaine Ford MRN: [id number] Date of Birth: 15-Sep-1945  Today's Date: 04/18/2022 PT Individual Time: 1415-1530 PT Individual Time Calculation (min): 75 min   Short Term Goals: Week 2:  PT Short Term Goal 1 (Week 2): Pt will complete bed mobility with minA PT Short Term Goal 2 (Week 2): Husband will assist patient with bed<>chair transfers PT Short Term Goal 3 (Week 2): Pt will ambulate 4f with minA and LRAD PT Short Term Goal 4 (Week 2): Pt will initiate stair training  Skilled Therapeutic Interventions/Progress Updates:      Pt sitting in w/c to start - NT at bedside getting vitals. Vitals WNL. Pt in agreement to therapy session - denies pain. Husband present throughout session to assist with interpretaion as needed.  Transported in w/c to main rehab gym. Focused first part of session on introducing stair training.  Stair training using L hand rail and 3inch steps. Navigated up/down x8 steps with minA for both ascent and descent with increased difficulty during descent and mod cues needed for safely navigating with R foot leading descent.   Stair training using L hand rail and 6inch steps, navigated up/down 4 + 4 (seated rest) with minA for both directions. Similar difficulty as was with the 3inch steps. Pt hip hiking and mild circumduction RLE to compensate for weak hip/knee flexors.  Gait training with RW and min assist - ambulated 102 ft  + 1062f(!!) with gait beginning to deteriorate after ~7517fCues for improving L step length/height and lateral weight shifting to the L due to R trunk lean. Pt with decreased awareness of foot placement on L (narrow) and fatigues quickly. Pt with poor safety approach to wheelchair while turning to sit - insufficient lateral stepping resulting in narrow BOS and strong lean to her R.   Concluded session with seated Kinetron at 40cm/sec resistance. Cues for completing full ROM on L and engaging  posterior chain. Completed for 6 minutes.   Returned to her room and NT requesting patient return to bed for bladder scan. Squat<>pivot transfer completed with minA with use of bedrail. MinA for sit>supine for RLE management. Repositioned for comfort. All needs met, alarm on, husband present.   Therapy Documentation Precautions:  Precautions Precautions: Fall Precaution Comments: R hemi, apraxic Restrictions Weight Bearing Restrictions: No General:     Therapy/Group: Individual Therapy  ChrAlger Simons/03/2022, 7:38 AM

## 2022-04-18 NOTE — Progress Notes (Signed)
Occupational Therapy Weekly Progress Note  Patient Details  Name: Elaine Ford MRN: [id number] Date of Birth: November 05, 1945  Beginning of progress report period: April 07, 2022 End of progress report period: April 18, 2022  Today's Date: 04/18/2022 OT Individual Time: 6546-5035 OT Individual Time Calculation (min): 62 min    Patient has met 4 of 4 short term goals.  Patient showing improved activity tolerance, attention, awareness, problem identification, problem solving (with basic familiar tasks) , improved stand tolerance, improved stand balance, and beginning pre-functional movement in RUE.    Patient continues to demonstrate the following deficits: abnormal tone, unbalanced muscle activation, decreased coordination, and decreased motor planning, decreased visual perceptual skills and field cut, decreased midline orientation, decreased attention to right, and decreased motor planning, decreased initiation, decreased attention, decreased awareness, decreased problem solving, decreased safety awareness, and delayed processing, and decreased sitting balance, decreased standing balance, decreased postural control, hemiplegia, and decreased balance strategies and therefore will continue to benefit from skilled OT intervention to enhance overall performance with BADL, iADL, and Reduce care partner burden.  Patient progressing toward long term goals..  Continue plan of care.  OT Short Term Goals Week 1:  OT Short Term Goal 1 (Week 1): Pt will increase UB dressing and bathing to Max A while demonstrating use of hemi dressing techniques and compensatory strategies. OT Short Term Goal 1 - Progress (Week 1): Met OT Short Term Goal 2 (Week 1): Pt will increase LB bathing and dressing while utilizing AE as needed to increase independence. OT Short Term Goal 2 - Progress (Week 1): Met OT Short Term Goal 3 (Week 1): Pt will utilize her RUE as a stabilizer when completing self feed and grooming  tasks. OT Short Term Goal 3 - Progress (Week 1): Met OT Short Term Goal 4 (Week 1): Pt will increase toilet transfer to Min A using LRAD OT Short Term Goal 4 - Progress (Week 1): Met Week 2:  OT Short Term Goal 1 (Week 2): Patient will complete squat pivot transfer with contact guard assist OT Short Term Goal 2 (Week 2): Patient will bathe upper body with min assist OT Short Term Goal 3 (Week 2): Patient will dress upper body with mod assist OT Short Term Goal 4 (Week 2): Patient will bathe and dress lower body with mod assist OT Short Term Goal 5 (Week 2): Patient will complete toilet hygiene with min assist  Skilled Therapeutic Interventions/Progress Updates:    Patient received supine in bed. Patient declined shower multiple times this session.  Patient somewhat tearful, and requesting to go home.  Patient very focused on going home this session.  Patient agreeable to wash at sink, and put on clean clothing.  Patient brightened with activity.  Patient stood x 2 minutes at sink with close supervision to intermittent contact guard!  Patient attempting to use RUE frequently this session - still lacks distal component for RUE to be functional, so assisting with hand over hand wherever possible to encourage attention to this side.  Today worked on brushing hair, washing left arm, and drinking from cup with RUE.  Patient opted to stay up in wheelchair at end of session.   Patient's husband present.    Therapy Documentation Precautions:  Precautions Precautions: Fall Precaution Comments: R hemi, apraxic Restrictions Weight Bearing Restrictions: No  Pain:  Denies pain   Therapy/Group: Individual Therapy  Mariah Milling 04/18/2022, 12:29 PM

## 2022-04-18 NOTE — Progress Notes (Signed)
Speech Language Pathology Daily Session Note  Patient Details  Name: Elaine Ford MRN: [id number] Date of Birth: 1946/03/21  Today's Date: 04/18/2022 SLP Individual Time: 0830-0930 SLP Individual Time Calculation (min): 60 min  Short Term Goals: Week 2: SLP Short Term Goal 1 (Week 2): Pt will demonstrate tolerance of D1 and nectar-thick liquid consistencies with minimal s/sx concerning for aspiration given Min A cues for implementation of aspiration precautions. - SLP observed pt during med pass to determine tolerance of medications whole in puree, given pt's dislike of crushed medications in puree. Pt tolerated medications whole in puree + nectar-thick liquid via cup with no clinical s/sx concerning for airway compromise given Min A verbal cues for implementing aspiration precautions.   SLP Short Term Goal 2 (Week 2): Pt will receptively ID common objects in field of 2 (vertical presentation) with 80% accuracy given Mod A multimodal cues. - Pt receptively identified pictures of common objets in field of 2 (horizontal presentation) with ~70% accuracy given Mod A multimodal cues.   SLP Short Term Goal 3 (Week 2): Pt will respond to basic and personal yes/no questions verbally with 80% accuracy given Mod A multimodal cues. - Pt verbally responded to personal yes/no questions with ~85% accuracy given Mod A multimodal cues to include verbal cues and visual supports.  SLP Short Term Goal 4 (Week 2): Pt will demonstrate orientation x 4 via multimodal communication given binary choice prompts. - Pt demonstrated orientation x 4 given verbal, binary choice prompts.  SLP Short Term Goal 5 (Week 2): Pt will produce 2-3 word short phrases to describe pictures and/or communication functionally on 90% of opportunities given Max A multimodal cues. - Pt continues to demonstrate verbal perseveration, and is limited by this at times. Can typically be redirected with Min A verbal cues. Functionally, is beginning to  verbalize 2-3 word phrases to communicate wants, needs, and ideas on ~80% of opportunities given Min A verbal cues.   SLP Short Term Goal 6 (Week 2): Pt will participate in trials of Dysphagia 2 textures with functional oral clearance and minimal s/sx concering for airway compromise given Min A for implementation of aspiration precautions. - Pt participated in trials of Dysphagia 2 textures with functional mastication and oral clearance. No s/sx concerning for airway compromise given Mod A for implementation of aspiration precautions to include small + single bites/sips.  Skilled Therapeutic Interventions: S: Pt seen this date for skilled ST intervention targeting swallowing and communication/language goals outlined above. Pt received awake/alert and lying semi-reclined in bed. Agreeable to ST intervention at bedside; offered to help pt OOB; however, she politely declined. Participatory throughout. Spouse present during today's session, and provided translation as needed; pt verbally communicating in both [place] and [place].  O: Please see above for objective data re: pt's performance on targeted goals. SLP provided ongoing skilled pt education re: ST POC, upgrading diet textures to Dysphagia 2 textures, continuation of thickened liquids, aspiration precautions, and and ongoing need for full supervision with all PO intake; pt and pt's spouse verbalized understanding, though will continue to benefit from reinforcement.  A: Pt remains sitmulable for skilled ST intervention and will continue to benefit from these services to maximize independence and decrease caregiver burden for pending d/c home. Recommend medications be provided whole with puree, as well as diet advancement to Dysphagia 2 textures while keeping liquids nectar-thick consistency via cup at this time + full supervision for safety; pt, pt's spouse, and LPN provided with updated recommendations and verbalized understanding  P: Pt left in bed  with all safety measures activated. Call bell reviewed and within reach and all immediate needs met. Spouse remained at bedside. Continue per current ST POC next session.   Pain None/Denies  Therapy/Group: Individual Therapy  Briggs Edelen A Aurie Harroun 04/18/2022, 12:46 PM

## 2022-04-19 LAB — GLUCOSE, CAPILLARY
Glucose-Capillary: 99 mg/dL (ref 70–99)
Glucose-Capillary: 123 mg/dL — ABNORMAL HIGH (ref 70–99)
Glucose-Capillary: 135 mg/dL — ABNORMAL HIGH (ref 70–99)
Glucose-Capillary: 109 mg/dL — ABNORMAL HIGH (ref 70–99)

## 2022-04-19 MED ORDER — POTASSIUM CHLORIDE 20 MEQ PO PACK
40.0000 meq | PACK | Freq: Once | ORAL | Status: AC
  Administered 2022-04-19: 40 meq via ORAL
  Filled 2022-04-19: qty 2

## 2022-04-19 NOTE — Progress Notes (Signed)
PROGRESS NOTE   Subjective/Complaints: No new complaints this morning She did well ambulating on the stairs with PT Had 2 BM yesterday, I discussing discontinuing senna and husband is agreeable  ROS: Denies pain, constipation has improved  Objective:   No results found.  Recent Labs    04/18/22 0555  WBC 8.3  HGB 12.2  HCT 37.3  PLT 337   Recent Labs    04/18/22 0555  NA 142  K 3.7  CL 105  CO2 27  GLUCOSE 96  BUN 18  CREATININE 0.79  CALCIUM 9.3     Intake/Output Summary (Last 24 hours) at 04/19/2022 0951 Last data filed at 04/19/2022 0830 Gross per 24 hour  Intake 480 ml  Output --  Net 480 ml         Physical Exam: Vital Signs Blood pressure (!) 164/59, pulse 64, temperature 98.3 F (36.8 C), temperature source Oral, resp. rate 18, height '5\' 4"'$  (1.626 m), weight 85.7 kg, SpO2 97 %. Vitals and nursing note reviewed. BMI 33.45 Constitutional:      Comments:  NAD, in bed, cortrak has been removed Neurological:     Mental Status: drowsy but easily awakened    Comments: Right inattention with decrease in EOMI to the right. Right facial weakness. Receptive> expressive deficits with delay in processing. + perseverative echolaly. Occasional social utterance. Able to follow simple motor commands with visual and tactile cues.     General: No acute distress Mood and affect are appropriate Heart: Regular rate and rhythm no rubs murmurs or extra sounds Lungs: CTAB, breathing unlabored, no rales or wheezes.  Abdomen: Positive bowel sounds, +TTP lower abdomen, nondistended Extremities: No clubbing, cyanosis, or edema. Moving LUE antigravity; RUE no appreciable movement in bed.  Skin: No evidence of breakdown, small area of rash left face inferior and lateral to mouth- a little improved today  PE from prior encounter:  Neurologic:  Trace finger flexin RUE o/w 0/5 3- Right HF, KE, 2- ankle PT/DF Sensory  exam reduced light touch LEFT upper and lower extremities Musculoskeletal: no pain with  range of motion in all 4 extremities. No joint swelling Ambulating MinA with RW, ambulating stairs with assist      Assessment/Plan: 1. Functional deficits which require 3+ hours per day of interdisciplinary therapy in a comprehensive inpatient rehab setting. Physiatrist is providing close team supervision and 24 hour management of active medical problems listed below. Physiatrist and rehab team continue to assess barriers to discharge/monitor patient progress toward functional and medical goals  Care Tool:  Bathing    Body parts bathed by patient: Right arm, Chest, Abdomen, Face, Right upper leg, Left upper leg, Front perineal area, Right lower leg, Left lower leg   Body parts bathed by helper: Right lower leg, Left lower leg, Buttocks, Left arm Body parts n/a: Buttocks   Bathing assist Assist Level: Moderate Assistance - Patient 50 - 74%     Upper Body Dressing/Undressing Upper body dressing   What is the patient wearing?: Button up shirt    Upper body assist Assist Level: Moderate Assistance - Patient 50 - 74%    Lower Body Dressing/Undressing Lower body dressing  What is the patient wearing?: Pants, Incontinence brief     Lower body assist Assist for lower body dressing: Moderate Assistance - Patient 50 - 74%     Toileting Toileting Toileting Activity did not occur (Clothing management and hygiene only): N/A (no void or bm)  Toileting assist Assist for toileting: Moderate Assistance - Patient 50 - 74%     Transfers Chair/bed transfer  Transfers assist  Chair/bed transfer activity did not occur: Safety/medical concerns  Chair/bed transfer assist level: Moderate Assistance - Patient 50 - 74%     Locomotion Ambulation   Ambulation assist      Assist level: Moderate Assistance - Patient 50 - 74% Assistive device: Hand held assist Max distance: 20   Walk 10  feet activity   Assist  Walk 10 feet activity did not occur: Safety/medical concerns  Assist level: Moderate Assistance - Patient - 50 - 74% Assistive device: Hand held assist   Walk 50 feet activity   Assist Walk 50 feet with 2 turns activity did not occur: Safety/medical concerns         Walk 150 feet activity   Assist Walk 150 feet activity did not occur: Safety/medical concerns         Walk 10 feet on uneven surface  activity   Assist Walk 10 feet on uneven surfaces activity did not occur: Safety/medical concerns         Wheelchair     Assist Is the patient using a wheelchair?: Yes Type of Wheelchair: Manual    Wheelchair assist level: Dependent - Patient 0% Max wheelchair distance: 44f    Wheelchair 50 feet with 2 turns activity    Assist        Assist Level: Dependent - Patient 0%   Wheelchair 150 feet activity     Assist      Assist Level: Dependent - Patient 0%   Blood pressure (!) 164/59, pulse 64, temperature 98.3 F (36.8 C), temperature source Oral, resp. rate 18, height '5\' 4"'$  (1.626 m), weight 85.7 kg, SpO2 97 %.    Medical Problem List and Plan: 1. Functional deficits secondary to Left MCA infarct             -patient may  shower             -ELOS/Goals: 14-16d MinA  Start vitamin B/C supplement  Continue CIR 2.  Antithrombotics: -DVT/anticoagulation:  Mechanical: Sequential compression devices, below knee Bilateral lower extremities             -antiplatelet therapy: DAPT X 3 weeks followed by ASA alone.  3. Pain Management:  Tylenol prn.  4. Mood/Behavior/Sleep: LCSW to follow for evaluation and support.              -antipsychotic agents: N/A 5. Neuropsych/cognition: This patient is not fully capable of making decisions on her own behalf. 6. Skin/Wound Care: Routine pressure relief measures.  7. Fluids/Electrolytes/Nutrition: Monitor I/O. Check CMET in am. 8.  HTN: Monitor BP TID--continue coreg with BP goal  <160.  -10/7 BP has been a little elevated, will add low dose amlodipine 2.'5mg'$   -Add klor 257m daily 9. Dysphagia: Discussed with patient, family, and nursing that if she can eat >50% of lunch we can d/c Cortrak today. Asking nursing to provide her with a water chart so we can ensure she is drinking 6-8 glasses of thickened water per day Diet Orders (From admission, onward)     Start     Ordered  04/18/22 0914  DIET DYS 2 Room service appropriate? Yes; Fluid consistency: Thin  Diet effective now       Comments: Please do not send green vegetables at pt's request. She does love pureed pineapple (please send as able). Please add extra sauces and gravy to each meal.  Question Answer Comment  Room service appropriate? Yes   Fluid consistency: Thin      04/18/22 0915            10. Hyperglycemia: Hgb A1C-5.5 Recent Labs    04/18/22 1136 04/18/22 1643 04/19/22 0621  GLUCAP 121* 123* 99    Decrease CBGs to BID AC, discussed improved control  11 L-BBB s/p PPM: Brief episode of A flutter. Continue to monitor. 12. Hyperlipidemia: Continue Lipitor 13.  ABLA: Hgb down from 15.1-->12.9-->12.4-->11.7 will monitor for signs of bleeding.  Heme occult ordered  -10/7 heme occult has been negative 14. UTI: Keflex started. UC with 20,000 E coli, f/u sensitivities            - 10/4 sensitive, however with only 20k, resolving hematuria, and new abdominal pain/nausea, DC abx  15. Chest congestion: chest PT ordered. Decrease TF rate to 50/hour to avoid fluid overload 16. Obesity: BMI 32-->33.45-->32.81: provide dietary education, decrease TF rate to avoid fluid overload, appears less congested with decrease, discussed with patient and husband, daily weights ordered.  17. Hemorrhoid: witch hazel pads ordered. Discussed that softening the stool will help to prevent straining and prevent new hemorrhoids from forming.  18. Gross hematuria: continue to hold lovenox, improved.  19. Constipation:  discontinue senna-docusate  20. Small area of rash L face  -possibly minor contact dermatitis, avoid new soaps or orther possible irritants, continue to monitor for now, appears to be improving 21. Suboptimal vitamin D level: start ergocalciferol 50,000U once per weel for 7 weeks.  22. Suboptimal potassium: start klor 58mq daily.   LOS: 13 days A FACE TO FACE EVALUATION WAS PERFORMED  KClide DeutscherRaulkar 04/19/2022, 9:51 AM

## 2022-04-19 NOTE — Progress Notes (Signed)
Physical Therapy Session Note  Patient Details  Name: Elaine Ford MRN: [id number] Date of Birth: 19-Jul-1945  Today's Date: 04/19/2022 PT Individual Time: 1115-1155 PT Individual Time Calculation (min): 40 min   Short Term Goals: Week 2:  PT Short Term Goal 1 (Week 2): Pt will complete bed mobility with minA PT Short Term Goal 2 (Week 2): Husband will assist patient with bed<>chair transfers PT Short Term Goal 3 (Week 2): Pt will ambulate 75f with minA and LRAD PT Short Term Goal 4 (Week 2): Pt will initiate stair training  Skilled Therapeutic Interventions/Progress Updates:      Pt sitting in w/c to start - husband at bedside who was present throughout session to assist with interpretation as needed. Pt denies pain.  Transported to ortho rehab gym. Practiced car transfer and provided demonstration of technique to improve understanding and carryover. Pt completed ambulatory car transfer with minA and RW- pt able to manage LE in/out without assist with ++ time. Pt able to reposition herself in car without assist and ++ time with min cues.   Worked on increasing awareness of LUE using hand splint with RW - pt with motor apraxia and impacting her ability to understand/comprehend instructions for removing strap across back of hand. Blocked practice while sitting to limit distractions and improve understanding.  Attempted to perform sensory testing on LUE but pt unable to comprehend instructions (would try to lift arm when trying to complete sensory testing) but suspect that patient either lacks or has absent sensation from testing.  Stand<>pivot transfer with minA and no AD with face-to-face technique from w/c to Nustep. SetupA for RLE into Nustep foot pedal. At L6, completed a total of 12 minutes without rest break. Pt able to complete full ROM on R without cues needed. Used LE only to improve strengthening and isolate for reciprocal stepping. Instructed patient on holding her paretic RUE  with her LUE to improve awareness.   Pt returned to her room and remained seated in w/c with 1/2 lap tray for RUE. Husband at bedside. All needs met.   Therapy Documentation Precautions:  Precautions Precautions: Fall Precaution Comments: R hemi, apraxic Restrictions Weight Bearing Restrictions: No General:    Therapy/Group: Individual Therapy  Ashtan Girtman P Aviyanna Colbaugh 04/19/2022, 7:45 AM

## 2022-04-19 NOTE — Progress Notes (Addendum)
Speech Language Pathology Daily Session Note  Patient Details  Name: Elaine Ford MRN: [id number] Date of Birth: Mar 24, 1946  Today's Date: 04/19/2022 SLP Individual Time: 0915-1000 SLP Individual Time Calculation (min): 45 min  Short Term Goals: Week 2: SLP Short Term Goal 1 (Week 2): Pt will demonstrate tolerance of D1 and nectar-thick liquid consistencies with minimal s/sx concerning for aspiration given Min A cues for implementation of aspiration precautions. - Pt demonstrated tolerance of pureed texture and nectar-thick liquid consistencies with no clinical s/sx concerning for aspiration given Sup A for implementation of aspiration precautions.  SLP Short Term Goal 2 (Week 2): Pt will receptively ID common objects in field of 2 (vertical presentation) with 80% accuracy given Mod A multimodal cues. - Did not formally address this session.   SLP Short Term Goal 3 (Week 2): Pt will respond to basic and personal yes/no questions verbally with 80% accuracy given Mod A multimodal cues. - Pt responded to basic + personal yes/no questions re: food preferences with ~80% accuracy given Mod A multimodal cues (responses verified by spouse).  SLP Short Term Goal 4 (Week 2): Pt will demonstrate orientation x 4 via multimodal communication given binary choice prompts. - Did not formally address this session.  SLP Short Term Goal 5 (Week 2): Pt will produce 2-3 word short phrases to describe pictures and/or communication functionally on 90% of opportunities given Max A multimodal cues. - Pt produced 2-3 word, short phrases to verbalize food preferences (functional + conversational verbalizations) on ~90% of opportunities given Mod A verbal cues.  SLP Short Term Goal 6 (Week 2): Pt will participate in trials of Dysphagia 2 textures with functional oral clearance and minimal s/sx concering for airway compromise given Min A for implementation of aspiration precautions. - Pt participated in controlled trials  of Dysphagia 2 and 3 textures with functional oral manipulation and clearance and no clinical s/sx concerning for airway compromise given Min A verbal cues for implementation of aspiration precautions. No oral residuals noted post-swallow.  Skilled Therapeutic Interventions: S: Pt seen this date for skilled ST intervention targeting swallowing and communication/language goals outlined above. Pt received awake/alert and finishing toileting needs. Transferred from [facility] to w/c with use of Stedy + 2 per safety plan; continent of bowel and bladder (NT notified). Denies pain. Agreeable to ST intervention in hospital room. Pleasant and participatory throughout. Pt's spouse present throughout and translated for pt, as needed. Pt communicating in English > Spanish.   O: Please see above for objective data re: pt's performance on targeted goals. SLP provided ongoing skilled pt and family education re: ST POC, pt's current auditory comprehension and verbal expression deficits s/p CVA, and plans to trial more advanced textures next session. Pt tolerated Dysphagia 3 textures with nectar-thick liquids via cup with no clinical s/sx concerning for airway comprehension given Sup to Min A for adherence to aspiration precautions and diet modifications. Pt required Max A multimodal cues to accurately name pictures of common nouns x 4. Verbal perseveration persists, though pt demonstrate ability to verbalize more appropriate phrase when provided with SLP model prompts.  A: Pt remains sitmulable for skilled ST intervention as evident by subtle improvements in verbal communication of wants and needs, responses yes/no questions and command following. Recommend trials of  Dysphagia 3 textures, next session, to assess readiness for ongoing diet advancement.  P: Pt left in room and OOB in w/c with spouse present for supervision. Call bell reviewed and within reach and all immediate needs met.  Continue per current ST POC next  session.   Pain Pain Assessment Pain Scale: 0-10 Pain Score: 0-No pain  Therapy/Group: Individual Therapy  Davy Westmoreland A Shadman Tozzi 04/19/2022, 1:19 PM

## 2022-04-19 NOTE — Progress Notes (Signed)
Physical Therapy Session Note  Patient Details  Name: Elaine Ford MRN: [id number] Date of Birth: [birth year]  Today's Date: 04/19/2022 PT Individual Time: 0805-0900 PT Individual Time Calculation (min): 55 min   Short Term Goals: Week 1:  PT Short Term Goal 1 (Week 1): Pt will complete bed mobility with modA PT Short Term Goal 1 - Progress (Week 1): Met PT Short Term Goal 2 (Week 1): Pt will complete bed<>chair transfers with modA and LRAD PT Short Term Goal 2 - Progress (Week 1): Met PT Short Term Goal 3 (Week 1): Pt will ambulate 30f with modA and LRAD PT Short Term Goal 3 - Progress (Week 1): Not met PT Short Term Goal 4 (Week 1): Pt will initiate stair training PT Short Term Goal 4 - Progress (Week 1): Not met PT Short Term Goal 5 (Week 1): Pt will participate in functional outcome measure to assess falls risk PT Short Term Goal 5 - Progress (Week 1): Not met Week 2:  PT Short Term Goal 1 (Week 2): Pt will complete bed mobility with minA PT Short Term Goal 2 (Week 2): Husband will assist patient with bed<>chair transfers PT Short Term Goal 3 (Week 2): Pt will ambulate 573fwith minA and LRAD PT Short Term Goal 4 (Week 2): Pt will initiate stair training  Skilled Therapeutic Interventions/Progress Updates:  Patient supine in bed on entrance to room. Husband present. Patient alert and agreeable to PT session.   Patient with no pain complaint at start of session.  Therapeutic Activity: Bed Mobility: Pt performed supine --> sit to R side using bedrail with CGA. VC/ tc required for reminder to reach across with L hand to bedrail for assist. Requests assistance throughout but is able to perform with max encouragement and no physical assist. While seated EOB, pt assisted with doffing yesterday's clothes and donning new clothes for today. Setup with clothing, pt requires MaxA with threading LEs into pant legs, but pt able to pull up with Min/ ModA. Doffs top with MinA after MaxA for  buttons, and dons with Max A d/t buttons.  Transfers: Ambulatory transfer bed to w/c across room using RW with RUE saddle splint. CGA for rise to stand, MinA/ CGA to ambulate and MinA with vc for pivot stepping prior to descent to sit. Pt moves to sit early prior to fully pivoting into safe position prior to descent to sit and prior to releasing R hand from saddle spint resulting in sore wrist that pt rubs upon sitting. Soreness resolves prior to transport to gym. Pt performed sit<>stand transfers with CGA/ near supervision and stand pivot transfers throughout session with MinA . Provided verbal cues for technique and safe positioning throughout session.  Gait Training:  Pt ambulated 5512x1/ 7016x1 using RW with RUE saddle splint and CGA with increasing interrnittent minA and vc/ tc for increased R step height/ length upright posture, and proximity to walker. Demonstrated fatigue at end of longer bout.  Stair training using L hand rail and 6inch steps, navigated up/down 4 + 4 (seated rest) with minA for both directions. VC/ tc required for . Pt hip hiking and mild circumduction RLE to compensate for weak hip/knee flexors. MD present for morning rounding during stair training and very happy to see progress.   Patient seated upright in w/c at end of session with brakes locked, no alarm set as husband with pt, and all needs within reach.   Therapy Documentation Precautions:  Precautions Precautions: Fall Precaution Comments:  R hemi, apraxic Restrictions Weight Bearing Restrictions: No General:   Vital Signs:  Pain: Wrist soreness that improves with time and rest.   Therapy/Group: Individual Therapy  Alger Simons PT, DPT, CSRS 04/19/2022, 10:21 AM

## 2022-04-19 NOTE — Progress Notes (Signed)
Occupational Therapy Session Note  Patient Details  Name: Elaine Ford MRN: [id number] Date of Birth: 02/08/1946  Today's Date: 04/19/2022 OT Individual Time: 1430-1510 OT Individual Time Calculation (min): 40 min    Short Term Goals: Week 2:  OT Short Term Goal 1 (Week 2): Patient will complete squat pivot transfer with contact guard assist OT Short Term Goal 2 (Week 2): Patient will bathe upper body with min assist OT Short Term Goal 3 (Week 2): Patient will dress upper body with mod assist OT Short Term Goal 4 (Week 2): Patient will bathe and dress lower body with mod assist OT Short Term Goal 5 (Week 2): Patient will complete toilet hygiene with min assist  Skilled Therapeutic Interventions/Progress Updates:    Pt received sitting up with no c/o pain, agreeable to OT session. Husband present and supportive. She completed a stand step transfer from w/c > toilet with the RW with R hand orthosis with min A. Pt very anxious/emotionally labile during session and often crying when asked to complete a task more independently. She was able to manage clothing up/down for toileting with mod A overall. With extra time she voided urine. She completed functional mobility back to her w/c with min A using the RW but did impulsively sit. She was taken to the therapy gym via w/c. Worked on gravity eliminated RUE AROM on the high low table. She was able to demonstrate 90% range of elbow flexion and extension with gravity eliminated. As well as 75% of range of horizontal adduction and about 40 degrees of shoulder abduction. She completed functional reach forward/back with focus on scapular retraction/protraction and input provided manually. Cueing required for R attention. She returned to her room and was left supine with all needs met. Bed alarm set.    Therapy Documentation Precautions:  Precautions Precautions: Fall Precaution Comments: R hemi, apraxic Restrictions Weight Bearing Restrictions:  No  Therapy/Group: Individual Therapy  Curtis Sites 04/19/2022, 6:26 AM

## 2022-04-20 LAB — BASIC METABOLIC PANEL
Anion gap: 7 (ref 5–15)
BUN: 15 mg/dL (ref 8–23)
CO2: 24 mmol/L (ref 22–32)
Calcium: 9.1 mg/dL (ref 8.9–10.3)
Chloride: 108 mmol/L (ref 98–111)
Creatinine, Ser: 0.84 mg/dL (ref 0.44–1.00)
GFR, Estimated: >60 mL/min (ref >60)
Glucose, Bld: 123 mg/dL — ABNORMAL HIGH (ref 70–99)
Potassium: 4.2 mmol/L (ref 3.5–5.1)
Sodium: 139 mmol/L (ref 135–145)

## 2022-04-20 LAB — MAGNESIUM: Magnesium: 2.1 mg/dL (ref 1.7–2.4)

## 2022-04-20 LAB — GLUCOSE, CAPILLARY
Glucose-Capillary: 96 mg/dL (ref 70–99)
Glucose-Capillary: 116 mg/dL — ABNORMAL HIGH (ref 70–99)
Glucose-Capillary: 116 mg/dL — ABNORMAL HIGH (ref 70–99)

## 2022-04-20 MED ORDER — AMLODIPINE BESYLATE 5 MG PO TABS
5.0000 mg | ORAL_TABLET | Freq: Every day | ORAL | Status: DC
  Administered 2022-04-21 – 2022-04-27 (×7): 5 mg via ORAL
  Filled 2022-04-20 (×7): qty 1

## 2022-04-20 NOTE — Patient Care Conference (Signed)
Inpatient RehabilitationTeam Conference and Plan of Care Update Date: 04/20/2022   Time: 11:16 AM    Patient Name: Elaine Ford      Medical Record Number: [id number]  Date of Birth: [birth year] Sex: Female         Room/Bed: 4M10C/4M10C-01 Payor Info: Payor: [employer] MEDICARE / Plan: [place] HMO / Product Type: *No Product type* /    Admit Date/Time:  04/06/2022  3:10 PM  Primary Diagnosis:  Acute ischemic left middle cerebral artery (MCA) stroke [facility])  Hospital Problems: Principal Problem:   Acute ischemic left middle cerebral artery (MCA) stroke [facility])    Expected Discharge Date: Expected Discharge Date: 04/27/22  Team Members Present: Physician leading conference: Dr. Leeroy Cha Social Worker Present: Ovidio Kin, LCSW Nurse Present: Dorien Chihuahua, RN PT Present: Ginnie Smart, PT OT Present: Other (comment) Antony Salmon, OT) SLP Present: Helaine Chess, SLP PPS Coordinator present : Gunnar Fusi, SLP     Current Status/Progress Goal Weekly Team Focus  Bowel/Bladder   Continent with some incontinent episodes.  Reduce in continent episodes.  Continue timed toileting   Swallow/Nutrition/ Hydration   D3 + NTL with full supervision; Min A for implementation of aspiration precautions  Min A  MBSS to assess readiness for thin liquids   ADL's   mod assist BADL, Improved trunk control, continues with decreased awareness to right  Min assist ADL  Improve RUE use, improve attention to right, improve stand tolerance, and functional mobility   Mobility   minA bed mobility, minA stand<>pivot transfers, min to modA for ambulating 173f using RW (fatigues very quickly). MinA for navigating 4 steps using 1 hand rail. minA car transfers. Remains apraxic and aphasic. Poor activity tolerance.  CGA/minA overall  Functional transfers, gait training, stair training, initiate hands on training with husband (he has been present for all sessions).   Communication   Mod-Max A to  Max A  Min A for basic auditory comprehension and express of wants and needs via multimodal communication. Mod A for verbally expression of wants and needs and naming; will downgrade  naming, yes/no responses, command following, verbal communication, increasing MLU   Safety/Cognition/ Behavioral Observations  Max A for orientation and intellectual awareness; Sup A to Min A for sustained attention  Min A for orientation, sustained attention, and intellectual awareness  orientation, attention, and intellectual awareness   Pain   No c/o pain  Remain pain free  Assess Qshift and prn   Skin   Skin intact  Remain intact  Assess Qshift and prn     Discharge Planning:  Husband and son here daily and husband participates when appropriate, very supporitve and encouraging to pt. [facility]   Team Discussion: Patient with BP issues; MD adjusted meds.   Patient on target to meet rehab goals: yes, currently needs mod assist for ADLs, min assist for stand pivot and 100' RW, 4 steps and car transfers. Needs min assist for aspiration precautions, mod - max assist for naming, min assist to follow commands. Goals for discharge set for CGA - min assist.  *See Care Plan and progress notes for long and short-term goals.   Revisions to Treatment Plan:  Upgraded PT goals MBS 04/22/22   Teaching Needs: Safety, medications, secondary risk management, transfers, toileting, etc  Current Barriers to Discharge: Decreased caregiver support  Possible Resolutions to Barriers: Family education DME: RW, W/C HH follow up services     Medical Summary Current Status: acute left MCA  CVA, obesity, hyperglycemia, right arm weakness, hypertension  Barriers to Discharge: Medical stability  Barriers to Discharge Comments: acute left MCA CVA, obesity, hyperglycemia, right arm weakness, hypertension Possible Resolutions to [employer] Focus: continue B and D vitamins, provided extesnive dietary  education, increase amlodipine to '5mg'$ , discontinue CBG checks/discussed metformin   Continued Need for Acute Rehabilitation Level of Care: The patient requires daily medical management by a physician with specialized training in physical medicine and rehabilitation for the following reasons: Direction of a multidisciplinary physical rehabilitation program to maximize functional independence : Yes Medical management of patient stability for increased activity during participation in an intensive rehabilitation regime.: Yes Analysis of laboratory values and/or radiology reports with any subsequent need for medication adjustment and/or medical intervention. : Yes   I attest that I was present, lead the team conference, and concur with the assessment and plan of the team.   Dorien Chihuahua B 04/20/2022, 3:44 PM

## 2022-04-20 NOTE — Progress Notes (Addendum)
Speech Language Pathology Daily Session Note  Patient Details  Name: Elaine Ford MRN: [id number] Date of Birth: 07-21-1945  Today's Date: 04/20/2022 SLP Individual Time: 0730-0830 SLP Individual Time Calculation (min): 60 min  Short Term Goals: Week 2: SLP Short Term Goal 1 (Week 2): Pt will demonstrate tolerance of D1 and nectar-thick liquid consistencies with minimal s/sx concerning for aspiration given Min A cues for implementation of aspiration precautions. -GOAL MET  SLP Short Term Goal 2 (Week 2): Pt will receptively ID common objects in field of 2 (vertical presentation) with 80% accuracy given Mod A multimodal cues. - Pt receptively identified pictures of objects in field of 2 (on [employer] application) with 41% accuracy given overall Mod A verbal and gesture cues.  SLP Short Term Goal 3 (Week 2): Pt will respond to basic and personal yes/no questions verbally with 80% accuracy given Mod A multimodal cues. - Pt responded to basic and personal yes/no questions verbally with ~80% accuracy given Mod A multimodal cues to include verbal cues and visual support (high-tech yes/no AAC board).  SLP Short Term Goal 4 (Week 2): Pt will demonstrate orientation x 4 via multimodal communication given binary choice prompts. - Pt demonstrated orientation to self at the Mod I level; location with binary choice prompts; situation with Total A; Month and year with Min A given calendar to reference.  SLP Short Term Goal 5 (Week 2): Pt will produce 2-3 word short phrases to describe pictures and/or communication functionally on 90% of opportunities given Max A multimodal cues. - Pt verbally produced 2-3 word short phrases to describe pictures on ~90% of opportunities given Max A multimodal cues; communicated functionally in 2-3 word short phrases with Min A.  Pt continues to exhibit verbal perseveration's with responses within and outside of appropriate context ("so far, so good," "little by little,"),  though is redirected to produce different, more appropriate verbalization given Max A.  SLP Short Term Goal 6 (Week 2): Pt will participate in trials of Dysphagia 2 textures with functional oral clearance and minimal s/sx concering for airway compromise given Min A for implementation of aspiration precautions. - Pt participated in trial tray of Dysphagia 3 textures with functional oral manipulation and clearance and no clinical s/sx concerning for airway compromise given Min A verbal cues for implementation of aspiration precautions to include alternating solids and liquids and upright positioning.  Skilled Therapeutic Interventions: S: Pt seen this date for skilled ST intervention targeting swallowing and communication/language goals outlined above. Pt received awake/alert and lying semi-reclined in bed. Spouse present. Denies pain. Agreeable to ST intervention at bedside. Pt communicating more in English now; however, continues to produce verbal utterances in Spanish intermittently with spouse able to translate. At times, pt's utterance do not make sense.  O: Please see above for objective data re: pt's performance on targeted goals. SLP provided ongoing skilled pt and family education re: plan to target thin water trials and repeat instrumental swallow assessment in upcoming sessions, texture advancement to Dysphagia 3 with adherence to safe swallowing strategies, errorless learning principles, principles of neuroplasticity, verbal perseveration and how to increase pt's mental flexibility + variation to responses, and language application and therapeutic tasks to target auditory comprehension and verbal expression outside of therapy sessions; pt and pt's spouse verbalized understanding of all information and all questions answered to pt and pt's spouses satisfaction.  A: Pt appears sitmulable for skilled ST intervention as evident by subtle improvement in verbal responses given model prompts and visual  supports. Recommend texture advancement to Dysphagia 3, and continuation of nectar-thick liquids via cup until repeat instrumental swallow study can be completed. Recommend medications continue to be administered whole in puree.   P: Pt left in bed with all safety measures activated. Call bell reviewed and within reach and all immediate needs met. Spouse remained at the bedside. Continue per current ST POC next session.  Pain Pain Assessment Pain Scale: 0-10 Pain Score: 0-No pain  Therapy/Group: Individual Therapy  Hollie Wojahn A Angelissa Supan 04/20/2022, 11:49 AM

## 2022-04-20 NOTE — Progress Notes (Signed)
Nutrition Follow-up  DOCUMENTATION CODES:   Not applicable  INTERVENTION:   Multivitamin w/ minerals daily Encourage good PO intake   NUTRITION DIAGNOSIS:   Inadequate oral intake related to inability to eat as evidenced by NPO status. - Progressing, pt now on a diet  GOAL:   Patient will meet greater than or equal to 90% of their needs - Ongoing  MONITOR:   PO intake, Diet advancement, Labs, I & O's  REASON FOR ASSESSMENT:   Consult Enteral/tube feeding initiation and management  ASSESSMENT:   76 year old female with PMH of HTN, gestational diabetes, HLD, LBBB s/p PPM. Pt admitted on 03/28/22 with L MCA stroke. Pt received TNK and underwent cerebral angio with mechanical thrombectomy. Pt has been NPO due to severe oropharyngeal dysphagia with sensed aspiration as well as swelling of epiglottis. Cortrak in place for nutrition support. Pt admitted to CIR on 04/06/22.  10/03 - diet advanced to Dysphagia 1, Nectar Thick Liquids   10/05 - Cortrak removed 10/09 - diet advanced to Dysphagia 2, thin liquids 10/10 - diet advanced to Dysphagia 3, Nectar Thick Liquids   Pt laying in bed, husband at bedside. Reports that pt is eating better, having a little bit of a hard time with thickened liquids. Pt has expressed that she wants to go home. Pt and husband with no other questions or concerns at this time. Meal completions (10/9-10/11): 60-100% x 8 meals (average 83%)  Medications reviewed and include: Vitamin B w/ C complex, NovoLog SSI, Magnesium Gluconate, Protonix, Vitamin D Labs reviewed: 24 hr CBG 98-135  Diet Order:   Diet Order             DIET DYS 3 Room service appropriate? Yes with Assist; Fluid consistency: Nectar Thick  Diet effective 0500                   EDUCATION NEEDS:   No education needs have been identified at this time  Skin:  Skin Assessment: Reviewed RN Assessment  Last BM:  10/10  Height:   Ht Readings from Last 1 Encounters:  04/06/22 5'  4" (1.626 m)    Weight:   Wt Readings from Last 1 Encounters:  04/20/22 84.8 kg   BMI:  Body mass index is 32.09 kg/m.  Estimated Nutritional Needs:  Kcal:  5009-3818 Protein:  85-100 grams Fluid:  1.7-1.9 L   Hermina Barters RD, LDN Clinical Dietitian See [facility] for contact information.

## 2022-04-20 NOTE — Plan of Care (Signed)
Problem: RH Swallowing Goal: LTG Patient will consume least restrictive diet using compensatory strategies with assistance (SLP) Description: LTG:  Patient will consume least restrictive diet using compensatory strategies with assistance (SLP) Flowsheets (Taken 04/07/2022 2213) LTG: Pt Patient will consume least restrictive diet using compensatory strategies with assistance of (SLP): Minimal Assistance - Patient > 75% Goal: LTG Patient will participate in dysphagia therapy to increase swallow function with assistance (SLP) Description: LTG:  Patient will participate in dysphagia therapy to increase swallow function with assistance (SLP) Flowsheets (Taken 04/07/2022 2213) LTG: Pt will participate in dysphagia therapy to increase swallow function with assistance of (SLP): Minimal Assistance - Patient > 75% Goal: LTG Pt will demonstrate functional change in swallow as evidenced by bedside/clinical objective assessment (SLP) Description: LTG: Patient will demonstrate functional change in swallow as evidenced by bedside/clinical objective assessment (SLP) Flowsheets (Taken 04/07/2022 2213) LTG: Patient will demonstrate functional change in swallow as evidenced by bedside/clinical objective assessment: Oropharyngeal swallow   Problem: RH Cognition - SLP Goal: RH LTG Patient will demonstrate orientation with cues Description:  LTG:  Patient will demonstrate orientation to person/place/time/situation with cues (SLP)   Flowsheets (Taken 04/20/2022 1138) LTG Patient will demonstrate orientation to:  Place  Time  Situation LTG: Patient will demonstrate orientation using cueing (SLP): Maximal Assistance - Patient 25 - 49%   Problem: RH Comprehension Communication Goal: LTG Patient will comprehend basic/complex auditory (SLP) Description: LTG: Patient will comprehend basic/complex auditory information with cues (SLP). Flowsheets (Taken 04/07/2022 2213) LTG: Patient will comprehend: Basic auditory  information LTG: Patient will comprehend auditory information with cueing (SLP): Minimal Assistance - Patient > 75%   Problem: RH Expression Communication Goal: LTG Patient will express needs/wants via multi-modal(SLP) Description: LTG:  Patient will express needs/wants via multi-modal communication (gestures/written, etc) with cues (SLP) Flowsheets (Taken 04/07/2022 2213) LTG: Patient will express needs/wants via multimodal communication (gestures/written, etc) with cueing (SLP): Minimal Assistance - Patient > 75% Goal: LTG Patient will verbally express basic/complex needs(SLP) Description: LTG:  Patient will verbally express basic/complex needs, wants or ideas with cues  (SLP) Flowsheets (Taken 04/20/2022 1138) LTG: Patient will verbally express basic/complex needs, wants or ideas (SLP): Moderate Assistance - Patient 50 - 74% Goal: LTG Patient will increase word finding of common (SLP) Description: LTG:  Patient will increase word finding of common objects/daily info/abstract thoughts with cues using compensatory strategies (SLP). Flowsheets (Taken 04/20/2022 1138) LTG: Patient will increase word finding of common (SLP): Maximal Assistance - Patient 25 - 49% Patient will use compensatory strategies to increase word finding of: Common objects Note: Downgraded due to slower than anticipated progress   Problem: RH Attention Goal: LTG Patient will demonstrate this level of attention during functional activites (SLP) Description: LTG:  Patient will will demonstrate this level of attention during functional activites (SLP) Flowsheets (Taken 04/07/2022 2213) Patient will demonstrate during cognitive/linguistic activities the attention type of: Sustained Patient will demonstrate this level of attention during cognitive/linguistic activities in: Controlled LTG: Patient will demonstrate this level of attention during cognitive/linguistic activities with assistance of (SLP): Minimal Assistance -  Patient > 75%   Problem: RH Awareness Goal: LTG: Patient will demonstrate awareness during functional activites type of (SLP) Description: LTG: Patient will demonstrate awareness during functional activites type of (SLP) Flowsheets Taken 04/20/2022 1138 LTG: Patient will demonstrate awareness during cognitive/linguistic activities with assistance of (SLP): Maximal Assistance - Patient 25 - 49% Taken 04/07/2022 2213 Patient will demonstrate during cognitive/linguistic activities awareness type of: Intellectual Note: Downgraded due to slower than anticipated  progress

## 2022-04-20 NOTE — Progress Notes (Signed)
Physical Therapy Session Note  Patient Details  Name: Elaine Ford MRN: [id number] Date of Birth: May 27, 1946  Today's Date: 04/20/2022 PT Individual Time: 1030-1057 + 4627-0350 PT Individual Time Calculation (min): 27 min  + 58 min  Short Term Goals: Week 2:  PT Short Term Goal 1 (Week 2): Pt will complete bed mobility with minA PT Short Term Goal 2 (Week 2): Husband will assist patient with bed<>chair transfers PT Short Term Goal 3 (Week 2): Pt will ambulate 51f with minA and LRAD PT Short Term Goal 4 (Week 2): Pt will initiate stair training  Skilled Therapeutic Interventions/Progress Updates:       1st session: Pt sitting in w/c to start with her husband at the bedside - patient somewhat perseverative on her DC date and wanting to go home on Friday rather than next week. Husband appears to hope to keep original DC date. Required ++ time for redirecting.   Sit<>stand to RW with CGA/minA - assist for RUE placement into hand splint. Gait training ~846fwith minA and RW - no assist for RLE management with trunk support required at the shoulder on R to manage her lean and to assist with facilitating lateral weight shifting. Max cues for safety and R foot awareness while turning to sit into w/c. Pt able to unstrap her RUE with cues.   Husband requesting patient return to bed to rest - assisted pt via stand<>pivot transfer with minA. minA required for sit>supine for RLE management. Alarm on, all needs met.    2nd session: Pt supine in bed to start - husband at bedside. Pt agreeable to PT tx. Denies pain. Encouraged her for toileting as it has been > 3 hours.   Supine<>sitting EOB with minA for R hemibody management, pt slow to initiate. Sit<>stand to RW with minA and assist needed for RUE placement into hand splint. Ambulated with minA and RW to bathroom and modA needed for safely turning to sit and stepping backwards to toilet. TotalA needed for LB dressing. Pt continent of bladder once  on toilet - charted. Ambulated with minA and RW to w/c with assist primarily for managing her R trunk lean.   Transported to main rehab gym for time and energy conservation.   Stair training 4 steps with PT + 4 steps with Husband. Pt required reminders for safe sequencing with step-to pattern and L foot leading ascent and R foot leading descent. minA overall. Husband educated on guarding technique and cues to assist her - PT provided CGA for safety while he assisted her with stairs.   Gait training with husband assisting and PT providing minA for safety. ~3037f ~64f27feated rest). Educated on positioning for guarding and cues to assist patient with safe gait. Pt continues to lean heavily to the R with decreased awareness. Narrow BOS contributing with adduction on R. Fatigue also contributing to impairments towards end of session.  Returned her to bed with stand<>pivot transfer with minA with minA for RLE support. Husband assisting with repositioning in bed. All needs met. Alarm on.   20x16 w/c - 28inches wide (rim-to-rim). Asked husband to take home measurements for doorways  Therapy Documentation Precautions:  Precautions Precautions: Fall Precaution Comments: R hemi, apraxic Restrictions Weight Bearing Restrictions: No General:     Therapy/Group: Individual Therapy  Merrill Villarruel P Jakalyn Kratky PT 04/20/2022, 7:44 AM

## 2022-04-20 NOTE — Progress Notes (Signed)
PROGRESS NOTE   Subjective/Complaints: No new complaints this morning Her husband asks about a handicap placard- discussed we would get one for her. They have two cars. Discussed it would last 6 months  ROS: Denies pain, constipation has improved  Objective:   No results found.  Recent Labs    04/18/22 0555  WBC 8.3  HGB 12.2  HCT 37.3  PLT 337   Recent Labs    04/18/22 0555  NA 142  K 3.7  CL 105  CO2 27  GLUCOSE 96  BUN 18  CREATININE 0.79  CALCIUM 9.3     Intake/Output Summary (Last 24 hours) at 04/20/2022 1041 Last data filed at 04/20/2022 0800 Gross per 24 hour  Intake 598 ml  Output 0 ml  Net 598 ml         Physical Exam: Vital Signs Blood pressure (!) 163/53, pulse 62, temperature 98.1 F (36.7 C), temperature source Oral, resp. rate 18, height '5\' 4"'$  (1.626 m), weight 84.8 kg, SpO2 99 %. Vitals and nursing note reviewed. BMI 32.09 Constitutional:      Comments:  NAD, in bed, cortrak has been removed Neurological:     Mental Status: drowsy but easily awakened    Comments: Right inattention with decrease in EOMI to the right. Right facial weakness. Receptive> expressive deficits with delay in processing. + perseverative echolaly. Occasional social utterance. Able to follow simple motor commands with visual and tactile cues.     General: No acute distress Mood and affect are appropriate Heart: Regular rate and rhythm no rubs murmurs or extra sounds Lungs: CTAB, breathing unlabored, no rales or wheezes.  Abdomen: Positive bowel sounds, +TTP lower abdomen, nondistended Extremities: No clubbing, cyanosis, or edema. Moving LUE antigravity; RUE no appreciable movement in bed.  Skin: No evidence of breakdown, small area of rash left face inferior and lateral to mouth- a little improved today  PE from prior encounter:  Neurologic:  Trace finger flexin RUE o/w 0/5 3- Right HF, KE, 2- ankle  PT/DF Sensory exam reduced light touch LEFT upper and lower extremities Musculoskeletal: no pain with  range of motion in all 4 extremities. No joint swelling Ambulating MinA with RW, ambulating stairs with assist      Assessment/Plan: 1. Functional deficits which require 3+ hours per day of interdisciplinary therapy in a comprehensive inpatient rehab setting. Physiatrist is providing close team supervision and 24 hour management of active medical problems listed below. Physiatrist and rehab team continue to assess barriers to discharge/monitor patient progress toward functional and medical goals  Care Tool:  Bathing    Body parts bathed by patient: Right arm, Chest, Abdomen, Face, Right upper leg, Left upper leg, Front perineal area, Right lower leg, Left lower leg   Body parts bathed by helper: Right lower leg, Left lower leg, Buttocks, Left arm Body parts n/a: Buttocks   Bathing assist Assist Level: Moderate Assistance - Patient 50 - 74%     Upper Body Dressing/Undressing Upper body dressing   What is the patient wearing?: Button up shirt    Upper body assist Assist Level: Moderate Assistance - Patient 50 - 74%    Lower Body Dressing/Undressing Lower  body dressing      What is the patient wearing?: Pants, Incontinence brief     Lower body assist Assist for lower body dressing: Moderate Assistance - Patient 50 - 74%     Toileting Toileting Toileting Activity did not occur (Clothing management and hygiene only): N/A (no void or bm)  Toileting assist Assist for toileting: Moderate Assistance - Patient 50 - 74%     Transfers Chair/bed transfer  Transfers assist  Chair/bed transfer activity did not occur: Safety/medical concerns  Chair/bed transfer assist level: Moderate Assistance - Patient 50 - 74%     Locomotion Ambulation   Ambulation assist      Assist level: Moderate Assistance - Patient 50 - 74% Assistive device: Hand held assist Max distance: 20    Walk 10 feet activity   Assist  Walk 10 feet activity did not occur: Safety/medical concerns  Assist level: Moderate Assistance - Patient - 50 - 74% Assistive device: Hand held assist   Walk 50 feet activity   Assist Walk 50 feet with 2 turns activity did not occur: Safety/medical concerns         Walk 150 feet activity   Assist Walk 150 feet activity did not occur: Safety/medical concerns         Walk 10 feet on uneven surface  activity   Assist Walk 10 feet on uneven surfaces activity did not occur: Safety/medical concerns         Wheelchair     Assist Is the patient using a wheelchair?: Yes Type of Wheelchair: Manual    Wheelchair assist level: Dependent - Patient 0% Max wheelchair distance: 45f    Wheelchair 50 feet with 2 turns activity    Assist        Assist Level: Dependent - Patient 0%   Wheelchair 150 feet activity     Assist      Assist Level: Dependent - Patient 0%   Blood pressure (!) 163/53, pulse 62, temperature 98.1 F (36.7 C), temperature source Oral, resp. rate 18, height '5\' 4"'$  (1.626 m), weight 84.8 kg, SpO2 99 %.    Medical Problem List and Plan: 1. Functional deficits secondary to Left MCA infarct             -patient may  shower             -ELOS/Goals: 14-16d MinA  Start vitamin B/C supplement  Continue CIR  Discussed nutrition and exercise plan for home for stroke recovery and prevention 2.  Antithrombotics: -DVT/anticoagulation:  Mechanical: Sequential compression devices, below knee Bilateral lower extremities             -antiplatelet therapy: DAPT X 3 weeks followed by ASA alone.  3. Pain Management:  Tylenol prn.  4. Mood/Behavior/Sleep: LCSW to follow for evaluation and support.              -antipsychotic agents: N/A 5. Neuropsych/cognition: This patient is not fully capable of making decisions on her own behalf. 6. Skin/Wound Care: Routine pressure relief measures.  7.  Fluids/Electrolytes/Nutrition: Monitor I/O. Check CMET in am. 8.  HTN: Monitor BP TID--continue coreg with BP goal <160. Increase amlodipine to '5mg'$ .  9. Dysphagia: Discussed with patient, family, and nursing that if she can eat >50% of lunch we can d/c Cortrak today. Asking nursing to provide her with a water chart so we can ensure she is drinking 6-8 glasses of thickened water per day Diet Orders (From admission, onward)     Start  Ordered   04/19/22 2120  DIET DYS 3 Room service appropriate? Yes; Fluid consistency: Nectar Thick  Diet effective 0500       Comments: Add extra sauces and gravy; thank you  Question Answer Comment  Room service appropriate? Yes   Fluid consistency: Nectar Thick      04/19/22 2120            10. Hyperglycemia: Hgb A1C-5.5. Discontinue CBGs Recent Labs    04/19/22 1214 04/19/22 1643 04/20/22 0600  GLUCAP 135* 109* 96    11 L-BBB s/p PPM: Brief episode of A flutter. Continue to monitor. 12. Hyperlipidemia: Continue Lipitor 13.  ABLA: normalized, continue to monitor weekly.  14. UTI: symptoms resolved.  15. Chest congestion: chest PT ordered. Decrease TF rate to 50/hour to avoid fluid overload 16. Obesity: BMI 32-->33.45-->32.81: provide dietary education, decrease TF rate to avoid fluid overload, appears less congested with decrease, discussed with patient and husband, daily weights ordered.  17. Hemorrhoid: witch hazel pads ordered. Discussed that softening the stool will help to prevent straining and prevent new hemorrhoids from forming.  18. Gross hematuria: continue to hold lovenox, improved.  19. Constipation: discontinue senna-docusate  20. Small area of rash L face  -possibly minor contact dermatitis, avoid new soaps or orther possible irritants, continue to monitor for now, appears to be improving 21. Suboptimal vitamin D level: start ergocalciferol 50,000U once per weel for 7 weeks.  22. Suboptimal potassium: start klor 50mq daily.  Check potassium level today  LOS: 14 days A FACE TO FACE EVALUATION WAS PERFORMED  Aracelli Woloszyn P Lehi Phifer 04/20/2022, 10:41 AM

## 2022-04-20 NOTE — Progress Notes (Signed)
Patient ID: Elaine Ford, female   DOB: 09/15/1945, 76 y.o.   MRN: [id number]  Met with pt and husband who is present to update them on team conference and progress this week in her therapies. Pt continues to ask to go home but husband wants her to stay and get the full rehab. Discussed she needs to stay to continue to benefit from the intensive therapies she is getting. Discussed awaiting team's recommendations for  equipment and will set up home health they have no preference of agency. Will continue to work on discharge needs and husband will continue to to hands on care to be educated for home. 

## 2022-04-20 NOTE — Progress Notes (Signed)
Occupational Therapy Session Note  Patient Details  Name: Elaine Ford MRN: [id number] Date of Birth: Nov 28, 1945  Today's Date: 04/20/2022 OT Individual Time: 0920-1015 OT Individual Time Calculation (min): 55 min    Short Term Goals: Week 2:  OT Short Term Goal 1 (Week 2): Patient will complete squat pivot transfer with contact guard assist OT Short Term Goal 2 (Week 2): Patient will bathe upper body with min assist OT Short Term Goal 3 (Week 2): Patient will dress upper body with mod assist OT Short Term Goal 4 (Week 2): Patient will bathe and dress lower body with mod assist OT Short Term Goal 5 (Week 2): Patient will complete toilet hygiene with min assist  Skilled Therapeutic Interventions/Progress Updates:    Patient received supine in bed - agreeable to egt out of bed to use the bathroom and shower.  Patient walked with mod assist and RW to bathroom.  Attempted to toilet without BSC over toilet, so patient's feet could remain on floor.   Patient listing to right with walking, but when support reduced - patient able to self correct.  Patient attempting to use RUE throughout session!  Standing at sink to brush teeth and hair.   Transported to ADL apartment and worked on functional mobility walking around bed to make the bed.  Husband present for session.  Patient left up in wheelchair for brief break before PT.    Therapy Documentation Precautions:  Precautions Precautions: Fall Precaution Comments: R hemi, apraxic Restrictions Weight Bearing Restrictions: No  Pain: Pain Assessment Pain Scale: 0-10 Pain Score: 0-No pain    Therapy/Group: Individual Therapy  Mariah Milling 04/20/2022, 12:55 PM

## 2022-04-21 LAB — GLUCOSE, CAPILLARY: Glucose-Capillary: 104 mg/dL — ABNORMAL HIGH (ref 70–99)

## 2022-04-21 MED ORDER — SELENIUM SULFIDE 1 % EX LOTN
TOPICAL_LOTION | Freq: Every day | CUTANEOUS | Status: DC
  Filled 2022-04-21: qty 207

## 2022-04-21 MED ORDER — CETAPHIL MOISTURIZING EX LOTN
TOPICAL_LOTION | Freq: Two times a day (BID) | CUTANEOUS | Status: DC
  Filled 2022-04-21: qty 473

## 2022-04-21 NOTE — Progress Notes (Signed)
Physical Therapy Session Note  Patient Details  Name: Elaine Ford MRN: [id number] Date of Birth: 02/07/1946  Today's Date: 04/21/2022 PT Individual Time: 7829-5621 PT Individual Time Calculation (min): 30 min   Short Term Goals: Week 1:  PT Short Term Goal 1 (Week 1): Pt will complete bed mobility with modA PT Short Term Goal 1 - Progress (Week 1): Met PT Short Term Goal 2 (Week 1): Pt will complete bed<>chair transfers with modA and LRAD PT Short Term Goal 2 - Progress (Week 1): Met PT Short Term Goal 3 (Week 1): Pt will ambulate 16f with modA and LRAD PT Short Term Goal 3 - Progress (Week 1): Not met PT Short Term Goal 4 (Week 1): Pt will initiate stair training PT Short Term Goal 4 - Progress (Week 1): Not met PT Short Term Goal 5 (Week 1): Pt will participate in functional outcome measure to assess falls risk PT Short Term Goal 5 - Progress (Week 1): Not met Week 2:  PT Short Term Goal 1 (Week 2): Pt will complete bed mobility with minA PT Short Term Goal 2 (Week 2): Husband will assist patient with bed<>chair transfers PT Short Term Goal 3 (Week 2): Pt will ambulate 51fwith minA and LRAD PT Short Term Goal 4 (Week 2): Pt will initiate stair training  Skilled Therapeutic Interventions/Progress Updates:  Patient greeted supine in bed with spouse present and agreeable to PT treatment session. Hands-on family training completed throughout session in room with spouse in order to ensure a safe discharge home.   Spouse assisted patient with transition from semi-reclined to sitting EOB with MinA/ModA and used left arm to improve midline posturing- Spouse educated on assisting through R trunk and pushing down on L hip in order to better facilitate bed mobility and improve patient and spouse body mechanics.   Patient performed stand pivot transfer to/from sitting EOB to wheelchair without AD and L UE support on therapist's arm with Min/ModA secondary to fatigue- When transferring to  the R, patient required left lateral weight shift for improved R foot clearance.   Patient then performed the above transfer x3 trials with spouse- Patient and spouse worked well together and spouse provided appropriate verbal cues for transfer and to ensure safety.   Patient transitioned from sitting EOB to/from supine with Min/ModA from spouse- He demonstrated improved body mechanics during second trial of supine to sit with improved hand placement on R trunk and L hip.   Patient and spouse [facility]-ste[facility] with therapist present for safety. Patient then transitioned from sitting EOB to supine with MinA for R LE management. TotalA x2 for scooting toward HOB. Patient left supine in bed with bed alarm on, call bell within reach, spouse present and all needs met.    Therapy Documentation Precautions:  Precautions Precautions: Fall Precaution Comments: R hemi, apraxic Restrictions Weight Bearing Restrictions: No    Therapy/Group: Individual Therapy  Keylani Perlstein 04/21/2022, 3:35 PM

## 2022-04-21 NOTE — Discharge Summary (Signed)
Physician Discharge Summary  Patient ID: Elaine Ford MRN: [id number] DOB/AGE: 11-10-1945 76 y.o.  Admit date: 04/06/2022 Discharge date: 04/27/2022  Discharge Diagnoses:  Principal Problem:   Acute ischemic left middle cerebral artery (MCA) stroke (HCC) Active Problems:   Essential hypertension   Obesity   Pacemaker-St Jude   Dysphagia   Hemorrhoids   Facial rash   Hyperglycemia   Potassium deficiency   Discharged Condition: good  Significant Diagnostic Studies: DG Swallowing Func-Speech Pathology  Result Date: 04/22/2022 Table formatting from the original result was not included. Objective Swallowing Evaluation: Type of Study: MBS-Modified Barium Swallow Study  Patient Details Name: Elaine Ford MRN: [id number] Date of Birth: 09-19-1945 Today's Date: 04/22/2022 Time: SLP Start Time (ACUTE ONLY): 1040 -SLP Stop Time (ACUTE ONLY): 1107 SLP Time Calculation (min) (ACUTE ONLY): 27 min Past Medical History: Past Medical History: Diagnosis Date  Back pain   Broken arm   age 17 years old broke right arm after a fall  Gall bladder stones   Gestational diabetes   Hemorrhoid   Hx of cardiac cath   [facility] 07/24/14 at [facility] - no significant CAD  Hx of cardiovascular stress test   Myoview 11/14:  No ischemia  Hx of echocardiogram   Echo 07/23/14 - at [facility]:  EF 65-70%, impaired relaxation, mild LAE, mild MR, mild AI, mild elevated PASP (44.2 mmHg)  Hyperlipidemia   Hypertension   LBBB (left bundle branch block)   Low blood pressure   Nerve damage   right arm  Pain   Left upper chest s/p pacer maker insertion site 07/2014  S/P cardiac pacemaker procedure 07/2014  St. Jude - Dr. Ladona Ridgel  Stroke [facility])   patient was unaware noted prior to pacermaker placement unknown when it occurred Past Surgical History: Past Surgical History: Procedure Laterality Date  COLONOSCOPY    DILATATION & CURETTAGE/HYSTEROSCOPY WITH MYOSURE N/A 02/29/2016  Procedure: DILATATION & CURETTAGE/HYSTEROSCOPY WITH MYOSURE;  Surgeon: Myna Hidalgo, DO;  Location: [facility];  Service: Gynecology;  Laterality: N/A;  patient has a pacemaker   GALLBLADDER SURGERY    IR 3D INDEPENDENT WKST  03/28/2022  IR PERCUTANEOUS ART THROMBECTOMY/INFUSION INTRACRANIAL INC DIAG ANGIO  03/28/2022  IR US GUIDE VASC ACCESS RIGHT  03/28/2022  LAPAROSCOPIC BILATERAL SALPINGO OOPHERECTOMY  04/20/2016  Procedure: LAPAROSCOPIC BILATERAL SALPINGO OOPHORECTOMY;  Surgeon: Myna Hidalgo, DO;  Location: [facility];  Service: Gynecology;;  LEAD REVISION N/A 07/25/2014  Procedure: LEAD REVISION;  Surgeon: Duke Salvia, MD;  Location: [facility] CATH LAB;  Service: Cardiovascular;  Laterality: N/A;  PERMANENT PACEMAKER INSERTION N/A 07/24/2014  Procedure: PERMANENT PACEMAKER INSERTION;  Surgeon: Marinus Maw, MD;  Location: [facility] CATH LAB;  Service: Cardiovascular;  Laterality: N/A;  RADIOLOGY WITH ANESTHESIA N/A 03/28/2022  Procedure: IR WITH ANESTHESIA;  Surgeon: Radiologist, Medication, MD;  Location: [facility] OR;  Service: Radiology;  Laterality: N/A;  VAGINAL HYSTERECTOMY  04/20/2016  Procedure: HYSTERECTOMY VAGINAL;  Surgeon: Myna Hidalgo, DO;  Location: [facility];  Service: Gynecology;; HPI: 9/18 admit for CVA.  CT of the head demonstrated hypodensity in the left MCA without hemorrhage.   Systemic TNK mechanical thrombectomy.  Post procedurally on vent to ICU. Postoperative CT did demonstrate small subarachnoid hemorrhage.Extubated 9/19. No MRI at this time.  No data recorded  Recommendations for follow up therapy are one component of a multi-disciplinary discharge planning process, led by the attending physician.  Recommendations may be updated based on patient status, additional functional criteria and insurance authorization. Assessment / Plan / Recommendation  04/22/2022  11:46 AM Clinical Impressions Clinical Impression MBSS completed to assess pt's readiness for thin liquids given hx of silent aspiration on MBSS completed on 04/12/2022. Of note, pt with improved swallow function in comparison to most  recent MBSS. This date, pt presents with functional deglutition with no evidence of penetration nor aspiration across assessed consistencies (thin liquid through soft solids). Pt with slightly decreased pharyngeal peristalsis and variable swallow initiation that appeared determinate on volume; I suspect these findings were present prior to admission, and appear more consistent with presbyphagia. Given objective findings, recommend continuation of Dysphagia 3 textures and advancement of liquids to thin via cup or straw; however, pt appeared to demonstrate a more consistent swallow initiation with use of straw vs cup. Continue to recommend full supervision to ensure safety. Medications may be administered whole in puree. Findings and recommendations shared with pt, pt's spouse, and interdisciplinary team; all parties verbalized understanding and agreement.  SLP Visit Diagnosis Dysphagia, oropharyngeal phase (R13.12);Aphasia (R47.01);Cognitive communication deficit (R41.841) Impact on safety and function Mild aspiration risk     03/30/2022   2:00 PM Treatment Recommendations Treatment Recommendations Therapy as outlined in treatment plan below     04/22/2022  11:46 AM Prognosis Prognosis for Safe Diet Advancement Good Barriers to Reach Goals Cognitive deficits;Language deficits;Severity of deficits   04/22/2022  11:46 AM Diet Recommendations SLP Diet Recommendations Dysphagia 3 (Mech soft) solids;Thin liquid Liquid Administration via Straw;Cup Medication Administration Whole meds with puree Compensations Minimize environmental distractions;Slow rate;Small sips/bites Postural Changes Seated upright at 90 degrees     04/22/2022  11:46 AM Other Recommendations Oral Care Recommendations Oral care BID   03/30/2022   2:00 PM Frequency and Duration  Speech Therapy Frequency (ACUTE ONLY) min 2x/week Treatment Duration 2 weeks     04/22/2022  11:42 AM Oral Phase Oral Phase Impaired Oral - Honey Teaspoon NT Oral - Nectar Teaspoon  NT Oral - Nectar Cup Premature spillage;Decreased bolus cohesion Oral - Nectar Straw NT Oral - Thin Teaspoon NT Oral - Thin Cup Premature spillage;Decreased bolus cohesion Oral - Thin Straw Premature spillage;Decreased bolus cohesion Oral - Puree WFL Oral - Mech Soft WFL Oral - Regular NT Oral - Multi-Consistency NT Oral - Pill NT    04/22/2022  11:43 AM Pharyngeal Phase Pharyngeal Phase Impaired Pharyngeal- Honey Teaspoon NT Pharyngeal- Nectar Teaspoon NT Pharyngeal- Nectar Cup Delayed swallow initiation-vallecula;Delayed swallow initiation-pyriform sinuses;Reduced pharyngeal peristalsis;Pharyngeal residue - valleculae;Pharyngeal residue - pyriform Pharyngeal Material does not enter airway Pharyngeal- Nectar Straw NT Pharyngeal- Thin Teaspoon NT Pharyngeal- Thin Cup Delayed swallow initiation-vallecula;Reduced pharyngeal peristalsis;Pharyngeal residue - valleculae;Pharyngeal residue - pyriform Pharyngeal Material does not enter airway Pharyngeal- Thin Straw Delayed swallow initiation-vallecula;Reduced pharyngeal peristalsis;Pharyngeal residue - valleculae;Pharyngeal residue - pyriform Pharyngeal Material does not enter airway Pharyngeal- Puree WFL Pharyngeal Material does not enter airway Pharyngeal- Mechanical Soft WFL Pharyngeal Material does not enter airway Pharyngeal- Regular NT Pharyngeal- Multi-consistency NT Pharyngeal- Pill NT    04/22/2022  11:46 AM Cervical Esophageal Phase  Cervical Esophageal Phase WFL Bethany A Lutes 04/22/2022, 11:56 AM                     US RENAL  Result Date: 04/12/2022 CLINICAL DATA:  Hematuria. EXAM: RENAL / URINARY TRACT ULTRASOUND COMPLETE COMPARISON:  None Available. FINDINGS: Right Kidney: Renal measurements: 9.9 x 5.1 x 4.3 cm = volume: 112 mL. Echogenicity within normal limits. No mass or hydronephrosis visualized. Left Kidney: Renal measurements: 11.4 x 5.4 x 4.1 cm = volume: 131 mL.  1.7 cm cyst is noted in lower pole. Echogenicity within normal limits. No mass or  hydronephrosis visualized. Bladder: Appears normal for degree of bladder distention. Other: None. IMPRESSION: No significant renal abnormality is noted. Electronically Signed   By: Marijo Conception M.D.   On: 04/12/2022 15:32   DG Swallowing Func-Speech Pathology  Result Date: 04/12/2022 Table formatting from the original result was not included. Objective Swallowing Evaluation: Type of Study: MBS-Modified Barium Swallow Study  Patient Details Name: Elaine Ford MRN: [id number] Date of Birth: [birth year] Today's Date: 04/12/2022 Time: SLP Start Time (ACUTE ONLY): 13 -SLP Stop Time (ACUTE ONLY): 40 SLP Time Calculation (min) (ACUTE ONLY): 27 min Past Medical History: Past Medical History: Diagnosis Date  Back pain   Broken arm   age 41 years old broke right arm after a fall  Gall bladder stones   Gestational diabetes   Hemorrhoid   Hx of cardiac cath   [facility] 07/24/14 at [facility] - no significant CAD  Hx of cardiovascular stress test   Myoview 11/14:  No ischemia  Hx of echocardiogram   Echo 07/23/14 - at [facility]:  EF 65-70%, impaired relaxation, mild LAE, mild MR, mild AI, mild elevated PASP (44.2 mmHg)  Hyperlipidemia   Hypertension   LBBB (left bundle branch block)   Low blood pressure   Nerve damage   right arm  Pain   Left upper chest s/p pacer maker insertion site 07/2014  S/P cardiac pacemaker procedure 07/2014  St. Jude - Dr. Lovena Le  Stroke [facility])   patient was unaware noted prior to pacermaker placement unknown when it occurred Past Surgical History: Past Surgical History: Procedure Laterality Date  COLONOSCOPY    DILATATION & CURETTAGE/HYSTEROSCOPY WITH MYOSURE N/A 02/29/2016  Procedure: DILATATION & CURETTAGE/HYSTEROSCOPY WITH MYOSURE;  Surgeon: [name], DO;  Location: [place] [facility];  Service: Gynecology;  Laterality: N/A;  patient has a pacemaker   GALLBLADDER SURGERY    IR 3D INDEPENDENT WKST  03/28/2022  IR PERCUTANEOUS ART THROMBECTOMY/INFUSION INTRACRANIAL INC DIAG ANGIO  03/28/2022  IR US GUIDE VASC ACCESS RIGHT   03/28/2022  LAPAROSCOPIC BILATERAL SALPINGO OOPHERECTOMY  04/20/2016  Procedure: LAPAROSCOPIC BILATERAL SALPINGO OOPHORECTOMY;  Surgeon: [name], DO;  Location: [place] [facility];  Service: Gynecology;;  LEAD REVISION N/A 07/25/2014  Procedure: LEAD REVISION;  Surgeon: Deboraha Sprang, MD;  Location: [facility] CATH LAB;  Service: Cardiovascular;  Laterality: N/A;  PERMANENT PACEMAKER INSERTION N/A 07/24/2014  Procedure: PERMANENT PACEMAKER INSERTION;  Surgeon: [name], MD;  Location: [facility] CATH LAB;  Service: Cardiovascular;  Laterality: N/A;  RADIOLOGY WITH ANESTHESIA N/A 03/28/2022  Procedure: IR WITH ANESTHESIA;  Surgeon: Radiologist, Medication, MD;  Location: [place];  Service: Radiology;  Laterality: N/A;  VAGINAL HYSTERECTOMY  04/20/2016  Procedure: HYSTERECTOMY VAGINAL;  Surgeon: [name], DO;  Location: [place] [facility];  Service: Gynecology;; HPI: 9/18 admit for CVA.  CT of the head demonstrated hypodensity in the left MCA without hemorrhage.   Systemic TNK mechanical thrombectomy.  Post procedurally on vent to ICU. Postoperative CT did demonstrate small subarachnoid hemorrhage.Extubated 9/19. No MRI at this time.  No data recorded  Recommendations for follow up therapy are one component of a multi-disciplinary discharge planning process, led by the attending physician.  Recommendations may be updated based on patient status, additional functional criteria and insurance authorization. Assessment / Plan / Recommendation   04/12/2022   1:10 PM Clinical Impressions Clinical Impression MBSS completed to assess biomechanics of swallow function and determine readiness to initiate PO diet; currently  pt is NPO with Cortrak in place. Hospital-based Spanish interpreter present throughout. Per instrumental findings, pt presents with mild-moderate oropharyngeal dysphagia that appears to be negatively impacted by pt's cognitive status. Oral phase most notable for decreased lingual strength resulting in lingual pumping, poor oral  containment resulting in premature spillage into the pharynx prior to swallow initiation, prolonged + impaired mastication, and trace oral residuals post-swallow. Pharyngeal phase most notable for decreased laryngeal closure, variable swallow initiation points, and mistiming/incoordination of swallow apneic period and swallow initiation.  Aforementioned deficits resulted in silent cord-level penetration with resultant aspiration (PAS 8) with thin liquid via tsp. Trace-transient, shallow penetration (PAS 2) appreciated with intake of nectar-thick liquid via cup likely due to fatigue, and above cord-level + silent penetration (PAS 3) when nectar-thick liquid was provided via tsp and straw. No penetration nor aspiration appreciated with puree and Dysphagia 2 trials. SLP attempted to prompt volitional throat clear and cough with pt unable to cognitively complete task. Intermittently performed volitional secondary swallow to clear trace oropharyngeal residuals. Due to cognitive deficits, pt unable to follow through and consistently perform compensatory swallowing strategies at this time. Given instrumental findings and clinical presentation, recommend initiation of Dysphagia 1 textures with nectar-thick liquids via cup only (no straws) with adherence to strict aspiration precautions, in light of cognitive deficits, to include full supervision with all PO intake, small + single bites/sips, slow rate, upright positioning, and fully awake/alert, and no straws. SLP will continue to provide close skilled observation and monitoring of tolerance for current diet and ability to upgrade throughout this admission. Solids may be upgraded clinically; however, to upgrade liquid consistencies, recommend repeat instrumental assessment when appropriate given hx of silent penetration and aspiration. SLP Visit Diagnosis Dysphagia, oropharyngeal phase (R13.12) Impact on safety and function Moderate aspiration risk     03/30/2022   2:00 PM  Treatment Recommendations Treatment Recommendations Therapy as outlined in treatment plan below     04/12/2022   1:10 PM Prognosis Prognosis for Safe Diet Advancement Good Barriers to Reach Goals Cognitive deficits;Language deficits;Severity of deficits   04/12/2022   1:10 PM Diet Recommendations SLP Diet Recommendations Dysphagia 1 (Puree) solids;Nectar thick liquid Liquid Administration via No straw;Cup Medication Administration Crushed with puree Compensations Minimize environmental distractions;Slow rate;Small sips/bites Postural Changes Remain semi-upright after after feeds/meals (Comment);Seated upright at 90 degrees     04/12/2022   1:10 PM Other Recommendations Oral Care Recommendations Oral care BID   03/30/2022   2:00 PM Frequency and Duration  Speech Therapy Frequency (ACUTE ONLY) min 2x/week Treatment Duration 2 weeks     04/12/2022   1:05 PM Oral Phase Oral Phase Impaired Oral - Honey Teaspoon NT Oral - Nectar Teaspoon Weak lingual manipulation;Lingual pumping;Delayed oral transit;Decreased bolus cohesion;Premature spillage Oral - Nectar Cup Weak lingual manipulation;Lingual pumping;Delayed oral transit;Decreased bolus cohesion;Premature spillage;Reduced posterior propulsion Oral - Nectar Straw Delayed oral transit;Decreased bolus cohesion;Premature spillage;Lingual pumping;Weak lingual manipulation;Reduced posterior propulsion Oral - Thin Teaspoon Weak lingual manipulation;Lingual pumping;Decreased bolus cohesion;Premature spillage Oral - Thin Cup NT Oral - Thin Straw NT Oral - Puree Reduced posterior propulsion;Delayed oral transit;Decreased bolus cohesion;Premature spillage;Impaired mastication;Weak lingual manipulation;Lingual pumping Oral - Mech Soft Impaired mastication;Weak lingual manipulation;Reduced posterior propulsion;Lingual pumping;Piecemeal swallowing;Delayed oral transit;Decreased bolus cohesion;Premature spillage Oral - Regular NT Oral - Multi-Consistency NT Oral - Pill NT    04/12/2022    1:07 PM Pharyngeal Phase Pharyngeal Phase Impaired Pharyngeal- Honey Teaspoon NT Pharyngeal- Nectar Teaspoon Delayed swallow initiation-vallecula;Reduced airway/laryngeal closure Pharyngeal Material enters airway, remains ABOVE vocal cords  and not ejected out Pharyngeal- Nectar Cup Delayed swallow initiation-vallecula;Delayed swallow initiation-pyriform sinuses;Reduced airway/laryngeal closure Pharyngeal Material enters airway, remains ABOVE vocal cords then ejected out Pharyngeal- Nectar Straw Delayed swallow initiation-vallecula;Delayed swallow initiation-pyriform sinuses;Reduced airway/laryngeal closure Pharyngeal Material enters airway, remains ABOVE vocal cords and not ejected out Pharyngeal- Puree WFL Pharyngeal Material does not enter airway Pharyngeal- Mechanical Soft WFL Pharyngeal Material does not enter airway Pharyngeal- Regular NT Pharyngeal- Multi-consistency NT Pharyngeal- Pill NT    04/12/2022   1:09 PM Cervical Esophageal Phase  Cervical Esophageal Phase WFL Bethany A Lutes 04/12/2022, 1:12 PM                     VAS [place] LOWER EXTREMITY VENOUS (DVT)  Result Date: 04/07/2022  Lower Venous DVT Study Patient Name:  Elaine Ford  Date of Exam:   04/07/2022 Medical Rec #: [id number]       Accession #:    [id number] Date of Birth: 31-Jan-1946       Patient Gender: F Patient Age:   70 years Exam Location:  [facility] Procedure:      VAS [place] LOWER EXTREMITY VENOUS (DVT) Referring Phys: Tomekia Helton --------------------------------------------------------------------------------  Indications: Immobility (rehab patient).  Limitations: Body habitus, poor ultrasound/tissue interface and patient movement/agitation. Comparison Study: Previous exam on 07/15/21 was negative for DVT. Performing Technologist: Rogelia Rohrer RVT, RDMS  Examination Guidelines: A complete evaluation includes B-mode imaging, spectral Doppler, color Doppler, and power Doppler as needed of all accessible portions of each vessel. Bilateral  testing is considered an integral part of a complete examination. Limited examinations for reoccurring indications may be performed as noted. The reflux portion of the exam is performed with the patient in reverse Trendelenburg.  +---------+---------------+---------+-----------+----------+--------------+ RIGHT    CompressibilityPhasicitySpontaneityPropertiesThrombus Aging +---------+---------------+---------+-----------+----------+--------------+ CFV      Full           Yes      Yes                                 +---------+---------------+---------+-----------+----------+--------------+ SFJ      Full                                                        +---------+---------------+---------+-----------+----------+--------------+ FV Prox  Full           Yes      Yes                                 +---------+---------------+---------+-----------+----------+--------------+ FV Mid   Full           Yes      Yes                                 +---------+---------------+---------+-----------+----------+--------------+ FV DistalFull           Yes      Yes                                 +---------+---------------+---------+-----------+----------+--------------+ PFV      Full                                                        +---------+---------------+---------+-----------+----------+--------------+  POP      Full           Yes      Yes                                 +---------+---------------+---------+-----------+----------+--------------+ PTV      Full                                                        +---------+---------------+---------+-----------+----------+--------------+ PERO     Full                                                        +---------+---------------+---------+-----------+----------+--------------+   +--------+---------------+---------+-----------+----------+--------------------+ LEFT     CompressibilityPhasicitySpontaneityPropertiesThrombus Aging       +--------+---------------+---------+-----------+----------+--------------------+ CFV     Full           Yes      Yes                                       +--------+---------------+---------+-----------+----------+--------------------+ SFJ     Full                                                              +--------+---------------+---------+-----------+----------+--------------------+ FV Prox Full           Yes      Yes                                       +--------+---------------+---------+-----------+----------+--------------------+ FV Mid  Full           Yes      Yes                                       +--------+---------------+---------+-----------+----------+--------------------+ FV                     Yes      Yes                  patent by            Distal                                               color/doppler        +--------+---------------+---------+-----------+----------+--------------------+ PFV     Full                                                              +--------+---------------+---------+-----------+----------+--------------------+  POP     Full           Yes      Yes                                       +--------+---------------+---------+-----------+----------+--------------------+ PTV                                                  Not well visualized  +--------+---------------+---------+-----------+----------+--------------------+ PERO                                                 Not well visualized  +--------+---------------+---------+-----------+----------+--------------------+ LLE distal thigh through calf limited due to patient agitation and pain intolerance.    Summary: BILATERAL: -No evidence of popliteal cyst, bilaterally. RIGHT: - There is no evidence of deep vein thrombosis in the lower extremity.  LEFT: - There is no  evidence of deep vein thrombosis in the lower extremity. However, portions of this examination were limited- see technologist comments above.  *See table(s) above for measurements and observations. Electronically signed by Jamelle Haring on 04/07/2022 at 5:15:02 PM.    Final    DG Chest 2 View  Result Date: 04/07/2022 CLINICAL DATA:  Congestion, cough EXAM: CHEST - 2 VIEW COMPARISON:  03/28/2022 FINDINGS: Feeding tube coursing below the diaphragm. Bilateral mild interstitial thickening. No focal consolidation. No pleural effusion or pneumothorax. Stable cardiomegaly. Dual lead cardiac pacemaker. No acute osseous abnormality. IMPRESSION: Cardiomegaly with mild pulmonary vascular congestion. Electronically Signed   By: Kathreen Devoid M.D.   On: 04/07/2022 13:46   CT HEAD WO CONTRAST (5MM)  Result Date: 04/01/2022 CLINICAL DATA:  Stroke follow-up EXAM: CT HEAD WITHOUT CONTRAST TECHNIQUE: Contiguous axial images were obtained from the base of the skull through the vertex without intravenous contrast. RADIATION DOSE REDUCTION: This exam was performed according to the departmental dose-optimization program which includes automated exposure control, adjustment of the mA and/or kV according to patient size and/or use of iterative reconstruction technique. COMPARISON:  Three days ago FINDINGS: Brain: Progressively conspicuous large left MCA territory infarct with high-density vessels and petechial hemorrhage. Unchanged small volume subarachnoid hemorrhage along left parietal sulci. Local mass effect without significant midline shift. No new infarct. Small left cerebellar infarct which is stable. No hydrocephalus. Vascular: High-density left MCA branches at the level of infarction. Skull: Normal. Negative for fracture or focal lesion. Sinuses/Orbits: No acute finding. IMPRESSION: No progression of large left MCA territory infarct with petechial and subarachnoid hemorrhage. Electronically Signed   By: Jorje Guild M.D.    On: 04/01/2022 06:09   DG Abd Portable 1V  Result Date: 03/31/2022 CLINICAL DATA:  Feeding tube placement EXAM: PORTABLE ABDOMEN - 1 VIEW COMPARISON:  Abdomen 03/28/2022 FINDINGS: Feeding tube enters the stomach with the tip in the gastric antrum. Normal bowel gas pattern. Pacemaker noted. IMPRESSION: Feeding tube tip in the gastric antrum.  Normal bowel gas pattern. Electronically Signed   By: Franchot Gallo M.D.   On: 03/31/2022 15:04   DG Swallowing Func-Speech Pathology  Result Date: 03/31/2022 Table formatting from the original result was not included. Images from the original result  were not included. Objective Swallowing Evaluation: Type of Study: MBS-Modified Barium Swallow Study  Patient Details Name: Elaine Ford MRN: [id number] Date of Birth: 24-Feb-1946 Today's Date: 03/31/2022 Time: SLP Start Time (ACUTE ONLY): 1200 -SLP Stop Time (ACUTE ONLY): 1220 SLP Time Calculation (min) (ACUTE ONLY): 20 min Past Medical History: Past Medical History: Diagnosis Date  Back pain   Broken arm   age 57 years old broke right arm after a fall  Gall bladder stones   Gestational diabetes   Hemorrhoid   Hx of cardiac cath   [facility] 07/24/14 at [facility] - no significant CAD  Hx of cardiovascular stress test   Myoview 11/14:  No ischemia  Hx of echocardiogram   Echo 07/23/14 - at [facility]:  EF 65-70%, impaired relaxation, mild LAE, mild MR, mild AI, mild elevated PASP (44.2 mmHg)  Hyperlipidemia   Hypertension   LBBB (left bundle branch block)   Low blood pressure   Nerve damage   right arm  Pain   Left upper chest s/p pacer maker insertion site 07/2014  S/P cardiac pacemaker procedure 07/2014  [employer] - Dr. Lovena Le  Stroke [facility])   patient was unaware noted prior to pacermaker placement unknown when it occurred Past Surgical History: Past Surgical History: Procedure Laterality Date  COLONOSCOPY    DILATATION & CURETTAGE/HYSTEROSCOPY WITH MYOSURE N/A 02/29/2016  Procedure: DILATATION & CURETTAGE/HYSTEROSCOPY WITH MYOSURE;  Surgeon:  [name], DO;  Location: [facility];  Service: Gynecology;  Laterality: N/A;  patient has a pacemaker   GALLBLADDER SURGERY    IR 3D INDEPENDENT WKST  03/28/2022  IR PERCUTANEOUS ART THROMBECTOMY/INFUSION INTRACRANIAL INC DIAG ANGIO  03/28/2022  IR US GUIDE VASC ACCESS RIGHT  03/28/2022  LAPAROSCOPIC BILATERAL SALPINGO OOPHERECTOMY  04/20/2016  Procedure: LAPAROSCOPIC BILATERAL SALPINGO OOPHORECTOMY;  Surgeon: [name], DO;  Location: [facility];  Service: Gynecology;;  LEAD REVISION N/A 07/25/2014  Procedure: LEAD REVISION;  Surgeon: Deboraha Sprang, MD;  Location: [facility] CATH LAB;  Service: Cardiovascular;  Laterality: N/A;  PERMANENT PACEMAKER INSERTION N/A 07/24/2014  Procedure: PERMANENT PACEMAKER INSERTION;  Surgeon: [name], MD;  Location: [facility] CATH LAB;  Service: Cardiovascular;  Laterality: N/A;  RADIOLOGY WITH ANESTHESIA N/A 03/28/2022  Procedure: IR WITH ANESTHESIA;  Surgeon: Radiologist, Medication, MD;  Location: [place];  Service: Radiology;  Laterality: N/A;  VAGINAL HYSTERECTOMY  04/20/2016  Procedure: HYSTERECTOMY VAGINAL;  Surgeon: [name], DO;  Location: [facility];  Service: Gynecology;; HPI: 9/18 admit for CVA.  CT of the head demonstrated hypodensity in the left MCA without hemorrhage.   Systemic TNK mechanical thrombectomy.  Post procedurally on vent to ICU. Postoperative CT did demonstrate small subarachnoid hemorrhage.Extubated 9/19. No MRI at this time.  No data recorded  Recommendations for follow up therapy are one component of a multi-disciplinary discharge planning process, led by the attending physician.  Recommendations may be updated based on patient status, additional functional criteria and insurance authorization. Assessment / Plan / Recommendation   03/30/2022   2:00 PM Clinical Impressions Clinical Impression Pt demonstrates a severe oropharyngeal dysphagia with delayed sensation of aspiration post swallow with inability to eject due to decreased cough drive. Pt has laborious oral  transit with rocking behavior to achieve transit. Bolus spills to pyriform with nectar and honey teaspoon. There is an edematous appearance to epiglottis. Decreased base of tongue tension and epiglottic swelling as well as appearance of reduced hyoid excursion lead to decreased epiglottic deflection and airway protection with penetration during the swallow and aspiration of  penetrate and vallecualr residue post swallow. Pt looks like she wants to cough, face gets red, pt grimacing, but cough not elicited to eject aspiration. Puree bolus not aspirated but given delay and residue risk is high. Pt needs decreased edema and increased oropharyngeal control prior to diet initiation. Recommend NPO with Cortrak. SLP Visit Diagnosis Dysphagia, oropharyngeal phase (R13.12) Impact on safety and function Severe aspiration risk     03/30/2022   2:00 PM Treatment Recommendations Treatment Recommendations Therapy as outlined in treatment plan below      No data to display      03/30/2022   2:00 PM Diet Recommendations SLP Diet Recommendations NPO;Alternative means - temporary Medication Administration Via alternative means     03/30/2022   2:00 PM Other Recommendations Follow Up Recommendations Acute inpatient rehab (3hours/day) Assistance recommended at discharge Frequent or constant Supervision/Assistance Functional Status Assessment Patient has had a recent decline in their functional status and demonstrates the ability to make significant improvements in function in a reasonable and predictable amount of time.   03/30/2022   2:00 PM Frequency and Duration  Speech Therapy Frequency (ACUTE ONLY) min 2x/week Treatment Duration 2 weeks     03/30/2022   2:00 PM Oral Phase Oral Phase Impaired Oral - Honey Teaspoon Reduced posterior propulsion;Weak lingual manipulation;Delayed oral transit Oral - Nectar Teaspoon Reduced posterior propulsion;Weak lingual manipulation;Delayed oral transit Oral - Puree Reduced posterior propulsion;Weak  lingual manipulation;Delayed oral transit    03/30/2022   2:00 PM Pharyngeal Phase Pharyngeal Phase Impaired Pharyngeal- Honey Teaspoon Penetration/Apiration after swallow;Delayed swallow initiation-pyriform sinuses;Reduced epiglottic inversion;Reduced anterior laryngeal mobility;Reduced tongue base retraction;Moderate aspiration;Pharyngeal residue - valleculae Pharyngeal Material enters airway, passes BELOW cords and not ejected out despite cough attempt by patient;Material enters airway, passes BELOW cords without attempt by patient to eject out (silent aspiration) Pharyngeal- Nectar Teaspoon Penetration/Apiration after swallow;Delayed swallow initiation-pyriform sinuses;Reduced epiglottic inversion;Reduced anterior laryngeal mobility;Reduced tongue base retraction;Moderate aspiration;Pharyngeal residue - valleculae Pharyngeal Material enters airway, passes BELOW cords without attempt by patient to eject out (silent aspiration);Material enters airway, passes BELOW cords and not ejected out despite cough attempt by patient Pharyngeal- Puree Delayed swallow initiation-pyriform sinuses;Reduced epiglottic inversion;Reduced anterior laryngeal mobility;Reduced tongue base retraction;Pharyngeal residue - valleculae     No data to display    DeBlois, Katherene Ponto 03/31/2022, 11:42 AM                     CT HEAD WO CONTRAST (5MM)  Result Date: 03/29/2022 CLINICAL DATA:  Stroke follow-up EXAM: CT HEAD WITHOUT CONTRAST TECHNIQUE: Contiguous axial images were obtained from the base of the skull through the vertex without intravenous contrast. RADIATION DOSE REDUCTION: This exam was performed according to the departmental dose-optimization program which includes automated exposure control, adjustment of the mA and/or kV according to patient size and/or use of iterative reconstruction technique. COMPARISON:  03/28/2022 FINDINGS: Brain: Hypodensity in the left basal ganglia, insula, and anterior left temporal lobe, likely  edema related to the left MCA occlusion and infarct. Slightly increased mass effect on the left lateral ventricle compared to 03/28/2022, with overall unchanged narrowing of the left sylvian fissure. No significant midline shift. No hydrocephalus. The basal cisterns are patent. No definite hyperdensity remains in the left basal ganglia, which was likely represented contrast staining. A small amount of hyperdensity is noted in the left sylvian fissure (series 3, image 20) and in the left frontal and temporal sulci (series 3, image 21), likely subarachnoid hemorrhage; the amount of hyperdense material in this  area has decreased compared to the prior exams, also likely related to dissipating contrast staining. Vascular: No hyperdense vessel. Skull: Normal. Negative for fracture or focal lesion. Sinuses/Orbits: Bubbly fluid in the right sphenoid sinus, which may be related to intubation. The orbits are unremarkable. Other: The mastoids are well aerated. IMPRESSION: 1. Increased edema in the left MCA territory, primarily affecting the left temporal lobe and basal ganglia, with slightly increased mass effect on the left lateral ventricle but no evidence of hydrocephalus or midline shift. 2. Decreased hyperdensity in the left sylvian fissure and left cerebral sulci, likely reflecting a small amount of subarachnoid hemorrhage with dissipation of prior contrast staining. Hyperdensity in the left basal ganglia on the prior exams is no longer evident and favored to have primarily represented contrast staining. Electronically Signed   By: Merilyn Baba M.D.   On: 03/29/2022 16:02   ECHOCARDIOGRAM COMPLETE  Result Date: 03/29/2022    ECHOCARDIOGRAM REPORT   Patient Name:   Elaine Ford Date of Exam: 03/29/2022 Medical Rec #:  [id number]      Height:       64.0 in Accession #:    [id number]     Weight:       195.8 lb Date of Birth:  Jul 28, 1945      BSA:          1.939 m Patient Age:    84 years       BP:           133/51 mmHg  Patient Gender: F              HR:           62 bpm. Exam Location:  Inpatient Procedure: 2D Echo, Cardiac Doppler and Color Doppler Indications:    Stroke I63.9  History:        Patient has no prior history of Echocardiogram examinations.                 Pacemaker, Stroke, Arrythmias:LBBB; Risk Factors:Dyslipidemia                 and Hypertension.  Sonographer:    Ronny Flurry Sonographer#2:  Melissa Morford RDCS (AE, PE) Referring Phys: [place]  1. Left ventricular ejection fraction, by estimation, is 60 to 65%. The left ventricle has normal function. The left ventricle has no regional wall motion abnormalities. There is moderate left ventricular hypertrophy. Left ventricular diastolic parameters are consistent with Grade I diastolic dysfunction (impaired relaxation).  2. Right ventricular systolic function is normal. The right ventricular size is normal.  3. The mitral valve is normal in structure. Mild mitral valve regurgitation. No evidence of mitral stenosis. Moderate mitral annular calcification.  4. The aortic valve is tricuspid. Aortic valve regurgitation is mild. No aortic stenosis is present.  5. The inferior vena cava is normal in size with greater than 50% respiratory variability, suggesting right atrial pressure of 3 mmHg. Conclusion(s)/Recommendation(s): No intracardiac source of embolism detected on this transthoracic study. Consider a transesophageal echocardiogram to exclude cardiac source of embolism if clinically indicated. FINDINGS  Left Ventricle: Left ventricular ejection fraction, by estimation, is 60 to 65%. The left ventricle has normal function. The left ventricle has no regional wall motion abnormalities. The left ventricular internal cavity size was normal in size. There is  moderate left ventricular hypertrophy. Abnormal (paradoxical) septal motion, consistent with left bundle branch block. Left ventricular diastolic parameters are consistent with Grade I  diastolic dysfunction (impaired  relaxation). Right Ventricle: The right ventricular size is normal. No increase in right ventricular wall thickness. Right ventricular systolic function is normal. Left Atrium: Left atrial size was normal in size. Right Atrium: Right atrial size was normal in size. Pericardium: There is no evidence of pericardial effusion. Mitral Valve: The mitral valve is normal in structure. Moderate mitral annular calcification. Mild mitral valve regurgitation. No evidence of mitral valve stenosis. Tricuspid Valve: The tricuspid valve is normal in structure. Tricuspid valve regurgitation is mild . No evidence of tricuspid stenosis. Aortic Valve: The aortic valve is tricuspid. Aortic valve regurgitation is mild. Aortic regurgitation PHT measures 457 msec. No aortic stenosis is present. Pulmonic Valve: The pulmonic valve was normal in structure. Pulmonic valve regurgitation is not visualized. No evidence of pulmonic stenosis. Aorta: The aortic root is normal in size and structure. Venous: The inferior vena cava is normal in size with greater than 50% respiratory variability, suggesting right atrial pressure of 3 mmHg. IAS/Shunts: No atrial level shunt detected by color flow Doppler.  LEFT VENTRICLE PLAX 2D LVIDd:         3.55 cm LV PW:         1.35 cm LV IVS:        1.00 cm LVOT diam:     1.80 cm LV SV:         82 LV SV Index:   42 LVOT Area:     2.54 cm  RIGHT VENTRICLE RV S prime:     12.90 cm/s TAPSE (M-mode): 2.2 cm LEFT ATRIUM           Index        RIGHT ATRIUM           Index LA diam:      3.10 cm 1.60 cm/m   RA Area:     13.10 cm LA Vol (A2C): 33.2 ml 17.13 ml/m  RA Volume:   28.10 ml  14.49 ml/m LA Vol (A4C): 45.4 ml 23.42 ml/m  AORTIC VALVE LVOT Vmax:   164.00 cm/s LVOT Vmean:  112.000 cm/s LVOT VTI:    0.321 m AI PHT:      457 msec  AORTA Ao Root diam: 2.90 cm Ao Asc diam:  3.30 cm MITRAL VALVE                TRICUSPID VALVE MV Area (PHT): 2.71 cm     TR Peak grad:   23.6 mmHg MV  Decel Time: 280 msec     TR Vmax:        243.00 cm/s MV E velocity: 108.00 cm/s MV A velocity: 147.00 cm/s  SHUNTS MV E/A ratio:  0.73         Systemic VTI:  0.32 m                             Systemic Diam: 1.80 cm Candee Furbish MD Electronically signed by Candee Furbish MD Signature Date/Time: 03/29/2022/1:01:17 PM    Final    IR PERCUTANEOUS ART THROMBECTOMY/INFUSION INTRACRANIAL INC DIAG ANGIO  Result Date: 03/29/2022 INDICATION: 76 year old female with past medical history significant for gallstones, gestational diabetes, hyperlipidemia, hypertension, left bundle branch block status post pacemaker placement and hypotension. She presented to of 80 with acute onset of right-sided hemiplegia and aphasia; NIHSS 23. Her last known well was 10 a.m. on 03/28/2022. Head CT showed no large acute territorial infarct or hemorrhage. Hyperdense left M1-M2 was noted. CT angiogram  of the head and neck showed an occlusion of the left M1/MCA with poor collaterals. She received IV TNK and was then transferred to our service for a mechanical thrombectomy. EXAM: ULTRASOUND-GUIDED VASCULAR ACCESS DIAGNOSTIC CEREBRAL ANGIOGRAM MECHANICAL THROMBECTOMY FLAT PANEL HEAD CT COMPARISON:  CT/CT angiogram of the head and neck March 28, 2022. MEDICATIONS: No antibiotic was administered. ANESTHESIA/SEDATION: The procedure was performed under general anesthesia. CONTRAST:  150 mL of Omnipaque 300 milligram/mL FLUOROSCOPY: Radiation Exposure Index (as provided by the fluoroscopic device): 5,188 mGy Kerma COMPLICATIONS: SIR Level A - No therapy, no consequence. TECHNIQUE: Informed written consent was obtained from the patient's husband after a thorough discussion of the procedural risks, benefits and alternatives. All questions were addressed. Maximal Sterile Barrier Technique was utilized including caps, mask, sterile gowns, sterile gloves, sterile drape, hand hygiene and skin antiseptic. A timeout was performed prior to the initiation of the  procedure. The right groin was prepped and draped in the usual sterile fashion. Using a micropuncture kit and the modified Seldinger technique, access was gained to the right common femoral artery and an 8 French sheath was placed. Real-time ultrasound guidance was utilized for vascular access including the acquisition of a permanent ultrasound image documenting patency of the accessed vessel. Under fluoroscopy, a Zoom 88 guide catheter was navigated over a 6 [place] VTK catheter and a 0.035" Terumo Glidewire into the aortic arch. The catheter was placed into the left common carotid artery and then advanced into the left internal carotid artery. The diagnostic catheter was removed. Left anterior oblique angiograms of the head was obtained. FINDINGS: 1. Normal caliber of the right common femoral artery, adequate for vascular access. 2. Increase tortuosity of the upper cervical segment of the left ICA. 3. Occlusion of the proximal left M2/MCA. PROCEDURE: Using roadmap guidance, a Zoom 55 aspiration catheter was navigated over Colossus 35 microguidewire into the cavernous segment of the left ICA. The aspiration catheter was then advanced to the level of occlusion in the M1 segment and connected to an aspiration pump. Continuous aspiration was performed for 2 minutes. The guide catheter was connected to a VacLok syringe. The aspiration catheter was subsequently removed under constant aspiration. The guide catheter was aspirated for debris. Left internal carotid artery angiograms with left anterior oblique views of the head were obtained. Persistent occlusion of the left M1 segment was seen. Using roadmap guidance, a zoom 55 aspiration catheter was navigated over a phenom 21 microcatheter and a Aristotle 14 microguidewire into the cavernous segment of the left ICA. The microcatheter was then navigated over the wire into the left M2/MCA superior division branch. Then, a 4 x 40 mm solitaire stent retriever was deployed  spanning the M1-M2 segments. The device was allowed to intercalated with the clot for 4 minutes. The microcatheter was removed. The aspiration catheter was advanced to the level of occlusion and connected to an aspiration pump. The guiding catheter balloon was inflated. The thrombectomy device and aspiration catheter were removed under constant aspiration. Left internal carotid artery angiograms with left anterior oblique view showed recanalization of the superior division branch. Using roadmap guidance, a zoom 55 aspiration catheter was navigated over a phenom 21 microcatheter and a Aristotle 14 microguidewire into the cavernous segment of the left ICA. The microcatheter was then navigated over the wire into the left M2/MCA superior division branch. Frontal and lateral angiograms were obtained via microcatheter contrast injection. The catheter was retracted into the M1 segment. Frontal and lateral angiograms were obtained via aspiration catheter contrast  injection. Recanalization of the second superior division branch was noted. Using roadmap guidance, a 5 [place] Esperance aspiration catheter was navigated over a Prowler EX microcatheter and a synchro 2 microguidewire into the cavernous segment of the left ICA. The microcatheter was then navigated over the wire into the left M2/MCA inferior division branch. Then, a 3 x 40 mm solitaire stent retriever was deployed spanning the M2 segment. The device was allowed to intercalated with the clot for 4 minutes. The microcatheter was removed. The aspiration catheter was advanced to the level of occlusion and connected to an aspiration pump. The thrombectomy device and aspiration catheter were removed under constant aspiration. Left internal carotid or else with left anterior oblique view showed recanalization of the proximal aspect of the left M2/MCA inferior division branch with persistent occlusion distal to its origin. Using roadmap guidance, a 5 [place] Esperance  aspiration catheter was navigated over a Prowler EX microcatheter and a synchro 2 microguidewire into the cavernous segment of the left ICA. The microcatheter was then navigated over the wire into the left M2/MCA inferior division branch. Then, a 3 mm Trevo stent retriever was deployed spanning the M2 segment. The device was allowed to intercalated with the clot for 4 minutes. The microcatheter was removed. The aspiration catheter was advanced to the level of occlusion and connected to an aspiration pump. The thrombectomy device and aspiration catheter were removed under constant aspiration. Left internal carotid artery angiogram with left anterior oblique and lateral views of the head showed recanalization of the inferior division (TICI 3). Luminal irregularity along the proximal M2/MCA inferior division branch with moderate stenosis is noted. Mild luminal irregularities are noted along the left MCA vascular tree, suggestive of intracranial atherosclerotic disease. Flat panel CT of the head was obtained and post processed in a separate workstation with concurrent attending physician supervision. Selected images were sent to PACS. Suboptimal quality of the CT due to motion. Left sylvian fissure hyperdensity noted, may represent subarachnoid hemorrhage versus contrast extravasation. The catheter was subsequently withdrawn. Right common femoral artery angiogram was obtained in right anterior oblique view. The puncture is at the level of the common femoral artery. The artery has normal caliber, adequate for closure device. The sheath was exchanged over the wire for a Perclose prostyle which was utilized for access closure. Immediate hemostasis was achieved. IMPRESSION: 1. Successful mechanical thrombectomy for treatment of a left M1/MCA occlusion achieving complete recanalization. Underlying atherosclerotic disease noted with moderate stenosis of the left MCA inferior division branch. 2. Hyperdensity within the left  CCA and fissure may represent contrast staining versus subarachnoid hemorrhage. PLAN: 1. Head CT will be obtained to better evaluate left sylvian fissure hyperdensity. 2. Transfer to ICU for continued post stroke care. Electronically Signed   By: Pedro Earls M.D.   On: 03/29/2022 12:39   IR US Guide Vasc Access Right  Result Date: 03/29/2022 INDICATION: 76 year old female with past medical history significant for gallstones, gestational diabetes, hyperlipidemia, hypertension, left bundle branch block status post pacemaker placement and hypotension. She presented to of [facility] with acute onset of right-sided hemiplegia and aphasia; NIHSS 23. Her last known well was 10 a.m. on 03/28/2022. Head CT showed no large acute territorial infarct or hemorrhage. Hyperdense left M1-M2 was noted. CT angiogram of the head and neck showed an occlusion of the left M1/MCA with poor collaterals. She received IV TNK and was then transferred to our service for a mechanical thrombectomy. EXAM: ULTRASOUND-GUIDED VASCULAR ACCESS DIAGNOSTIC CEREBRAL ANGIOGRAM MECHANICAL THROMBECTOMY  FLAT PANEL HEAD CT COMPARISON:  CT/CT angiogram of the head and neck March 28, 2022. MEDICATIONS: No antibiotic was administered. ANESTHESIA/SEDATION: The procedure was performed under general anesthesia. CONTRAST:  150 mL of Omnipaque 300 milligram/mL FLUOROSCOPY: Radiation Exposure Index (as provided by the fluoroscopic device): 9,563 mGy Kerma COMPLICATIONS: SIR Level A - No therapy, no consequence. TECHNIQUE: Informed written consent was obtained from the patient's husband after a thorough discussion of the procedural risks, benefits and alternatives. All questions were addressed. Maximal Sterile Barrier Technique was utilized including caps, mask, sterile gowns, sterile gloves, sterile drape, hand hygiene and skin antiseptic. A timeout was performed prior to the initiation of the procedure. The right groin was prepped and draped in the  usual sterile fashion. Using a micropuncture kit and the modified Seldinger technique, access was gained to the right common femoral artery and an 8 French sheath was placed. Real-time ultrasound guidance was utilized for vascular access including the acquisition of a permanent ultrasound image documenting patency of the accessed vessel. Under fluoroscopy, a Zoom 88 guide catheter was navigated over a 6 [place] VTK catheter and a 0.035" Terumo Glidewire into the aortic arch. The catheter was placed into the left common carotid artery and then advanced into the left internal carotid artery. The diagnostic catheter was removed. Left anterior oblique angiograms of the head was obtained. FINDINGS: 1. Normal caliber of the right common femoral artery, adequate for vascular access. 2. Increase tortuosity of the upper cervical segment of the left ICA. 3. Occlusion of the proximal left M2/MCA. PROCEDURE: Using roadmap guidance, a Zoom 55 aspiration catheter was navigated over Colossus 35 microguidewire into the cavernous segment of the left ICA. The aspiration catheter was then advanced to the level of occlusion in the M1 segment and connected to an aspiration pump. Continuous aspiration was performed for 2 minutes. The guide catheter was connected to a VacLok syringe. The aspiration catheter was subsequently removed under constant aspiration. The guide catheter was aspirated for debris. Left internal carotid artery angiograms with left anterior oblique views of the head were obtained. Persistent occlusion of the left M1 segment was seen. Using roadmap guidance, a zoom 55 aspiration catheter was navigated over a phenom 21 microcatheter and a Aristotle 14 microguidewire into the cavernous segment of the left ICA. The microcatheter was then navigated over the wire into the left M2/MCA superior division branch. Then, a 4 x 40 mm solitaire stent retriever was deployed spanning the M1-M2 segments. The device was allowed to  intercalated with the clot for 4 minutes. The microcatheter was removed. The aspiration catheter was advanced to the level of occlusion and connected to an aspiration pump. The guiding catheter balloon was inflated. The thrombectomy device and aspiration catheter were removed under constant aspiration. Left internal carotid artery angiograms with left anterior oblique view showed recanalization of the superior division branch. Using roadmap guidance, a zoom 55 aspiration catheter was navigated over a phenom 21 microcatheter and a Aristotle 14 microguidewire into the cavernous segment of the left ICA. The microcatheter was then navigated over the wire into the left M2/MCA superior division branch. Frontal and lateral angiograms were obtained via microcatheter contrast injection. The catheter was retracted into the M1 segment. Frontal and lateral angiograms were obtained via aspiration catheter contrast injection. Recanalization of the second superior division branch was noted. Using roadmap guidance, a 5 [place] Esperance aspiration catheter was navigated over a Prowler EX microcatheter and a synchro 2 microguidewire into the cavernous segment of the left ICA.  The microcatheter was then navigated over the wire into the left M2/MCA inferior division branch. Then, a 3 x 40 mm solitaire stent retriever was deployed spanning the M2 segment. The device was allowed to intercalated with the clot for 4 minutes. The microcatheter was removed. The aspiration catheter was advanced to the level of occlusion and connected to an aspiration pump. The thrombectomy device and aspiration catheter were removed under constant aspiration. Left internal carotid or else with left anterior oblique view showed recanalization of the proximal aspect of the left M2/MCA inferior division branch with persistent occlusion distal to its origin. Using roadmap guidance, a 5 [place] Esperance aspiration catheter was navigated over a Prowler EX  microcatheter and a synchro 2 microguidewire into the cavernous segment of the left ICA. The microcatheter was then navigated over the wire into the left M2/MCA inferior division branch. Then, a 3 mm Trevo stent retriever was deployed spanning the M2 segment. The device was allowed to intercalated with the clot for 4 minutes. The microcatheter was removed. The aspiration catheter was advanced to the level of occlusion and connected to an aspiration pump. The thrombectomy device and aspiration catheter were removed under constant aspiration. Left internal carotid artery angiogram with left anterior oblique and lateral views of the head showed recanalization of the inferior division (TICI 3). Luminal irregularity along the proximal M2/MCA inferior division branch with moderate stenosis is noted. Mild luminal irregularities are noted along the left MCA vascular tree, suggestive of intracranial atherosclerotic disease. Flat panel CT of the head was obtained and post processed in a separate workstation with concurrent attending physician supervision. Selected images were sent to PACS. Suboptimal quality of the CT due to motion. Left sylvian fissure hyperdensity noted, may represent subarachnoid hemorrhage versus contrast extravasation. The catheter was subsequently withdrawn. Right common femoral artery angiogram was obtained in right anterior oblique view. The puncture is at the level of the common femoral artery. The artery has normal caliber, adequate for closure device. The sheath was exchanged over the wire for a Perclose prostyle which was utilized for access closure. Immediate hemostasis was achieved. IMPRESSION: 1. Successful mechanical thrombectomy for treatment of a left M1/MCA occlusion achieving complete recanalization. Underlying atherosclerotic disease noted with moderate stenosis of the left MCA inferior division branch. 2. Hyperdensity within the left CCA and fissure may represent contrast staining versus  subarachnoid hemorrhage. PLAN: 1. Head CT will be obtained to better evaluate left sylvian fissure hyperdensity. 2. Transfer to ICU for continued post stroke care. Electronically Signed   By: Pedro Earls M.D.   On: 03/29/2022 12:39   IR 3D Independent Darreld Mclean  Result Date: 03/29/2022 INDICATION: 76 year old female with past medical history significant for gallstones, gestational diabetes, hyperlipidemia, hypertension, left bundle branch block status post pacemaker placement and hypotension. She presented to of 80 with acute onset of right-sided hemiplegia and aphasia; NIHSS 23. Her last known well was 10 a.m. on 03/28/2022. Head CT showed no large acute territorial infarct or hemorrhage. Hyperdense left M1-M2 was noted. CT angiogram of the head and neck showed an occlusion of the left M1/MCA with poor collaterals. She received IV TNK and was then transferred to our service for a mechanical thrombectomy. EXAM: ULTRASOUND-GUIDED VASCULAR ACCESS DIAGNOSTIC CEREBRAL ANGIOGRAM MECHANICAL THROMBECTOMY FLAT PANEL HEAD CT COMPARISON:  CT/CT angiogram of the head and neck March 28, 2022. MEDICATIONS: No antibiotic was administered. ANESTHESIA/SEDATION: The procedure was performed under general anesthesia. CONTRAST:  150 mL of Omnipaque 300 milligram/mL FLUOROSCOPY: Radiation Exposure Index (  as provided by the fluoroscopic device): 4,196 mGy Kerma COMPLICATIONS: SIR Level A - No therapy, no consequence. TECHNIQUE: Informed written consent was obtained from the patient's husband after a thorough discussion of the procedural risks, benefits and alternatives. All questions were addressed. Maximal Sterile Barrier Technique was utilized including caps, mask, sterile gowns, sterile gloves, sterile drape, hand hygiene and skin antiseptic. A timeout was performed prior to the initiation of the procedure. The right groin was prepped and draped in the usual sterile fashion. Using a micropuncture kit and the  modified Seldinger technique, access was gained to the right common femoral artery and an 8 French sheath was placed. Real-time ultrasound guidance was utilized for vascular access including the acquisition of a permanent ultrasound image documenting patency of the accessed vessel. Under fluoroscopy, a Zoom 88 guide catheter was navigated over a 6 [place] VTK catheter and a 0.035" Terumo Glidewire into the aortic arch. The catheter was placed into the left common carotid artery and then advanced into the left internal carotid artery. The diagnostic catheter was removed. Left anterior oblique angiograms of the head was obtained. FINDINGS: 1. Normal caliber of the right common femoral artery, adequate for vascular access. 2. Increase tortuosity of the upper cervical segment of the left ICA. 3. Occlusion of the proximal left M2/MCA. PROCEDURE: Using roadmap guidance, a Zoom 55 aspiration catheter was navigated over Colossus 35 microguidewire into the cavernous segment of the left ICA. The aspiration catheter was then advanced to the level of occlusion in the M1 segment and connected to an aspiration pump. Continuous aspiration was performed for 2 minutes. The guide catheter was connected to a VacLok syringe. The aspiration catheter was subsequently removed under constant aspiration. The guide catheter was aspirated for debris. Left internal carotid artery angiograms with left anterior oblique views of the head were obtained. Persistent occlusion of the left M1 segment was seen. Using roadmap guidance, a zoom 55 aspiration catheter was navigated over a phenom 21 microcatheter and a Aristotle 14 microguidewire into the cavernous segment of the left ICA. The microcatheter was then navigated over the wire into the left M2/MCA superior division branch. Then, a 4 x 40 mm solitaire stent retriever was deployed spanning the M1-M2 segments. The device was allowed to intercalated with the clot for 4 minutes. The microcatheter was  removed. The aspiration catheter was advanced to the level of occlusion and connected to an aspiration pump. The guiding catheter balloon was inflated. The thrombectomy device and aspiration catheter were removed under constant aspiration. Left internal carotid artery angiograms with left anterior oblique view showed recanalization of the superior division branch. Using roadmap guidance, a zoom 55 aspiration catheter was navigated over a phenom 21 microcatheter and a Aristotle 14 microguidewire into the cavernous segment of the left ICA. The microcatheter was then navigated over the wire into the left M2/MCA superior division branch. Frontal and lateral angiograms were obtained via microcatheter contrast injection. The catheter was retracted into the M1 segment. Frontal and lateral angiograms were obtained via aspiration catheter contrast injection. Recanalization of the second superior division branch was noted. Using roadmap guidance, a 5 [place] Esperance aspiration catheter was navigated over a Prowler EX microcatheter and a synchro 2 microguidewire into the cavernous segment of the left ICA. The microcatheter was then navigated over the wire into the left M2/MCA inferior division branch. Then, a 3 x 40 mm solitaire stent retriever was deployed spanning the M2 segment. The device was allowed to intercalated with the clot for 4  minutes. The microcatheter was removed. The aspiration catheter was advanced to the level of occlusion and connected to an aspiration pump. The thrombectomy device and aspiration catheter were removed under constant aspiration. Left internal carotid or else with left anterior oblique view showed recanalization of the proximal aspect of the left M2/MCA inferior division branch with persistent occlusion distal to its origin. Using roadmap guidance, a 5 [place] Esperance aspiration catheter was navigated over a Prowler EX microcatheter and a synchro 2 microguidewire into the cavernous segment  of the left ICA. The microcatheter was then navigated over the wire into the left M2/MCA inferior division branch. Then, a 3 mm Trevo stent retriever was deployed spanning the M2 segment. The device was allowed to intercalated with the clot for 4 minutes. The microcatheter was removed. The aspiration catheter was advanced to the level of occlusion and connected to an aspiration pump. The thrombectomy device and aspiration catheter were removed under constant aspiration. Left internal carotid artery angiogram with left anterior oblique and lateral views of the head showed recanalization of the inferior division (TICI 3). Luminal irregularity along the proximal M2/MCA inferior division branch with moderate stenosis is noted. Mild luminal irregularities are noted along the left MCA vascular tree, suggestive of intracranial atherosclerotic disease. Flat panel CT of the head was obtained and post processed in a separate workstation with concurrent attending physician supervision. Selected images were sent to PACS. Suboptimal quality of the CT due to motion. Left sylvian fissure hyperdensity noted, may represent subarachnoid hemorrhage versus contrast extravasation. The catheter was subsequently withdrawn. Right common femoral artery angiogram was obtained in right anterior oblique view. The puncture is at the level of the common femoral artery. The artery has normal caliber, adequate for closure device. The sheath was exchanged over the wire for a Perclose prostyle which was utilized for access closure. Immediate hemostasis was achieved. IMPRESSION: 1. Successful mechanical thrombectomy for treatment of a left M1/MCA occlusion achieving complete recanalization. Underlying atherosclerotic disease noted with moderate stenosis of the left MCA inferior division branch. 2. Hyperdensity within the left CCA and fissure may represent contrast staining versus subarachnoid hemorrhage. PLAN: 1. Head CT will be obtained to better  evaluate left sylvian fissure hyperdensity. 2. Transfer to ICU for continued post stroke care. Electronically Signed   By: Pedro Earls M.D.   On: 03/29/2022 12:39   DG Abd 1 View  Result Date: 03/28/2022 CLINICAL DATA:  Check gastric catheter placement EXAM: ABDOMEN - 1 VIEW COMPARISON:  None Available. FINDINGS: Gastric catheter is noted coiled within the stomach. Contrast is noted within the kidneys from prior arteriogram. No free air is seen. IMPRESSION: Gastric catheter within the stomach. Electronically Signed   By: Inez Catalina M.D.   On: 03/28/2022 21:57   CT HEAD WO CONTRAST (5MM)  Result Date: 03/28/2022 CLINICAL DATA:  Assess stability of hemorrhage EXAM: CT HEAD WITHOUT CONTRAST TECHNIQUE: Contiguous axial images were obtained from the base of the skull through the vertex without intravenous contrast. RADIATION DOSE REDUCTION: This exam was performed according to the departmental dose-optimization program which includes automated exposure control, adjustment of the mA and/or kV according to patient size and/or use of iterative reconstruction technique. COMPARISON:  03/28/2022 4:20 p.m. FINDINGS: Brain: Redemonstrated hyperdensity involving the left basal ganglia, which is slightly decreased in conspicuity and favored to represent contrast staining rather than hemorrhage. Additional subarachnoid hyperdensity along the left cerebral hemisphere, particularly in the left sylvian fissure, is also decreased in conspicuity and may represent a  combination of extravasated contrast and subarachnoid hemorrhage. No definite acute infarct, mass, or midline shift. Sulcal effacement in the left cerebral hemisphere, particularly the sylvian fissure, with increased prominence of the caudate compared to the earliest same day CT, likely reflecting edema and mild mass effect. Mild narrowing of the left lateral ventricle frontal horn. No hydrocephalus. Vascular: Contrast remains in the vascular  system. No definite hyperdense vessel. Skull: Normal. Negative for fracture or focal lesion. Sinuses/Orbits: No acute finding. Other: The mastoids are well aerated. IMPRESSION: 1. Hyperdensity in the left basal ganglia is less conspicuous on this exam, favoring contrast staining rather than hemorrhage. 2. Subarachnoid hyperdensity along the left cerebral hemisphere is also slightly decreased in conspicuity and may represent a combination of subarachnoid hemorrhage and extravasated contrast. 3. Sulcal effacement and local mass effect in the left cerebral hemisphere, most likely edema, without midline shift. Electronically Signed   By: Merilyn Baba M.D.   On: 03/28/2022 20:02   DG CHEST PORT 1 VIEW  Result Date: 03/28/2022 CLINICAL DATA:  Intubation. EXAM: PORTABLE CHEST 1 VIEW COMPARISON:  07/25/2014 FINDINGS: Endotracheal tube tip is 12 mm from the carina, mildly low in positioning. Enteric tube is in place with tip below the diaphragm, not included in the field of view. Probable temperature probe in place. Left-sided pacemaker in place. Low lung volumes. Chronic cardiomegaly, unchanged. No pulmonary edema, focal airspace disease, large pleural effusion or pneumothorax. IMPRESSION: 1. Endotracheal tube tip 12 mm from the carina, mildly low in positioning. Recommend retraction of 1-2 cm for optimal placement. Enteric tube in place with tip below the diaphragm, not included in the field of view. 2. Low lung volumes with chronic cardiomegaly. Electronically Signed   By: Keith Rake M.D.   On: 03/28/2022 18:06   CT HEAD WO CONTRAST (5MM)  Addendum Date: 03/28/2022   ADDENDUM REPORT: 03/28/2022 17:13 ADDENDUM: These results were called by telephone at the time of interpretation on 03/28/2022 at 5:00 pm to provider [facility] , who verbally acknowledged these results. Electronically Signed   By: Kellie Simmering D.O.   On: 03/28/2022 17:13   Result Date: 03/28/2022 CLINICAL DATA:  Provided history: Neuro  deficit, acute, stroke suspected. EXAM: CT HEAD WITHOUT CONTRAST TECHNIQUE: Contiguous axial images were obtained from the base of the skull through the vertex without intravenous contrast. RADIATION DOSE REDUCTION: This exam was performed according to the departmental dose-optimization program which includes automated exposure control, adjustment of the mA and/or kV according to patient size and/or use of iterative reconstruction technique. COMPARISON:  Non-contrast head CT 03/28/2022. CT angiogram head/neck 03/28/2022. FINDINGS: Brain: Mild generalized parenchymal atrophy. Hyperdensity within the left caudate and lentiform nuclei, which may reflect contrast staining and/or hemorrhage. No significant associated mass effect at this time. Additionally, there is moderate-volume hyperdensity along the left cerebral hemisphere, most notably within the sylvian fissure. This likely reflects a combination of subarachnoid hemorrhage and extravasated contrast. Findings have progressed from the postprocedural head CT performed earlier today at 3:29 p.m. Mild patchy and ill-defined hypoattenuation within the cerebral white matter, nonspecific but compatible with chronic small vessel ischemic disease. Redemonstrated small chronic appearing infarct within the left cerebellar hemisphere. No midline shift or hydrocephalus. Vascular: Residual circulating contrast limits evaluation for hyperdense vessels. The thrombus/embolus which was previously present within the M1 left middle cerebral artery now appears to have migrated more distally into an M2 left MCA vessel (series 3, image 15). Skull: Normal. Negative for fracture or focal lesion. Sinuses/Orbits: No mass or acute  finding within the imaged orbits. Mild mucosal thickening within the bilateral ethmoid air cells. Attempts are being made to reach the ordering provider at this time. IMPRESSION: Hyperdensity within the left caudate and lentiform nuclei, which may reflect contrast  staining and/or hemorrhage. Moderate-volume hyperdensity along the left cerebral hemisphere, likely reflecting a combination of extravasated contrast and subarachnoid hemorrhage. Findings have progressed from the post-procedure head CT performed earlier today at 3:29 p.m. There is no significant associated mass effect at this time. No evidence of hydrocephalus. The thrombus/embolus which was previously present within the M1 left middle cerebral artery now appears to have migrated more distally into an M2 left MCA vessel. Electronically Signed: By: Kellie Simmering D.O. On: 03/28/2022 16:45   CT ANGIO HEAD NECK W WO CM (CODE STROKE)  Result Date: 03/28/2022 CLINICAL DATA:  Right-sided deficits, aphasia, concern for left-sided stroke EXAM: CT ANGIOGRAPHY HEAD AND NECK TECHNIQUE: Multidetector CT imaging of the head and neck was performed using the standard protocol during bolus administration of intravenous contrast. Multiplanar CT image reconstructions and MIPs were obtained to evaluate the vascular anatomy. Carotid stenosis measurements (when applicable) are obtained utilizing NASCET criteria, using the distal internal carotid diameter as the denominator. RADIATION DOSE REDUCTION: This exam was performed according to the departmental dose-optimization program which includes automated exposure control, adjustment of the mA and/or kV according to patient size and/or use of iterative reconstruction technique. CONTRAST:  43m OMNIPAQUE IOHEXOL 350 MG/ML SOLN COMPARISON:  No prior CTA, correlation is made with CT head 03/28/2022 FINDINGS: CT HEAD FINDINGS For noncontrast findings, please see same day CT head. CTA NECK FINDINGS Aortic arch: Two-vessel arch with a common origin of the brachiocephalic and left common carotid arteries. Imaged portion shows no evidence of aneurysm or dissection. No significant stenosis of the major arch vessel origins. Aortic atherosclerosis. Right carotid system: No evidence of dissection,  occlusion, or hemodynamically significant stenosis (greater than 50%). Atherosclerotic disease at the bifurcation and in the proximal ICA is not hemodynamically significant. Left carotid system: No evidence of dissection, occlusion, or hemodynamically significant stenosis (greater than 50%). Atherosclerotic disease at the bifurcation and in the proximal ICA is not hemodynamically significant. Vertebral arteries: Mild narrowing in the proximal right V1 segment. The vertebral arteries are otherwise patent to the skull base. Skeleton: No acute osseous abnormality. Other neck: No acute finding. Upper chest: No focal pulmonary opacity or pleural effusion. Review of the MIP images confirms the above findings CTA HEAD FINDINGS Anterior circulation: Both internal carotid arteries are patent to the termini, without significant stenosis. A1 segments patent. Normal anterior communicating artery. Anterior cerebral arteries are patent to their distal aspects. Occlusion of the distal left M1 (series 7, image 92) with minimal opacification in the proximal left M2 branches (series 7, image 90 and 93) but little to no opacification past the origins or in the remainder of the left MCA branches. Mild stenosis in the distal right M1 with mild-to-moderate stenosis in the proximal more superior right M2 branch. Right MCA branches otherwise perfused. Posterior circulation: Vertebral arteries patent to the vertebrobasilar junction with mild stenosis in the mid left V4 (series 7, image 152 posterior inferior cerebellar arteries patent proximally. Basilar patent to its distal aspect. Superior cerebellar arteries patent proximally. Patent P1 segments, with moderate stenosis in the proximal right P1 (series 7, image 100-101). Long segment moderate stenosis in the right P2 (series 7, images 101-106) and poor perfusion of the right P3 segments (series 7, images 102 and 104). Mild multifocal  stenosis in the left P2 (series 7, image 105). Severe  stenosis at the origin of the more lateral left P3 (series 7, images 101-104). The bilateral posterior communicating arteries are not visualized. Venous sinuses: As permitted by contrast timing, patent. Anatomic variants: None significant. Review of the MIP images confirms the above findings IMPRESSION: 1. Occlusion of the left distal M1 with minimal flow in the proximal left M2 branches and poor perfusion of the remainder of the left MCA territory. This correlates with the hyperdensity seen on the same-day CT head. 2. Multifocal stenosis in the bilateral PCAs, which is severe at the origin of one of the left P3 branches, moderate in the proximal right P1 and throughout the P2, and mild in the left P2. Poor perfusion of the right P3 segments. 3. Additional mild stenosis in the distal right M1, proximal M2, and in the left V4. 4. Mild stenosis of the proximal right V1 segment. No other hemodynamically significant stenosis in the neck. Code stroke imaging results were communicated on 03/28/2022 at 1:44 pm to provider Dr. Cheral Marker via telephone, who verbally acknowledged these results. Electronically Signed   By: Merilyn Baba M.D.   On: 03/28/2022 14:04   CT HEAD CODE STROKE WO CONTRAST  Result Date: 03/28/2022 CLINICAL DATA:  Code stroke. EXAM: CT HEAD WITHOUT CONTRAST TECHNIQUE: Contiguous axial images were obtained from the base of the skull through the vertex without intravenous contrast. RADIATION DOSE REDUCTION: This exam was performed according to the departmental dose-optimization program which includes automated exposure control, adjustment of the mA and/or kV according to patient size and/or use of iterative reconstruction technique. COMPARISON:  None Available. FINDINGS: Brain: No evidence of acute infarction, hemorrhage, hydrocephalus, extra-axial collection or mass lesion/mass effect. Vascular: Hyperdensity in the left MCA, near the bifurcation and in the proximal M2 branches (series 5, image [id number]),  which may represent atherosclerotic disease and/or thrombus. Skull: Normal. Negative for fracture or focal lesion. Sinuses/Orbits: No acute finding. Other: The mastoids are well aerated. ASPECTS [facility] Stroke Program Early CT Score) - Ganglionic level infarction (caudate, lentiform nuclei, internal capsule, insula, M1-M3 cortex): 7 - Supraganglionic infarction (M4-M6 cortex): 3 Total score (0-10 with 10 being normal): 10 IMPRESSION: 1. Hyperdensity in the left MCA near the bifurcation and in the proximal M2 branches, which may represent atherosclerotic disease and/or thrombus. 2. ASPECTS is 10 Code stroke imaging results were communicated on 03/28/2022 at 1:14 pm to provider Dr. Cheral Marker via telephone, who verbally acknowledged these results. Electronically Signed   By: Merilyn Baba M.D.   On: 03/28/2022 13:17    Labs:  Basic Metabolic Panel:    Latest Ref Rng & Units 04/25/2022    5:25 AM 04/20/2022   11:04 AM 04/18/2022    5:55 AM  BMP  Glucose 70 - 99 mg/dL 95  123  96   BUN 8 - 23 mg/dL _0 Creatinine 0.44 - 1.00 mg/dL 0.78  0.84  0.79   Sodium 135 - 145 mmol/L 141  139  142   Potassium 3.5 - 5.1 mmol/L 3.7  4.2  3.7   Chloride 98 - 111 mmol/L 105  108  105   CO2 22 - 32 mmol/L _1 Calcium 8.9 - 10.3 mg/dL 9.5  9.1  9.3      CBC:    Latest Ref Rng & Units 04/18/2022    5:55 AM 04/13/2022    6:41 AM 04/12/2022  4:50 AM  CBC  WBC 4.0 - 10.5 K/uL 8.3  9.6  9.5   Hemoglobin 12.0 - 15.0 g/dL 12.2  11.8  11.8   Hematocrit 36.0 - 46.0 % 37.3  36.8  36.6   Platelets 150 - 400 K/uL 337  363  348      CBG: Recent Labs  Lab 04/20/22 1123 04/20/22 1629 04/21/22 0605  GLUCAP 116* 116* 104*    Brief HPI:   Elaine Ford is a 76 y.o. female with history of HTN, LBBB s/p PPM, gestational diabetes who was admitted on 03/28/22 after being found slumped on the couch with right-sided weakness and inability to speak.  She was also noted to have right facial droop with left  gaze deviation, right hemiplegia, severe dysarthria.  CT head showed hypodensity left-MCA bifurcation and proximal M2 branches.  CTA/perfusion showed occlusion of left distal M1 with minimal flow in proximal M2 branches improved perfusion of left-MCA, multifocal intracranial stenosis B-PCA, severe L-P3 blanches and moderate in right P1 and P2 with poor perfusion.  She received TNK and underwent cerebral angio with mechanical thrombectomy of left M1/PCA occlusion with residual stenosis proximal left M2/MCA.  Follow-up CT head showed small to moderate SAH/contrast extravasation..  Was noted to have episodes of atrial tachycardia and PPM interrogation showed 10 seconds of a flutter with no evidence for [facility] for EP input.  She tolerated extubation without difficulty and was kept n.p.o. with tube feeds due to severe oropharyngeal dysphagia.  Repeat CT head showed increasing edema left-MCA territory.  She was monitored in ICU with repeat CT head last of 09/22 showing conspicuous large L-MCA infarct with petechial hemorrhage and unchanged small volume SAH. Plavix was added on 09/22 to ASA with recommendations of DAPT X 3 weeks followed by ASA alone. Mentation and verbal output improving but she continued to be limited by right sided weakness with right inattention, inability to follow one step commands consistently, perseverative paraphrasia's, dysarthria and dysphagia. CIR was recommended due to functional decline.    Hospital Course: Elaine Ford was admitted to rehab 04/06/2022 for inpatient therapies to consist of PT, ST and OT at least three hours five days a week. Past admission physiatrist, therapy team and rehab RN have worked together to provide customized collaborative inpatient rehab.   Blood pressures were monitored on TID basis and   Her blood sugars were monitored every 4 hour per protocol while on tube feeds. As swallow funciotn improved, she was advanced to  Her BS continued to be reasonably  controlled and CBG checks were discontinued. Her po intake has improved.   She did have episode of hematuria and UA/UCS  Reactive leucocytosis has resolved. She did have episode of bloody urine question due to bowel or bladder issues. Lovenox was discontinued and UA was negative for UTI but showed >50 RBC. String of 5 bottles was negative. Her H/H has been monitored an is improving. No recurrent episodes noted.    Vitamin D were noted to be low normal at 32.96 and she was started on ergocalciferol for supplementation. Recommend check follow up Vitamin D levels in 6-8 weeks. .    Rehab course: During patient's stay in rehab weekly team conferences were held to monitor patient's progress, set goals and discuss barriers to discharge. At admission, patient required total assist with basic self care task and max assist with mobility. She exhibited wet voice with decreased awareness of secretion and was kept NPO due to dysphagia. She presented with  mixed aphasia with mild dysarthria and apraxia as well as lethargy affecting cognition.   She  has had improvement in activity tolerance, balance, postural control as well as ability to compensate for deficits. She has had improvement in functional use RUE  and RLE as well as improvement in awareness       Disposition:  Discharge disposition: 01-Home or Self Care        Diet:  Special Instructions:  Discharge Instructions     Ambulatory referral to Neurology   Complete by: As directed    An appointment is requested in approximately: 3-4 weeks   Ambulatory referral to Physical Medicine Rehab   Complete by: As directed       Allergies as of 04/27/2022       Reactions   Ace Inhibitors Cough   Codeine Other (See Comments)   Reaction:  Unknown    Hydrocodone-acetaminophen Other (See Comments)   Reaction:  Unknown    Lisinopril-hydrochlorothiazide Cough   Oxycodone-acetaminophen Nausea And Vomiting        Medication List     STOP  taking these medications    acetaminophen 650 MG suppository Commonly known as: TYLENOL Replaced by: acetaminophen 325 MG tablet   clopidogrel 75 MG tablet Commonly known as: PLAVIX   docusate 50 MG/5ML liquid Commonly known as: COLACE   feeding supplement (OSMOLITE 1.5 CAL) Liqd   feeding supplement (PROSource TF20) liquid   hydrALAZINE 20 MG/ML injection Commonly known as: APRESOLINE   ibuprofen 600 MG tablet Commonly known as: ADVIL   labetalol 5 MG/ML injection Commonly known as: NORMODYNE   pantoprazole sodium 40 mg Commonly known as: PROTONIX   senna-docusate 8.6-50 MG tablet Commonly known as: Senokot-S   traMADol 50 MG tablet Commonly known as: ULTRAM   VITAMIN E PO       TAKE these medications    A.E.R. Witch Hazel pad Generic drug: witch hazel-glycerin Apply topically as needed for itching. Notes to patient: For hemorrhoids    acetaminophen 325 MG tablet Commonly known as: TYLENOL Take 1-2 tablets (325-650 mg total) by mouth every 4 (four) hours as needed for mild pain. Replaces: acetaminophen 650 MG suppository   amLODipine 5 MG tablet Commonly known as: NORVASC Take 1 tablet (5 mg total) by mouth daily.   Aspirin Low Dose 81 MG chewable tablet Generic drug: aspirin Chew 1 tablet (81 mg total) by mouth daily. What changed: how to take this   atorvastatin 40 MG tablet Commonly known as: LIPITOR Take 1 tablet (40 mg total) by mouth daily. What changed: how to take this   CALCIUM 600/VITAMIN D3 PO Take 1 tablet by mouth daily.   carvedilol 25 MG tablet Commonly known as: COREG Take 1 tablet (25 mg total) by mouth 2 (two) times daily with a meal. What changed: how to take this   cetaphil lotion Apply topically 2 (two) times daily.   ketotifen 0.035 % ophthalmic solution Commonly known as: ZADITOR Place 1 drop into both eyes 2 (two) times daily. Notes to patient: Try sustane or Refresh eye drops--more moisturizing    magnesium  oxide 400 MG tablet Commonly known as: MAG-OX Take 1.5 tablets (600 mg total) by mouth at bedtime.   melatonin 3 MG Tabs tablet Take 1 tablet (3 mg total) by mouth at bedtime as needed.   mouth rinse Liqd solution 15 mLs by Mouth Rinse route 4 (four) times daily - after meals and at bedtime.   multivitamin with minerals Tabs tablet Take  1 tablet by mouth daily.   nitroGLYCERIN 0.4 MG SL tablet Commonly known as: NITROSTAT Place 0.4 mg under the tongue every 5 (five) minutes as needed for chest pain.   omeprazole 20 MG capsule Commonly known as: PRILOSEC Take 2 capsules (40 mg total) by mouth daily.   Poly-Iron 150 Forte 150-0.025-1 MG Caps Generic drug: Iron Polysacch Cmplx-B12-FA Take 1 tablet by mouth daily.   selenium sulfide 1 % Lotn Commonly known as: SELSUN Apply 1 Application topically daily. To hair line and plaques on the back of her neck. Keep it on for about 15 minutes or scrub to get the flakes off, then wash off and apply a thin layer of cetaphil lotion.   Vitamin D (Ergocalciferol) 1.25 MG (50000 UNIT) Caps capsule Commonly known as: DRISDOL Take 1 capsule (50,000 Units total) by mouth every 7 (seven) days. (On Mondays) Start taking on: May 02, 2022        Follow-up Information     [name], MD Follow up.   Specialty: Family Medicine Why: Call in 1-2 days for post hospital follow up Contact information: [address] [place] [address] [phone]         Izora Ribas, MD Follow up.   Specialty: Physical Medicine and Rehabilitation Why: office will call you with follow up appointment Contact information: [address] [place] [address] [phone]         [facility] Follow up.   Why: office will call you with follow up appointment Contact information: [address]     [place] [address] [phone]                 Signed: Bary Leriche 04/27/2022, 9:55 AM

## 2022-04-21 NOTE — Progress Notes (Signed)
Occupational Therapy Session Note  Patient Details  Name: Elaine Ford MRN: [id number] Date of Birth: 01/01/1946  Today's Date: 04/21/2022 OT Individual Time: 1300-1400 OT Individual Time Calculation (min): 60 min    Short Term Goals: Week 2:  OT Short Term Goal 1 (Week 2): Patient will complete squat pivot transfer with contact guard assist OT Short Term Goal 2 (Week 2): Patient will bathe upper body with min assist OT Short Term Goal 3 (Week 2): Patient will dress upper body with mod assist OT Short Term Goal 4 (Week 2): Patient will bathe and dress lower body with mod assist OT Short Term Goal 5 (Week 2): Patient will complete toilet hygiene with min assist  Skilled Therapeutic Interventions/Progress Updates:   Pt in bed supine upon OT arrival for session. Husband reported pt just was put in the bed to rest due to fatigue and LE discomfort from AM therapy. OT encouraged toileting as shcedule indicated toileting due at 1 pm. Husband preferred to defer to later in session and requested OT address R UE NRE. Pt rolled to L side and OT addressed gentle facilitation and PNF to scapular region with cues from both husband in Spanish and OT with + understanding and output. Pt then open to trial application of URIAS stroke recovery air splint for proximal R shoulder isolation strengthening while providing neutral warmth and deep pressure for limb awareness. Pt able to completed 2 sets of 10 reps of AAROM for scap retraction/protraction, sh flex/ex, sh abd/add and place and hold with mod cues and 1/4 ranges. Once removed, OT facilitation for hand to hair, hand to mouth then small water bottle to mouth with guiding and facilitation and excellent motor output. Pt then provided warm cloth and gentle stretch to facilitate R finger flexion as pt with tightness and 1/2 ranges PROM at digits and thumb. Instructed husband in technique and pt then demo 3/4 PROM after completion. NT De and OT assisted pt bed to  w/c to and from toilet then back to bed with standing sink side for hand washing. Pt required mod-min A for peri hygiene post voiding and mod A for garment management. Once back in bed, bed alarm set, needs in reach and husband at bedside.   Therapy Documentation Precautions:  Precautions Precautions: Fall Precaution Comments: R hemi, apraxic Restrictions Weight Bearing Restrictions: No    Therapy/Group: Individual Therapy  Barnabas Lister 04/21/2022, 7:49 AM

## 2022-04-21 NOTE — Progress Notes (Signed)
Physical Therapy Session Note  Patient Details  Name: Elaine Ford MRN: [id number] Date of Birth: [birth year]  Today's Date: 04/21/2022 PT Individual Time: 1000-1057 PT Individual Time Calculation (min): 57 min   Short Term Goals: Week 2:  PT Short Term Goal 1 (Week 2): Pt will complete bed mobility with minA PT Short Term Goal 2 (Week 2): Husband will assist patient with bed<>chair transfers PT Short Term Goal 3 (Week 2): Pt will ambulate 62f with minA and LRAD PT Short Term Goal 4 (Week 2): Pt will initiate stair training  Skilled Therapeutic Interventions/Progress Updates:       Pt supine in bed to start. Husband at bedside who was present during session to assist with interpretation as needed.  Supine<>sitting EOB with HOB slightly raised and used bed rail. Husband reports adjustable bed at home with bed rail as well. Pt requires minA for trunk support only. Sit<>stand to RW with CGA and assist for RUE placement into splint. Ambulates with minA and RW from bed to bathroom with cues for awareness and increasing R step length. Pt has difficulty stepping backwards to safely approach the toilet. Requires modA for standing balance while managing pants/brief due to R trunk lean. Pt continent of bladder once on toilet. Ambulated within her room with minA and RW to wheelchair - again having difficulty with safely approaching sitting surface and stepping bakcward with her RLE - inattentive to RUE while transitioning to sitting.  Transported to main rehab gym for time. Completed BERG balance test with results listed below.   Worked on dynamic standing balance with standing ball toss to husband with PT providing CGA and assist for facilitating RUE involvement to improve awareness. Completed standing reaching with paretic RUE with AAROM for end range. Also worked on wLockheed Martinbearing for RUE with modified plantigrade in // bars with husband providing hand-over-hand assist to RUE while PT facilitated  at the shoulder.   Returned to her room and pt requesting to remain seated in w/c. 1/2 lap tray provided. All needs met.   Therapy Documentation Precautions:  Precautions Precautions: Fall Precaution Comments: R hemi, apraxic Restrictions Weight Bearing Restrictions: No General:    Balance: Balance Balance Assessed: Yes Standardized Balance Assessment Standardized Balance Assessment: Berg Balance Test Berg Balance Test Sit to Stand: Able to stand using hands after several tries Standing Unsupported: Able to stand 2 minutes with supervision Sitting with Back Unsupported but Feet Supported on Floor or Stool: Able to sit safely and securely 2 minutes Stand to Sit: Uses backs of legs against chair to control descent Transfers: Needs one person to assist Standing Unsupported with Eyes Closed: Able to stand 10 seconds with supervision Standing Ubsupported with Feet Together: Needs help to attain position but able to stand for 30 seconds with feet together From Standing, Reach Forward with Outstretched Arm: Loses balance while trying/requires external support From Standing Position, Pick up Object from Floor: Unable to try/needs assist to keep balance From Standing Position, Turn to Look Behind Over each Shoulder: Needs assist to keep from losing balance and falling Turn 360 Degrees: Needs assistance while turning Standing Unsupported, Alternately Place Feet on Step/Stool: Needs assistance to keep from falling or unable to try Standing Unsupported, One Foot in Front: Loses balance while stepping or standing Standing on One Leg: Unable to try or needs assist to prevent fall Total Score: 16/56  Patient demonstrates increased fall risk as noted by score of   16/56 on Berg Balance Scale.  (<36= high risk  for falls, close to 100%; 37-45 significant >80%; 46-51 moderate >50%; 52-55 lower >25%)   Therapy/Group: Individual Therapy  Alger Simons 04/21/2022, 7:35 AM

## 2022-04-21 NOTE — Progress Notes (Signed)
Speech Language Pathology Weekly Progress and Session Note  Patient Details  Name: LISSANDRA KEIL MRN: [id number] Date of Birth: [birth year]  Beginning of progress report period: April 14, 2022 End of progress report period: April 21, 2022  Today's Date: 04/21/2022 SLP Individual Time: 0900-0950 Total Time: 50 minutes    Short Term Goals: Week 2: SLP Short Term Goal 1 (Week 2): Pt will demonstrate tolerance of D1 and nectar-thick liquid consistencies with minimal s/sx concerning for aspiration given Min A cues for implementation of aspiration precautions. SLP Short Term Goal 1 - Progress (Week 2): Met SLP Short Term Goal 2 (Week 2): Pt will receptively ID common objects in field of 2 (vertical presentation) with 80% accuracy given Mod A multimodal cues. SLP Short Term Goal 2 - Progress (Week 2): Met SLP Short Term Goal 3 (Week 2): Pt will respond to basic and personal yes/no questions verbally with 80% accuracy given Mod A multimodal cues. SLP Short Term Goal 3 - Progress (Week 2): Met SLP Short Term Goal 4 (Week 2): Pt will demonstrate orientation x 4 via multimodal communication given binary choice prompts. SLP Short Term Goal 4 - Progress (Week 2): Met SLP Short Term Goal 5 (Week 2): Pt will produce 2-3 word short phrases to describe pictures and/or communication functionally on 90% of opportunities given Max A multimodal cues. SLP Short Term Goal 5 - Progress (Week 2): Met SLP Short Term Goal 6 (Week 2): Pt will participate in trials of Dysphagia 2 textures with functional oral clearance and minimal s/sx concering for airway compromise given Min A for implementation of aspiration precautions. SLP Short Term Goal 6 - Progress (Week 2): Met    New Short Term Goals: Week 3: SLP Short Term Goal 1 (Week 3): STG's = LTG's due to ELOS  Weekly Progress Updates: Pt has demonstrated steady progress as evident by meeting 6 out of 6 short-term goals this reporting period. Family education  ongoing. Pt's spouse is very involved and supportive; provides appropriate cueing and encouragement throughout ST sessions. Pt continues to exhibit dysphagia, mixed aphasia (receptive and expressive) and cognitive impairment s/p CVA. Will plan to complete instrumental swallow study next date to determine safety for thin liquids prior to discharge from IPR. Continue to recommend 24/7 supervision and assistance at time of discharge, as well as [place]; pt and pt's spouse in agreement and verbalized understanding of recommendations.  Intensity: Minumum of 1-2 x/day, 30 to 90 minutes Frequency: 3 to 5 out of 7 days Duration/Length of Stay: 10/18 Treatment/Interventions: Dysphagia/aspiration precaution training;Speech/Language facilitation;Therapeutic Activities;Patient/family education   Daily Session Skilled Therapeutic Interventions:     S: Pt seen this date for skilled ST intervention targeting swallowing and communication/language goals outlined above. Pt received awake/alert and lying semi-reclined in bed. Denies pain. Agreeable to ST intervention at bedside. Spouse present and aiding in translating, as needed; pt speaking in English more often.  O: SLP facilitated today's session by providing: - Mod A for thorough oral care via suction toothbrush. - Controlled PO trials of ice chips and thin water via tsp and cup - no clinical s/sx concerning for airway compromised noted; vocal quality remained clear and dry post-swallow.  - Min A for answering yes/no questions re: basic information and personal information + preferences within the context of structured conversation. - Mod-Max A for confrontational naming of preferred + familiar holiday foods; benefited from visual and verbal cues to increase accuracy. - Oriented x 4 when given verbal binary choice prompts. -  Total A for emergent awareness of deficits s/p CVA; Mod A for intellectual awareness.  - Min A for basic command following. - Mod A verbal  and visual cue for verbalizing preferred meals to food ambassador; benefited from repetition of verbal choices and visual/gesture cues (numbers 1-3) due to decreased working memory and verbal expression.  - Sup A for sustained attention to functional conversational task for 15 minutes. - Verbal redirection and model prompting to redirect verbal perseveration's.  A: Pt appears sitmulable for skilled ST intervention as evident by improvement in production of appropriate verbal responses to basic, biographical close-ended and yes/no questions during today's session. Recommend completion of instrumental swallow study to assess readiness for advancing liquid consistencies, given hx of silent aspiration with thin liquids, next date; pt and pt's spouse verbalized understanding and agreement.  P: Pt left in bed with all safety measures activated. Call bell reviewed and within reach and all immediate needs met. Spouse remained at bedside. Continue per current ST POC next session.   Pain None/Denies  Therapy/Group: Individual Therapy  Ottis Sarnowski A Efrat Zuidema 04/21/2022, 8:06 PM

## 2022-04-21 NOTE — Plan of Care (Signed)
  Problem: RH BOWEL ELIMINATION Goal: RH STG MANAGE BOWEL WITH ASSISTANCE Description: STG Manage Bowel with mod Assistance. Outcome: Progressing; bladder program

## 2022-04-21 NOTE — Progress Notes (Signed)
PROGRESS NOTE   Subjective/Complaints: No new complaints this morning Provided list of foods to help with weight loss, diabetes, hypertension, and constipation D/ced CBGs given good control  ROS: Denies pain, constipation has improved  Objective:   No results found.  No results for input(s): "WBC", "HGB", "HCT", "PLT" in the last 72 hours.  Recent Labs    04/20/22 1104  NA 139  K 4.2  CL 108  CO2 24  GLUCOSE 123*  BUN 15  CREATININE 0.84  CALCIUM 9.1     Intake/Output Summary (Last 24 hours) at 04/21/2022 0955 Last data filed at 04/21/2022 0730 Gross per 24 hour  Intake 720 ml  Output --  Net 720 ml         Physical Exam: Vital Signs Blood pressure (!) 164/60, pulse 64, temperature 98.4 F (36.9 C), temperature source Oral, resp. rate 20, height '5\' 4"'$  (1.626 m), weight 86.1 kg, SpO2 97 %. Vitals and nursing note reviewed. BMI 32.09 Constitutional:      Comments:  NAD, in bed, cortrak has been removed Neurological:     Mental Status: drowsy but easily awakened    Comments: Right inattention with decrease in EOMI to the right. Right facial weakness. Receptive> expressive deficits with delay in processing. + perseverative echolaly. Occasional social utterance. Able to follow simple motor commands with visual and tactile cues.     General: No acute distress Mood and affect are appropriate Heart: Regular rate and rhythm no rubs murmurs or extra sounds Lungs: CTAB, breathing unlabored, no rales or wheezes.  Abdomen: Positive bowel sounds, +TTP lower abdomen, nondistended Extremities: No clubbing, cyanosis, or edema. Moving LUE antigravity; RUE no appreciable movement in bed.  Skin: No evidence of breakdown, small area of rash left face inferior and lateral to mouth- a little improved today  PE from prior encounter:  Neurologic:  Trace finger flexin RUE o/w 0/5 3- Right HF, KE, 2- ankle PT/DF Sensory  exam reduced light touch LEFT upper and lower extremities Musculoskeletal: no pain with  range of motion in all 4 extremities. No joint swelling Ambulating MinA with RW, ambulating stairs with assist Performing stair training   Assessment/Plan: 1. Functional deficits which require 3+ hours per day of interdisciplinary therapy in a comprehensive inpatient rehab setting. Physiatrist is providing close team supervision and 24 hour management of active medical problems listed below. Physiatrist and rehab team continue to assess barriers to discharge/monitor patient progress toward functional and medical goals  Care Tool:  Bathing    Body parts bathed by patient: Right arm, Chest, Abdomen, Face, Right upper leg, Left upper leg, Front perineal area, Right lower leg, Left lower leg   Body parts bathed by helper: Right lower leg, Left lower leg, Buttocks, Left arm Body parts n/a: Buttocks   Bathing assist Assist Level: Moderate Assistance - Patient 50 - 74%     Upper Body Dressing/Undressing Upper body dressing   What is the patient wearing?: Button up shirt    Upper body assist Assist Level: Moderate Assistance - Patient 50 - 74%    Lower Body Dressing/Undressing Lower body dressing      What is the patient wearing?: Pants, Incontinence  brief     Lower body assist Assist for lower body dressing: Moderate Assistance - Patient 50 - 74%     Toileting Toileting Toileting Activity did not occur (Clothing management and hygiene only): N/A (no void or bm)  Toileting assist Assist for toileting: Moderate Assistance - Patient 50 - 74%     Transfers Chair/bed transfer  Transfers assist  Chair/bed transfer activity did not occur: Safety/medical concerns  Chair/bed transfer assist level: Moderate Assistance - Patient 50 - 74%     Locomotion Ambulation   Ambulation assist      Assist level: Moderate Assistance - Patient 50 - 74% Assistive device: Hand held assist Max  distance: 20   Walk 10 feet activity   Assist  Walk 10 feet activity did not occur: Safety/medical concerns  Assist level: Moderate Assistance - Patient - 50 - 74% Assistive device: Hand held assist   Walk 50 feet activity   Assist Walk 50 feet with 2 turns activity did not occur: Safety/medical concerns         Walk 150 feet activity   Assist Walk 150 feet activity did not occur: Safety/medical concerns         Walk 10 feet on uneven surface  activity   Assist Walk 10 feet on uneven surfaces activity did not occur: Safety/medical concerns         Wheelchair     Assist Is the patient using a wheelchair?: Yes Type of Wheelchair: Manual    Wheelchair assist level: Dependent - Patient 0% Max wheelchair distance: 72f    Wheelchair 50 feet with 2 turns activity    Assist        Assist Level: Dependent - Patient 0%   Wheelchair 150 feet activity     Assist      Assist Level: Dependent - Patient 0%   Blood pressure (!) 164/60, pulse 64, temperature 98.4 F (36.9 C), temperature source Oral, resp. rate 20, height '5\' 4"'$  (1.626 m), weight 86.1 kg, SpO2 97 %.    Medical Problem List and Plan: 1. Functional deficits secondary to Left MCA infarct             -patient may  shower             -ELOS/Goals: 14-16d MinA  Start vitamin B/C supplement  Continue CIR  Discussed nutrition and exercise plan for home for stroke recovery and prevention 2.  Antithrombotics: -DVT/anticoagulation:  Mechanical: Sequential compression devices, below knee Bilateral lower extremities             -antiplatelet therapy: DAPT X 3 weeks followed by ASA alone.  3. Pain Management:  Tylenol prn.  4. Mood/Behavior/Sleep: LCSW to follow for evaluation and support.              -antipsychotic agents: N/A 5. Neuropsych/cognition: This patient is not fully capable of making decisions on her own behalf. 6. Skin/Wound Care: Routine pressure relief measures.  7.  Fluids/Electrolytes/Nutrition: Monitor I/O. Check CMET in am. 8.  HTN: Monitor BP TID--continue coreg with BP goal <160. Increase amlodipine to '5mg'$ . Provided list of foods to help with blood pressure.  9. Dysphagia: Advance to D3 diet. Asking nursing to provide her with a water chart so we can ensure she is drinking 6-8 glasses of thickened water per day Diet Orders (From admission, onward)     Start     Ordered   04/20/22 1230  DIET DYS 3 Room service appropriate? Yes with Assist; Fluid  consistency: Nectar Thick  Diet effective 0500       Comments: Add extra sauces and gravy; thank you  Question Answer Comment  Room service appropriate? Yes with Assist   Fluid consistency: Nectar Thick      04/20/22 1229            10. Hyperglycemia: Hgb A1C-5.5. Discontinue CBGs.Provided list of foods to help with elevated blood sugars.  Recent Labs    04/20/22 1123 04/20/22 1629 04/21/22 0605  GLUCAP 116* 116* 104*    11 L-BBB s/p PPM: Brief episode of A flutter. Continue to monitor. 12. Hyperlipidemia: Continue Lipitor 13.  ABLA: normalized, continue to monitor weekly.  14. UTI: symptoms resolved.  15. Chest congestion: chest PT ordered. Decrease TF rate to 50/hour to avoid fluid overload 16. Obesity: BMI 32-->33.45-->32.81: provide dietary education, decrease TF rate to avoid fluid overload, appears less congested with decrease, discussed with patient and husband, daily weights ordered.  17. Hemorrhoid: witch hazel pads ordered. Discussed that softening the stool will help to prevent straining and prevent new hemorrhoids from forming.  18. Gross hematuria: continue to hold lovenox, improved.  19. Constipation: discontinue senna-docusate  20. Small area of rash L face  -possibly minor contact dermatitis, avoid new soaps or orther possible irritants, continue to monitor for now, appears to be improving 21. Suboptimal vitamin D level: start ergocalciferol 50,000U once per weel for 7 weeks.   22. Suboptimal potassium: continue klor 40mq daily. Level was 4.2 on 10/11  LOS: 15 days A FACE TO FACE EVALUATION WAS PERFORMED  KClide DeutscherRaulkar 04/21/2022, 9:55 AM

## 2022-04-22 ENCOUNTER — Inpatient Hospital Stay (HOSPITAL_COMMUNITY): Payer: Medicare HMO

## 2022-04-22 NOTE — Progress Notes (Signed)
Orthopedic Tech Progress Note Patient Details:  Elaine Ford 04/01/1946 [id number]  Ortho Devices Type of Ortho Device: Thumb velcro splint Ortho Device/Splint Location: RUE Ortho Device/Splint Interventions: Ordered, Application, Adjustment   Post Interventions Patient Tolerated: Well Instructions Provided: Care of device, Adjustment of device  Elaine Ford A Elaine Ford 04/22/2022, 10:37 AM

## 2022-04-22 NOTE — Progress Notes (Signed)
Occupational Therapy Session Note  Patient Details  Name: JOBY HERSHKOWITZ MRN: [id number] Date of Birth: October 12, 1945  Today's Date: 04/22/2022 OT Individual Time: 1045-1200 OT Individual Time Calculation (min): 75 min    Skilled Therapeutic Interventions/Progress Updates: Patient received resting in bed with husband present. Assisted OOB with Min assist. Stand pivot to w/c Min assist. Patient seen for transfer to therapy mat for mobilization of scapula and GH joint prior to functional reach retraining and pre-reach skills development. Educated patient's husband and daughter on techniques for assisting RUE to develop proximal strength. Patient with good assist with reaching tolerating 150 degrees assisted reach/shoulder flexion. Followed with supination pronation and carpal mobilization in preparation for wt bearing at [place] and in forward reach position. Mobilization and stretching for all digits needed due to pain and stiffness. Patient able to use distal support for assisted reach and IR/ER mobility. Patient and family with good attention and motivation to continue work between therapies. Patient's husband expressed desire for [place] initially. Continue with skilled OT POC     Therapy Documentation Precautions:  Precautions Precautions: Fall Precaution Comments: R hemi, apraxic Restrictions Weight Bearing Restrictions: No General: Patient reports aching at R thumb and wrist. Pain improved with mobilization   Therapy/Group: Individual Therapy  Hermina Barters 04/22/2022, 12:36 PM

## 2022-04-22 NOTE — Progress Notes (Signed)
Physical Therapy Weekly Progress Note  Patient Details  Name: Elaine Ford MRN: [id number] Date of Birth: May 02, 1946  Beginning of progress report period: April 15, 2022 End of progress report period: April 22, 2022  Today's Date: 04/22/2022 PT Individual Time: 1445-1530 PT Individual Time Calculation (min): 45 min   Patient has met 4 of 4 short term goals.  Pt is making appropriate progress towards goals of CGA/minA overall. She currently is at [facility] level for bed mobility, stand<>pivot transfers, short distance gait with RW, and navigating 4 steps with 1 hand rail. Pt is able to ambulate ~69f minA but requires modA for any distance greater due to deteroriating gait, RLE weakness, and persistent R trunk lean. Pt's husband is supportive and will be her primary caregiver - he has been present for every therapy session and has began to participate with hands on training for transfers, gait, and stairs. Pt continues to be primarily limited by decreased activity tolerance, R sided weakness (RUE > RLE), motor apraxia, R inattention, and poor standing balance.  (BERG 16/54 on 04/21/22).   Patient continues to demonstrate the following deficits muscle weakness, decreased cardiorespiratoy endurance, unbalanced muscle activation, motor apraxia, decreased coordination, and decreased motor planning, decreased initiation, decreased attention, decreased awareness, decreased problem solving, decreased safety awareness, decreased memory, and delayed processing, and decreased standing balance, decreased postural control, hemiplegia, and decreased balance strategies and therefore will continue to benefit from skilled PT intervention to increase functional independence with mobility.  Patient progressing toward long term goals..  Continue plan of care.  PT Short Term Goals Week 2:  PT Short Term Goal 1 (Week 2): Pt will complete bed mobility with minA PT Short Term Goal 2 (Week 2): Husband will assist patient  with bed<>chair transfers PT Short Term Goal 3 (Week 2): Pt will ambulate 515fwith minA and LRAD PT Short Term Goal 4 (Week 2): Pt will initiate stair training Week 3:  PT Short Term Goal 1 (Week 3): STG = LTG due to ELOS  Skilled Therapeutic Interventions/Progress Updates:      Pt sitting in w/c to start with her husband at bedside. Focused session on continued hands on family training and education.  Husband completed bed<>chair transfers with PT providing supervision for safety - no cues needed for setup or transfer. Husband able to manage and instruct patient well with safe transfers. Signed off of safety plan for bed<>chair transfer only - RN updated as well.  Transported in w/c to ortho rehab gym for time  Husband assisted patient with ambulatory (~103fcar transfer with PT providing close supervision for safety. Husband guarding patient well on her R side and able to cue patient for increasing R step length when needed. Pt able to complete car transfer with minA from husband - PT holding door open to simulate their car.   Stand<>pivot transfer with minA to Nustep machine. Completed 10 minutes at L6 resistance using BLE only to isolate for strengthening and cardiovascular endurance.  285 Steps achieved Pt able to complete full ROM on R and keep her R knee in neutral alignment. Husband assisted patient with transferring back to w/c with stand<>Pivot, safe technique.  Returned to room and patient remained seated in w/c, all needs met.    Therapy Documentation Precautions:  Precautions Precautions: Fall Precaution Comments: R hemi, apraxic Restrictions Weight Bearing Restrictions: No General:    Therapy/Group: Individual Therapy  Markis Langland P Eldredge Veldhuizen PT 04/22/2022, 7:33 AM

## 2022-04-22 NOTE — Progress Notes (Signed)
PROGRESS NOTE   Subjective/Complaints: Complaining of pain in her right thumb and wrist. Thumb spica splint ordered She has no new complaints Improved walking with therapy!  ROS: +pain in right thumb and wrist, constipation has improved  Objective:   No results found.  No results for input(s): "WBC", "HGB", "HCT", "PLT" in the last 72 hours.  Recent Labs    04/20/22 1104  NA 139  K 4.2  CL 108  CO2 24  GLUCOSE 123*  BUN 15  CREATININE 0.84  CALCIUM 9.1     Intake/Output Summary (Last 24 hours) at 04/22/2022 0921 Last data filed at 04/22/2022 0756 Gross per 24 hour  Intake 420 ml  Output --  Net 420 ml         Physical Exam: Vital Signs Blood pressure (!) 144/60, pulse 62, temperature 98.2 F (36.8 C), temperature source Oral, resp. rate 17, height '5\' 4"'$  (1.626 m), weight 85.6 kg, SpO2 97 %. Vitals and nursing note reviewed. BMI 32.09 Constitutional:      Comments:  NAD, in bed, cortrak has been removed Neurological:     Mental Status: drowsy but easily awakened    Comments: Right inattention with decrease in EOMI to the right. Right facial weakness. Receptive> expressive deficits with delay in processing. + perseverative echolaly. Occasional social utterance. Able to follow simple motor commands with visual and tactile cues.     General: No acute distress Mood and affect are appropriate Heart: Regular rate and rhythm no rubs murmurs or extra sounds Lungs: CTAB, breathing unlabored, no rales or wheezes.  Abdomen: Positive bowel sounds, +TTP lower abdomen, nondistended Extremities: No clubbing, cyanosis, or edema. Moving LUE antigravity; RUE no appreciable movement in bed.  Skin: No evidence of breakdown, small area of rash left face inferior and lateral to mouth- a little improved today  PE from prior encounter:  Neurologic:  Trace finger flexin RUE o/w 0/5 3- Right HF, KE, 2- ankle PT/DF Sensory  exam reduced light touch LEFT upper and lower extremities Musculoskeletal: no pain with  range of motion in all 4 extremities. No joint swelling Ambulating MinA with RW, ambulating stairs with assist Performing stair training MinA to answer yes and no questions   Assessment/Plan: 1. Functional deficits which require 3+ hours per day of interdisciplinary therapy in a comprehensive inpatient rehab setting. Physiatrist is providing close team supervision and 24 hour management of active medical problems listed below. Physiatrist and rehab team continue to assess barriers to discharge/monitor patient progress toward functional and medical goals  Care Tool:  Bathing    Body parts bathed by patient: Right arm, Chest, Abdomen, Face, Right upper leg, Left upper leg, Front perineal area, Right lower leg, Left lower leg   Body parts bathed by helper: Right lower leg, Left lower leg, Buttocks, Left arm Body parts n/a: Buttocks   Bathing assist Assist Level: Moderate Assistance - Patient 50 - 74%     Upper Body Dressing/Undressing Upper body dressing   What is the patient wearing?: Button up shirt    Upper body assist Assist Level: Moderate Assistance - Patient 50 - 74%    Lower Body Dressing/Undressing Lower body dressing  What is the patient wearing?: Pants, Incontinence brief     Lower body assist Assist for lower body dressing: Moderate Assistance - Patient 50 - 74%     Toileting Toileting Toileting Activity did not occur (Clothing management and hygiene only): N/A (no void or bm)  Toileting assist Assist for toileting: Moderate Assistance - Patient 50 - 74%     Transfers Chair/bed transfer  Transfers assist  Chair/bed transfer activity did not occur: Safety/medical concerns  Chair/bed transfer assist level: Moderate Assistance - Patient 50 - 74%     Locomotion Ambulation   Ambulation assist      Assist level: Moderate Assistance - Patient 50 -  74% Assistive device: Hand held assist Max distance: 20   Walk 10 feet activity   Assist  Walk 10 feet activity did not occur: Safety/medical concerns  Assist level: Moderate Assistance - Patient - 50 - 74% Assistive device: Hand held assist   Walk 50 feet activity   Assist Walk 50 feet with 2 turns activity did not occur: Safety/medical concerns         Walk 150 feet activity   Assist Walk 150 feet activity did not occur: Safety/medical concerns         Walk 10 feet on uneven surface  activity   Assist Walk 10 feet on uneven surfaces activity did not occur: Safety/medical concerns         Wheelchair     Assist Is the patient using a wheelchair?: Yes Type of Wheelchair: Manual    Wheelchair assist level: Dependent - Patient 0% Max wheelchair distance: 47f    Wheelchair 50 feet with 2 turns activity    Assist        Assist Level: Dependent - Patient 0%   Wheelchair 150 feet activity     Assist      Assist Level: Dependent - Patient 0%   Blood pressure (!) 144/60, pulse 62, temperature 98.2 F (36.8 C), temperature source Oral, resp. rate 17, height '5\' 4"'$  (1.626 m), weight 85.6 kg, SpO2 97 %.    Medical Problem List and Plan: 1. Functional deficits secondary to Left MCA infarct             -patient may  shower             -ELOS/Goals: 14-16d MinA  Start vitamin B/C supplement  Continue CIR  Discussed nutrition and exercise plan for home for stroke recovery and prevention 2.  Antithrombotics: -DVT/anticoagulation:  Mechanical: Sequential compression devices, below knee Bilateral lower extremities             -antiplatelet therapy: DAPT X 3 weeks followed by ASA alone.  3. Pain Management:  Tylenol prn.  4. Mood/Behavior/Sleep: LCSW to follow for evaluation and support.              -antipsychotic agents: N/A 5. Neuropsych/cognition: This patient is not fully capable of making decisions on her own behalf. 6. Skin/Wound Care:  Routine pressure relief measures.  7. Fluids/Electrolytes/Nutrition: Monitor I/O. Check CMET in am. 8.  HTN: Monitor BP TID--continue coreg with BP goal <160. Increase amlodipine to '5mg'$ . Provided list of foods to help with blood pressure.  9. Dysphagia: Advance to D3 diet. Asking nursing to provide her with a water chart so we can ensure she is drinking 6-8 glasses of thickened water per day Diet Orders (From admission, onward)     Start     Ordered   04/21/22 1351  DIET DYS 3  Room service appropriate? Yes with Assist; Fluid consistency: Nectar Thick  Diet effective 0500       Comments: Add extra sauces and gravy; NO STRAWS thank you  Question Answer Comment  Room service appropriate? Yes with Assist   Fluid consistency: Nectar Thick      04/21/22 1351            10. Hyperglycemia: Hgb A1C-5.5. Discontinue CBGs.Provided list of foods to help with elevated blood sugars. Discussed metformin as an option but patient and husband prefer dietary/exercise strategies first Recent Labs    04/20/22 1123 04/20/22 1629 04/21/22 0605  GLUCAP 116* 116* 104*    11 L-BBB s/p PPM: Brief episode of A flutter. Continue to monitor. 12. Hyperlipidemia: continue Lipitor 13.  ABLA: normalized, continue to monitor weekly.  14. UTI: symptoms resolved.  15. Chest congestion: chest PT ordered. Resolved with discontinuation of TF 16. Obesity: BMI 32-->33.45-->32.81-->32.39: provide dietary education, TF discontinued discussed with patient and husband, may discontinue daily weights 17. Hemorrhoid: witch hazel pads ordered. Discussed that softening the stool will help to prevent straining and prevent new hemorrhoids from forming.  18. Gross hematuria: continue to hold lovenox, improved.  19. Constipation: discontinue senna-docusate  20. Small area of rash L face  -possibly minor contact dermatitis, avoid new soaps or orther possible irritants, continue to monitor for now, appears to be improving 21.  Suboptimal vitamin D level: start ergocalciferol 50,000U once per weel for 7 weeks.  22. Suboptimal potassium: continue klor 7mq daily. Level was 4.2 on 10/11  LOS: 16 days A FACE TO FACE EVALUATION WAS PERFORMED  Meryem Haertel P Lakisa Lotz 04/22/2022, 9:21 AM

## 2022-04-22 NOTE — Progress Notes (Signed)
Modified Barium Swallow Progress Note  Patient Details  Name: Elaine Ford MRN: [id number] Date of Birth: Apr 07, 1946  Today's Date: 04/22/2022  Modified Barium Swallow completed.  Full report located under Chart Review in the Imaging Section.  Brief recommendations include the following:  Clinical Impression MBSS completed to assess pt's readiness for thin liquids given hx of silent aspiration on MBSS completed on 04/12/2022. Of note, pt with improved swallow function in comparison to most recent MBSS.   This date, pt presents with functional deglutition with no evidence of penetration nor aspiration across assessed consistencies (thin liquid through soft solids). Pt with slightly decreased pharyngeal peristalsis and variable swallow initiation that appeared determinate on volume; I suspect these findings were present prior to admission, and appear more consistent with presbyphagia.   Given objective findings, recommend continuation of Dysphagia 3 textures and advancement of liquids to thin via cup or straw; however, pt appeared to demonstrate a more consistent swallow initiation with use of straw vs cup. Continue to recommend full supervision to ensure safety. Medications may be administered whole in puree. Findings and recommendations shared with pt, pt's spouse, and interdisciplinary team; all parties verbalized understanding and agreement.   Swallow Evaluation Recommendations   SLP Diet Recommendations: Dysphagia 3 (Mech soft) solids;Thin liquid   Liquid Administration via: Straw;Cup (does best with straw)   Medication Administration: Whole meds with puree   Supervision: Full supervision/cueing for compensatory strategies;Patient able to self feed   Compensations: Minimize environmental distractions;Slow rate;Small sips/bites   Postural Changes: Seated upright at 90 degrees   Oral Care Recommendations: Oral care BID    Lukasz Rogus A Michell Giuliano 04/22/2022,11:47 AM

## 2022-04-22 NOTE — Plan of Care (Signed)
  Problem: RH BOWEL ELIMINATION Goal: RH STG MANAGE BOWEL W/MEDICATION W/ASSISTANCE Description: STG Manage Bowel with Medication with min Assistance. Outcome: Not Progressing; incontinence   Problem: RH BLADDER ELIMINATION Goal: RH STG MANAGE BLADDER WITH ASSISTANCE Description: STG Manage Bladder With mod Assistance Outcome: Not Progressing; incontinence   Problem: RH SKIN INTEGRITY Goal: RH STG SKIN FREE OF INFECTION/BREAKDOWN Description: Skin will be free of infection/breakdown with min assist Outcome: Not Progressing; rash on face, neck; new orders noted   Problem: RH KNOWLEDGE DEFICIT Goal: RH STG INCREASE KNOWLEDGE OF HYPERTENSION Description: Patient/caregiver will be able to verbalize HTN medications and goal blood pressure via nursing education and handouts Outcome: Not Progressing; aphasic; husband in room

## 2022-04-22 NOTE — Progress Notes (Signed)
Occupational Therapy Session Note  Patient Details  Name: Elaine Ford MRN: [id number] Date of Birth: 1946-06-18  Today's Date: 04/22/2022 OT Individual Time: 0800-0900 OT Individual Time Calculation (min): 60 min    Short Term Goals: Week 2:  OT Short Term Goal 1 (Week 2): Patient will complete squat pivot transfer with contact guard assist OT Short Term Goal 2 (Week 2): Patient will bathe upper body with min assist OT Short Term Goal 3 (Week 2): Patient will dress upper body with mod assist OT Short Term Goal 4 (Week 2): Patient will bathe and dress lower body with mod assist OT Short Term Goal 5 (Week 2): Patient will complete toilet hygiene with min assist  Skilled Therapeutic Interventions/Progress Updates:    Patient received supine in bed - declined shower - "tomorrow"  Patient declined changing into clean clothing and reports just toileting with nursing staff.  Patient walked with mod/ min assist part way to gym. Husband walked with patient approximately 50 feet.  Cueing her in Spanish appropriately.  Worked with patient on prefunctional and functional use of RUE.   Working for improved inter limb coordination - folding a towel on tabletop - standing up.  Working on grasp of towel with hand and lift of hand to fold.  Patient remain inattentive to right side - and has difficulty controlling more than one joint at a time.   Patient returned to room, and assisted to change into clean pajamas.  Daughter coming this afternoon from [place].   Patient transferred back to bed for MBSS transport.   Spoke with husband about working toward him assisting with toileting probably early next week.  He is in agreement and very willing to get hands on.    Therapy Documentation Precautions:  Precautions Precautions: Fall Precaution Comments: R hemi, apraxic Restrictions Weight Bearing Restrictions: No  Pain: Reports pain in thumb/wrist (right) spoke with MD regarding thumb spica splint.     Therapy/Group: Individual Therapy  Mariah Milling 04/22/2022, 12:31 PM

## 2022-04-23 NOTE — Progress Notes (Signed)
PROGRESS NOTE   Subjective/Complaints: No new complaints this morning Her husband requests a script for her stairlift as this will save him from having to pay for its sale tax. Sent staff message to [facility]  ROS: +pain in right thumb and wrist, constipation has improved, +rash on left lower side of mouth  Objective:   DG Swallowing Func-Speech Pathology  Result Date: 04/22/2022 Table formatting from the original result was not included. Objective Swallowing Evaluation: Type of Study: MBS-Modified Barium Swallow Study  Patient Details Name: Elaine Ford MRN: [id number] Date of Birth: [birth year] Today's Date: 04/22/2022 Time: SLP Start Time (ACUTE ONLY): 27 -SLP Stop Time (ACUTE ONLY): 1107 SLP Time Calculation (min) (ACUTE ONLY): 27 min Past Medical History: Past Medical History: Diagnosis Date  Back pain   Broken arm   age 43 years old broke right arm after a fall  Gall bladder stones   Gestational diabetes   Hemorrhoid   Hx of cardiac cath   [facility] 07/24/14 at [facility] - no significant CAD  Hx of cardiovascular stress test   Myoview 11/14:  No ischemia  Hx of echocardiogram   Echo 07/23/14 - at [facility]:  EF 65-70%, impaired relaxation, mild LAE, mild MR, mild AI, mild elevated PASP (44.2 mmHg)  Hyperlipidemia   Hypertension   LBBB (left bundle branch block)   Low blood pressure   Nerve damage   right arm  Pain   Left upper chest s/p pacer maker insertion site 07/2014  S/P cardiac pacemaker procedure 07/2014  [employer] - Dr. Lovena Le  Stroke [facility])   patient was unaware noted prior to pacermaker placement unknown when it occurred Past Surgical History: Past Surgical History: Procedure Laterality Date  COLONOSCOPY    DILATATION & CURETTAGE/HYSTEROSCOPY WITH MYOSURE N/A 02/29/2016  Procedure: DILATATION & CURETTAGE/HYSTEROSCOPY WITH MYOSURE;  Surgeon: [name], DO;  Location: [place] [facility];  Service: Gynecology;  Laterality: N/A;  patient has a pacemaker   GALLBLADDER  SURGERY    IR 3D INDEPENDENT WKST  03/28/2022  IR PERCUTANEOUS ART THROMBECTOMY/INFUSION INTRACRANIAL INC DIAG ANGIO  03/28/2022  IR US GUIDE VASC ACCESS RIGHT  03/28/2022  LAPAROSCOPIC BILATERAL SALPINGO OOPHERECTOMY  04/20/2016  Procedure: LAPAROSCOPIC BILATERAL SALPINGO OOPHORECTOMY;  Surgeon: [name], DO;  Location: [place] [facility];  Service: Gynecology;;  LEAD REVISION N/A 07/25/2014  Procedure: LEAD REVISION;  Surgeon: Deboraha Sprang, MD;  Location: [facility] C[facility] LAB;  Service: Cardiovascular;  Laterality: N/A;  PERMANENT PACEMAKER INSERTION N/A 07/24/2014  Procedure: PERMANENT PACEMAKER INSERTION;  Surgeon: [name], MD;  Location: [facility] CATH LAB;  Service: Cardiovascular;  Laterality: N/A;  RADIOLOGY WITH ANESTHESIA N/A 03/28/2022  Procedure: IR WITH ANESTHESIA;  Surgeon: Radiologist, Medication, MD;  Location: [place];  Service: Radiology;  Laterality: N/A;  VAGINAL HYSTERECTOMY  04/20/2016  Procedure: HYSTERECTOMY VAGINAL;  Surgeon: [name], DO;  Location: [place] [facility];  Service: Gynecology;; HPI: 9/18 admit for CVA.  CT of the head demonstrated hypodensity in the left MCA without hemorrhage.   Systemic TNK mechanical thrombectomy.  Post procedurally on vent to ICU. Postoperative CT did demonstrate small subarachnoid hemorrhage.Extubated 9/19. No MRI at this time.  No data recorded  Recommendations for follow up therapy  are one component of a multi-disciplinary discharge planning process, led by the attending physician.  Recommendations may be updated based on patient status, additional functional criteria and insurance authorization. Assessment / Plan / Recommendation   04/22/2022  11:46 AM Clinical Impressions Clinical Impression MBSS completed to assess pt's readiness for thin liquids given hx of silent aspiration on MBSS completed on 04/12/2022. Of note, pt with improved swallow function in comparison to most recent MBSS. This date, pt presents with functional deglutition with no evidence of penetration nor  aspiration across assessed consistencies (thin liquid through soft solids). Pt with slightly decreased pharyngeal peristalsis and variable swallow initiation that appeared determinate on volume; I suspect these findings were present prior to admission, and appear more consistent with presbyphagia. Given objective findings, recommend continuation of Dysphagia 3 textures and advancement of liquids to thin via cup or straw; however, pt appeared to demonstrate a more consistent swallow initiation with use of straw vs cup. Continue to recommend full supervision to ensure safety. Medications may be administered whole in puree. Findings and recommendations shared with pt, pt's spouse, and interdisciplinary team; all parties verbalized understanding and agreement.  SLP Visit Diagnosis Dysphagia, oropharyngeal phase (R13.12);Aphasia (R47.01);Cognitive communication deficit (R41.841) Impact on safety and function Mild aspiration risk     03/30/2022   2:00 PM Treatment Recommendations Treatment Recommendations Therapy as outlined in treatment plan below     04/22/2022  11:46 AM Prognosis Prognosis for Safe Diet Advancement Good Barriers to Reach Goals Cognitive deficits;Language deficits;Severity of deficits   04/22/2022  11:46 AM Diet Recommendations SLP Diet Recommendations Dysphagia 3 (Mech soft) solids;Thin liquid Liquid Administration via Straw;Cup Medication Administration Whole meds with puree Compensations Minimize environmental distractions;Slow rate;Small sips/bites Postural Changes Seated upright at 90 degrees     04/22/2022  11:46 AM Other Recommendations Oral Care Recommendations Oral care BID   03/30/2022   2:00 PM Frequency and Duration  Speech Therapy Frequency (ACUTE ONLY) min 2x/week Treatment Duration 2 weeks     04/22/2022  11:42 AM Oral Phase Oral Phase Impaired Oral - Honey Teaspoon NT Oral - Nectar Teaspoon NT Oral - Nectar Cup Premature spillage;Decreased bolus cohesion Oral - Nectar Straw NT Oral - Thin  Teaspoon NT Oral - Thin Cup Premature spillage;Decreased bolus cohesion Oral - Thin Straw Premature spillage;Decreased bolus cohesion Oral - Puree WFL Oral - Mech Soft WFL Oral - Regular NT Oral - Multi-Consistency NT Oral - Pill NT    04/22/2022  11:43 AM Pharyngeal Phase Pharyngeal Phase Impaired Pharyngeal- Honey Teaspoon NT Pharyngeal- Nectar Teaspoon NT Pharyngeal- Nectar Cup Delayed swallow initiation-vallecula;Delayed swallow initiation-pyriform sinuses;Reduced pharyngeal peristalsis;Pharyngeal residue - valleculae;Pharyngeal residue - pyriform Pharyngeal Material does not enter airway Pharyngeal- Nectar Straw NT Pharyngeal- Thin Teaspoon NT Pharyngeal- Thin Cup Delayed swallow initiation-vallecula;Reduced pharyngeal peristalsis;Pharyngeal residue - valleculae;Pharyngeal residue - pyriform Pharyngeal Material does not enter airway Pharyngeal- Thin Straw Delayed swallow initiation-vallecula;Reduced pharyngeal peristalsis;Pharyngeal residue - valleculae;Pharyngeal residue - pyriform Pharyngeal Material does not enter airway Pharyngeal- Puree WFL Pharyngeal Material does not enter airway Pharyngeal- Mechanical Soft WFL Pharyngeal Material does not enter airway Pharyngeal- Regular NT Pharyngeal- Multi-consistency NT Pharyngeal- Pill NT    04/22/2022  11:46 AM Cervical Esophageal Phase  Cervical Esophageal Phase WFL Bethany A Lutes 04/22/2022, 11:56 AM                      No results for input(s): "WBC", "HGB", "HCT", "PLT" in the last 72 hours.  No results for input(s): "NA", "K", "CL", "CO2", "  GLUCOSE", "BUN", "CREATININE", "CALCIUM" in the last 72 hours.    Intake/Output Summary (Last 24 hours) at 04/23/2022 1335 Last data filed at 04/22/2022 1856 Gross per 24 hour  Intake 177 ml  Output --  Net 177 ml         Physical Exam: Vital Signs Blood pressure (!) 150/60, pulse 62, temperature 98.3 F (36.8 C), resp. rate 18, height '5\' 4"'$  (1.626 m), weight 85.6 kg, SpO2 96 %. Vitals and nursing  note reviewed. BMI 32.39 Constitutional:      Comments:  NAD, in bed, cortrak has been removed Neurological:     Mental Status: drowsy but easily awakened    Comments: Right inattention with decrease in EOMI to the right. Right facial weakness. Receptive> expressive deficits with delay in processing. + perseverative echolaly. Occasional social utterance. Able to follow simple motor commands with visual and tactile cues.     General: No acute distress Mood and affect are appropriate Heart: Regular rate and rhythm no rubs murmurs or extra sounds Lungs: CTAB, breathing unlabored, no rales or wheezes.  Abdomen: Positive bowel sounds, no longer TTP lower abdomen, nondistended Extremities: No clubbing, cyanosis, or edema. Moving LUE antigravity; RUE no appreciable movement in bed.  Skin: No evidence of breakdown, small area of rash left face inferior and lateral to mouth- a little improved today, chapped lips  PE from prior encounter:  Neurologic:  Trace finger flexin RUE o/w 0/5 3- Right HF, KE, 2- ankle PT/DF Sensory exam reduced light touch LEFT upper and lower extremities Musculoskeletal: no pain with  range of motion in all 4 extremities. No joint swelling Ambulating MinA with RW, ambulating stairs with assist Performing stair training MinA to answer yes and no questions   Assessment/Plan: 1. Functional deficits which require 3+ hours per day of interdisciplinary therapy in a comprehensive inpatient rehab setting. Physiatrist is providing close team supervision and 24 hour management of active medical problems listed below. Physiatrist and rehab team continue to assess barriers to discharge/monitor patient progress toward functional and medical goals  Care Tool:  Bathing    Body parts bathed by patient: Right arm, Chest, Abdomen, Face, Right upper leg, Left upper leg, Front perineal area, Right lower leg, Left lower leg, Left arm, Buttocks   Body parts bathed by helper: Right lower  leg, Left lower leg, Buttocks, Left arm Body parts n/a: Buttocks   Bathing assist Assist Level: Minimal Assistance - Patient > 75%     Upper Body Dressing/Undressing Upper body dressing   What is the patient wearing?: Button up shirt    Upper body assist Assist Level: Moderate Assistance - Patient 50 - 74%    Lower Body Dressing/Undressing Lower body dressing      What is the patient wearing?: Pants, Incontinence brief     Lower body assist Assist for lower body dressing: Moderate Assistance - Patient 50 - 74%     Toileting Toileting Toileting Activity did not occur (Clothing management and hygiene only): N/A (no void or bm)  Toileting assist Assist for toileting: Moderate Assistance - Patient 50 - 74%     Transfers Chair/bed transfer  Transfers assist  Chair/bed transfer activity did not occur: Safety/medical concerns  Chair/bed transfer assist level: Moderate Assistance - Patient 50 - 74%     Locomotion Ambulation   Ambulation assist      Assist level: Moderate Assistance - Patient 50 - 74% Assistive device: Hand held assist Max distance: 20   Walk 10 feet activity  Assist  Walk 10 feet activity did not occur: Safety/medical concerns  Assist level: Moderate Assistance - Patient - 50 - 74% Assistive device: Hand held assist   Walk 50 feet activity   Assist Walk 50 feet with 2 turns activity did not occur: Safety/medical concerns         Walk 150 feet activity   Assist Walk 150 feet activity did not occur: Safety/medical concerns         Walk 10 feet on uneven surface  activity   Assist Walk 10 feet on uneven surfaces activity did not occur: Safety/medical concerns         Wheelchair     Assist Is the patient using a wheelchair?: Yes Type of Wheelchair: Manual    Wheelchair assist level: Dependent - Patient 0% Max wheelchair distance: 48f    Wheelchair 50 feet with 2 turns activity    Assist        Assist  Level: Dependent - Patient 0%   Wheelchair 150 feet activity     Assist      Assist Level: Dependent - Patient 0%   Blood pressure (!) 150/60, pulse 62, temperature 98.3 F (36.8 C), resp. rate 18, height '5\' 4"'$  (1.626 m), weight 85.6 kg, SpO2 96 %.    Medical Problem List and Plan: 1. Functional deficits secondary to Left MCA infarct             -patient may  shower             -ELOS/Goals: 14-16d MinA  Start vitamin B/C supplement  Continue CIR  Discussed nutrition and exercise plan for home for stroke recovery and prevention  Messaged Becky regarding helping husband with script for stairlift on Monday 2.  Antithrombotics: -DVT/anticoagulation:  Mechanical: Sequential compression devices, below knee Bilateral lower extremities             -antiplatelet therapy: DAPT X 3 weeks followed by ASA alone.  3. Pain Management:  Tylenol prn.  4. Mood/Behavior/Sleep: LCSW to follow for evaluation and support.              -antipsychotic agents: N/A 5. Neuropsych/cognition: This patient is not fully capable of making decisions on her own behalf. 6. Skin/Wound Care: Routine pressure relief measures.  7. Fluids/Electrolytes/Nutrition: Monitor I/O. 8.  HTN: Monitor BP TID--continue coreg with BP goal <160. Currently patient is at goal. Continue amlodipine '5mg'$ . Provided list of foods to help with blood pressure. Continue magnesium gluconate '500mg'$  HS 9. Dysphagia: Advance to D3/thins. Asking nursing to provide her with a water chart so we can ensure she is drinking 6-8 glasses of water per day Diet Orders (From admission, onward)     Start     Ordered   04/22/22 1217  DIET DYS 3 Room service appropriate? Yes with Assist; Fluid consistency: Thin  Diet effective 0500       Comments: Add extra sauces and gravy  Question Answer Comment  Room service appropriate? Yes with Assist   Fluid consistency: Thin      04/22/22 1217            10. Hyperglycemia: Hgb A1C-5.5. Discontinue  CBGs.Provided list of foods to help with elevated blood sugars. Discussed metformin as an option but patient and husband prefer dietary/exercise strategies first. Discussed the benefits of walking after meals.  Recent Labs    04/20/22 1629 04/21/22 0605  GLUCAP 116* 104*    11 L-BBB s/p PPM: Brief episode of A flutter. Continue  to monitor. Continue magnesium gluconate '500mg'$  HS 12. Hyperlipidemia: continue Lipitor 13.  ABLA: normalized, continue to monitor weekly.  14. UTI: symptoms resolved.  15. Chest congestion: chest PT ordered. Resolved with discontinuation of TF 16. Obesity: BMI 32-->33.45-->32.81-->32.39: provide dietary education, TF discontinued discussed with patient and husband, may discontinue daily weights 17. Hemorrhoid: witch hazel pads ordered. Discussed that softening the stool will help to prevent straining and prevent new hemorrhoids from forming.  18. Gross hematuria: continue to hold lovenox, improved.  19. Constipation: discontinue senna-docusate  20. Small area of rash L face  -possibly minor contact dermatitis, avoid new soaps or orther possible irritants, continue to monitor for now, appears to be improving 21. Suboptimal vitamin D level: start ergocalciferol 50,000U once per weel for 7 weeks.  22. Suboptimal potassium: continue klor 91mq daily. Level was 4.2 on 10/11  LOS: 17 days A FACE TO FACE EVALUATION WAS PERFORMED  Nikelle Malatesta P Aliannah Holstrom 04/23/2022, 1:35 PM

## 2022-04-23 NOTE — Progress Notes (Addendum)
Occupational Therapy Session Note  Patient Details  Name: Elaine Ford MRN: [id number] Date of Birth: 03-Aug-1945  Today's Date: 04/23/2022 OT Individual Time: 2947-6546 and 5035-4656 OT Individual Time Calculation (min): 45 min  and 42 min   Short Term Goals: Week 2:  OT Short Term Goal 1 (Week 2): Patient will complete squat pivot transfer with contact guard assist OT Short Term Goal 2 (Week 2): Patient will bathe upper body with min assist OT Short Term Goal 3 (Week 2): Patient will dress upper body with mod assist OT Short Term Goal 4 (Week 2): Patient will bathe and dress lower body with mod assist OT Short Term Goal 5 (Week 2): Patient will complete toilet hygiene with min assist  Skilled Therapeutic Interventions/Progress Updates:    AM Session:  Patient received seated in wheelchair beginning breakfast with husband present. Patient able to feed herself breakfast with encouragement.  Worked on use of RUE for drinking from cup.  Patient without any appreciable movement in RUE until functional object placed in hand - then notice increase in postural control, and increased tension in RUE.  Hand to mouth pattern is developing.  Patient requires hand over hand assist for feeding with utensil - e.g. fork.   Patient agreeable to wash at sink and put on clean clothing.  Patient showing improved attention to right side of body in both doffing and donning clothes this am.  Working on use of RUE to wash LUE,  and tops of thighs.  Patient still limited in reaching to lower legs and feet for LB dressing.  Husband aware - and indicates she needed assist at times with socks/shoes prior.   Patient left up in wheelchair with husband.    PM Session:  [facility] with patient to ADL apartment with RW and min assist.  Husband following behind with wheelchair.  Patient recognizing loss of balance and stopping to make postural adjustments!  Daughter and son in law here visiting, and saw patient walking in  hallway.  POracticed tub transfer using tub trasnfer bench.  Attempted to walk without walker splint - but with built up handle on right grip of walker.  Patient able to safely take 3-4 steps before hand would roll off grip.  Not yet ready to use walker without splint.  Ambulated with patient back to room without walker with mod/min assist.  Patient offered opportunities to rest, but on each occasion opted to continue walking.  Patient left in room up in recliner with family at her side.    Therapy Documentation Precautions:  Precautions Precautions: Fall Precaution Comments: R hemi, apraxic Restrictions Weight Bearing Restrictions: No Pain:  Denies pain    Therapy/Group: Individual Therapy  Mariah Milling 04/23/2022, 12:50 PM

## 2022-04-23 NOTE — Progress Notes (Signed)
Speech Language Pathology Daily Session Note  Patient Details  Name: MARGARETHE VIRGEN MRN: [id number] Date of Birth: 03/11/1946  Today's Date: 04/23/2022 SLP Individual Time: 7412-8786 SLP Individual Time Calculation (min): 30 min  Short Term Goals: Week 1: SLP Short Term Goal 1 (Week 1): Pt will demonstrate tolerance of D1 and nectar-thick liquid consistencies with minimal s/sx concerning for aspiration given Mod A cues for implementation of aspiration precautions. SLP Short Term Goal 2 (Week 1): Pt will receptively ID common objects in field of 2 (vertical presentation) with 80% accuracy given Max A multimodal cues. SLP Short Term Goal 3 (Week 1): Pt will respond to basic and personal yes/no questions verbally or via alternate means with 80% accuracy given Max A multimodal cues. SLP Short Term Goal 4 (Week 1): Pt will attend to functional and therapeutic tasks for 5 minutes given Mod A. SLP Short Term Goal 5 (Week 1): Pt will utilize AAC (low-tech picture board, gestures) to communicate wants/needs given Max A multimodal cues SLP Short Term Goal 6 (Week 1): Pt will demonstrate orientation x 4 via multimodal communication given Max A multinodal cues.  Skilled Therapeutic Interventions: Pt seen for skilled ST with focus on speech and swallow goals, husband present to assist with translation. SLP facilitating snack of Dys 3/thin for further education and training on use of swallow precautions as trained. Pt tolerating thin liquids via straw with one episode delayed, weak throat clear, no change in vocal quality. Pt with min oral stasis with Dys 3, effectively cleared with cued liquid wash. Reviewed standard swallow precautions with husband who verbalized understanding. SLP facilitating simple communication task by providing overall max A cues for accuracy. Husband reports patient's speech much improved yesterday, able to hold appropriate and effective conversations with family. However today he states  her speech is more aphasic and confused. Discussed stroke recovery and inconsistencies in performance. Pt left in recliner with husband present, cont ST POC.   Pain Pain Assessment Pain Scale: 0-10 Pain Score: 0-No pain  Therapy/Group: Individual Therapy  Dewaine Conger 04/23/2022, 3:03 PM

## 2022-04-24 MED ORDER — PANTOPRAZOLE SODIUM 40 MG PO TBEC
40.0000 mg | DELAYED_RELEASE_TABLET | Freq: Every day | ORAL | Status: DC
  Administered 2022-04-25 – 2022-04-26 (×2): 40 mg via ORAL
  Filled 2022-04-24 (×2): qty 1

## 2022-04-24 MED ORDER — KETOTIFEN FUMARATE 0.035 % OP SOLN
1.0000 [drp] | Freq: Two times a day (BID) | OPHTHALMIC | Status: DC
  Administered 2022-04-24 – 2022-04-27 (×6): 1 [drp] via OPHTHALMIC

## 2022-04-24 NOTE — Progress Notes (Signed)
PROGRESS NOTE   Subjective/Complaints: No new complaints this morning  She slept well last night Husband is at bedside Discussed that I have sent message to [facility] to assist with script for stairlift  ROS: +pain in right thumb and wrist, constipation has improved, +rash on left lower side of mouth- improved  Objective:   DG Swallowing Func-Speech Pathology  Result Date: 04/22/2022 Table formatting from the original result was not included. Objective Swallowing Evaluation: Type of Study: MBS-Modified Barium Swallow Study  Patient Details Name: Elaine Ford MRN: [id number] Date of Birth: Mar 26, 1946 Today's Date: 04/22/2022 Time: SLP Start Time (ACUTE ONLY): 42 -SLP Stop Time (ACUTE ONLY): 1107 SLP Time Calculation (min) (ACUTE ONLY): 27 min Past Medical History: Past Medical History: Diagnosis Date  Back pain   Broken arm   age 76 years old broke right arm after a fall  Gall bladder stones   Gestational diabetes   Hemorrhoid   Hx of cardiac cath   [facility] 07/24/14 at [facility] - no significant CAD  Hx of cardiovascular stress test   Myoview 11/14:  No ischemia  Hx of echocardiogram   Echo 07/23/14 - at [facility]:  EF 65-70%, impaired relaxation, mild LAE, mild MR, mild AI, mild elevated PASP (44.2 mmHg)  Hyperlipidemia   Hypertension   LBBB (left bundle branch block)   Low blood pressure   Nerve damage   right arm  Pain   Left upper chest s/p pacer maker insertion site 07/2014  S/P cardiac pacemaker procedure 07/2014  [employer] - Dr. Lovena Le  [facility])   patient was unaware noted prior to pacermaker placement unknown when it occurred Past Surgical History: Past Surgical History: Procedure Laterality Date  COLONOSCOPY    DILATATION & CURETTAGE/HYSTEROSCOPY WITH MYOSURE N/A 02/29/2016  Procedure: DILATATION & CURETTAGE/HYSTEROSCOPY WITH MYOSURE;  Surgeon: [name], DO;  Location: [place] [facility];  Service: Gynecology;  Laterality: N/A;  patient has a pacemaker    GALLBLADDER SURGERY    IR 3D INDEPENDENT WKST  03/28/2022  IR PERCUTANEOUS ART THROMBECTOMY/INFUSION INTRACRANIAL INC DIAG ANGIO  03/28/2022  IR US GUIDE VASC ACCESS RIGHT  03/28/2022  LAPAROSCOPIC BILATERAL SALPINGO OOPHERECTOMY  04/20/2016  Procedure: LAPAROSCOPIC BILATERAL SALPINGO OOPHORECTOMY;  Surgeon: [name], DO;  Location: [place] [facility];  Service: Gynecology;;  LEAD REVISION N/A 07/25/2014  Procedure: LEAD REVISION;  Surgeon: Deboraha Sprang, MD;  Location: [facility] CATH LAB;  Service: Cardiovascular;  Laterality: N/A;  PERMANENT PACEMAKER INSERTION N/A 07/24/2014  Procedure: PERMANENT PACEMAKER INSERTION;  Surgeon: [name], MD;  Location: [facility] CATH LAB;  Service: Cardiovascular;  Laterality: N/A;  RADIOLOGY WITH ANESTHESIA N/A 03/28/2022  Procedure: IR WITH ANESTHESIA;  Surgeon: Radiologist, Medication, MD;  Location: [place];  Service: Radiology;  Laterality: N/A;  VAGINAL HYSTERECTOMY  04/20/2016  Procedure: HYSTERECTOMY VAGINAL;  Surgeon: [name], DO;  Location: [place] [facility];  Service: Gynecology;; HPI: 9/18 admit for CVA.  CT of the head demonstrated hypodensity in the left MCA without hemorrhage.   Systemic TNK mechanical thrombectomy.  Post procedurally on vent to ICU. Postoperative CT did demonstrate small subarachnoid hemorrhage.Extubated 9/19. No MRI at this time.  No data recorded  Recommendations for follow up therapy are  one component of a multi-disciplinary discharge planning process, led by the attending physician.  Recommendations may be updated based on patient status, additional functional criteria and insurance authorization. Assessment / Plan / Recommendation   04/22/2022  11:46 AM Clinical Impressions Clinical Impression MBSS completed to assess pt's readiness for thin liquids given hx of silent aspiration on MBSS completed on 04/12/2022. Of note, pt with improved swallow function in comparison to most recent MBSS. This date, pt presents with functional deglutition with no evidence of penetration  nor aspiration across assessed consistencies (thin liquid through soft solids). Pt with slightly decreased pharyngeal peristalsis and variable swallow initiation that appeared determinate on volume; I suspect these findings were present prior to admission, and appear more consistent with presbyphagia. Given objective findings, recommend continuation of Dysphagia 3 textures and advancement of liquids to thin via cup or straw; however, pt appeared to demonstrate a more consistent swallow initiation with use of straw vs cup. Continue to recommend full supervision to ensure safety. Medications may be administered whole in puree. Findings and recommendations shared with pt, pt's spouse, and interdisciplinary team; all parties verbalized understanding and agreement.  SLP Visit Diagnosis Dysphagia, oropharyngeal phase (R13.12);Aphasia (R47.01);Cognitive communication deficit (R41.841) Impact on safety and function Mild aspiration risk     03/30/2022   2:00 PM Treatment Recommendations Treatment Recommendations Therapy as outlined in treatment plan below     04/22/2022  11:46 AM Prognosis Prognosis for Safe Diet Advancement Good Barriers to Reach Goals Cognitive deficits;Language deficits;Severity of deficits   04/22/2022  11:46 AM Diet Recommendations SLP Diet Recommendations Dysphagia 3 (Mech soft) solids;Thin liquid Liquid Administration via Straw;Cup Medication Administration Whole meds with puree Compensations Minimize environmental distractions;Slow rate;Small sips/bites Postural Changes Seated upright at 90 degrees     04/22/2022  11:46 AM Other Recommendations Oral Care Recommendations Oral care BID   03/30/2022   2:00 PM Frequency and Duration  Speech Therapy Frequency (ACUTE ONLY) min 2x/week Treatment Duration 2 weeks     04/22/2022  11:42 AM Oral Phase Oral Phase Impaired Oral - Honey Teaspoon NT Oral - Nectar Teaspoon NT Oral - Nectar Cup Premature spillage;Decreased bolus cohesion Oral - Nectar Straw NT Oral -  Thin Teaspoon NT Oral - Thin Cup Premature spillage;Decreased bolus cohesion Oral - Thin Straw Premature spillage;Decreased bolus cohesion Oral - Puree WFL Oral - Mech Soft WFL Oral - Regular NT Oral - Multi-Consistency NT Oral - Pill NT    04/22/2022  11:43 AM Pharyngeal Phase Pharyngeal Phase Impaired Pharyngeal- Honey Teaspoon NT Pharyngeal- Nectar Teaspoon NT Pharyngeal- Nectar Cup Delayed swallow initiation-vallecula;Delayed swallow initiation-pyriform sinuses;Reduced pharyngeal peristalsis;Pharyngeal residue - valleculae;Pharyngeal residue - pyriform Pharyngeal Material does not enter airway Pharyngeal- Nectar Straw NT Pharyngeal- Thin Teaspoon NT Pharyngeal- Thin Cup Delayed swallow initiation-vallecula;Reduced pharyngeal peristalsis;Pharyngeal residue - valleculae;Pharyngeal residue - pyriform Pharyngeal Material does not enter airway Pharyngeal- Thin Straw Delayed swallow initiation-vallecula;Reduced pharyngeal peristalsis;Pharyngeal residue - valleculae;Pharyngeal residue - pyriform Pharyngeal Material does not enter airway Pharyngeal- Puree WFL Pharyngeal Material does not enter airway Pharyngeal- Mechanical Soft WFL Pharyngeal Material does not enter airway Pharyngeal- Regular NT Pharyngeal- Multi-consistency NT Pharyngeal- Pill NT    04/22/2022  11:46 AM Cervical Esophageal Phase  Cervical Esophageal Phase WFL Bethany A Lutes 04/22/2022, 11:56 AM                      No results for input(s): "WBC", "HGB", "HCT", "PLT" in the last 72 hours.  No results for input(s): "NA", "K", "CL", "CO2", "GLUCOSE", "  BUN", "CREATININE", "CALCIUM" in the last 72 hours.    Intake/Output Summary (Last 24 hours) at 04/24/2022 0851 Last data filed at 04/24/2022 0847 Gross per 24 hour  Intake 600 ml  Output --  Net 600 ml         Physical Exam: Vital Signs Blood pressure (!) 156/55, pulse 63, temperature 98.3 F (36.8 C), resp. rate 20, height '5\' 4"'$  (1.626 m), weight 85.6 kg, SpO2 95 %. Vitals and  nursing note reviewed. BMI 32.39 Constitutional:      Comments:  NAD, in bed, cortrak has been removed Neurological:     Mental Status: drowsy but easily awakened    Comments: Right inattention with decrease in EOMI to the right. Right facial weakness. Receptive> expressive deficits with delay in processing. + perseverative echolaly. Occasional social utterance. Able to follow simple motor commands with visual and tactile cues.     General: No acute distress Mood and affect are appropriate Heart: Regular rate and rhythm no rubs murmurs or extra sounds Lungs: CTAB, breathing unlabored, no rales or wheezes.  Abdomen: Positive bowel sounds, no longer TTP lower abdomen, nondistended Extremities: No clubbing, cyanosis, or edema. Moving LUE antigravity; RUE no appreciable movement in bed.  Skin: No evidence of breakdown, small area of rash left face inferior and lateral to mouth- a little improved today, chapped lips Neurologic:  Improved right sided attention Trace finger flexin RUE o/w 0/5 3- Right HF, KE, 2- ankle PT/DF Sensory exam reduced light touch LEFT upper and lower extremities Musculoskeletal: no pain with  range of motion in all 4 extremities. No joint swelling Ambulating MinA with RW, ambulating stairs with assist Performing stair training MinA to answer yes and no questions   Assessment/Plan: 1. Functional deficits which require 3+ hours per day of interdisciplinary therapy in a comprehensive inpatient rehab setting. Physiatrist is providing close team supervision and 24 hour management of active medical problems listed below. Physiatrist and rehab team continue to assess barriers to discharge/monitor patient progress toward functional and medical goals  Care Tool:  Bathing    Body parts bathed by patient: Right arm, Chest, Abdomen, Face, Right upper leg, Left upper leg, Front perineal area, Right lower leg, Left lower leg, Left arm, Buttocks   Body parts bathed by helper:  Right lower leg, Left lower leg, Buttocks, Left arm Body parts n/a: Buttocks   Bathing assist Assist Level: Minimal Assistance - Patient > 75%     Upper Body Dressing/Undressing Upper body dressing   What is the patient wearing?: Button up shirt    Upper body assist Assist Level: Moderate Assistance - Patient 50 - 74%    Lower Body Dressing/Undressing Lower body dressing      What is the patient wearing?: Pants, Incontinence brief     Lower body assist Assist for lower body dressing: Moderate Assistance - Patient 50 - 74%     Toileting Toileting Toileting Activity did not occur (Clothing management and hygiene only): N/A (no void or bm)  Toileting assist Assist for toileting: Moderate Assistance - Patient 50 - 74%     Transfers Chair/bed transfer  Transfers assist  Chair/bed transfer activity did not occur: Safety/medical concerns  Chair/bed transfer assist level: Moderate Assistance - Patient 50 - 74%     Locomotion Ambulation   Ambulation assist      Assist level: Moderate Assistance - Patient 50 - 74% Assistive device: Hand held assist Max distance: 20   Walk 10 feet activity   Assist  Walk 10 feet activity did not occur: Safety/medical concerns  Assist level: Moderate Assistance - Patient - 50 - 74% Assistive device: Hand held assist   Walk 50 feet activity   Assist Walk 50 feet with 2 turns activity did not occur: Safety/medical concerns         Walk 150 feet activity   Assist Walk 150 feet activity did not occur: Safety/medical concerns         Walk 10 feet on uneven surface  activity   Assist Walk 10 feet on uneven surfaces activity did not occur: Safety/medical concerns         Wheelchair     Assist Is the patient using a wheelchair?: Yes Type of Wheelchair: Manual    Wheelchair assist level: Dependent - Patient 0% Max wheelchair distance: 26f    Wheelchair 50 feet with 2 turns activity    Assist         Assist Level: Dependent - Patient 0%   Wheelchair 150 feet activity     Assist      Assist Level: Dependent - Patient 0%   Blood pressure (!) 156/55, pulse 63, temperature 98.3 F (36.8 C), resp. rate 20, height '5\' 4"'$  (1.626 m), weight 85.6 kg, SpO2 95 %.    Medical Problem List and Plan: 1. Functional deficits secondary to Left MCA infarct             -patient may  shower             -ELOS/Goals: 14-16d MinA  Start vitamin B/C supplement  Continue CIR  Discussed nutrition and exercise plan for home for stroke recovery and prevention  Messaged Becky regarding helping husband with script for stairlift on Monday 2.  Antithrombotics: -DVT/anticoagulation:  Mechanical: Sequential compression devices, below knee Bilateral lower extremities             -antiplatelet therapy: DAPT X 3 weeks followed by ASA alone.  3. Pain Management:  Tylenol prn.  4. Mood/Behavior/Sleep: LCSW to follow for evaluation and support.              -antipsychotic agents: N/A 5. Neuropsych/cognition: This patient is not fully capable of making decisions on her own behalf. 6. Skin/Wound Care: Routine pressure relief measures.  7. Fluids/Electrolytes/Nutrition: Monitor I/O. 8.  HTN: Monitor BP TID--continue coreg with BP goal <160. Currently patient is at goal. Continue amlodipine '5mg'$ . Provided list of foods to help with blood pressure. Continue magnesium gluconate '500mg'$  HS 9. Dysphagia: Advance to D3/thins. Asking nursing to provide her with a water chart so we can ensure she is drinking 6-8 glasses of water per day Diet Orders (From admission, onward)     Start     Ordered   04/22/22 1217  DIET DYS 3 Room service appropriate? Yes with Assist; Fluid consistency: Thin  Diet effective 0500       Comments: Add extra sauces and gravy  Question Answer Comment  Room service appropriate? Yes with Assist   Fluid consistency: Thin      04/22/22 1217            10. Hyperglycemia: Hgb A1C-5.5.  Discontinue CBGs.Provided list of foods to help with elevated blood sugars. Discussed metformin as an option but patient and husband prefer dietary/exercise strategies first. Discussed the benefits of walking after meals.  No results for input(s): "GLUCAP" in the last 72 hours.   11 L-BBB s/p PPM: Brief episode of A flutter. Continue to monitor. Continue magnesium gluconate '500mg'$   HS 12. Hyperlipidemia: continue Lipitor 13.  ABLA: normalized, continue to monitor weekly.  14. UTI: symptoms resolved.  15. Chest congestion: chest PT ordered. Resolved with discontinuation of TF 16. Obesity: BMI 32-->33.45-->32.81-->32.39: provide dietary education, TF discontinued discussed with patient and husband, may discontinue daily weights 17. Hemorrhoid: witch hazel pads ordered. Discussed that softening the stool will help to prevent straining and prevent new hemorrhoids from forming.  18. Gross hematuria: resolved, but no need to resume Lovenox since ambulating to and from apartment 19. Constipation: discontinue senna-docusate  20. Small area of rash L face  -possibly minor contact dermatitis, avoid new soaps or orther possible irritants, continue to monitor for now, appears to be improving 21. Suboptimal vitamin D level: start ergocalciferol 50,000U once per weel for 7 weeks.  22. Suboptimal potassium: continue klor 18mq daily. Level was 4.2 on 10/11, recheck potassium on Monday.   LOS: 18 days A FACE TO FACE EVALUATION WAS PERFORMED  KClide DeutscherRaulkar 04/24/2022, 8:51 AM

## 2022-04-25 DIAGNOSIS — K649 Unspecified hemorrhoids: Secondary | ICD-10-CM

## 2022-04-25 DIAGNOSIS — R1310 Dysphagia, unspecified: Secondary | ICD-10-CM

## 2022-04-25 DIAGNOSIS — R739 Hyperglycemia, unspecified: Secondary | ICD-10-CM

## 2022-04-25 DIAGNOSIS — H5789 Other specified disorders of eye and adnexa: Secondary | ICD-10-CM

## 2022-04-25 DIAGNOSIS — R21 Rash and other nonspecific skin eruption: Secondary | ICD-10-CM

## 2022-04-25 DIAGNOSIS — E876 Hypokalemia: Secondary | ICD-10-CM

## 2022-04-25 LAB — BASIC METABOLIC PANEL
Anion gap: 12 (ref 5–15)
BUN: 19 mg/dL (ref 8–23)
CO2: 24 mmol/L (ref 22–32)
Calcium: 9.5 mg/dL (ref 8.9–10.3)
Chloride: 105 mmol/L (ref 98–111)
Creatinine, Ser: 0.78 mg/dL (ref 0.44–1.00)
GFR, Estimated: >60 mL/min (ref >60)
Glucose, Bld: 95 mg/dL (ref 70–99)
Potassium: 3.7 mmol/L (ref 3.5–5.1)
Sodium: 141 mmol/L (ref 135–145)

## 2022-04-25 NOTE — Progress Notes (Signed)
Patient ID: Elaine Ford, female   DOB: [birth year], 76 y.o.   MRN: [id number]  Met with pt and husband to confirm equipment wheelchair transfer bench and rolling walker. Husband is aware rolling walker will be private pay and is in agreement with this. Have made referral to [employer] for above mentioned equipment. Have also given him the prescription faxed for his stair lift and to get the taxes taken off. Husband has the original for his file. Continue with education in preparation for discharge Wednesday

## 2022-04-25 NOTE — Progress Notes (Signed)
Physical Therapy Session Note  Patient Details  Name: Elaine Ford MRN: [id number] Date of Birth: 1946/05/16  Today's Date: 04/25/2022 PT Individual Time: 1100-1157 + 1445-1530 PT Individual Time Calculation (min): 57 min + 45 min  Short Term Goals: Week 3:  PT Short Term Goal 1 (Week 3): STG = LTG due to ELOS  Skilled Therapeutic Interventions/Progress Updates:     1st session: Pt sitting in recliner to start with her husband at bedside. Husband reports patient has toileted since 0630. Encouraged patient to mobilize to bathroom to avoid accidents.   Husband assisted patient in ambulating to the bathroom with HHA and no AD - PT providing close SBA for safety. Husband able to assist patient with 3/3 toileting tasks as patient continent of bladder. Husband then assisted patient with ambulatory transfer from bathroom to w/c with PT providing close SBA - cues for increasing R step length and safety awareness while turning to sit.   Pt transported in w/c to stairwell to simulate stairs to 2nd floor of their home. (Pt has 13 steps with 1 hand rail on L). Husband confirms that they will be getting a permanent stair lift installed at the end of this month. He plans on still taking her up the steps in the mean time.   Pt navigated 12 consecutive steps with minA from PT and then 12 consecutive steps down with minA from PT. Pt using a L hand rail ascent and then L hand rail for descent - mod cues for sequencing and safety. Educated husband on guarding and cueing for patient. Husband then completed the same stairs with PT providing CGA for added safety. Husband demonstrating safe guarding and appropriate cueing to manage patient safely.   Transported in w/c to rehab gym. Stand step transfer with CGA/minA and HHA to mat table - continued cues for increasing awareness of R step length. 1x15 repeated sit<>stands with no UE support to challenge strengthening and loading of RLE.   Gait training with  lateral, forward, and backward gait training - minA overall from PT with HHA and no AD - difficulty with lateral stepping R and backward stepping. All completed with 59f distances. Pt with fair carryover into functional transfer with stand step.   Pt returned husband to her room - all needs met. Pt/husband informed of upcoming therapy schedule.   2nd session: Pt sitting in recliner to start - husband at bedside. Pt agreeable to therapy and denies pain.  Husband assist with [place]ulatory [place] with PT providing supervision for safety. Husband continues to demonstrate safe guarding and ability to instruct patient well.  Transported pt in w/c to ADL apartment. Practiced gait training on carpeted surfaces and furniture transfers from low sitting sofa/recliner's. Pt required minA with HHA in ADL apartment and minA for furniture transfers due to height and soft compliant surface.  Gait training with the husband providing HHA and minA with PT providing close SBA for safety. Husband ambulated patient from ADL apartment to main rehab gym, ~653f Cues throughout for increasing R step length and widening BOS. Pt continues to lean to her R despite max multi cues.   Worked on stepping strategies using 4-square step test with HHA from husband and min/modA from PT for added safety. Pt completed multiple series of 4-square stepping for forwards/sideways/backwards stepping. Max cues for increasing R step length/height with inconsistent performances. modA overall for balance and steadying while completing this activity with intermittent seated rest breaks for recovery.  Returned patient  to her room and assisted to bed with minA transfer. MinA for sit>supine in hospital bed. Repositioned for comfort. All needs met, alarm on, pt made comfortable.     Therapy Documentation Precautions:  Precautions Precautions: Fall Precaution Comments: R hemi, apraxic Restrictions Weight Bearing Restrictions:  No General:      Therapy/Group: Individual Therapy  Alger Simons 04/25/2022, 7:35 AM

## 2022-04-25 NOTE — Progress Notes (Signed)
Speech Language Pathology Daily Session Note  Patient Details  Name: Elaine Ford MRN: [id number] Date of Birth: 07/11/1946  Today's Date: 04/25/2022 SLP Individual Time: 0950-1045 SLP Individual Time Calculation (min): 55 min  Short Term Goals: Week 3: SLP Short Term Goal 1 (Week 3): STG's = LTG's due to ELOS  Skilled Therapeutic Interventions: Skilled treatment session focused on communication goals. Upon arrival, patient appeared lethargic while upright in the recliner. Patient's husband present and provided assistance with translating. SLP facilitated session by providing binary choices for orientation X 4. Patient answered yes/no questions related to biographical and environmental information with 90% accuracy. Patient also answered basic open-ended questions related to biographical information with 65% accuracy when given binary choices.  Patient identified functional items from a field of 2 with 100% accuracy and required Max A multimodal cues to name functional items. Patient did best when given binary choices. Patient also independently named the function of the item with 50% accuracy. Patient consumed thin liquids via straw without the session without overt s/s of aspiration but required Min verbal cues for small sips and a slow rate. Recommend patient continue current diet. Patient left upright in recliner with husband present. Continue with current plan of care.      Pain No/Denies Pain   Therapy/Group: Individual Therapy  Shanequa Whitenight 04/25/2022, 2:06 PM

## 2022-04-25 NOTE — Progress Notes (Signed)
PROGRESS NOTE   Subjective/Complaints: No new complaints this AM. Continues to have R thumb and wrist pain.   ROS: +pain in right thumb and wrist, constipation has improved, +rash on left lower side of mouth- improved, denies SOB, Denies CP  Objective:   No results found.  No results for input(s): "WBC", "HGB", "HCT", "PLT" in the last 72 hours.  Recent Labs    04/25/22 0525  NA 141  K 3.7  CL 105  CO2 24  GLUCOSE 95  BUN 19  CREATININE 0.78  CALCIUM 9.5      Intake/Output Summary (Last 24 hours) at 04/25/2022 0940 Last data filed at 04/24/2022 1804 Gross per 24 hour  Intake 360 ml  Output --  Net 360 ml          Physical Exam: Vital Signs Blood pressure (!) 143/63, pulse 64, temperature 98.3 F (36.8 C), temperature source Oral, resp. rate 18, height '5\' 4"'$  (1.626 m), weight 85.6 kg, SpO2 97 %. Vitals and nursing note reviewed. BMI 32.39 Constitutional:      Comments:  NAD, R eye conjunctival redness Neurological:     Mental Status: awake    Comments: Right inattention with decrease in EOMI to the right. Right facial weakness. Receptive> expressive deficits with delay in processing. + perseverative echolaly. Occasional social utterance. Able to follow simple motor commands with visual and tactile cues.    General: No acute distress Mood and affect are appropriate Heart: Regular rate and rhythm no rubs murmurs or extra sounds Lungs: CTAB, breathing unlabored, no rales or wheezes.  Abdomen: Positive bowel sounds, no longer TTP lower abdomen, nondistended Extremities: No clubbing, cyanosis, or edema. Moving LUE antigravity; RUE no appreciable movement in bed.  Skin: No evidence of breakdown, small area of rash left face inferior and lateral to mouth- a little improved today, chapped lips Neurologic:  Improved right sided attention Trace finger flexin RUE o/w 0/5 3- Right HF, KE, 2- ankle PT/DF Sensory  exam reduced light touch LEFT upper and lower extremities Musculoskeletal: no pain with  range of motion in all 4 extremities. No joint swelling Ambulating MinA with RW, ambulating stairs with assist Performing stair training MinA to answer yes and no questions Tenderness to R thumb and wrist with palpation   Assessment/Plan: 1. Functional deficits which require 3+ hours per day of interdisciplinary therapy in a comprehensive inpatient rehab setting. Physiatrist is providing close team supervision and 24 hour management of active medical problems listed below. Physiatrist and rehab team continue to assess barriers to discharge/monitor patient progress toward functional and medical goals  Care Tool:  Bathing    Body parts bathed by patient: Right arm, Chest, Abdomen, Face, Right upper leg, Left upper leg, Front perineal area, Right lower leg, Left lower leg, Left arm, Buttocks   Body parts bathed by helper: Right lower leg, Left lower leg, Buttocks, Left arm Body parts n/a: Buttocks   Bathing assist Assist Level: Minimal Assistance - Patient > 75%     Upper Body Dressing/Undressing Upper body dressing   What is the patient wearing?: Button up shirt    Upper body assist Assist Level: Moderate Assistance - Patient 50 -  74%    Lower Body Dressing/Undressing Lower body dressing      What is the patient wearing?: Pants, Incontinence brief     Lower body assist Assist for lower body dressing: Moderate Assistance - Patient 50 - 74%     Toileting Toileting Toileting Activity did not occur (Clothing management and hygiene only): N/A (no void or bm)  Toileting assist Assist for toileting: Moderate Assistance - Patient 50 - 74%     Transfers Chair/bed transfer  Transfers assist  Chair/bed transfer activity did not occur: Safety/medical concerns  Chair/bed transfer assist level: Moderate Assistance - Patient 50 - 74%     Locomotion Ambulation   Ambulation assist       Assist level: Moderate Assistance - Patient 50 - 74% Assistive device: Hand held assist Max distance: 20   Walk 10 feet activity   Assist  Walk 10 feet activity did not occur: Safety/medical concerns  Assist level: Moderate Assistance - Patient - 50 - 74% Assistive device: Hand held assist   Walk 50 feet activity   Assist Walk 50 feet with 2 turns activity did not occur: Safety/medical concerns         Walk 150 feet activity   Assist Walk 150 feet activity did not occur: Safety/medical concerns         Walk 10 feet on uneven surface  activity   Assist Walk 10 feet on uneven surfaces activity did not occur: Safety/medical concerns         Wheelchair     Assist Is the patient using a wheelchair?: Yes Type of Wheelchair: Manual    Wheelchair assist level: Dependent - Patient 0% Max wheelchair distance: 64f    Wheelchair 50 feet with 2 turns activity    Assist        Assist Level: Dependent - Patient 0%   Wheelchair 150 feet activity     Assist      Assist Level: Dependent - Patient 0%   Blood pressure (!) 143/63, pulse 64, temperature 98.3 F (36.8 C), temperature source Oral, resp. rate 18, height '5\' 4"'$  (1.626 m), weight 85.6 kg, SpO2 97 %.    Medical Problem List and Plan: 1. Functional deficits secondary to Left MCA infarct             -patient may  shower             -ELOS/Goals: 14-16d MinA  Start vitamin B/C supplement  Continue CIR  Discussed nutrition and exercise plan for home for stroke recovery and prevention  Messaged Becky regarding helping husband with script for stairlift on Monday 2.  Antithrombotics: -DVT/anticoagulation:  Mechanical: Sequential compression devices, below knee Bilateral lower extremities             -antiplatelet therapy: DAPT X 3 weeks followed by ASA alone.  3. Pain Management:  Tylenol prn.  4. Mood/Behavior/Sleep: LCSW to follow for evaluation and support.              -antipsychotic  agents: N/A 5. Neuropsych/cognition: This patient is not fully capable of making decisions on her own behalf. 6. Skin/Wound Care: Routine pressure relief measures.  7. Fluids/Electrolytes/Nutrition: Monitor I/O. 8.  HTN: Monitor BP TID--continue coreg with BP goal <160. Currently patient is at goal. Continue amlodipine '5mg'$ . Provided list of foods to help with blood pressure. Continue magnesium gluconate '500mg'$  HS -10/16 fairly well control, avoid hypotension, continue current regimen 9. Dysphagia: Advance to D3/thins. Asking nursing to provide her with a  water chart so we can ensure she is drinking 6-8 glasses of water per day   Diet Orders (From admission, onward)     Start     Ordered   04/22/22 1217  DIET DYS 3 Room service appropriate? Yes with Assist; Fluid consistency: Thin  Diet effective 0500       Comments: Add extra sauces and gravy  Question Answer Comment  Room service appropriate? Yes with Assist   Fluid consistency: Thin      04/22/22 1217            10. Hyperglycemia: Hgb A1C-5.5. Discontinue CBGs.Provided list of foods to help with elevated blood sugars. Discussed metformin as an option but patient and husband prefer dietary/exercise strategies first. Discussed the benefits of walking after meals.  No results for input(s): "GLUCAP" in the last 72 hours.   11 L-BBB s/p PPM: Brief episode of A flutter. Continue to monitor. Continue magnesium gluconate '500mg'$  HS 12. Hyperlipidemia: continue Lipitor 13.  ABLA: normalized, continue to monitor weekly.  14. UTI: symptoms resolved.  15. Chest congestion: chest PT ordered. Resolved with discontinuation of TF 16. Obesity: BMI 32-->33.45-->32.81-->32.39: provide dietary education, TF discontinued discussed with patient and husband, may discontinue daily weights 17. Hemorrhoid: witch hazel pads ordered. Discussed that softening the stool will help to prevent straining and prevent new hemorrhoids from forming.  18. Gross  hematuria: resolved, but no need to resume Lovenox since ambulating to and from apartment 19. Constipation: discontinue senna-docusate   -LBM 10/14- improved, continue to monitor 20. Small area of rash L face  -possibly minor contact dermatitis, avoid new soaps or orther possible irritants, continue to monitor for now, appears to be improving  -10/16 Continue Cetaphil cream 21. Suboptimal vitamin D level: start ergocalciferol 50,000U once per weel for 7 weeks.  22. Suboptimal potassium: continue klor 36mq daily. Level was 4.2 on 10/11, recheck potassium on Monday.  23. Conjunctival redness R eye  -Continue Ketotifin drops  LOS: 19 days A FACE TO FACE EVALUATION WAS PERFORMED  YJennye Boroughs10/16/2023, 9:40 AM

## 2022-04-25 NOTE — Progress Notes (Signed)
Patient and patients husband educated on S/S of a stoke. Understands to follow up with all scheduled appointments., do not stop taking medications abruptly. No concerns at this time regarding upcoming DC.

## 2022-04-25 NOTE — Progress Notes (Signed)
Occupational Therapy Session Note  Patient Details  Name: Elaine Ford MRN: [id number] Date of Birth: 29-Dec-1945  Today's Date: 04/25/2022 OT Individual Time: 0800-0900 OT Individual Time Calculation (min): 60 min    Short Term Goals: Week 2:  OT Short Term Goal 1 (Week 2): Patient will complete squat pivot transfer with contact guard assist OT Short Term Goal 2 (Week 2): Patient will bathe upper body with min assist OT Short Term Goal 3 (Week 2): Patient will dress upper body with mod assist OT Short Term Goal 4 (Week 2): Patient will bathe and dress lower body with mod assist OT Short Term Goal 5 (Week 2): Patient will complete toilet hygiene with min assist  Skilled Therapeutic Interventions/Progress Updates:    Patient received sitting up in recliner.  Patient agreeable to shower. Ambulated to shower with supervision to min assist without walker!  Patient removed pajama top without assistance for the first time, attending to RUE throughout task.!  Patient very focused today, excited that discharge is in sight.  Needing only intermittent assistance to bathe and dress herself.  Husband has been consistently assisting with transfers, toileting,   and oral care in preparation for discharge Wednesday.   Provided passive range to digits , thumb and wrist with gentle traction.  Educated and demonstrated for patient to complete self range of motion with thumb spica on.  Patient left up in wheelchair with husband.    Therapy Documentation Precautions:  Precautions Precautions: Fall Precaution Comments: R hemi, apraxic Restrictions Weight Bearing Restrictions: No  Pain: Pain Assessment Pain Score: 2  Thumb right  Therapy/Group: Individual Therapy  Mariah Milling 04/25/2022, 12:09 PM

## 2022-04-26 ENCOUNTER — Other Ambulatory Visit (HOSPITAL_COMMUNITY): Payer: Self-pay

## 2022-04-26 MED ORDER — VITAMIN D (ERGOCALCIFEROL) 1.25 MG (50000 UNIT) PO CAPS
50000.0000 [IU] | ORAL_CAPSULE | ORAL | 0 refills | Status: DC
  Filled 2022-04-26: qty 5, 35d supply, fill #0

## 2022-04-26 MED ORDER — ATORVASTATIN CALCIUM 40 MG PO TABS
40.0000 mg | ORAL_TABLET | Freq: Every day | ORAL | 0 refills | Status: DC
  Filled 2022-04-26: qty 30, 30d supply, fill #0

## 2022-04-26 MED ORDER — MELATONIN 3 MG PO TABS
3.0000 mg | ORAL_TABLET | Freq: Every evening | ORAL | 0 refills | Status: DC | PRN
  Filled 2022-04-26: qty 30, 30d supply, fill #0

## 2022-04-26 MED ORDER — CETAPHIL MOISTURIZING EX LOTN
TOPICAL_LOTION | Freq: Two times a day (BID) | CUTANEOUS | 0 refills | Status: AC
  Filled 2022-04-26: qty 236, fill #0

## 2022-04-26 MED ORDER — ASPIRIN 81 MG PO CHEW
81.0000 mg | CHEWABLE_TABLET | Freq: Every day | ORAL | 0 refills | Status: AC
  Filled 2022-04-26: qty 30, 30d supply, fill #0

## 2022-04-26 MED ORDER — MAGNESIUM OXIDE 400 MG PO TABS
500.0000 mg | ORAL_TABLET | Freq: Every day | ORAL | 0 refills | Status: DC
  Filled 2022-04-26: qty 30, 20d supply, fill #0

## 2022-04-26 MED ORDER — KETOTIFEN FUMARATE 0.035 % OP SOLN: 1.0000 [drp] | Freq: Two times a day (BID) | OPHTHALMIC | Status: DC

## 2022-04-26 MED ORDER — OMEPRAZOLE 20 MG PO CPDR
40.0000 mg | DELAYED_RELEASE_CAPSULE | Freq: Every day | ORAL | 0 refills | Status: DC
  Filled 2022-04-26: qty 60, 30d supply, fill #0

## 2022-04-26 MED ORDER — AMLODIPINE BESYLATE 5 MG PO TABS
5.0000 mg | ORAL_TABLET | Freq: Every day | ORAL | 0 refills | Status: DC
  Filled 2022-04-26: qty 30, 30d supply, fill #0

## 2022-04-26 MED ORDER — WITCH HAZEL-GLYCERIN EX PADS
MEDICATED_PAD | CUTANEOUS | 12 refills | Status: DC | PRN
  Filled 2022-04-26: qty 40, 10d supply, fill #0

## 2022-04-26 MED ORDER — CARVEDILOL 25 MG PO TABS
25.0000 mg | ORAL_TABLET | Freq: Two times a day (BID) | ORAL | 0 refills | Status: DC
  Filled 2022-04-26: qty 60, 30d supply, fill #0

## 2022-04-26 MED ORDER — IRON POLYSACCH CMPLX-B12-FA 150-0.025-1 MG PO CAPS
1.0000 | ORAL_CAPSULE | Freq: Every day | ORAL | 0 refills | Status: DC
  Filled 2022-04-26: qty 30, 30d supply, fill #0

## 2022-04-26 MED ORDER — SELENIUM SULFIDE 1 % EX LOTN
1.0000 | TOPICAL_LOTION | Freq: Every day | CUTANEOUS | 0 refills | Status: AC
  Filled 2022-04-26: qty 325, 325d supply, fill #0

## 2022-04-26 NOTE — Progress Notes (Signed)
Physical Therapy Discharge Summary  Patient Details  Name: Elaine Ford MRN: [id number] Date of Birth: 1945/08/01  Date of Discharge from PT service:April 26, 2022  Today's Date: 04/26/2022 PT Individual Time: 1100-1157 PT Individual Time Calculation (min): 57 min    Patient has met 5 of 9 long term goals due to improved activity tolerance, improved balance, improved postural control, increased strength, ability to compensate for deficits, functional use of  right lower extremity, improved attention, improved awareness, and improved coordination.  Patient to discharge at an ambulatory level [place].   Patient's care partner is independent to provide the necessary physical and cognitive assistance at discharge. Patient's husband, Cory Roughen, will be her primary caregiver and he was present for every therapy session during her rehab stay - he had plenty of hands on training for bed mobility, transfers, short distance gait, and stair training - demonstrated safe guarding and ability to assist patient adequately for safe DC home where he will provide 24/7 care.  Reasons goals not met: Dynamic standing, transfers, and ambulation goals set to CGA - pt requires minA for safety due to R sided weakness/apraxia.  Recommendation:  Patient will benefit from ongoing skilled PT services in home health setting to continue to advance safe functional mobility, address ongoing impairments in R sided weakness, dynamic standing balance, home safety, caregiver training, gait training with LRAD, and minimize fall risk.  Equipment: 20x16 wheelchair, RW. (Pt has adjustable bed with bed rails at home)  Reasons for discharge: treatment goals met and discharge from hospital  Patient/family agrees with progress made and goals achieved: Yes  PT Discharge Precautions/Restrictions Precautions Precautions: Fall Precaution Comments: R hemi, apraxic Restrictions Weight Bearing Restrictions: No Pain  Interference Pain Interference Pain Effect on Sleep: 1. Rarely or not at all Pain Interference with Therapy Activities: 1. Rarely or not at all Pain Interference with Day-to-Day Activities: 1. Rarely or not at all Vision/Perception  Vision - History Ability to See in Adequate Light: 0 Adequate Perception Perception: Impaired Inattention/Neglect: Does not attend to right side of body Praxis Praxis: Impaired Praxis Impairment Details: Perseveration;Motor planning Praxis-Other Comments: Verbal perseveration - much improved since date of evaluation  Cognition Overall Cognitive Status: Impaired/Different from baseline Arousal/Alertness: Awake/alert Orientation Level: Oriented X4 Attention: Focused;Sustained;Selective Focused Attention: Appears intact Sustained Attention: Appears intact Selective Attention: Impaired Selective Attention Impairment: Verbal basic;Functional basic Memory: Impaired Memory Impairment: Retrieval deficit;Decreased recall of new information;Decreased short term memory;Storage deficit Awareness: Impaired Awareness Impairment: Emergent impairment Problem Solving: Impaired Problem Solving Impairment: Functional basic;Verbal basic Behaviors: Perseveration (verbal - mild) Safety/Judgment: Impaired Comments: Decreased insight into deficits Sensation Sensation Light Touch: Impaired by gross assessment Hot/Cold: Impaired by gross assessment Proprioception: Impaired by gross assessment Stereognosis: Impaired by gross assessment Stereognosis Impaired Details: Impaired RLE;Impaired RUE Additional Comments: Difficult to assess due to aphasia, comprehension, and language barrier. Appears to have impaired sensation on R side Coordination Gross Motor Movements are Fluid and Coordinated: No Fine Motor Movements are Fluid and Coordinated: No Coordination and Movement Description: R hemi (UE > LE), apraxic Motor  Motor Motor: Hemiplegia;Motor apraxia Motor - Discharge  Observations: Significant improvement since DOE  Mobility Bed Mobility Bed Mobility: Supine to Sit;Sitting - Scoot to Edge of Bed;Sit to Supine Supine to Sit: Minimal Assistance - Patient > 75% Sit to Supine: Minimal Assistance - Patient > 75% Transfers Transfers: Sit to Stand;Stand to Sit;Stand Pivot Transfers Sit to Stand: Contact Guard/Touching assist Stand to Sit: Contact Guard/Touching assist Stand Pivot Transfers: Minimal Assistance - Patient >  75% Stand Pivot Transfer Details: Verbal cues for precautions/safety;Verbal cues for gait pattern;Verbal cues for sequencing;Verbal cues for technique;Manual facilitation for weight shifting;Manual facilitation for placement;Tactile cues for initiation;Tactile cues for posture Transfer (Assistive device): 1 person hand held assist Locomotion  Gait Ambulation: Yes Gait Assistance: Minimal Assistance - Patient > 75% Gait Distance (Feet): 150 Feet Assistive device: 1 person hand held assist Gait Assistance Details: Tactile cues for initiation;Tactile cues for sequencing;Tactile cues for weight shifting;Tactile cues for posture;Tactile cues for weight beaing;Verbal cues for precautions/safety;Verbal cues for gait pattern;Verbal cues for safe use of DME/AE;Verbal cues for sequencing;Visual cues/gestures for precautions/safety;Visual cues/gestures for sequencing;Manual facilitation for weight shifting;Manual facilitation for placement;Manual facilitation for weight bearing Gait Gait: Yes Gait Pattern: Impaired Gait Pattern: Decreased stance time - right;Step-to pattern;Decreased step length - right;Decreased step length - left;Decreased hip/knee flexion - right;Decreased weight shift to right;Poor foot clearance - right;Trunk flexed;Lateral trunk lean to right;Narrow base of support;Scissoring High Level Ambulation High Level Ambulation: Side stepping;Backwards walking Side Stepping: modA with HHA Backwards Walking: modA with HHA Stairs /  Additional Locomotion Stairs: Yes Stairs Assistance: Minimal Assistance - Patient > 75% Stair Management Technique: One rail Left;Step to pattern;Forwards Number of Stairs: 12 Height of Stairs: 6 Wheelchair Mobility Wheelchair Mobility: No  Trunk/Postural Assessment  Cervical Assessment Cervical Assessment: Exceptions to [facility] (slight forward head, limited cervical rotation bilaterally) Thoracic Assessment Thoracic Assessment: Exceptions to [facility] (rounded shoulders,) Lumbar Assessment Lumbar Assessment: Exceptions to [facility] (posterior pelvic tilt in sitting - flexible) Postural Control Postural Control: Deficits on evaluation Trunk Control: Trunk lean to the R in standing. Worsens with dynamic tasks Righting Reactions: delayed  and impaired Protective Responses: delayed and impaired  Balance Balance Balance Assessed: Yes Berg Balance Test Sit to Stand: Able to stand without using hands and stabilize independently Standing Unsupported: Able to stand 2 minutes with supervision Sitting with Back Unsupported but Feet Supported on Floor or Stool: Able to sit safely and securely 2 minutes Stand to Sit: Sits safely with minimal use of hands Transfers: Needs one person to assist Standing Unsupported with Eyes Closed: Able to stand 10 seconds with supervision Standing Ubsupported with Feet Together: Needs help to attain position but able to stand for 30 seconds with feet together From Standing, Reach Forward with Outstretched Arm: Reaches forward but needs supervision From Standing Position, Pick up Object from Floor: Unable to try/needs assist to keep balance From Standing Position, Turn to Look Behind Over each Shoulder: Needs assist to keep from losing balance and falling Turn 360 Degrees: Needs assistance while turning Standing Unsupported, Alternately Place Feet on Step/Stool: Needs assistance to keep from falling or unable to try Standing Unsupported, One Foot in Front: Loses balance while  stepping or standing Standing on One Leg: Unable to try or needs assist to prevent fall Total Score: 21 Static Sitting Balance Static Sitting - Balance Support: Feet supported;No upper extremity supported Static Sitting - Level of Assistance: 7: Independent Dynamic Sitting Balance Dynamic Sitting - Balance Support: Feet supported;No upper extremity supported Dynamic Sitting - Level of Assistance: 5: Stand by assistance Static Standing Balance Static Standing - Balance Support: No upper extremity supported Static Standing - Level of Assistance: 5: Stand by assistance Dynamic Standing Balance Dynamic Standing - Balance Support: No upper extremity supported;During functional activity Dynamic Standing - Level of Assistance: 4: Min assist Dynamic Standing - Balance Activities: [employer];Reaching for objects;Reaching across midline;Kicking ball;Forward lean/weight shifting;Lateral lean/weight shifting Extremity Assessment      RLE Assessment RLE Assessment:  Exceptions to [facility] RLE Strength RLE Overall Strength: Deficits Right Hip Flexion: 3+/5 Right Knee Flexion: 4-/5 Right Knee Extension: 4-/5 Right Ankle Dorsiflexion: 4/5 Right Ankle Plantar Flexion: 4-/5 LLE Assessment LLE Assessment: Within Functional Limits  Skilled Intervention: Pt sitting in w/c on arrival. Husband at bedside. Educated patient and husband on home safety, fall prevention strategies, energy conservation, DME rec's, and role of f/u therapy services. Reviewed DC planning, PT goals, and continued therapy services at DC. Sensory and MMT testing as listed above - apraxia and comprehension impacting both testing. Unable to discern level of sensory but appears to lack some sensation on her R side. Patient reports difficulty with word finding (not new since her CVA) and aphasia. All questions/concerns addressed and both report confidence regarding DC plan.  Gait training with husband and PT providing close SBA to intermittent  CGA for added safety. Pt completes sit<>stand from w/c with CGA. Husband providing HHA/minA and pt ambulated ~22f (!!!) with gait deteriorating after ~1016f Educated husband on how to assist with lateral weight shifting to the L while she steps with the R which had good carryover. Pt pleased with her ability to ambulate!   Pt able to stand up and give PT a hug goodbye at the end of the session with good balance and no knee buckling. All needs met at end of session.  HEP [place]  Access Code: [email or web address] URL: [email or web address] Date: 04/26/2022 Prepared by: [place] [employer] [place] Counter Support  - 1 x daily - 7 x weekly - 3 sets - 10 reps - Standing March with Counter Support  - 1 x daily - 7 x weekly - 3 sets - 10 reps - Standing Hip Abduction with Counter Support  - 1 x daily - 7 x weekly - 3 sets - 10 reps - Standing Knee Flexion with Counter Support  - 1 x daily - 7 x weekly - 3 sets - 10 reps - Sit to Stand with Counter Support  - 1 x daily - 7 x weekly - 3 sets - 10 reps - Standing Heel Raise with Support  - 1 x daily - 7 x weekly - 3 sets - 10 reps - Romberg Stance  - 1 x daily - 7 x weekly - 3 sets - 10 reps - Wide Stance with Eyes Closed  - 1 x daily - 7 x weekly - 3 sets - 10 reps - Seated Long Arc Quad  - 1 x daily - 7 x weekly - 3 sets - 10 reps - Seated Quad Set  - 1 x daily - 7 x weekly - 3 sets - 10 reps - Seated March with Ankle Weights at Foot  - 1 x daily - 7 x weekly - 3 sets - 10 reps   ChMarriottT, DPT 04/26/2022, 7:54 AM

## 2022-04-26 NOTE — Progress Notes (Signed)
Inpatient Rehabilitation Care Coordinator Discharge Note   Patient Details  Name: Elaine Ford MRN: [id number] Date of Birth: [birth year]   Discharge location: HOME WITH HUSBAND WHO CAN PROVIDE 24/7 CARE  Length of Stay: 21 DAYS  Discharge activity level: CGA-MIN ASSIST  Home/community participation: ACTIVE  Patient response QG:BEEFEO Literacy - How often do you need to have someone help you when you read instructions, pamphlets, or other written material from your doctor or pharmacy?: Rarely  Patient response FH:QRFXJO Isolation - How often do you feel lonely or isolated from those around you?: Rarely  Services provided included: MD, RD, PT, OT, SLP, RN, CM, TR, Pharmacy, SW  Financial Services:  Charity fundraiser Utilized: Environmental education officer MEDICARE  Choices offered to/list presented to: PT AND HUSBAND  Follow-up services arranged:  Home Health, DME, Patient/Family has no preference for HH/DME agencies [place]: [place]    DME : [facility] [place] TO HUSBAND    Patient response to transportation need: Is the patient able to respond to transportation needs?: Yes In the past 12 months, has lack of transportation kept you from medical appointments or from getting medications?: No In the past 12 months, has lack of transportation kept you from meetings, work, or from getting things needed for daily living?: No    Comments (or additional information):PT DID WELL AND REACHED HER GOALS. HUSBAND AND SON WERE HERE EVERYDAY TO OBSERVE AND PARTICIPATE IN THERAPIES WITH HER. HUSBAND FEELS PREPARED FOR DC HOME  Patient/Family verbalized understanding of follow-up arrangements:  Yes  Individual responsible for coordination of the follow-up plan: JORGE-HUSBAND  [phone]  Confirmed correct DME delivered: Elease Hashimoto 04/26/2022    Elease Hashimoto

## 2022-04-26 NOTE — Progress Notes (Signed)
Speech Language Pathology Discharge Summary  Patient Details  Name: Elaine Ford MRN: [id number] Date of Birth: 09/20/1945  Date of Discharge from [place] [facility], 2023  Today's Date: 04/26/2022 SLP Individual Time: 0930-1015 (2nd session: 1220-1245 - 25 minutes) SLP Individual Time Calculation (min): 45 min   Skilled Therapeutic Interventions:   S: Pt seen this date for skilled ST intervention targeting swallowing and communication goals outlined in care plan. Pt received awake/alert and OOB in w/c. Supportive spouse at bedside and providing translation PRN. Pt communicating mostly in [place]. Agreeable to ST intervention in hospital room.  O: SLP facilitated today's session by providing: Session 1: - Max A for confrontational naming - Max A, faded to Mod-Max A verbal and visual cues to achieve 100% accuracy during picture-to-word matching task. - Max A, faded to Mod A verbal and visual cues for verbal completion of opposites given short phrase (e.g. "up and ______."). - Max A for orientation to date and Mod A for orientation to location and situation (intellectual awareness) given written cues and binary verbal choice prompts. - Min A for basic auditory comprehension during conversational task. - Pt and family education re: home exercise program, recommendations for ongoing therapy via [place], and ways to encourage increased MLU and vocabulary use at home.  Session 2: - Mod A for naming given binary choice prompts. - Sup to Min A for adherence to aspiration precautions with intake of Dysphagia 3 and thin liquid via straw - no clinical s/sx concerning for airway compromise with VSS, per chart review. - Pt and family education re: Dysphagia 3 diet textures, aspiration precautions, naming hierarchy cues, aphasia + phonemic and semantic paraphasias, functional exercises to address expressive language impairment at home, and supportive communication interventions to include providing  binary verbal choice prompts, visual supports/cues, phonemic cues, and providing extra time; pt's spouse has verbalized and demonstrated understanding of above mentioned techniques.  A: Pt to d/c from [place], this date, in anticipation of d/c from [facility] on 10/18. Recommend 24/7 supervision and assistance and HH ST at time of d/c.  P: Pt left in room with all safety measures activated. Call bell reviewed and within reach and all immediate needs met. Spouse present. Continue per current ST POC next session.    Patient has met 10 of 10 long term goals.  Patient to discharge at overall Min (Max A for naming) level.  Reasons goals not met: N/A   Clinical Impression/Discharge Summary:    Pt has demonstrated functional and steady progress during this CIR admission, as evident by meeting all long-term goals outlined in care plan. Pt and family education has been completed. 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 Cognitive impairments are also noted, though were not formally addressed during this admission due to severity of language impairment. During initial evaluation, pt required Max  to Total A for auditory comprehension and verbal expression, with pt now requiring overall Min A for basic auditory comprehension and verbal expression of basic wants and needs; continues to require Max A for naming tasks. Tolerating Dysphagia 3 textures and thin liquids via straw with no clinical s/sx concerning for airway compromise given Sup to Min A for adherence to general aspiration precautions (small + single bites/sips, alternating bites/sips, and slow rate). Medications may be administered whole in puree. Recommend 24/7 supervision and assistance, in addition to ongoing ST intervention at time of d/c; pt and husband verbalized understanding  and are in agreement with recommendations.   Care Partner:  Caregiver Able  to Provide Assistance: Yes  Type of Caregiver Assistance: Physical;Cognitive  Recommendation:  Home Health SLP;24 hour supervision/assistance  Rationale for SLP Follow Up: Maximize functional communication;Maximize cognitive function and independence;Maximize swallowing safety;Reduce caregiver burden   Equipment: N/A   Reasons for discharge: Discharged from hospital   Patient/Family Agrees with Progress Made and Goals Achieved: Yes    Lorana Maffeo A Selvin Yun 04/26/2022, 1:22 PM

## 2022-04-26 NOTE — Plan of Care (Signed)
  Problem: RH Balance Goal: LTG Patient will maintain dynamic standing balance (PT) Description: LTG:  Patient will maintain dynamic standing balance with assistance during mobility activities (PT) Outcome: Not Met (add Reason)   Problem: RH Bed to Chair Transfers Goal: LTG Patient will perform bed/chair transfers w/assist (PT) Description: LTG: Patient will perform bed to chair transfers with assistance (PT). Outcome: Not Met (add Reason)   Problem: RH Ambulation Goal: LTG Patient will ambulate in controlled environment (PT) Description: LTG: Patient will ambulate in a controlled environment, # of feet with assistance (PT). Outcome: Not Met (add Reason) Goal: LTG Patient will ambulate in home environment (PT) Description: LTG: Patient will ambulate in home environment, # of feet with assistance (PT). Outcome: Not Met (add Reason)   Problem: RH Balance Goal: LTG Patient will maintain dynamic sitting balance (PT) Description: LTG:  Patient will maintain dynamic sitting balance with assistance during mobility activities (PT) Outcome: Completed/Met   Problem: Sit to Stand Goal: LTG:  Patient will perform sit to stand with assistance level (PT) Description: LTG:  Patient will perform sit to stand with assistance level (PT) Outcome: Completed/Met   Problem: RH Bed Mobility Goal: LTG Patient will perform bed mobility with assist (PT) Description: LTG: Patient will perform bed mobility with assistance, with/without cues (PT). Outcome: Completed/Met   Problem: RH Car Transfers Goal: LTG Patient will perform car transfers with assist (PT) Description: LTG: Patient will perform car transfers with assistance (PT). Outcome: Completed/Met   Problem: RH Stairs Goal: LTG Patient will ambulate up and down stairs w/assist (PT) Description: LTG: Patient will ambulate up and down # of stairs with assistance (PT) Outcome: Completed/Met

## 2022-04-26 NOTE — Plan of Care (Signed)
  Problem: RH Swallowing Goal: LTG Patient will consume least restrictive diet using compensatory strategies with assistance (SLP) Description: LTG:  Patient will consume least restrictive diet using compensatory strategies with assistance (SLP) Outcome: Completed/Met Goal: LTG Patient will participate in dysphagia therapy to increase swallow function with assistance (SLP) Description: LTG:  Patient will participate in dysphagia therapy to increase swallow function with assistance (SLP) Outcome: Completed/Met Goal: LTG Pt will demonstrate functional change in swallow as evidenced by bedside/clinical objective assessment (SLP) Description: LTG: Patient will demonstrate functional change in swallow as evidenced by bedside/clinical objective assessment (SLP) Outcome: Completed/Met   Problem: RH Cognition - SLP Goal: RH LTG Patient will demonstrate orientation with cues Description:  LTG:  Patient will demonstrate orientation to person/place/time/situation with cues (SLP)   Outcome: Completed/Met   Problem: RH Comprehension Communication Goal: LTG Patient will comprehend basic/complex auditory (SLP) Description: LTG: Patient will comprehend basic/complex auditory information with cues (SLP). Outcome: Completed/Met   Problem: RH Expression Communication Goal: LTG Patient will express needs/wants via multi-modal(SLP) Description: LTG:  Patient will express needs/wants via multi-modal communication (gestures/written, etc) with cues (SLP) Outcome: Completed/Met Goal: LTG Patient will verbally express basic/complex needs(SLP) Description: LTG:  Patient will verbally express basic/complex needs, wants or ideas with cues  (SLP) Outcome: Completed/Met Goal: LTG Patient will increase word finding of common (SLP) Description: LTG:  Patient will increase word finding of common objects/daily info/abstract thoughts with cues using compensatory strategies (SLP). Outcome: Completed/Met   Problem: RH  Attention Goal: LTG Patient will demonstrate this level of attention during functional activites (SLP) Description: LTG:  Patient will will demonstrate this level of attention during functional activites (SLP) Outcome: Completed/Met   Problem: RH Awareness Goal: LTG: Patient will demonstrate awareness during functional activites type of (SLP) Description: LTG: Patient will demonstrate awareness during functional activites type of (SLP) Outcome: Completed/Met

## 2022-04-26 NOTE — Discharge Instructions (Addendum)
Inpatient Rehab Discharge Instructions  Elaine Ford Discharge date and time: 04/27/22   Activities/Precautions/ Functional Status: Activity: no lifting, driving, or strenuous exercise till cleared by MD Diet: cardiac diet Wound Care: none needed   Functional status:  ___ No restrictions     ___ Walk up steps independently _X__ 24/7 supervision/assistance   ___ Walk up steps with assistance ___ Intermittent supervision/assistance  ___ Bathe/dress independently ___ Walk with walker     _X__ Bathe/dress with assistance ___ Walk Independently    ___ Shower independently ___ Walk with assistance    ___ Shower with assistance _X__ No alcohol     ___ Return to work/school ________  Special Instructions:    COMMUNITY REFERRALS UPON DISCHARGE:    Home Health:   PT  OT  SP                  Agency: [facility]      Phone: [phone]  Medical Equipment/Items Ordered:WHEELCHAIR, Vassie Moselle AND TUB BENCH                                                 Agency/Supplier:[facility]   [phone]    STROKE/TIA DISCHARGE INSTRUCTIONS SMOKING Cigarette smoking nearly doubles your risk of having a stroke & is the single most alterable risk factor  If you smoke or have smoked in the last 12 months, you are advised to quit smoking for your health. Most of the excess cardiovascular risk related to smoking disappears within a year of stopping. Ask you doctor about anti-smoking medications [place] Quit Line: 1-800-QUIT NOW Free Smoking Cessation Classes [phone]  CHOLESTEROL Know your levels; limit fat & cholesterol in your diet  Lipid Panel     Component Value Date/Time   CHOL 213 (H) 03/29/2022 0839   CHOL 147 07/23/2014 0200   TRIG 364 (H) 03/29/2022 0839   TRIG 127 07/23/2014 0200   HDL 36 (L) 03/29/2022 0839   HDL 38 (L) 07/23/2014 0200   CHOLHDL 5.9 03/29/2022 0839   VLDL 73 (H) 03/29/2022 0839   VLDL 25 07/23/2014 0200   LDLCALC 104 (H) 03/29/2022 0839    LDLCALC 84 07/23/2014 0200     Many patients benefit from treatment even if their cholesterol is at goal. Goal: Total Cholesterol (CHOL) less than 160 Goal:  Triglycerides (TRIG) less than 150 Goal:  HDL greater than 40 Goal:  LDL (LDLCALC) less than 100   BLOOD PRESSURE American Stroke Association blood pressure target is less that 120/80 mm/Hg  Your discharge blood pressure is:  BP: (!) 152/59 Monitor your blood pressure Limit your salt and alcohol intake Many individuals will require more than one medication for high blood pressure  DIABETES (A1c is a blood sugar average for last 3 months) Goal HGBA1c is under 7% (HBGA1c is blood sugar average for last 3 months)  Diabetes: No known diagnosis of diabetes    Lab Results  Component Value Date   HGBA1C 5.5 03/28/2022    Your HGBA1c can be lowered with medications, healthy diet, and exercise. Check your blood sugar as directed by your physician Call your physician if you experience unexplained or low blood sugars.  PHYSICAL ACTIVITY/REHABILITATION Goal is 30 minutes at least 4 days per week  Activity: Increase activity slowly,, No driving,, and Walk with assistance, Therapies: see above Return  to work: N/A Activity decreases your risk of heart attack and stroke and makes your heart stronger.  It helps control your weight and blood pressure; helps you relax and can improve your mood. Participate in a regular exercise program. Talk with your doctor about the best form of exercise for you (dancing, walking, swimming, cycling).  DIET/WEIGHT Goal is to maintain a healthy weight  Your discharge diet is:  Diet Order             DIET DYS 3 Room service appropriate? Yes with Assist; Fluid consistency: Thin  Diet effective 0500                   liquids Your height is:  Height: '5\' 4"'$  (162.6 cm) Your current weight is: 188 lbs Your Body Mass Index (BMI) is:  BMI (Calculated): 32.37 Following the type of diet specifically designed for  you will help prevent another stroke. Your goal weight 145 lbs Your goal Body Mass Index (BMI) is 19-24. Healthy food habits can help reduce 3 risk factors for stroke:  High cholesterol, hypertension, and excess weight.  RESOURCES Stroke/Support Group:  Call [phone]   STROKE EDUCATION PROVIDED/REVIEWED AND GIVEN TO PATIENT Stroke warning signs and symptoms How to activate emergency medical system (call 911). Medications prescribed at discharge. Need for follow-up after discharge. Personal risk factors for stroke. Pneumonia vaccine given:  Flu vaccine given:  My questions have been answered, the writing is legible, and I understand these instructions.  I will adhere to these goals & educational materials that have been provided to me after my discharge from the hospital.      My questions have been answered and I understand these instructions. I will adhere to these goals and the provided educational materials after my discharge from the hospital.  Patient/Caregiver Signature _______________________________ Date __________  Clinician Signature _______________________________________ Date __________  Please bring this form and your medication list with you to all your follow-up doctor's appointments.

## 2022-04-26 NOTE — TOC Transition Note (Signed)
Discharge medications delivered to husband in room. Husband was instructed to take medications home when he leaves today.

## 2022-04-26 NOTE — Progress Notes (Signed)
PROGRESS NOTE   Subjective/Complaints: No new complaints this morning Discussed plan for d/c tomorrow, discussed that I will message Pam that husband will stay overnight and patient will be ready for d/c early.   ROS: +pain in right thumb and wrist, constipation has improved, +rash on left lower side of mouth- improved, denies SOB, Denies CP,+plaques on scalp  Objective:   No results found.  No results for input(s): "WBC", "HGB", "HCT", "PLT" in the last 72 hours.  Recent Labs    04/25/22 0525  NA 141  K 3.7  CL 105  CO2 24  GLUCOSE 95  BUN 19  CREATININE 0.78  CALCIUM 9.5      Intake/Output Summary (Last 24 hours) at 04/26/2022 1109 Last data filed at 04/25/2022 1849 Gross per 24 hour  Intake 480 ml  Output --  Net 480 ml         Physical Exam: Vital Signs Blood pressure (!) 122/54, pulse 68, temperature 98.8 F (37.1 C), temperature source Oral, resp. rate 14, height '5\' 4"'$  (1.626 m), weight 85.6 kg, SpO2 98 %. Vitals and nursing note reviewed. BMI 32.39 Constitutional:      Comments:  NAD, R eye conjunctival redness Neurological:     Mental Status: awake    Comments: Right inattention with decrease in EOMI to the right. Right facial weakness. Receptive> expressive deficits with delay in processing. + perseverative echolaly. Occasional social utterance. Able to follow simple motor commands with visual and tactile cues.    General: No acute distress Mood and affect are appropriate Heart: Regular rate and rhythm no rubs murmurs or extra sounds Lungs: CTAB, breathing unlabored, no rales or wheezes.  Abdomen: Positive bowel sounds, no longer TTP lower abdomen, nondistended Extremities: No clubbing, cyanosis, or edema. Moving LUE antigravity; RUE no appreciable movement in bed.  Skin: No evidence of breakdown, small area of rash left face inferior and lateral to mouth- a little improved today, chapped lips,  plaques on scalp Neurologic:  Improved right sided attention Trace finger flexin RUE o/w 0/5 3- Right HF, KE, 2- ankle PT/DF Sensory exam reduced light touch LEFT upper and lower extremities Musculoskeletal: no pain with  range of motion in all 4 extremities. No joint swelling Ambulating MinA with RW, ambulating stairs with assist Performing stair training MinA to answer yes and no questions Tenderness to R thumb and wrist with palpation   Assessment/Plan: 1. Functional deficits which require 3+ hours per day of interdisciplinary therapy in a comprehensive inpatient rehab setting. Physiatrist is providing close team supervision and 24 hour management of active medical problems listed below. Physiatrist and rehab team continue to assess barriers to discharge/monitor patient progress toward functional and medical goals  Care Tool:  Bathing    Body parts bathed by patient: Right arm, Chest, Abdomen, Face, Right upper leg, Left upper leg, Front perineal area, Right lower leg, Left lower leg, Left arm, Buttocks   Body parts bathed by helper: Right lower leg, Left lower leg, Buttocks, Left arm Body parts n/a: Buttocks   Bathing assist Assist Level: Minimal Assistance - Patient > 75%     Upper Body Dressing/Undressing Upper body dressing   What is  the patient wearing?: Button up shirt    Upper body assist Assist Level: Minimal Assistance - Patient > 75%    Lower Body Dressing/Undressing Lower body dressing      What is the patient wearing?: Incontinence brief, Pants     Lower body assist Assist for lower body dressing: Minimal Assistance - Patient > 75%     Toileting Toileting Toileting Activity did not occur (Clothing management and hygiene only): N/A (no void or bm)  Toileting assist Assist for toileting: Minimal Assistance - Patient > 75%     Transfers Chair/bed transfer  Transfers assist  Chair/bed transfer activity did not occur: Safety/medical  concerns  Chair/bed transfer assist level: Moderate Assistance - Patient 50 - 74%     Locomotion Ambulation   Ambulation assist      Assist level: Moderate Assistance - Patient 50 - 74% Assistive device: Hand held assist Max distance: 20   Walk 10 feet activity   Assist  Walk 10 feet activity did not occur: Safety/medical concerns  Assist level: Moderate Assistance - Patient - 50 - 74% Assistive device: Hand held assist   Walk 50 feet activity   Assist Walk 50 feet with 2 turns activity did not occur: Safety/medical concerns         Walk 150 feet activity   Assist Walk 150 feet activity did not occur: Safety/medical concerns         Walk 10 feet on uneven surface  activity   Assist Walk 10 feet on uneven surfaces activity did not occur: Safety/medical concerns         Wheelchair     Assist Is the patient using a wheelchair?: Yes Type of Wheelchair: Manual    Wheelchair assist level: Dependent - Patient 0% Max wheelchair distance: 36f    Wheelchair 50 feet with 2 turns activity    Assist        Assist Level: Dependent - Patient 0%   Wheelchair 150 feet activity     Assist      Assist Level: Dependent - Patient 0%   Blood pressure (!) 122/54, pulse 68, temperature 98.8 F (37.1 C), temperature source Oral, resp. rate 14, height '5\' 4"'$  (1.626 m), weight 85.6 kg, SpO2 98 %.    Medical Problem List and Plan: 1. Functional deficits secondary to Left MCA infarct             -patient may  shower             -ELOS/Goals: 14-16d MinA  Start vitamin B/C supplement  Continue CIR  Discussed plan for d/c tomorrow  Discussed nutrition and exercise plan for home for stroke recovery and prevention  Messaged Becky regarding helping husband with script for stairlift on Monday 2.  Antithrombotics: -DVT/anticoagulation:  Mechanical: Sequential compression devices, below knee Bilateral lower extremities             -antiplatelet  therapy: DAPT X 3 weeks followed by ASA alone.  3. Pain Management:  Tylenol prn.  4. Mood/Behavior/Sleep: LCSW to follow for evaluation and support.              -antipsychotic agents: N/A 5. Neuropsych/cognition: This patient is not fully capable of making decisions on her own behalf. 6. Skin/Wound Care: Routine pressure relief measures.  7. Fluids/Electrolytes/Nutrition: Monitor I/O. 8.  HTN: Monitor BP TID--continue coreg with BP goal <160. Currently patient is at goal. Continue amlodipine '5mg'$ . Provided list of foods to help with blood pressure. Continue magnesium gluconate  $'500mg'y$  HS. Recommended Bi[employer]em[employer]'s magnesium supplement at home.  9. Dysphagia: Advance to D3/thins. Asking nursing to provide her with a water chart so we can ensure she is drinking 6-8 glasses of water per day   Diet Orders (From admission, onward)     Start     Ordered   04/22/22 1217  DIET DYS 3 Room service appropriate? Yes with Assist; Fluid consistency: Thin  Diet effective 0500       Comments: Add extra sauces and gravy  Question Answer Comment  Room service appropriate? Yes with Assist   Fluid consistency: Thin      04/22/22 1217            10. Hyperglycemia: Hgb A1C-5.5. Discontinue CBGs.Provided list of foods to help with elevated blood sugars. Discussed metformin as an option but patient and husband prefer dietary/exercise strategies first. Discussed the benefits of walking after meals.  No results for input(s): "GLUCAP" in the last 72 hours. 11 L-BBB s/p PPM: Brief episode of A flutter. Continue to monitor. Continue magnesium gluconate '500mg'$  HS 12. Hyperlipidemia: continue Lipitor 13.  ABLA: normalized, continue to monitor weekly.  14. UTI: symptoms resolved.  15. Chest congestion: chest PT ordered. Resolved with discontinuation of TF 16. Obesity: BMI 32-->33.45-->32.81-->32.39: provide dietary education, TF discontinued discussed with patient and husband, may discontinue daily weights 17.  Hemorrhoid: witch hazel pads ordered. Discussed that softening the stool will help to prevent straining and prevent new hemorrhoids from forming.  18. Gross hematuria: resolved, but no need to resume Lovenox since ambulating to and from apartment 19. Constipation: discontinue senna-docusate   -LBM 10/14- improved, continue to monitor 20. Small area of rash L face  -possibly minor contact dermatitis, avoid new soaps or orther possible irritants, continue to monitor for now, appears to be improving  -10/16 Continue Cetaphil cream 21. Suboptimal vitamin D level: start ergocalciferol 50,000U once per weel for 7 weeks.  22. Suboptimal potassium: continue klor 43mq daily. Level was 4.2 on 10/11, recheck potassium on Monday.  23. Conjunctival redness R eye  -continue Ketotifin drops 23. Seborrheic plaques: continue Selsun shampoo  LOS: 20 days A FACE TO FACE EVALUATION WAS PERFORMED  Doy Taaffe P Abhijay Morriss 04/26/2022, 11:09 AM

## 2022-04-26 NOTE — Progress Notes (Signed)
Occupational Therapy Discharge Summary  Patient Details  Name: Elaine Ford MRN: [id number] Date of Birth: [birth year]   Today's Date: 04/26/2022 OT Individual Time: 0730-0830 and 1400-1430 OT Individual Time Calculation (min): 60 min and 30 min    AM Session:   Patient received seated in wheelchair having breakfast.  Patient aware that this is her last day for rehab and excited for discharge home.  Assisted patient with bathing and dressing - husband present.  Attempted to use reacher and step stool to increase participation and decrease level of assistance to complete LB dressing.  Patient completes sit to stand and stand balance at sink with supervision.   Patient demonstrates edema in MCP/PIP and reports pain with extension and end range flexion in right hand.  Provided gentle traction and edema management and applied thumb spica splint. Patient is completing self range of motion several times / day to improve digit flex/ext.   Patient left up in wheelchair with husband.    PM Session:   Neuromuscular reeducation to RUE.  Completed supine ball exercises to address reach patterns in shoulder and elbow.  Worked on force use concepts, drop and catch to increase sustained activation in RUE.  Gentle carpal mobilization and digit range.  Patient walked back to room from main gym, and put back to bed with bed alarm on.  Husband leaving to prepare home for discharge.  Call bell and personal items in reach for patient.    Date of Discharge from [place] 17, 2023    Patient has met 7 of 8 long term goals due to improved activity tolerance, improved balance, postural control, ability to compensate for deficits, functional use of  RIGHT upper and RIGHT lower extremity, improved attention, improved awareness, and improved coordination.  Patient to discharge at [facility] Assist level.  Patient's care partner is independent to provide the necessary physical and cognitive assistance at  discharge.    Reasons goals not met: Needs mod assist for donning socks/shoes  Recommendation:  Patient will benefit from ongoing skilled OT services in home health then OP setting to continue to advance functional skills in the area of BADL, iADL, Reduce care partner burden, and functional use of dominant RUE .  Equipment: BSC, Tub transfer bench,  wheelchair, RW with walker splint  Reasons for discharge: treatment goals met and discharge from hospital  Patient/family agrees with progress made and goals achieved: Yes  OT Discharge Precautions/Restrictions  Precautions Precautions: Fall Precaution Comments: R hemi, apraxic Restrictions Weight Bearing Restrictions: No  Pain Pain Assessment Pain Scale: 0-10 Pain Score: 3  Pain Type: Acute pain Pain Location: Hand Pain Orientation: Right Pain Descriptors / Indicators: Aching Pain Onset: With Activity Patients Stated Pain Goal: 0 Pain Intervention(s): Repositioned;Relaxation;Elevated extremity;Splinting Multiple Pain Sites: No ADL ADL Eating: Supervision/safety Where Assessed-Eating: Chair Grooming: Minimal assistance Where Assessed-Grooming: Standing at sink Upper Body Bathing: Minimal assistance Where Assessed-Upper Body Bathing: Shower, Sitting at sink Lower Body Bathing: Minimal assistance Where Assessed-Lower Body Bathing: Shower, Sitting at sink, Standing at sink Upper Body Dressing: Minimal assistance Where Assessed-Upper Body Dressing: Chair Lower Body Dressing: Moderate assistance Where Assessed-Lower Body Dressing: Chair Toileting: Minimal assistance Where Assessed-Toileting: Glass blower/designer: Therapist, music Method: Counselling psychologist: Radiographer, therapeutic: Medical sales representative Method: Engineer, production: Facilities manager: Environmental education officer Method: Conservation officer, historic buildings: Gaffer Baseline Vision/History: 1 Wears glasses Patient Visual Report: No  change from baseline Vision Assessment?: Vision impaired- to be further tested in functional context Additional Comments: mild right inattention Perception  Perception: Impaired Inattention/Neglect: Does not attend to right side of body Praxis Praxis: Impaired Praxis Impairment Details: Motor planning Praxis-Other Comments: Verbal perseveration - much improved since date of evaluation Cognition Cognition Overall Cognitive Status: Impaired/Different from baseline Arousal/Alertness: Awake/alert Memory: Impaired Memory Impairment: Retrieval deficit;Decreased short term memory Decreased Short Term Memory: Functional basic Attention: Selective Focused Attention: Appears intact Focused Attention Impairment: Functional basic Sustained Attention: Appears intact Sustained Attention Impairment: Functional basic Selective Attention: Impaired Selective Attention Impairment: Functional basic Awareness: Impaired Awareness Impairment: Emergent impairment Problem Solving: Impaired Problem Solving Impairment: Functional basic Executive Function: Initiating;Organizing Organizing: Impaired Organizing Impairment: Functional basic Initiating: Impaired Initiating Impairment: Functional basic Self Correcting: Impaired Self Correcting Impairment: Functional basic Behaviors: Poor frustration tolerance Safety/Judgment: Impaired Comments: Decreased insight into deficits Brief Interview for Mental Status (BIMS) Repetition of Three Words (First Attempt): 3 Temporal Orientation: Year: Missed by 2 to 5 years Temporal Orientation: Month: Missed by more than 1 month Temporal Orientation: Day: Correct Recall: "Sock": No, could not recall Recall: "Blue": No, could not recall Recall: "Bed": No, could not recall BIMS Summary Score: 5 Sensation Sensation Light Touch: Impaired by gross  assessment Hot/Cold: Impaired by gross assessment Proprioception: Impaired by gross assessment Stereognosis: Impaired by gross assessment Stereognosis Impaired Details: Impaired RLE;Impaired RUE Additional Comments: Improved awareness - can feel pain in right hand Coordination Gross Motor Movements are Fluid and Coordinated: No Fine Motor Movements are Fluid and Coordinated: No Coordination and Movement Description: R hemi (UE > LE), apraxic 9 Hole Peg Test: unable Motor  Motor Motor: Hemiplegia;Motor apraxia;Abnormal tone Motor - Discharge Observations: Significant improvement since DOE Mobility  Bed Mobility Bed Mobility: Supine to Sit;Sit to Supine;Rolling Right Rolling Right: Minimal Assistance - Patient > 75% Rolling Left: Minimal Assistance - Patient > 75% Supine to Sit: Minimal Assistance - Patient > 75% Sit to Supine: Minimal Assistance - Patient > 75% Transfers Sit to Stand: Supervision/Verbal cueing Stand to Sit: Supervision/Verbal cueing  Trunk/Postural Assessment  Cervical Assessment Cervical Assessment: Exceptions to [facility] (forward head, limited rotation toward right) Thoracic Assessment Thoracic Assessment: Exceptions to [facility] (flat anterior chest wall) Lumbar Assessment Lumbar Assessment: Exceptions to [facility] (posterior tilt) Postural Control Postural Control: Deficits on evaluation Trunk Control: Trunk lean to the R in standing. Worsens with dynamic tasks Righting Reactions: delayed  and impaired Protective Responses: delayed and impaired  Balance Balance Balance Assessed: Yes Static Sitting Balance Static Sitting - Balance Support: Feet supported;No upper extremity supported Static Sitting - Level of Assistance: 7: Independent Dynamic Sitting Balance Dynamic Sitting - Balance Support: Feet supported;No upper extremity supported Dynamic Sitting - Level of Assistance: 5: Stand by assistance Sitting balance - Comments: R lateral lean; assist for upright  orientation in sitting Static Standing Balance Static Standing - Balance Support: No upper extremity supported Static Standing - Level of Assistance: 5: Stand by assistance Dynamic Standing Balance Dynamic Standing - Balance Support: No upper extremity supported;During functional activity Dynamic Standing - Level of Assistance: 4: Min assist Dynamic Standing - Balance Activities: Forward lean/weight shifting;Lateral lean/weight shifting Extremity/Trunk Assessment RUE Assessment RUE Assessment: Exceptions to [facility] Passive Range of Motion (PROM) Comments: Lacks full digit flexion Active Range of Motion (AROM) Comments: Has active shoulder flexion, extension, abduction through 25% range General Strength Comments: NT RUE Body System: Neuro Brunstrum levels for arm and hand: Arm;Hand Brunstrum level for arm: Stage III Synergy is performed voluntarily  Brunstrum level for hand: Stage II Synergy is developing LUE Assessment LUE Assessment: Within Functional Limits Active Range of Motion (AROM) Comments: WNL shoulder, wrist, elbow, and hand in all ranges.   Mariah Milling 04/26/2022, 3:44 PM

## 2022-04-26 NOTE — Plan of Care (Signed)
  Problem: RH BOWEL ELIMINATION Goal: RH STG MANAGE BOWEL WITH ASSISTANCE Description: STG Manage Bowel with mod Assistance. Outcome: Not Progressing; constipation   Problem: RH BLADDER ELIMINATION Goal: RH STG MANAGE BLADDER WITH ASSISTANCE Description: STG Manage Bladder With mod Assistance Outcome: Not Progressing; incontinence   Problem: RH SKIN INTEGRITY Goal: RH STG SKIN FREE OF INFECTION/BREAKDOWN Description: Skin will be free of infection/breakdown with min assist Outcome: Progressing; Dr. Ranell Patrick made aware of improvement of facial rashes   Problem: RH SAFETY Goal: RH STG ADHERE TO SAFETY PRECAUTIONS W/ASSISTANCE/DEVICE Description: STG Adhere to Safety Precautions With min Assistance/Device. Outcome: Progressing; per report husband check off to transfer

## 2022-04-27 NOTE — Progress Notes (Signed)
Inpatient Rehabilitation Discharge Medication Review by a Pharmacist  A complete drug regimen review was completed for this patient to identify any potential clinically significant medication issues.  High Risk Drug Classes Is patient taking? Indication by Medication  Antipsychotic No   Anticoagulant No   Antibiotic No   Opioid No   Antiplatelet Yes Aspirin-stroke  Hypoglycemics/insulin No   Vasoactive Medication Yes Amlodipine, Coreg for HTN  Chemotherapy No   Other Yes Omeprazole-GERD Ketotfin eye drops-eye irritation Melatonin-sleep Mag-ox-mag replacement Poly-iron-iron replacement Ergocalciferol-Vit D replacement Atorvastatin-HLD/Stroke     Type of Medication Issue Identified Description of Issue Recommendation(s)  Drug Interaction(s) (clinically significant)     Duplicate Therapy     Allergy     No Medication Administration End Date     Incorrect Dose     Additional Drug Therapy Needed     Significant med changes from prior encounter (inform family/care partners about these prior to discharge).    Other       Clinically significant medication issues were identified that warrant physician communication and completion of prescribed/recommended actions by midnight of the next day:  No  Name of provider notified for urgent issues identified:   Provider Method of Notification:     Pharmacist comments:   Time spent performing this drug regimen review (minutes):  [address] 04/27/2022 8:49 AM

## 2022-04-27 NOTE — Progress Notes (Signed)
PROGRESS NOTE   Subjective/Complaints: Stable for d/c today Patient's chart reviewed- No issues reported overnight Vitals signs stable except for elevated SBP, low DBP  Discussed that shampoo is for scalp plaques  ROS: +pain in right thumb and wrist, constipation has improved, +rash on left lower side of mouth- improved, denies SOB, Denies CP,+plaques on scalp, denies dizziness  Objective:   No results found.  No results for input(s): "WBC", "HGB", "HCT", "PLT" in the last 72 hours.  Recent Labs    04/25/22 0525  NA 141  K 3.7  CL 105  CO2 24  GLUCOSE 95  BUN 19  CREATININE 0.78  CALCIUM 9.5      Intake/Output Summary (Last 24 hours) at 04/27/2022 0902 Last data filed at 04/27/2022 0700 Gross per 24 hour  Intake 540 ml  Output --  Net 540 ml         Physical Exam: Vital Signs Blood pressure (!) 152/59, pulse 66, temperature 98.4 F (36.9 C), temperature source Oral, resp. rate 18, height '5\' 4"'$  (1.626 m), weight 85.6 kg, SpO2 97 %. Vitals and nursing note reviewed. BMI 32.39 Constitutional:      Comments:  NAD, R eye conjunctival redness Neurological:     Mental Status: awake    Comments: Right inattention with decrease in EOMI to the right. Right facial weakness. Receptive> expressive deficits with delay in processing. + perseverative echolaly. Occasional social utterance. Able to follow simple motor commands with visual and tactile cues.    General: No acute distress Mood and affect are appropriate Heart: Regular rate and rhythm no rubs murmurs or extra sounds Lungs: CTAB, breathing unlabored, no rales or wheezes.  Abdomen: Positive bowel sounds, no longer TTP lower abdomen, nondistended Extremities: +right hand swelling. Moving LUE antigravity; RUE no appreciable movement in bed.  Skin: No evidence of breakdown, small area of rash left face inferior and lateral to mouth- a little improved today,  chapped lips, plaques on scalp Neurologic:  Improved right sided attention Trace finger flexin RUE o/w 0/5 3- Right HF, KE, 2- ankle PT/DF Sensory exam reduced light touch LEFT upper and lower extremities Musculoskeletal: no pain with  range of motion in all 4 extremities. No joint swelling Ambulating MinA with RW, ambulating stairs with assist Performing stair training MinA to answer yes and no questions Tenderness to R thumb and wrist with palpation   Assessment/Plan: 1. Functional deficits which require 3+ hours per day of interdisciplinary therapy in a comprehensive inpatient rehab setting. Physiatrist is providing close team supervision and 24 hour management of active medical problems listed below. Physiatrist and rehab team continue to assess barriers to discharge/monitor patient progress toward functional and medical goals  Care Tool:  Bathing    Body parts bathed by patient: Right arm, Chest, Abdomen, Face, Right upper leg, Left upper leg, Front perineal area, Right lower leg, Left lower leg, Buttocks   Body parts bathed by helper: Right lower leg, Left lower leg, Buttocks, Left arm Body parts n/a: Buttocks   Bathing assist Assist Level: Minimal Assistance - Patient > 75%     Upper Body Dressing/Undressing Upper body dressing   What is the patient wearing?: Button  up shirt    Upper body assist Assist Level: Contact Guard/Touching assist    Lower Body Dressing/Undressing Lower body dressing      What is the patient wearing?: Pants, Incontinence brief     Lower body assist Assist for lower body dressing: Contact Guard/Touching assist     Toileting Toileting Toileting Activity did not occur (Clothing management and hygiene only): N/A (no void or bm)  Toileting assist Assist for toileting: Minimal Assistance - Patient > 75%     Transfers Chair/bed transfer  Transfers assist  Chair/bed transfer activity did not occur: Safety/medical concerns  Chair/bed  transfer assist level: Contact Guard/Touching assist     Locomotion Ambulation   Ambulation assist      Assist level: Minimal Assistance - Patient > 75% Assistive device: Hand held assist Max distance: 114f   Walk 10 feet activity   Assist  Walk 10 feet activity did not occur: Safety/medical concerns  Assist level: Minimal Assistance - Patient > 75% Assistive device: Hand held assist   Walk 50 feet activity   Assist Walk 50 feet with 2 turns activity did not occur: Safety/medical concerns  Assist level: Minimal Assistance - Patient > 75% Assistive device: Hand held assist    Walk 150 feet activity   Assist Walk 150 feet activity did not occur: Safety/medical concerns  Assist level: Minimal Assistance - Patient > 75% Assistive device: Hand held assist    Walk 10 feet on uneven surface  activity   Assist Walk 10 feet on uneven surfaces activity did not occur: Safety/medical concerns   Assist level: Minimal Assistance - Patient > 75% Assistive device: Hand held assist   Wheelchair     Assist Is the patient using a wheelchair?: Yes Type of Wheelchair: Manual    Wheelchair assist level: Dependent - Patient 0% Max wheelchair distance: 064f   Wheelchair 50 feet with 2 turns activity    Assist        Assist Level: Dependent - Patient 0%   Wheelchair 150 feet activity     Assist      Assist Level: Dependent - Patient 0%   Blood pressure (!) 152/59, pulse 66, temperature 98.4 F (36.9 C), temperature source Oral, resp. rate 18, height '5\' 4"'$  (1.626 m), weight 85.6 kg, SpO2 97 %.    Medical Problem List and Plan: 1. Functional deficits secondary to Left MCA infarct             -patient may  shower             -ELOS/Goals: 14-16d MinA  Start vitamin B/C supplement  D/c home today  Discussed nutrition and exercise plan for home for stroke recovery and prevention  Messaged Becky regarding helping husband with script for stairlift on  Monday 2.  Antithrombotics: -DVT/anticoagulation:  Mechanical: Sequential compression devices, below knee Bilateral lower extremities             -antiplatelet therapy: DAPT X 3 weeks followed by ASA alone.  3. Pain Management:  Tylenol prn.  4. Mood/Behavior/Sleep: LCSW to follow for evaluation and support.              -antipsychotic agents: N/A 5. Neuropsych/cognition: This patient is not fully capable of making decisions on her own behalf. 6. Skin/Wound Care: Routine pressure relief measures.  7. Fluids/Electrolytes/Nutrition: Monitor I/O. 8.  HTN: Monitor BP TID--continue coreg with BP goal <160. Currently patient is at goal. Continue amlodipine '5mg'$ . Provided list of foods to help with  blood pressure. Continue magnesium gluconate '500mg'$  HS. Recommended Bioptem[employer]'s magnesium supplement at home. Discussed that currently SBP is elevated and DBP is soft.  9. Dysphagia: Advance to D3/thins. Asking nursing to provide her with a water chart so we can ensure she is drinking 6-8 glasses of water per day   Diet Orders (From admission, onward)     Start     Ordered   04/22/22 1217  DIET DYS 3 Room service appropriate? Yes with Assist; Fluid consistency: Thin  Diet effective 0500       Comments: Add extra sauces and gravy  Question Answer Comment  Room service appropriate? Yes with Assist   Fluid consistency: Thin      04/22/22 1217            10. Hyperglycemia: Hgb A1C-5.5. Discontinue CBGs.Provided list of foods to help with elevated blood sugars. Discussed metformin as an option but patient and husband prefer dietary/exercise strategies first. Discussed the benefits of walking after meals.  No results for input(s): "GLUCAP" in the last 72 hours. 11 L-BBB s/p PPM: Brief episode of A flutter. Continue to monitor. Continue magnesium gluconate '500mg'$  HS 12. Hyperlipidemia: continue Lipitor 13.  ABLA: normalized, continue to monitor weekly.  14. UTI: symptoms resolved.  15. Chest  congestion: chest PT ordered. Resolved with discontinuation of TF 16. Obesity: BMI 32-->33.45-->32.81-->32.39: provide dietary education, TF discontinued discussed with patient and husband, may discontinue daily weights 17. Hemorrhoid: witch hazel pads ordered. Discussed that softening the stool will help to prevent straining and prevent new hemorrhoids from forming.  18. Gross hematuria: resolved, but no need to resume Lovenox since ambulating to and from apartment 19. Constipation: discontinue senna-docusate   -LBM 10/14- improved, continue to monitor 20. Small area of rash L face  -possibly minor contact dermatitis, avoid new soaps or orther possible irritants, continue to monitor for now, continue Cetaphil cream 21. Suboptimal vitamin D level: start ergocalciferol 50,000U once per weel for 7 weeks.  22. Suboptimal potassium: continue klor 86mq daily. Level was 4.2 on 10/11, recheck potassium on Monday.  23. Conjunctival redness R eye  -continue Ketotifin drops 23. Seborrheic plaques: continue Selsun shampoo   >30 minutes spent in discharge of patient including review of medications and follow-up appointments, physical examination, and in answering all patient's questions   LOS: 21 days A FACE TO FACE EVALUATION WAS PSan Juan10/18/2023, 9:02 AM

## 2022-05-10 ENCOUNTER — Encounter: Payer: Medicare HMO | Attending: Registered Nurse | Admitting: Registered Nurse

## 2022-05-10 ENCOUNTER — Encounter: Payer: Self-pay | Admitting: Registered Nurse

## 2022-05-10 VITALS — BP 145/80 | HR 71 | Ht 64.0 in | Wt 189.0 lb

## 2022-05-10 DIAGNOSIS — I10 Essential (primary) hypertension: Secondary | ICD-10-CM | POA: Diagnosis not present

## 2022-05-10 DIAGNOSIS — E7849 Other hyperlipidemia: Secondary | ICD-10-CM | POA: Insufficient documentation

## 2022-05-10 DIAGNOSIS — I63512 Cerebral infarction due to unspecified occlusion or stenosis of left middle cerebral artery: Secondary | ICD-10-CM | POA: Insufficient documentation

## 2022-05-10 NOTE — Progress Notes (Signed)
+  Subjective:    Patient ID: Elaine Ford, female    DOB: Feb 08, 1946, 76 y.o.   MRN: [id number]  HPI: Elaine Ford is a 76 y.o. female who is here for [place] appointment for follow up of her  Acute Ischemic left Middle Cerebral Artery, Essential Hypertension and Hyperlipidemia. She was  brought to [facility] on 03/28/2022 via EMS  Dr. Cheral Marker H&P   Elaine Ford is an 76 y.o. female with a PMHx of gallstones, gestational diabetes, HLD, HTN LBBB s/p pacemaker placement and hypotension, presenting to the ED via EMS with acute onset of right hemiplegia and aphasia. LKN was 1000. She was found slumped over to the right on her couch at home by her husband. Husband states that during a work up for another condition brain imaging was performed, revealing an old stroke, but patient was never aware of any stroke-like symptoms in the past. She is not on a blood thinner.    On arrival to the ED, she continued to be aphasic, with dense right sided weakness.  CT Head WO Contrast:  IMPRESSION: 1. Hyperdensity in the left MCA near the bifurcation and in the proximal M2 branches, which may represent atherosclerotic disease and/or thrombus. 2. ASPECTS is 10  CTA:  IMPRESSION: 1. Occlusion of the left distal M1 with minimal flow in the proximal left M2 branches and poor perfusion of the remainder of the left MCA territory. This correlates with the hyperdensity seen on the same-day CT head. 2. Multifocal stenosis in the bilateral PCAs, which is severe at the origin of one of the left P3 branches, moderate in the proximal right P1 and throughout the P2, and mild in the left P2. Poor perfusion of the right P3 segments. 3. Additional mild stenosis in the distal right M1, proximal M2, and in the left V4. 4. Mild stenosis of the proximal right V1 segment. No other hemodynamically significant stenosis in the neck.  CT Head WO Contrast IMPRESSION: Hyperdensity within the left caudate and lentiform nuclei,  which may reflect contrast staining and/or hemorrhage. Moderate-volume hyperdensity along the left cerebral hemisphere, likely reflecting a combination of extravasated contrast and subarachnoid hemorrhage. Findings have progressed from the post-procedure head CT performed earlier today at 3:29 p.m. There is no significant associated mass effect at this time. No evidence of hydrocephalus.   The thrombus/embolus which was previously present within the M1 left middle cerebral artery now appears to have migrated more distally into an M2 left MCA vessel.   She was maintained Plavix and ASA DAPT x 3 weeks then ASA alone.   She was admitted to inpatient rehabilitation on 04/06/2022 and discharged home on 04/27/2022. She is receiving [place] with [place]. . She is alert x 1, she stated her name, she has mixed aphasia with responses.She is able to follow directions.   Husband states she has pain in her right hand, her husband states this is not new. We will continue to monitor.   She arrived in wheelchair , husband states she walks in the home with walker.   Husband in room.     Pain Inventory Average Pain 3 Pain Right Now 3 My pain is constant and aching  LOCATION OF PAIN  Right side pain: wrist, fingers, hand  BOWEL Number of stools per week: 7   BLADDER Normal  Bladder incontinence Yes    Mobility walk with assistance use a walker ability to climb steps?  yes do you drive?  no use a wheelchair needs help with transfers Do you have any goals in this area?  yes  Function retired I need assistance with the following:  dressing, bathing, meal prep, household duties, and shopping Do you have any goals in this area?  yes  Neuro/Psych weakness trouble walking  Prior Studies Any changes since last visit?  no  Physicians involved in your care Any changes since last visit?  no   Family History  Problem Relation Age of Onset   Heart attack  Father    Stroke Father    Heart disease Brother    Social History   Socioeconomic History   Marital status: Married    Spouse name: Not on file   Number of children: Not on file   Years of education: Not on file   Highest education level: Not on file  Occupational History   Not on file  Tobacco Use   Smoking status: Never   Smokeless tobacco: Never  Vaping Use   Vaping Use: Never used  Substance and Sexual Activity   Alcohol use: No   Drug use: No   Sexual activity: Yes    Birth control/protection: Post-menopausal  Other Topics Concern   Not on file  Social History Narrative   Not on file   Social Determinants of Health   Financial Resource Strain: Not on file  Food Insecurity: Not on file  Transportation Needs: Not on file  Physical Activity: Not on file  Stress: Not on file  Social Connections: Not on file   Past Surgical History:  Procedure Laterality Date   COLONOSCOPY     DILATATION & CURETTAGE/HYSTEROSCOPY WITH MYOSURE N/A 02/29/2016   Procedure: [place];  Surgeon: [name], DO;  Location: [facility];  Service: Gynecology;  Laterality: N/A;  patient has a pacemaker    GALLBLADDER SURGERY     IR 3D INDEPENDENT WKST  03/28/2022   IR PERCUTANEOUS ART THROMBECTOMY/INFUSION INTRACRANIAL INC DIAG ANGIO  03/28/2022   IR US GUIDE VASC ACCESS RIGHT  03/28/2022   LAPAROSCOPIC BILATERAL SALPINGO OOPHERECTOMY  04/20/2016   Procedure: LAPAROSCOPIC BILATERAL SALPINGO OOPHORECTOMY;  Surgeon: [name], DO;  Location: [facility];  Service: Gynecology;;   LEAD REVISION N/A 07/25/2014   Procedure: LEAD REVISION;  Surgeon: Deboraha Sprang, MD;  Location: [facility] CATH LAB;  Service: Cardiovascular;  Laterality: N/A;   PERMANENT PACEMAKER INSERTION N/A 07/24/2014   Procedure: PERMANENT PACEMAKER INSERTION;  Surgeon: [name], MD;  Location: [facility] CATH LAB;  Service: Cardiovascular;  Laterality: N/A;   RADIOLOGY WITH ANESTHESIA N/A 03/28/2022    Procedure: IR WITH ANESTHESIA;  Surgeon: Radiologist, Medication, MD;  Location: [place];  Service: Radiology;  Laterality: N/A;   VAGINAL HYSTERECTOMY  04/20/2016   Procedure: HYSTERECTOMY VAGINAL;  Surgeon: [name], DO;  Location: [facility];  Service: Gynecology;;   Past Medical History:  Diagnosis Date   Back pain    Broken arm    age 24 years old broke right arm after a fall   Gall bladder stones    Gestational diabetes    Hemorrhoid    Hx of cardiac cath    [facility] 07/[facility]/14 at [facility] - no significant CAD   Hx of cardiovascular stress test    Myoview 11/14:  No ischemia   Hx of echocardiogram    Echo 07/23/14 - at [facility]:  EF 65-70%, impaired relaxation, mild LAE, mild MR, mild AI, mild elevated PASP (44.2 mmHg)   Hyperlipidemia  Hypertension    LBBB (left bundle branch block)    Low blood pressure    Nerve damage    right arm   Pain    Left upper chest s/p pacer maker insertion site 07/2014   S/P cardiac pacemaker procedure 07/2014   St. Jude - Dr. Lovena Le   Stroke [facility])    patient was unaware noted prior to pacermaker placement unknown when it occurred   BP (!) 145/80 Comment: right lower wrist  Pulse 71   Ht '5\' 4"'$  (1.626 m)   Wt 189 lb (85.7 kg)   SpO2 97%   BMI 32.44 kg/m   Opioid Risk Score:   Fall Risk Score:  `1  Depression screen PHQ 2/9     05/10/2022    1:32 PM  Depression screen PHQ 2/9  Decreased Interest 1  Down, Depressed, Hopeless 1  PHQ - 2 Score 2  Altered sleeping 0  Tired, decreased energy 1  Change in appetite 0  Feeling bad or failure about yourself  0  Trouble concentrating 0  Moving slowly or fidgety/restless 3  Suicidal thoughts 0  PHQ-9 Score 6    Review of Systems  Cardiovascular:  Positive for palpitations.  Genitourinary:  Positive for frequency.  Musculoskeletal:  Positive for gait problem.       Pain right wrist, hand, finger, right arm  Neurological:  Positive for weakness.       Objective:   Physical Exam Vitals and  nursing note reviewed.  Constitutional:      Appearance: Normal appearance.  Cardiovascular:     Rate and Rhythm: Normal rate and regular rhythm.     Pulses: Normal pulses.     Heart sounds: Normal heart sounds.  Pulmonary:     Effort: Pulmonary effort is normal.     Breath sounds: Normal breath sounds.  Musculoskeletal:     Cervical back: Normal range of motion and neck supple.  Neurological:     Mental Status: She is alert.     Comments: Alert and Oriented x1  Psychiatric:        Mood and Affect: Mood normal.        Behavior: Behavior normal.         Assessment & Plan:  Acute Ischemic left Middle Cerebral Artery: [place]. She has a scheduled appointment with Dr Leonie Man. Continue medication regimen. Continue to Monitor.  2.  Essential Hypertension: Continue current medication Regimen. Husband states she has a F/U appointment with PCP. Continue to Monitor.  3. Hyperlipidemia.Continue current medication regimen. Continue to monitor. She has a scheduled appointment with her PCP.   F/U Dr Ranell Patrick in 4- 6 weeks

## 2022-05-19 ENCOUNTER — Ambulatory Visit (INDEPENDENT_AMBULATORY_CARE_PROVIDER_SITE_OTHER): Payer: Medicare HMO

## 2022-05-19 DIAGNOSIS — I442 Atrioventricular block, complete: Secondary | ICD-10-CM

## 2022-05-19 LAB — CUP PACEART REMOTE DEVICE CHECK
Battery Remaining Longevity: 36 mo
Battery Remaining Percentage: 28 %
Battery Voltage: 2.95 V
Brady Statistic AP VP Percent: 20 %
Brady Statistic AP VS Percent: 29 %
Brady Statistic AS VP Percent: 26 %
Brady Statistic AS VS Percent: 24 %
Brady Statistic RA Percent Paced: 49 %
Brady Statistic RV Percent Paced: 47 %
Date Time Interrogation Session: 20231109020014
Implantable Lead Connection Status: 753985
Implantable Lead Connection Status: 753985
Implantable Lead Implant Date: 20160114
Implantable Lead Implant Date: 20160114
Implantable Lead Location: 753859
Implantable Lead Location: 753860
Implantable Pulse Generator Implant Date: 20160114
Lead Channel Impedance Value: 490 Ohm
Lead Channel Impedance Value: 590 Ohm
Lead Channel Pacing Threshold Amplitude: 0.625 V
Lead Channel Pacing Threshold Amplitude: 0.75 V
Lead Channel Pacing Threshold Pulse Width: 0.4 ms
Lead Channel Pacing Threshold Pulse Width: 0.4 ms
Lead Channel Sensing Intrinsic Amplitude: 12 mV
Lead Channel Sensing Intrinsic Amplitude: 4.5 mV
Lead Channel Setting Pacing Amplitude: 1 V
Lead Channel Setting Pacing Amplitude: 1.625
Lead Channel Setting Pacing Pulse Width: 0.4 ms
Lead Channel Setting Sensing Sensitivity: 4 mV
Pulse Gen Model: 2240
Pulse Gen Serial Number: 7706772

## 2022-05-27 NOTE — Progress Notes (Signed)
Remote pacemaker transmission.   

## 2022-06-21 ENCOUNTER — Telehealth: Payer: Self-pay | Admitting: Neurology

## 2022-06-21 NOTE — Telephone Encounter (Signed)
LVM and sent mychart msg informing pt of need to reschedule 12/13 appointment - MD out  There is a spot held on 12/14 at 1:30 pm with Dr. Leonie Man if she is available for that time

## 2022-06-22 ENCOUNTER — Inpatient Hospital Stay: Payer: Medicare HMO | Admitting: Neurology

## 2022-06-30 DIAGNOSIS — E7800 Pure hypercholesterolemia, unspecified: Secondary | ICD-10-CM | POA: Diagnosis not present

## 2022-06-30 DIAGNOSIS — M1990 Unspecified osteoarthritis, unspecified site: Secondary | ICD-10-CM | POA: Diagnosis not present

## 2022-06-30 DIAGNOSIS — I10 Essential (primary) hypertension: Secondary | ICD-10-CM | POA: Diagnosis not present

## 2022-06-30 DIAGNOSIS — M8580 Other specified disorders of bone density and structure, unspecified site: Secondary | ICD-10-CM | POA: Diagnosis not present

## 2022-07-01 DIAGNOSIS — I69328 Other speech and language deficits following cerebral infarction: Secondary | ICD-10-CM | POA: Diagnosis not present

## 2022-07-01 DIAGNOSIS — R739 Hyperglycemia, unspecified: Secondary | ICD-10-CM | POA: Diagnosis not present

## 2022-07-01 DIAGNOSIS — I10 Essential (primary) hypertension: Secondary | ICD-10-CM | POA: Diagnosis not present

## 2022-07-01 DIAGNOSIS — G479 Sleep disorder, unspecified: Secondary | ICD-10-CM | POA: Diagnosis not present

## 2022-07-01 DIAGNOSIS — E569 Vitamin deficiency, unspecified: Secondary | ICD-10-CM | POA: Diagnosis not present

## 2022-07-01 DIAGNOSIS — I447 Left bundle-branch block, unspecified: Secondary | ICD-10-CM | POA: Diagnosis not present

## 2022-07-01 DIAGNOSIS — K219 Gastro-esophageal reflux disease without esophagitis: Secondary | ICD-10-CM | POA: Diagnosis not present

## 2022-07-01 DIAGNOSIS — E785 Hyperlipidemia, unspecified: Secondary | ICD-10-CM | POA: Diagnosis not present

## 2022-07-01 DIAGNOSIS — I69351 Hemiplegia and hemiparesis following cerebral infarction affecting right dominant side: Secondary | ICD-10-CM | POA: Diagnosis not present

## 2022-07-15 ENCOUNTER — Other Ambulatory Visit: Payer: Self-pay

## 2022-07-15 NOTE — Patient Outreach (Signed)
First telephone outreach attempt to obtain mRS. No answer. Left message for returned call.  Shariq Puig [facility]-Care Management Assistant [phone]  

## 2022-07-18 ENCOUNTER — Other Ambulatory Visit: Payer: Self-pay

## 2022-07-18 NOTE — Patient Outreach (Signed)
Second telephone outreach attempt to obtain mRS. No answer. Left message for returned call.  Marquez Ceesay [facility]-Care Management Assistant [phone]  

## 2022-07-20 ENCOUNTER — Other Ambulatory Visit: Payer: Self-pay

## 2022-07-20 NOTE — Patient Outreach (Signed)
3 outreach attempts were completed to obtain mRs. mRs could not be obtained because patient never returned my calls. mRs=7    Elaine Ford Care Management Assistant [phone]  

## 2022-07-26 ENCOUNTER — Encounter: Payer: Medicare HMO | Attending: Registered Nurse | Admitting: Physical Medicine and Rehabilitation

## 2022-07-26 ENCOUNTER — Encounter: Payer: Self-pay | Admitting: Physical Medicine and Rehabilitation

## 2022-07-26 VITALS — BP 196/103 | HR 80 | Ht 64.0 in

## 2022-07-26 DIAGNOSIS — M79601 Pain in right arm: Secondary | ICD-10-CM | POA: Diagnosis not present

## 2022-07-26 DIAGNOSIS — I63512 Cerebral infarction due to unspecified occlusion or stenosis of left middle cerebral artery: Secondary | ICD-10-CM | POA: Diagnosis not present

## 2022-07-26 DIAGNOSIS — I10 Essential (primary) hypertension: Secondary | ICD-10-CM | POA: Diagnosis not present

## 2022-07-26 MED ORDER — VITAMIN D (ERGOCALCIFEROL) 1.25 MG (50000 UNIT) PO CAPS: 50000.0000 [IU] | ORAL_CAPSULE | ORAL | 0 refills | Status: AC

## 2022-07-26 MED ORDER — LIDOCAINE 5 % EX PTCH: 1.0000 | MEDICATED_PATCH | CUTANEOUS | 0 refills | Status: AC

## 2022-07-26 MED ORDER — ATORVASTATIN CALCIUM 40 MG PO TABS: 40.0000 mg | ORAL_TABLET | Freq: Every day | ORAL | 3 refills | Status: AC

## 2022-07-26 MED ORDER — DICLOFENAC SODIUM 1 % EX GEL: 2.0000 g | Freq: Four times a day (QID) | CUTANEOUS | 3 refills | Status: AC

## 2022-07-26 NOTE — Patient Instructions (Signed)
Foods that may reduce pain: 1) Ginger (especially studied for arthritis)- reduce leukotriene production to decrease inflammation 2) Blueberries- high in phytonutrients that decrease inflammation 3) Salmon- marine omega-3s reduce joint swelling and pain 4) Pumpkin seeds- reduce inflammation 5) dark chocolate- reduces inflammation 6) turmeric- reduces inflammation 7) tart cherries - reduce pain and stiffness 8) extra virgin olive oil - its compound olecanthal helps to block prostaglandins  9) chili peppers- can be eaten or applied topically via capsaicin 10) mint- helpful for headache, muscle aches, joint pain, and itching 11) garlic- reduces inflammation  Link to further information on diet for chronic pain: http://www.randall.com/   Turmeric to reduce inflammation--can be used in cooking or taken as a supplement.  Benefits of turmeric:  -Highly anti-inflammatory  -Increases antioxidants  -Improves memory, attention, brain disease  -Lowers risk of heart disease  -May help prevent cancer  -Decreases pain  -Alleviates depression  -Delays aging and decreases risk of chronic disease  -Consume with black pepper to increase absorption    Turmeric Milk Recipe:  1 cup milk  1 tsp turmeric  1 tsp cinnamon  1 tsp grated ginger (optional)  Black pepper (boosts the anti-inflammatory properties of turmeric).  1 tsp honey   Foods for blood pressure:  1) citrus foods- high in vitamins and minerals 2) salmon and other fatty fish - reduces inflammation and oxylipins 3) swiss chard (leafy green)- high level of nitrates 4) pumpkin seeds- one of the best natural sources of magnesium 5) Beans and lentils- high in fiber, magnesium, and potassium 6) Berries- high in flavonoids 7) Amaranth (whole grain, can be cooked similarly to rice and oats)- high in magnesium and fiber 8) Pistachios- even more effective at  reducing BP than other nuts 9) Carrots- high in phenolic compounds that relax blood vessels and reduce inflammation 10) Celery- contain phthalides that relax tissues of arterial walls 11) Tomatoes- can also improve cholesterol and reduce risk of heart disease 12) Broccoli- good source of magnesium, calcium, and potassium 13) Greek yogurt: high in potassium and calcium 14) Herbs and spices: Celery seed, cilantro, saffron, lemongrass, black cumin, ginseng, cinnamon, cardamom, sweet basil, and ginger 15) Chia and flax seeds- also help to lower cholesterol and blood sugar 16) Beets- high levels of nitrates that relax blood vessels  17) spinach and bananas- high in potassium  -Provided lise of supplements that can help with hypertension:  1) magnesium: one high quality brand is Bioptemizers since it contains all 7 types of magnesium, otherwise over the counter magnesium gluconate '400mg'$  is a good option 2) B vitamins 3) vitamin D 4) potassium 5) CoQ10 6) L-arginine 7) Vitamin C 8) Beetroot -Educated that goal BP is 120/80. -Made goal to incorporate some of the above foods into diet.

## 2022-07-26 NOTE — Progress Notes (Signed)
+  Subjective:    Patient ID: LAKISHIA Ford, female    DOB: 28-Apr-1946, 77 y.o.   MRN: [id number]  HPI: Elaine Ford is a 77 y.o. female who is here for follow-up appointment for follow up of her  Acute Ischemic left Middle Cerebral Artery, Essential Hypertension and Hyperlipidemia. She was  brought to [facility] on 03/28/2022 via EMS  Dr. Cheral Marker H&P   Elaine Ford is an 77 y.o. female with a PMHx of gallstones, gestational diabetes, HLD, HTN LBBB s/p pacemaker placement and hypotension, presenting to the ED via EMS with acute onset of right hemiplegia and aphasia. LKN was 1000. She was found slumped over to the right on her couch at home by her husband. Husband states that during a work up for another condition brain imaging was performed, revealing an old stroke, but patient was never aware of any stroke-like symptoms in the past. She is not on a blood thinner.    On arrival to the ED, she continued to be aphasic, with dense right sided weakness.  CT Head WO Contrast:  IMPRESSION: 1. Hyperdensity in the left MCA near the bifurcation and in the proximal M2 branches, which may represent atherosclerotic disease and/or thrombus. 2. ASPECTS is 10  CTA:  IMPRESSION: 1. Occlusion of the left distal M1 with minimal flow in the proximal left M2 branches and poor perfusion of the remainder of the left MCA territory. This correlates with the hyperdensity seen on the same-day CT head. 2. Multifocal stenosis in the bilateral PCAs, which is severe at the origin of one of the left P3 branches, moderate in the proximal right P1 and throughout the P2, and mild in the left P2. Poor perfusion of the right P3 segments. 3. Additional mild stenosis in the distal right M1, proximal M2, and in the left V4. 4. Mild stenosis of the proximal right V1 segment. No other hemodynamically significant stenosis in the neck.  CT Head WO Contrast IMPRESSION: Hyperdensity within the left caudate and lentiform  nuclei, which may reflect contrast staining and/or hemorrhage. Moderate-volume hyperdensity along the left cerebral hemisphere, likely reflecting a combination of extravasated contrast and subarachnoid hemorrhage. Findings have progressed from the post-procedure head CT performed earlier today at 3:29 p.m. There is no significant associated mass effect at this time. No evidence of hydrocephalus.   The thrombus/embolus which was previously present within the M1 left middle cerebral artery now appears to have migrated more distally into an M2 left MCA vessel.   She was maintained Plavix and ASA DAPT x 3 weeks then ASA alone.   She was admitted to inpatient rehabilitation on 04/06/2022 and discharged home on 04/27/2022. She is receiving [place] with [place]. . She is alert x 1, she stated her name, she has mixed aphasia with responses.She is able to follow directions.   Husband states she has pain in her right hand, her husband states this is not new. We will continue to monitor.   She arrived in wheelchair , husband states she walks in the home with walker.   Husband in room.    She is receiving home PT  She only received two sessions of home OT and one of home SLP BP is elevated 196/103 Husband takes BP daily at home. Her husband says she had white coat syndrome.  BP is well controlled at home.  Has pain along right arm.  Wrist and fingers are hurting.  Shoulder hurts when she lifts  it  Pain Inventory Average Pain 8 Pain Right Now 8 My pain is constant and sharp  LOCATION OF PAIN  right arm, wrist, fingers & shoulder BOWEL Number of stools per week: 14   BLADDER Pads  Bladder incontinence Yes    Mobility walk with assistance use a walker ability to climb steps?  yes do you drive?  no use a wheelchair needs help with transfers Do you have any goals in this area?  yes  Function retired I need assistance with the following:  dressing,  bathing, meal prep, household duties, and shopping Do you have any goals in this area?  yes  Neuro/Psych weakness trouble walking anxiety  Prior Studies Any changes since last visit?  no  Physicians involved in your care Any changes since last visit?  no   Family History  Problem Relation Age of Onset   Heart attack Father    Stroke Father    Heart disease Brother    Social History   Socioeconomic History   Marital status: Married    Spouse name: Not on file   Number of children: Not on file   Years of education: Not on file   Highest education level: Not on file  Occupational History   Not on file  Tobacco Use   Smoking status: Never   Smokeless tobacco: Never  Vaping Use   Vaping Use: Never used  Substance and Sexual Activity   Alcohol use: No   Drug use: No   Sexual activity: Yes    Birth control/protection: Post-menopausal  Other Topics Concern   Not on file  Social History Narrative   Not on file   Social Determinants of Health   Financial Resource Strain: Not on file  Food Insecurity: Not on file  Transportation Needs: Not on file  Physical Activity: Not on file  Stress: Not on file  Social Connections: Not on file   Past Surgical History:  Procedure Laterality Date   COLONOSCOPY     DILATATION & CURETTAGE/HYSTEROSCOPY WITH MYOSURE N/A 02/29/2016   Procedure: [place];  Surgeon: [name], DO;  Location: [facility];  Service: Gynecology;  Laterality: N/A;  patient has a pacemaker    GALLBLADDER SURGERY     IR 3D INDEPENDENT WKST  03/28/2022   IR PERCUTANEOUS ART THROMBECTOMY/INFUSION INTRACRANIAL INC DIAG ANGIO  03/28/2022   IR US GUIDE VASC ACCESS RIGHT  03/28/2022   LAPAROSCOPIC BILATERAL SALPINGO OOPHERECTOMY  04/20/2016   Procedure: LAPAROSCOPIC BILATERAL SALPINGO OOPHORECTOMY;  Surgeon: [name], DO;  Location: [facility];  Service: Gynecology;;   LEAD REVISION N/A 07/25/2014   Procedure: LEAD REVISION;   Surgeon: Deboraha Sprang, MD;  [facility];  Service: Cardiovascular;  Laterality: N/A;   PERMANENT PACEMAKER INSERTION N/A 07/24/2014   Procedure: PERMANENT PACEMAKER INSERTION;  Surgeon: [name], MD;  Location: [facility] CATH LAB;  Service: Cardiovascular;  Laterality: N/A;   RADIOLOGY WITH ANESTHESIA N/A 03/28/2022   Procedure: IR WITH ANESTHESIA;  Surgeon: Radiologist, Medication, MD;  Location: [name];  Service: Radiology;  Laterality: N/A;   VAGINAL HYSTERECTOMY  04/20/2016   Procedure: HYSTERECTOMY VAGINAL;  Surgeon: [name], DO;  Location: [facility];  Service: Gynecology;;   Past Medical History:  Diagnosis Date   Back pain    Broken arm    age 59 years old broke right arm after a fall   Gall bladder stones    Gestational diabetes    Hemorrhoid  Hx of cardiac cath    [facility] 07/24/14 at [facility] - no significant CAD   Hx of cardiovascular stress test    Myoview 11/14:  No ischemia   Hx of echocardiogram    Echo 07/23/14 - at [facility]:  EF 65-70%, impaired relaxation, mild LAE, mild MR, mild AI, mild elevated PASP (44.2 mmHg)   Hyperlipidemia    Hypertension    LBBB (left bundle branch block)    Low blood pressure    Nerve damage    right arm   Pain    Left upper chest s/p pacer maker insertion site 07/2014   S/P cardiac pacemaker procedure 07/2014   St. Jude - Dr. Lovena Le   Stroke [facility])    patient was unaware noted prior to pacermaker placement unknown when it occurred   There were no vitals taken for this visit.  Opioid Risk Score:   Fall Risk Score:  `1  Depression screen PHQ 2/9     05/10/2022    1:32 PM  Depression screen PHQ 2/9  Decreased Interest 1  Down, Depressed, Hopeless 1  PHQ - 2 Score 2  Altered sleeping 0  Tired, decreased energy 1  Change in appetite 0  Feeling bad or failure about yourself  0  Trouble concentrating 0  Moving slowly or fidgety/restless 3  Suicidal thoughts 0  PHQ-9 Score 6    Review of Systems  Cardiovascular:  Positive for  palpitations.  Genitourinary:  Positive for frequency.  Musculoskeletal:  Positive for gait problem.       Pain right wrist, hand, finger, right arm  Neurological:  Positive for weakness.       Objective:   Physical Exam Vitals and nursing note reviewed.  Constitutional:      Appearance: Normal appearance.  Cardiovascular:     Rate and Rhythm: Normal rate and regular rhythm.     Pulses: Normal pulses.     Heart sounds: Normal heart sounds.  Pulmonary:     Effort: Pulmonary effort is normal.     Breath sounds: Normal breath sounds.  Musculoskeletal:     Cervical back: Normal range of motion and neck supple. Right arm swollen and flaccid  Neurological:     Mental Status: She is alert.     Comments: Alert and Oriented x3, improved spech Psychiatric:        Mood and Affect: Mood normal.        Behavior: Behavior normal.        Assessment & Plan:  Acute Ischemic left Middle Cerebral Artery: Elaine Ford with [place]. She has a scheduled appointment with Dr Leonie Man. Continue medication regimen. Continue to Monitor.  2.  Essential Hypertension: Continue current medication Regimen. Husband states she has a F/U appointment with PCP. Continue to Monitor. Discussed current elevated BP.  3. Hyperlipidemia.Continue current medication regimen. Continue to monitor. She has a scheduled appointment with her PCP.  4. Right upper extremity swelling: discussed getting an [place] to confirm there are no clots but she defers 5. Right shoulder pain: lidocaine patch and voltaren gel ordered -Discussed current symptoms of pain and history of pain.  -Discussed benefits of exercise in reducing pain. -Discussed following foods that may reduce pain: 1) Ginger (especially studied for arthritis)- reduce leukotriene production to decrease inflammation 2) Blueberries- high in phytonutrients that decrease inflammation 3) Salmon- marine omega-3s reduce joint swelling and pain 4) Pumpkin  seeds- reduce inflammation 5) dark chocolate- reduces inflammation 6) turmeric- reduces inflammation 7)  tart cherries - reduce pain and stiffness 8) extra virgin olive oil - its compound olecanthal helps to block prostaglandins  9) chili peppers- can be eaten or applied topically via capsaicin 10) mint- helpful for headache, muscle aches, joint pain, and itching 11) garlic- reduces inflammation  Link to further information on diet for chronic pain: [email or web address].[email or web address]  F/U Dr Ranell Patrick in 4- 6 weeks

## 2022-08-03 DIAGNOSIS — E7800 Pure hypercholesterolemia, unspecified: Secondary | ICD-10-CM | POA: Diagnosis not present

## 2022-08-03 DIAGNOSIS — M8580 Other specified disorders of bone density and structure, unspecified site: Secondary | ICD-10-CM | POA: Diagnosis not present

## 2022-08-03 DIAGNOSIS — I10 Essential (primary) hypertension: Secondary | ICD-10-CM | POA: Diagnosis not present

## 2022-08-03 DIAGNOSIS — M1990 Unspecified osteoarthritis, unspecified site: Secondary | ICD-10-CM | POA: Diagnosis not present

## 2022-08-15 ENCOUNTER — Ambulatory Visit: Payer: Medicare HMO | Admitting: Neurology

## 2022-08-15 ENCOUNTER — Encounter: Payer: Self-pay | Admitting: Neurology

## 2022-08-15 VITALS — BP 171/79 | HR 75 | Ht 64.0 in

## 2022-08-15 DIAGNOSIS — I69320 Aphasia following cerebral infarction: Secondary | ICD-10-CM

## 2022-08-15 DIAGNOSIS — I63512 Cerebral infarction due to unspecified occlusion or stenosis of left middle cerebral artery: Secondary | ICD-10-CM

## 2022-08-15 DIAGNOSIS — G8191 Hemiplegia, unspecified affecting right dominant side: Secondary | ICD-10-CM | POA: Diagnosis not present

## 2022-08-15 DIAGNOSIS — M25511 Pain in right shoulder: Secondary | ICD-10-CM | POA: Diagnosis not present

## 2022-08-15 DIAGNOSIS — G8929 Other chronic pain: Secondary | ICD-10-CM

## 2022-08-15 NOTE — Progress Notes (Signed)
[facility] [address] [place]. [place] [address] [phone]       OFFICE FOLLOW-UP NOTE  [name] Date of Birth:  10/07/1945 Medical Record Number:  [id number]   HPI: [name] is a 77 year old Hispanic lady seen today for initial office follow-up visit following hospital admission for stroke in September 2023.She is accompanied by her husband today.  History is obtained from them and review of electronic medical records.  I have personally reviewed pertinent available imaging films in PACS.[name] is a 77 y.o. female with history of gallstones, HLD, HTN LBBB s/p pacemaker placement and hypotension, presenting to the ED via EMS on 03/28/2022 with acute onset of right hemiplegia and aphasia presenting with via EMS after being found slumped towards the right on the couch by her husband. She received TNK and underwent a mechanical thrombectomy for a proximal left M1 occlusion. Repeat CT Head showed extensive stroke in the left MCA territory with cytotoxic edema and mild midline shift. Moved to step down 9/21. Repeat CT 9/22- No progression of large left MCA territory infarct with petechial and subarachnoid hemorrhage.  CT angiogram showed distal left M1 occlusion with minimal flow in the proximal left M2 branches and poor perfusion of the remainder of the left MCA territory.  Multifocal stenosis in bilateral PCAs with severe stenosis at the origin of the P3 branch.  MRI could not be performed due to pacemaker.  2D echo showed ejection fraction 65% with normal left ventricular function.  Pacemaker interrogation showed only seconds of atrial flutter which after discussion with electrophysiology team was not felt to be significant enough to justify long-term anticoagulation.  LDL cholesterol 104 mg percent.  Hemoglobin A1c was 5.5.  Patient was started on aspirin.  She was transferred to inpatient rehab.  Currently at home.  She is still getting physical therapy at  home but has speech therapy and patient therapy.  She has noted improvement in her aphasia and is able to communicate has good days and bad days but still has significant word finding difficulties and paraphasic errors.  Right upper extremity pain has not improved she has significant pain in the right shoulder.  She can barely bend her fingers.  She is able to walk with her walker and her right leg stent has been approved.  He is tolerating aspirin well without bruising or bleeding and her blood pressure is under good control.  She is tolerating Lipitor well without side effects.  He wants help to manage the right shoulder pain and has not yet followed up with the rehabilitation physician. ROS:   14 system review of systems is positive for shoulder pain, numbness, word finding difficulty, speech difficulty, difficulty walking and all other systems negative  PMH:  Past Medical History:  Diagnosis Date   Back pain    Broken arm    age 34 years old broke right arm after a fall   Gall bladder stones    Gestational diabetes    Hemorrhoid    Hx of cardiac cath    [facility] 07/24/14 at [facility] - no significant CAD   Hx of cardiovascular stress test    Myoview 11/14:  No ischemia   Hx of echocardiogram    Echo 07/23/14 - at [facility]:  EF 65-70%, impaired relaxation, mild LAE, mild MR, mild AI, mild elevated PASP (44.2 mmHg)   Hyperlipidemia    Hypertension    LBBB (left bundle branch block)    Low blood  pressure    Nerve damage    right arm   Pain    Left upper chest s/p pacer maker insertion site 07/2014   S/P cardiac pacemaker procedure 07/2014   [employer] - Dr. Lovena Le   Stroke [facility])    patient was unaware noted prior to pacermaker placement unknown when it occurred    Social History:  Social History   Socioeconomic History   Marital status: Married    Spouse name: Not on file   Number of children: Not on file   Years of education: Not on file   Highest education level: Not on file  Occupational  History   Not on file  Tobacco Use   Smoking status: Never   Smokeless tobacco: Never  Vaping Use   Vaping Use: Never used  Substance and Sexual Activity   Alcohol use: No   Drug use: No   Sexual activity: Yes    Birth control/protection: Post-menopausal  Other Topics Concern   Not on file  Social History Narrative   Not on file   Social Determinants of Health   Financial Resource Strain: Not on file  Food Insecurity: Not on file  Transportation Needs: Not on file  Physical Activity: Not on file  Stress: Not on file  Social Connections: Not on file  Intimate Partner Violence: Not on file    Medications:   Current Outpatient Medications on File Prior to Visit  Medication Sig Dispense Refill   amLODipine (NORVASC) 10 MG tablet Take 10 mg by mouth daily.     aspirin 81 MG chewable tablet Chew 1 tablet (81 mg total) by mouth daily. 30 tablet 0   atorvastatin (LIPITOR) 40 MG tablet Take 1 tablet (40 mg total) by mouth daily. 90 tablet 3   cetaphil (CETAPHIL) lotion Apply topically 2 (two) times daily. 236 mL 0   diclofenac Sodium (VOLTAREN ARTHRITIS PAIN) 1 % GEL Apply 2 g topically 4 (four) times daily. 50 g 3   lidocaine (LIDODERM) 5 % Place 1 patch onto the skin daily. Remove & Discard patch within 12 hours or as directed by MD 30 patch 0   metoprolol succinate (TOPROL-XL) 100 MG 24 hr tablet Take 100 mg by mouth daily.     nitroGLYCERIN (NITROSTAT) 0.4 MG SL tablet Place 0.4 mg under the tongue every 5 (five) minutes as needed for chest pain.     selenium sulfide (SELSUN) 1 % LOTN Apply 1 Application topically daily. To hair line and plaques on the back of her neck. Keep it on for about 15 minutes or scrub to get the flakes off, then wash off and apply a thin layer of cetaphil lotion. 325 mL 0   Vitamin D, Ergocalciferol, (DRISDOL) 1.25 MG (50000 UNIT) CAPS capsule Take 1 capsule (50,000 Units total) by mouth every 7 (seven) days. (On Mondays) 7 capsule 0   No current  facility-administered medications on file prior to visit.    Allergies:   Allergies  Allergen Reactions   Ace Inhibitors Cough   Codeine Other (See Comments)    Reaction:  Unknown    Hydrocodone-Acetaminophen Other (See Comments)    Reaction:  Unknown    Lisinopril-Hydrochlorothiazide Cough   Oxycodone-Acetaminophen Nausea And Vomiting    Physical Exam General: Obese elderly Hispanic lady seated, in no evident distress Head: head normocephalic and atraumatic.  Neck: supple with no carotid or supraclavicular bruits Cardiovascular: regular rate and rhythm, no murmurs Musculoskeletal: no deformity Skin:  no rash/petichiae Vascular:  Normal pulses all extremities Vitals:   08/15/22 1047  BP: (!) 171/79  Pulse: 75   Neurologic Exam Mental Status: Awake and fully alert. Oriented to place and person.  Mild expressive and receptive aphasia with paraphasic errors and word finding difficulties.  Difficulty with repetition. Cranial Nerves: Fundoscopic exam reveals sharp disc margins. Pupils equal, briskly reactive to light. Extraocular movements full without nystagmus. Visual fields full to confrontation. Hearing intact. Facial sensation intact. Face, tongue, palate moves normally and symmetrically.  Motor: Normal strength tone reflexes and coordination on the left.  Right hemiplegia with right upper extremity 1/5 strength with only trace finger flexion movements.  Right lower extremity 4/5 with mild ankle dorsiflexor weakness.   sensory.: intact to touch ,pinprick .position and vibratory sensation.  Coordination: Rapid alternating movements normal in all extremities. Finger-to-nose and heel-to-shin performed accurately bilaterally. Gait and Station: Deferred as patient did not bring her walker. Reflexes: 2+ and asymmetric and brisker on the right. Toes downgoing.   NIHSS  9 Modified Rankin  3   ASSESSMENT: 77 year old Hispanic lady with left middle cerebral artery infarct and September  2023 secondary to left middle cerebral artery occlusion treated with thrombolysis with IV TNK followed by successful mechanical thrombectomy with TICI 3 revascularization but unfortunately she still has significant residual aphasia and right hemiparesis vascular risk factors of hypertension, hyperlipidemia, obesity.  She also has significant poststroke right shoulder pain     PLAN :I had a long d/w patient about her  recent cryptogenic stroke, risk for recurrent stroke/TIAs, personally independently reviewed imaging studies and stroke evaluation results and answered questions.Continue aspirin 81 mg daily  for secondary stroke prevention and maintain strict control of hypertension with blood pressure goal below 130/90, diabetes with hemoglobin A1c goal below 6.5% and lipids with LDL cholesterol goal below 70 mg/dL. I also advised the patient to eat a healthy diet with plenty of whole grains, cereals, fruits and vegetables, exercise regularly and maintain ideal body weight .continue ongoing physical therapy and use of walker for ambulation at all times.  We discussed fall safety precautions.  Referred to rehab MD for right shoulder pain management followup in the future with my nurse practitioner in 3 months or call earlier if necessary.Greater than 50% of time during this 35 minute visit was spent on counseling,explanation of diagnosis of left MCA infarct and aphasia and hemiparesis, planning of further management, discussion with patient and family and coordination of care Antony Contras, MD Note: This document was prepared with digital dictation and possible smart phrase technology. Any transcriptional errors that result from this process are unintentional

## 2022-08-15 NOTE — Patient Instructions (Signed)
I had a long d/w patient about her  recent cryptogenic stroke, risk for recurrent stroke/TIAs, personally independently reviewed imaging studies and stroke evaluation results and answered questions.Continue aspirin 81 mg daily  for secondary stroke prevention and maintain strict control of hypertension with blood pressure goal below 130/90, diabetes with hemoglobin A1c goal below 6.5% and lipids with LDL cholesterol goal below 70 mg/dL. I also advised the patient to eat a healthy diet with plenty of whole grains, cereals, fruits and vegetables, exercise regularly and maintain ideal body weight .continue ongoing physical therapy and use of walker for ambulation at all times.  We discussed fall safety precautions.  Referred to rehab MD for right shoulder pain management followup in the future with my nurse practitioner in 3 months or call earlier if necessary.  Stroke Prevention Some medical conditions and behaviors can lead to a higher chance of having a stroke. You can help prevent a stroke by eating healthy, exercising, not smoking, and managing any medical conditions you have. Stroke is a leading cause of functional impairment. Primary prevention is particularly important because a majority of strokes are first-time events. Stroke changes the lives of not only those who experience a stroke but also their family and other caregivers. How can this condition affect me? A stroke is a medical emergency and should be treated right away. A stroke can lead to brain damage and can sometimes be life-threatening. If a person gets medical treatment right away, there is a better chance of surviving and recovering from a stroke. What can increase my risk? The following medical conditions may increase your risk of a stroke: Cardiovascular disease. High blood pressure (hypertension). Diabetes. High cholesterol. Sickle cell disease. Blood clotting disorders (hypercoagulable state). Obesity. Sleep disorders  (obstructive sleep apnea). Other risk factors include: Being older than age 62. Having a history of blood clots, stroke, or mini-stroke (transient ischemic attack, TIA). Genetic factors, such as race, ethnicity, or a family history of stroke. Smoking cigarettes or using other tobacco products. Taking birth control pills, especially if you also use tobacco. Heavy use of alcohol or drugs, especially cocaine and methamphetamine. Physical inactivity. What actions can I take to prevent this? Manage your health conditions High cholesterol levels. Eating a healthy diet is important for preventing high cholesterol. If cholesterol cannot be managed through diet alone, you may need to take medicines. Take any prescribed medicines to control your cholesterol as told by your health care provider. Hypertension. To reduce your risk of stroke, try to keep your blood pressure below 130/80. Eating a healthy diet and exercising regularly are important for controlling blood pressure. If these steps are not enough to manage your blood pressure, you may need to take medicines. Take any prescribed medicines to control hypertension as told by your health care provider. Ask your health care provider if you should monitor your blood pressure at home. Have your blood pressure checked every year, even if your blood pressure is normal. Blood pressure increases with age and some medical conditions. Diabetes. Eating a healthy diet and exercising regularly are important parts of managing your blood sugar (glucose). If your blood sugar cannot be managed through diet and exercise, you may need to take medicines. Take any prescribed medicines to control your diabetes as told by your health care provider. Get evaluated for obstructive sleep apnea. Talk to your health care provider about getting a sleep evaluation if you snore a lot or have excessive sleepiness. Make sure that any other medical conditions you have,  such as  atrial fibrillation or atherosclerosis, are managed. Nutrition Follow instructions from your health care provider about what to eat or drink to help manage your health condition. These instructions may include: Reducing your daily calorie intake. Limiting how much salt (sodium) you use to 1,500 milligrams (mg) each day. Using only healthy fats for cooking, such as olive oil, canola oil, or sunflower oil. Eating healthy foods. You can do this by: Choosing foods that are high in fiber, such as whole grains, and fresh fruits and vegetables. Eating at least 5 servings of fruits and vegetables a day. Try to fill one-half of your plate with fruits and vegetables at each meal. Choosing lean protein foods, such as lean cuts of meat, poultry without skin, fish, tofu, beans, and nuts. Eating low-fat dairy products. Avoiding foods that are high in sodium. This can help lower blood pressure. Avoiding foods that have saturated fat, trans fat, and cholesterol. This can help prevent high cholesterol. Avoiding processed and prepared foods. Counting your daily carbohydrate intake.  Lifestyle If you drink alcohol: Limit how much you have to: 0-1 drink a day for women who are not pregnant. 0-2 drinks a day for men. Know how much alcohol is in your drink. In the [place] one drink equals one 12 oz bottle of beer (331m), one 5 oz glass of wine (1467m, or one 1 oz glass of hard liquor (4450m Do not use any products that contain nicotine or tobacco. These products include cigarettes, chewing tobacco, and vaping devices, such as e-cigarettes. If you need help quitting, ask your health care provider. Avoid secondhand smoke. Do not use drugs. Activity  Try to stay at a healthy weight. Get at least 30 minutes of exercise on most days, such as: Fast walking. Biking. Swimming. Medicines Take over-the-counter and prescription medicines only as told by your health care provider. Aspirin or blood thinners  (antiplatelets or anticoagulants) may be recommended to reduce your risk of forming blood clots that can lead to stroke. Avoid taking birth control pills. Talk to your health care provider about the risks of taking birth control pills if: You are over 35 85ars old. You smoke. You get very bad headaches. You have had a blood clot. Where to find more information American Stroke Association: www.strokeassociation.org Get help right away if: You or a loved one has any symptoms of a stroke. "BE FAST" is an easy way to remember the main warning signs of a stroke: B - Balance. Signs are dizziness, sudden trouble walking, or loss of balance. E - Eyes. Signs are trouble seeing or a sudden change in vision. F - Face. Signs are sudden weakness or numbness of the face, or the face or eyelid drooping on one side. A - Arms. Signs are weakness or numbness in an arm. This happens suddenly and usually on one side of the body. S - Speech. Signs are sudden trouble speaking, slurred speech, or trouble understanding what people say. T - Time. Time to call emergency services. Write down what time symptoms started. You or a loved one has other signs of a stroke, such as: A sudden, severe headache with no known cause. Nausea or vomiting. Seizure. These symptoms may represent a serious problem that is an emergency. Do not wait to see if the symptoms will go away. Get medical help right away. Call your local emergency services (911 in the [place] Do not drive yourself to the hospital. Summary You can help to prevent a stroke by eating healthy,  exercising, not smoking, limiting alcohol intake, and managing any medical conditions you may have. Do not use any products that contain nicotine or tobacco. These include cigarettes, chewing tobacco, and vaping devices, such as e-cigarettes. If you need help quitting, ask your health care provider. Remember "BE FAST" for warning signs of a stroke. Get help right away if you or a  loved one has any of these signs. This information is not intended to replace advice given to you by your health care provider. Make sure you discuss any questions you have with your health care provider. Document Revised: 01/27/2020 Document Reviewed: 01/27/2020 Elsevier Patient Education  [place].

## 2022-08-18 ENCOUNTER — Ambulatory Visit (INDEPENDENT_AMBULATORY_CARE_PROVIDER_SITE_OTHER): Payer: Medicare HMO

## 2022-08-18 DIAGNOSIS — I442 Atrioventricular block, complete: Secondary | ICD-10-CM

## 2022-08-18 LAB — CUP PACEART REMOTE DEVICE CHECK
Battery Remaining Longevity: 32 mo
Battery Remaining Percentage: 25 %
Battery Voltage: 2.93 V
Brady Statistic AP VP Percent: 26 %
Brady Statistic AP VS Percent: 28 %
Brady Statistic AS VP Percent: 26 %
Brady Statistic AS VS Percent: 20 %
Brady Statistic RA Percent Paced: 53 %
Brady Statistic RV Percent Paced: 52 %
Date Time Interrogation Session: 20240208020019
Implantable Lead Connection Status: 753985
Implantable Lead Connection Status: 753985
Implantable Lead Implant Date: 20160114
Implantable Lead Implant Date: 20160114
Implantable Lead Location: 753859
Implantable Lead Location: 753860
Implantable Pulse Generator Implant Date: 20160114
Lead Channel Impedance Value: 450 Ohm
Lead Channel Impedance Value: 580 Ohm
Lead Channel Pacing Threshold Amplitude: 0.625 V
Lead Channel Pacing Threshold Amplitude: 0.875 V
Lead Channel Pacing Threshold Pulse Width: 0.4 ms
Lead Channel Pacing Threshold Pulse Width: 0.4 ms
Lead Channel Sensing Intrinsic Amplitude: 12 mV
Lead Channel Sensing Intrinsic Amplitude: 3.8 mV
Lead Channel Setting Pacing Amplitude: 1.125
Lead Channel Setting Pacing Amplitude: 1.625
Lead Channel Setting Pacing Pulse Width: 0.4 ms
Lead Channel Setting Sensing Sensitivity: 4 mV
Pulse Gen Model: 2240
Pulse Gen Serial Number: 7706772

## 2022-09-08 NOTE — Progress Notes (Signed)
Remote pacemaker transmission.   

## 2022-11-17 ENCOUNTER — Ambulatory Visit (INDEPENDENT_AMBULATORY_CARE_PROVIDER_SITE_OTHER): Payer: Medicare HMO

## 2022-11-17 DIAGNOSIS — I442 Atrioventricular block, complete: Secondary | ICD-10-CM

## 2022-11-17 LAB — CUP PACEART REMOTE DEVICE CHECK
Battery Remaining Longevity: 30 mo
Battery Remaining Percentage: 23 %
Battery Voltage: 2.92 V
Brady Statistic AP VP Percent: 34 %
Brady Statistic AP VS Percent: 23 %
Brady Statistic AS VP Percent: 26 %
Brady Statistic AS VS Percent: 17 %
Brady Statistic RA Percent Paced: 57 %
Brady Statistic RV Percent Paced: 59 %
Date Time Interrogation Session: 20240509052402
Implantable Lead Connection Status: 753985
Implantable Lead Connection Status: 753985
Implantable Lead Implant Date: 20160114
Implantable Lead Implant Date: 20160114
Implantable Lead Location: 753859
Implantable Lead Location: 753860
Implantable Pulse Generator Implant Date: 20160114
Lead Channel Impedance Value: 450 Ohm
Lead Channel Impedance Value: 560 Ohm
Lead Channel Pacing Threshold Amplitude: 0.625 V
Lead Channel Pacing Threshold Amplitude: 0.875 V
Lead Channel Pacing Threshold Pulse Width: 0.4 ms
Lead Channel Pacing Threshold Pulse Width: 0.4 ms
Lead Channel Sensing Intrinsic Amplitude: 12 mV
Lead Channel Sensing Intrinsic Amplitude: 4.1 mV
Lead Channel Setting Pacing Amplitude: 1.125
Lead Channel Setting Pacing Amplitude: 1.625
Lead Channel Setting Pacing Pulse Width: 0.4 ms
Lead Channel Setting Sensing Sensitivity: 4 mV
Pulse Gen Model: 2240
Pulse Gen Serial Number: 7706772

## 2022-12-06 NOTE — Progress Notes (Signed)
Remote pacemaker transmission.   

## 2023-02-16 ENCOUNTER — Ambulatory Visit (INDEPENDENT_AMBULATORY_CARE_PROVIDER_SITE_OTHER): Payer: Medicare HMO

## 2023-02-16 DIAGNOSIS — I442 Atrioventricular block, complete: Secondary | ICD-10-CM

## 2023-04-04 NOTE — Progress Notes (Signed)
Remote pacemaker transmission.   

## 2023-05-18 ENCOUNTER — Ambulatory Visit (INDEPENDENT_AMBULATORY_CARE_PROVIDER_SITE_OTHER): Payer: Medicare HMO

## 2023-05-18 DIAGNOSIS — I442 Atrioventricular block, complete: Secondary | ICD-10-CM

## 2023-05-18 LAB — CUP PACEART REMOTE DEVICE CHECK
Battery Remaining Longevity: 22 mo
Battery Remaining Percentage: 17 %
Battery Voltage: 2.87 V
Brady Statistic AP VP Percent: 45 %
Brady Statistic AP VS Percent: 18 %
Brady Statistic AS VP Percent: 24 %
Brady Statistic AS VS Percent: 13 %
Brady Statistic RA Percent Paced: 63 %
Brady Statistic RV Percent Paced: 69 %
Date Time Interrogation Session: 20241107020035
Implantable Lead Connection Status: 753985
Implantable Lead Connection Status: 753985
Implantable Lead Implant Date: 20160114
Implantable Lead Implant Date: 20160114
Implantable Lead Location: 753859
Implantable Lead Location: 753860
Implantable Pulse Generator Implant Date: 20160114
Lead Channel Impedance Value: 460 Ohm
Lead Channel Impedance Value: 630 Ohm
Lead Channel Pacing Threshold Amplitude: 0.625 V
Lead Channel Pacing Threshold Amplitude: 0.875 V
Lead Channel Pacing Threshold Pulse Width: 0.4 ms
Lead Channel Pacing Threshold Pulse Width: 0.4 ms
Lead Channel Sensing Intrinsic Amplitude: 12 mV
Lead Channel Sensing Intrinsic Amplitude: 3 mV
Lead Channel Setting Pacing Amplitude: 1.125
Lead Channel Setting Pacing Amplitude: 1.625
Lead Channel Setting Pacing Pulse Width: 0.4 ms
Lead Channel Setting Sensing Sensitivity: 4 mV
Pulse Gen Model: 2240
Pulse Gen Serial Number: 7706772

## 2023-05-30 NOTE — Progress Notes (Signed)
Remote pacemaker transmission.   

## 2023-08-17 ENCOUNTER — Ambulatory Visit (INDEPENDENT_AMBULATORY_CARE_PROVIDER_SITE_OTHER): Payer: Medicare HMO

## 2023-08-17 ENCOUNTER — Other Ambulatory Visit: Payer: Self-pay | Admitting: Physical Medicine and Rehabilitation

## 2023-08-17 DIAGNOSIS — I442 Atrioventricular block, complete: Secondary | ICD-10-CM

## 2023-08-17 LAB — CUP PACEART REMOTE DEVICE CHECK
Battery Remaining Longevity: 19 mo
Battery Remaining Percentage: 15 %
Battery Voltage: 2.87 V
Brady Statistic AP VP Percent: 49 %
Brady Statistic AP VS Percent: 16 %
Brady Statistic AS VP Percent: 23 %
Brady Statistic AS VS Percent: 12 %
Brady Statistic RA Percent Paced: 65 %
Brady Statistic RV Percent Paced: 72 %
Date Time Interrogation Session: 20250206022147
Implantable Lead Connection Status: 753985
Implantable Lead Connection Status: 753985
Implantable Lead Implant Date: 20160114
Implantable Lead Implant Date: 20160114
Implantable Lead Location: 753859
Implantable Lead Location: 753860
Implantable Pulse Generator Implant Date: 20160114
Lead Channel Impedance Value: 460 Ohm
Lead Channel Impedance Value: 590 Ohm
Lead Channel Pacing Threshold Amplitude: 0.625 V
Lead Channel Pacing Threshold Amplitude: 0.625 V
Lead Channel Pacing Threshold Pulse Width: 0.4 ms
Lead Channel Pacing Threshold Pulse Width: 0.4 ms
Lead Channel Sensing Intrinsic Amplitude: 12 mV
Lead Channel Sensing Intrinsic Amplitude: 3.5 mV
Lead Channel Setting Pacing Amplitude: 0.875
Lead Channel Setting Pacing Amplitude: 1.625
Lead Channel Setting Pacing Pulse Width: 0.4 ms
Lead Channel Setting Sensing Sensitivity: 4 mV
Pulse Gen Model: 2240
Pulse Gen Serial Number: 7706772

## 2023-08-18 ENCOUNTER — Encounter: Payer: Self-pay | Admitting: Internal Medicine

## 2023-09-21 NOTE — Progress Notes (Signed)
 Remote pacemaker transmission.

## 2023-11-16 ENCOUNTER — Ambulatory Visit (INDEPENDENT_AMBULATORY_CARE_PROVIDER_SITE_OTHER): Payer: Medicare HMO

## 2023-11-16 DIAGNOSIS — I442 Atrioventricular block, complete: Secondary | ICD-10-CM | POA: Diagnosis not present

## 2023-11-16 LAB — CUP PACEART REMOTE DEVICE CHECK
Battery Remaining Longevity: 16 mo
Battery Remaining Percentage: 12 %
Battery Voltage: 2.86 V
Brady Statistic AP VP Percent: 49 %
Brady Statistic AP VS Percent: 14 %
Brady Statistic AS VP Percent: 26 %
Brady Statistic AS VS Percent: 11 %
Brady Statistic RA Percent Paced: 64 %
Brady Statistic RV Percent Paced: 75 %
Date Time Interrogation Session: 20250508020016
Implantable Lead Connection Status: 753985
Implantable Lead Connection Status: 753985
Implantable Lead Implant Date: 20160114
Implantable Lead Implant Date: 20160114
Implantable Lead Location: 753859
Implantable Lead Location: 753860
Implantable Pulse Generator Implant Date: 20160114
Lead Channel Impedance Value: 460 Ohm
Lead Channel Impedance Value: 590 Ohm
Lead Channel Pacing Threshold Amplitude: 0.5 V
Lead Channel Pacing Threshold Amplitude: 0.75 V
Lead Channel Pacing Threshold Pulse Width: 0.4 ms
Lead Channel Pacing Threshold Pulse Width: 0.4 ms
Lead Channel Sensing Intrinsic Amplitude: 12 mV
Lead Channel Sensing Intrinsic Amplitude: 3.3 mV
Lead Channel Setting Pacing Amplitude: 1 V
Lead Channel Setting Pacing Amplitude: 1.5 V
Lead Channel Setting Pacing Pulse Width: 0.4 ms
Lead Channel Setting Sensing Sensitivity: 4 mV
Pulse Gen Model: 2240
Pulse Gen Serial Number: 7706772

## 2023-11-20 ENCOUNTER — Encounter: Payer: Self-pay | Admitting: Internal Medicine

## 2023-12-25 NOTE — Progress Notes (Signed)
 Remote pacemaker transmission.

## 2024-02-15 ENCOUNTER — Ambulatory Visit: Payer: Medicare HMO

## 2024-02-15 DIAGNOSIS — I442 Atrioventricular block, complete: Secondary | ICD-10-CM | POA: Diagnosis not present

## 2024-02-15 LAB — CUP PACEART REMOTE DEVICE CHECK
Battery Remaining Longevity: 14 mo
Battery Remaining Percentage: 11 %
Battery Voltage: 2.84 V
Brady Statistic AP VP Percent: 50 %
Brady Statistic AP VS Percent: 13 %
Brady Statistic AS VP Percent: 27 %
Brady Statistic AS VS Percent: 9.8 %
Brady Statistic RA Percent Paced: 63 %
Brady Statistic RV Percent Paced: 77 %
Date Time Interrogation Session: 20250807020019
Implantable Lead Connection Status: 753985
Implantable Lead Connection Status: 753985
Implantable Lead Implant Date: 20160114
Implantable Lead Implant Date: 20160114
Implantable Lead Location: 753859
Implantable Lead Location: 753860
Implantable Pulse Generator Implant Date: 20160114
Lead Channel Impedance Value: 490 Ohm
Lead Channel Impedance Value: 600 Ohm
Lead Channel Pacing Threshold Amplitude: 0.5 V
Lead Channel Pacing Threshold Amplitude: 0.75 V
Lead Channel Pacing Threshold Pulse Width: 0.4 ms
Lead Channel Pacing Threshold Pulse Width: 0.4 ms
Lead Channel Sensing Intrinsic Amplitude: 12 mV
Lead Channel Sensing Intrinsic Amplitude: 3.4 mV
Lead Channel Setting Pacing Amplitude: 1 V
Lead Channel Setting Pacing Amplitude: 1.5 V
Lead Channel Setting Pacing Pulse Width: 0.4 ms
Lead Channel Setting Sensing Sensitivity: 4 mV
Pulse Gen Model: 2240
Pulse Gen Serial Number: 7706772

## 2024-02-17 ENCOUNTER — Ambulatory Visit: Payer: Self-pay | Admitting: Internal Medicine

## 2024-04-06 NOTE — Progress Notes (Signed)
 Remote PPM Transmission

## 2024-05-16 ENCOUNTER — Ambulatory Visit: Payer: Medicare HMO

## 2024-05-16 DIAGNOSIS — I442 Atrioventricular block, complete: Secondary | ICD-10-CM

## 2024-05-16 LAB — CUP PACEART REMOTE DEVICE CHECK
Battery Remaining Longevity: 14 mo
Battery Remaining Percentage: 11 %
Battery Voltage: 2.84 V
Brady Statistic AP VP Percent: 51 %
Brady Statistic AP VS Percent: 12 %
Brady Statistic AS VP Percent: 28 %
Brady Statistic AS VS Percent: 9 %
Brady Statistic RA Percent Paced: 63 %
Brady Statistic RV Percent Paced: 79 %
Date Time Interrogation Session: 20251106020021
Implantable Lead Connection Status: 753985
Implantable Lead Connection Status: 753985
Implantable Lead Implant Date: 20160114
Implantable Lead Implant Date: 20160114
Implantable Lead Location: 753859
Implantable Lead Location: 753860
Implantable Pulse Generator Implant Date: 20160114
Lead Channel Impedance Value: 480 Ohm
Lead Channel Impedance Value: 590 Ohm
Lead Channel Pacing Threshold Amplitude: 0.5 V
Lead Channel Pacing Threshold Amplitude: 0.875 V
Lead Channel Pacing Threshold Pulse Width: 0.4 ms
Lead Channel Pacing Threshold Pulse Width: 0.4 ms
Lead Channel Sensing Intrinsic Amplitude: 12 mV
Lead Channel Sensing Intrinsic Amplitude: 2.7 mV
Lead Channel Setting Pacing Amplitude: 1.125
Lead Channel Setting Pacing Amplitude: 1.5 V
Lead Channel Setting Pacing Pulse Width: 0.4 ms
Lead Channel Setting Sensing Sensitivity: 4 mV
Pulse Gen Model: 2240
Pulse Gen Serial Number: 7706772

## 2024-05-17 ENCOUNTER — Ambulatory Visit: Payer: Self-pay | Admitting: Internal Medicine

## 2024-05-17 NOTE — Progress Notes (Signed)
 Remote PPM Transmission

## 2024-08-15 ENCOUNTER — Ambulatory Visit: Payer: Medicare HMO

## 2024-08-16 LAB — CUP PACEART REMOTE DEVICE CHECK
Battery Remaining Longevity: 11 mo
Battery Remaining Percentage: 9 %
Battery Voltage: 2.81 V
Brady Statistic AP VP Percent: 51 %
Brady Statistic AP VS Percent: 11 %
Brady Statistic AS VP Percent: 29 %
Brady Statistic AS VS Percent: 8.3 %
Brady Statistic RA Percent Paced: 62 %
Brady Statistic RV Percent Paced: 80 %
Date Time Interrogation Session: 20260205020015
Implantable Lead Connection Status: 753985
Implantable Lead Connection Status: 753985
Implantable Lead Implant Date: 20160114
Implantable Lead Implant Date: 20160114
Implantable Lead Location: 753859
Implantable Lead Location: 753860
Implantable Pulse Generator Implant Date: 20160114
Lead Channel Impedance Value: 460 Ohm
Lead Channel Impedance Value: 590 Ohm
Lead Channel Pacing Threshold Amplitude: 0.5 V
Lead Channel Pacing Threshold Amplitude: 0.75 V
Lead Channel Pacing Threshold Pulse Width: 0.4 ms
Lead Channel Pacing Threshold Pulse Width: 0.4 ms
Lead Channel Sensing Intrinsic Amplitude: 12 mV
Lead Channel Sensing Intrinsic Amplitude: 2.5 mV
Lead Channel Setting Pacing Amplitude: 1 V
Lead Channel Setting Pacing Amplitude: 1.5 V
Lead Channel Setting Pacing Pulse Width: 0.4 ms
Lead Channel Setting Sensing Sensitivity: 4 mV
Pulse Gen Model: 2240
Pulse Gen Serial Number: 7706772
